# Patient Record
Sex: Male | Born: 1955 | Race: Black or African American | Hispanic: No | Marital: Single | Smoking: Never smoker
Health system: Southern US, Community
[De-identification: ages and names within clinical notes are randomized; demographics above are authoritative.]

## PROBLEM LIST (undated history)

## (undated) DIAGNOSIS — M1990 Unspecified osteoarthritis, unspecified site: Secondary | ICD-10-CM

## (undated) DIAGNOSIS — B9681 Helicobacter pylori [H. pylori] as the cause of diseases classified elsewhere: Secondary | ICD-10-CM

## (undated) DIAGNOSIS — IMO0001 Reserved for inherently not codable concepts without codable children: Secondary | ICD-10-CM

## (undated) DIAGNOSIS — D126 Benign neoplasm of colon, unspecified: Secondary | ICD-10-CM

## (undated) DIAGNOSIS — K2970 Gastritis, unspecified, without bleeding: Principal | ICD-10-CM

## (undated) DIAGNOSIS — I82629 Acute embolism and thrombosis of deep veins of unspecified upper extremity: Secondary | ICD-10-CM

## (undated) DIAGNOSIS — Z86718 Personal history of other venous thrombosis and embolism: Secondary | ICD-10-CM

## (undated) DIAGNOSIS — D3A00 Benign carcinoid tumor of unspecified site: Secondary | ICD-10-CM

## (undated) DIAGNOSIS — I4891 Unspecified atrial fibrillation: Secondary | ICD-10-CM

## (undated) DIAGNOSIS — I10 Essential (primary) hypertension: Secondary | ICD-10-CM

## (undated) HISTORY — DX: Acute embolism and thrombosis of deep veins of unspecified upper extremity: I82.629

## (undated) HISTORY — DX: Benign neoplasm of colon, unspecified: D12.6

## (undated) HISTORY — PX: OTHER SURGICAL HISTORY: SHX169

## (undated) HISTORY — DX: Helicobacter pylori (H. pylori) as the cause of diseases classified elsewhere: B96.81

## (undated) HISTORY — DX: Benign carcinoid tumor of unspecified site: D3A.00

## (undated) HISTORY — DX: Gastritis, unspecified, without bleeding: K29.70

---

## 1998-12-09 ENCOUNTER — Emergency Department (HOSPITAL_COMMUNITY): Admission: EM | Admit: 1998-12-09 | Discharge: 1998-12-09 | Payer: Self-pay | Admitting: Emergency Medicine

## 2002-03-26 ENCOUNTER — Ambulatory Visit (HOSPITAL_COMMUNITY): Admission: RE | Admit: 2002-03-26 | Discharge: 2002-03-26 | Payer: Self-pay | Admitting: Internal Medicine

## 2002-03-26 ENCOUNTER — Encounter: Payer: Self-pay | Admitting: Internal Medicine

## 2002-05-30 ENCOUNTER — Emergency Department (HOSPITAL_COMMUNITY): Admission: EM | Admit: 2002-05-30 | Discharge: 2002-05-30 | Payer: Self-pay

## 2002-09-08 ENCOUNTER — Ambulatory Visit (HOSPITAL_COMMUNITY): Admission: RE | Admit: 2002-09-08 | Discharge: 2002-09-08 | Payer: Self-pay | Admitting: Internal Medicine

## 2002-09-08 ENCOUNTER — Encounter: Payer: Self-pay | Admitting: Internal Medicine

## 2002-09-08 ENCOUNTER — Encounter (INDEPENDENT_AMBULATORY_CARE_PROVIDER_SITE_OTHER): Payer: Self-pay | Admitting: Specialist

## 2003-02-14 ENCOUNTER — Emergency Department (HOSPITAL_COMMUNITY): Admission: EM | Admit: 2003-02-14 | Discharge: 2003-02-14 | Payer: Self-pay | Admitting: Emergency Medicine

## 2003-02-14 ENCOUNTER — Encounter: Payer: Self-pay | Admitting: Emergency Medicine

## 2003-02-23 ENCOUNTER — Encounter: Payer: Self-pay | Admitting: Emergency Medicine

## 2003-02-23 ENCOUNTER — Emergency Department (HOSPITAL_COMMUNITY): Admission: EM | Admit: 2003-02-23 | Discharge: 2003-02-23 | Payer: Self-pay | Admitting: Emergency Medicine

## 2003-05-30 ENCOUNTER — Emergency Department (HOSPITAL_COMMUNITY): Admission: EM | Admit: 2003-05-30 | Discharge: 2003-05-30 | Payer: Self-pay | Admitting: Emergency Medicine

## 2003-06-01 ENCOUNTER — Emergency Department (HOSPITAL_COMMUNITY): Admission: EM | Admit: 2003-06-01 | Discharge: 2003-06-01 | Payer: Self-pay | Admitting: Emergency Medicine

## 2003-06-27 ENCOUNTER — Emergency Department (HOSPITAL_COMMUNITY): Admission: EM | Admit: 2003-06-27 | Discharge: 2003-06-27 | Payer: Self-pay | Admitting: Emergency Medicine

## 2003-07-01 ENCOUNTER — Emergency Department (HOSPITAL_COMMUNITY): Admission: EM | Admit: 2003-07-01 | Discharge: 2003-07-01 | Payer: Self-pay | Admitting: Emergency Medicine

## 2004-03-12 ENCOUNTER — Emergency Department (HOSPITAL_COMMUNITY): Admission: EM | Admit: 2004-03-12 | Discharge: 2004-03-13 | Payer: Self-pay | Admitting: Emergency Medicine

## 2004-04-10 ENCOUNTER — Emergency Department (HOSPITAL_COMMUNITY): Admission: EM | Admit: 2004-04-10 | Discharge: 2004-04-10 | Payer: Self-pay | Admitting: Emergency Medicine

## 2004-04-11 ENCOUNTER — Emergency Department (HOSPITAL_COMMUNITY): Admission: EM | Admit: 2004-04-11 | Discharge: 2004-04-11 | Payer: Self-pay | Admitting: Emergency Medicine

## 2004-05-09 ENCOUNTER — Inpatient Hospital Stay (HOSPITAL_COMMUNITY): Admission: EM | Admit: 2004-05-09 | Discharge: 2004-05-13 | Payer: Self-pay | Admitting: Psychiatry

## 2004-05-09 ENCOUNTER — Emergency Department (HOSPITAL_COMMUNITY): Admission: EM | Admit: 2004-05-09 | Discharge: 2004-05-09 | Payer: Self-pay | Admitting: Emergency Medicine

## 2009-10-30 HISTORY — PX: OTHER SURGICAL HISTORY: SHX169

## 2010-10-28 ENCOUNTER — Encounter
Admission: RE | Admit: 2010-10-28 | Discharge: 2010-10-28 | Payer: Self-pay | Source: Home / Self Care | Attending: Orthopedic Surgery | Admitting: Orthopedic Surgery

## 2011-01-07 ENCOUNTER — Inpatient Hospital Stay (HOSPITAL_COMMUNITY)
Admission: EM | Admit: 2011-01-07 | Discharge: 2011-01-10 | DRG: 301 | Disposition: A | Discharge status: Home / Self Care | Payer: Medicaid Other | Attending: Internal Medicine | Admitting: Internal Medicine

## 2011-01-07 ENCOUNTER — Emergency Department (HOSPITAL_COMMUNITY): Payer: Medicaid Other

## 2011-01-07 DIAGNOSIS — I82629 Acute embolism and thrombosis of deep veins of unspecified upper extremity: Principal | ICD-10-CM | POA: Diagnosis present

## 2011-01-07 DIAGNOSIS — M79609 Pain in unspecified limb: Secondary | ICD-10-CM

## 2011-01-07 DIAGNOSIS — M1990 Unspecified osteoarthritis, unspecified site: Secondary | ICD-10-CM | POA: Diagnosis present

## 2011-01-07 LAB — RAPID URINE DRUG SCREEN, HOSP PERFORMED
Benzodiazepines: NOT DETECTED
Cocaine: NOT DETECTED
Tetrahydrocannabinol: NOT DETECTED

## 2011-01-07 LAB — BASIC METABOLIC PANEL
CO2: 23 mEq/L (ref 19–32)
Chloride: 103 mEq/L (ref 96–112)
GFR calc Af Amer: >60 mL/min (ref >60)
GFR calc non Af Amer: >60 mL/min (ref >60)
Glucose, Bld: 156 mg/dL — ABNORMAL HIGH (ref 70–99)
Sodium: 136 mEq/L (ref 135–145)

## 2011-01-07 LAB — PROTIME-INR
INR: 0.95 (ref 0.00–1.49)
Prothrombin Time: 12.9 seconds (ref 11.6–15.2)

## 2011-01-07 LAB — CBC
MCH: 30.5 pg (ref 26.0–34.0)
Platelets: 239 10*3/uL (ref 150–400)
RBC: 4.72 MIL/uL (ref 4.22–5.81)
WBC: 5.3 10*3/uL (ref 4.0–10.5)

## 2011-01-07 LAB — DIFFERENTIAL
Basophils Absolute: 0 10*3/uL (ref 0.0–0.1)
Basophils Relative: 0 % (ref 0–1)
Lymphs Abs: 2.2 10*3/uL (ref 0.7–4.0)
Monocytes Absolute: 0.4 10*3/uL (ref 0.1–1.0)
Neutrophils Relative %: 49 % (ref 43–77)

## 2011-01-07 LAB — URINALYSIS, ROUTINE W REFLEX MICROSCOPIC
Glucose, UA: NEGATIVE mg/dL
Hgb urine dipstick: NEGATIVE
Nitrite: NEGATIVE
Protein, ur: NEGATIVE mg/dL
Urobilinogen, UA: 1 mg/dL (ref 0.0–1.0)

## 2011-01-07 LAB — APTT: aPTT: 27 seconds (ref 24–37)

## 2011-01-08 LAB — BASIC METABOLIC PANEL
CO2: 24 mEq/L (ref 19–32)
Calcium: 9 mg/dL (ref 8.4–10.5)
Chloride: 104 mEq/L (ref 96–112)
Creatinine, Ser: 0.8 mg/dL (ref 0.4–1.5)
GFR calc Af Amer: >60 mL/min (ref >60)

## 2011-01-08 LAB — CBC
MCV: 86.8 fL (ref 78.0–100.0)
Platelets: 230 10*3/uL (ref 150–400)
RBC: 4.54 MIL/uL (ref 4.22–5.81)
WBC: 4.2 10*3/uL (ref 4.0–10.5)

## 2011-01-08 LAB — PROTIME-INR
INR: 1.08 (ref 0.00–1.49)
Prothrombin Time: 14.2 seconds (ref 11.6–15.2)

## 2011-01-08 LAB — HOMOCYSTEINE: Homocysteine: 13.4 umol/L (ref 4.0–15.4)

## 2011-01-09 LAB — PROTEIN S ACTIVITY: Protein S Activity: 59 % — ABNORMAL LOW (ref 69–129)

## 2011-01-09 LAB — LUPUS ANTICOAGULANT PANEL
DRVVT: 36.2 secs (ref 36.2–44.3)
Lupus Anticoagulant: NOT DETECTED

## 2011-01-09 LAB — CARDIOLIPIN ANTIBODIES, IGG, IGM, IGA
Anticardiolipin IgA: 4 APL U/mL — ABNORMAL LOW (ref <22)
Anticardiolipin IgG: 4 GPL U/mL — ABNORMAL LOW (ref <23)
Anticardiolipin IgM: 2 MPL U/mL — ABNORMAL LOW (ref <11)

## 2011-01-09 LAB — BETA-2-GLYCOPROTEIN I ABS, IGG/M/A: Beta-2-Glycoprotein I IgM: 1 M Units (ref <20)

## 2011-01-09 LAB — ANTITHROMBIN III: AntiThromb III Func: 108 % (ref 76–126)

## 2011-01-09 LAB — PROTIME-INR
INR: 1.02 (ref 0.00–1.49)
Prothrombin Time: 13.6 seconds (ref 11.6–15.2)

## 2011-01-10 LAB — COMPREHENSIVE METABOLIC PANEL
AST: 47 U/L — ABNORMAL HIGH (ref 0–37)
BUN: 11 mg/dL (ref 6–23)
CO2: 26 mEq/L (ref 19–32)
Chloride: 103 mEq/L (ref 96–112)
Creatinine, Ser: 0.86 mg/dL (ref 0.4–1.5)
Glucose, Bld: 93 mg/dL (ref 70–99)
Potassium: 4.1 mEq/L (ref 3.5–5.1)
Total Bilirubin: 0.7 mg/dL (ref 0.3–1.2)

## 2011-01-10 LAB — CBC
Hemoglobin: 13.8 g/dL (ref 13.0–17.0)
MCH: 29.7 pg (ref 26.0–34.0)
RBC: 4.65 MIL/uL (ref 4.22–5.81)
WBC: 3.4 10*3/uL — ABNORMAL LOW (ref 4.0–10.5)

## 2011-01-10 LAB — DIFFERENTIAL
Basophils Relative: 0 % (ref 0–1)
Eosinophils Absolute: 0.1 10*3/uL (ref 0.0–0.7)
Neutro Abs: 1.1 10*3/uL — ABNORMAL LOW (ref 1.7–7.7)
Neutrophils Relative %: 32 % — ABNORMAL LOW (ref 43–77)

## 2011-01-10 LAB — PROTIME-INR: INR: 1.24 (ref 0.00–1.49)

## 2011-01-12 LAB — PROTEIN S, TOTAL: Protein S Ag, Total: 89 % (ref 70–140)

## 2011-01-17 NOTE — Discharge Summary (Signed)
  Dustin [name], Dustin [name]               ACCOUNT NO.:  [id number]  MEDICAL RECORD NO.:  [id number]           PATIENT TYPE:  I  LOCATION:  5504                         FACILITY:  [facility]  PHYSICIAN:  Pincus Large, MD     DATE OF BIRTH:  1956-08-25  DATE OF ADMISSION:  01/07/2011 DATE OF DISCHARGE:  01/10/2011                              DISCHARGE SUMMARY   PRIMARY CARE PHYSICIAN:  Unassigned.  REASON FOR ADMISSION:  Left arm swelling.  DISCHARGE DIAGNOSES: 1. Left upper extremity deep venous thrombosis. 2. History of osteoarthritis.  MEDICATIONS AT DISCHARGE: 1. Coumadin 2 mg daily. 2. Lovenox 95 mg subcu b.i.d. x7 days. 3. __________ daily. 4. Percocet 10/325 mg q.6 h. p.r.n.  IMAGES DONE:  Chest x-ray on January 07, 2011, shows no acute finding. Left upper extremity Doppler completed and there is an acute nonocclusive DVT noted on the left upper brachial vein.  BRIEF HOSPITAL COURSE:  Dustin [name] is a pleasant 55 year old gentleman with no significant past medical history other than just history of alcohol abuse presented to the hospital on the day of admission with left arm swelling stated he has noticed about several days ago what as an insect bite with focal pain.  Because of this, he came to the emergency room.  Denies any pain, shortness of breath, no headache. Venous Doppler shows left upper extremity DVT.  The patient does have a history of recent surgery to his left hand that was done as an outpatient.  The patient was started on Lovenox/Coumadin.  His INR is 1.24.  He is getting Lovenox 95 b.i.d.  We will send the patient on Lovenox/Coumadin to check his PT on January 13, 2011, with outside medical to decide if the patient needs more Lovenox or not.  Right now, his INR is 1.2.  PHYSICAL EXAMINATION:  VITAL SIGNS:  Temperature is 97.8, pulse of 75, respirations 20, blood pressure is 112/71, and 100% on room air. GENERAL:  He is alert, awake, and oriented x3, lying in bed,  in no acute distress. HEENT:  His head is atraumatic and normocephalic.  Eyes:  Pupils equal, round and reactive to light. HEART:  S1 and S2 normal.  No murmurs, gallops, or rubs. CHEST:  Bilateral air entry.  No wheezes or crackles. ABDOMEN:  Soft.  Bowel sounds present.  Guaiac were negative.  There is slight tenderness and swelling on the left upper extremity.  DISPOSITION:  Home.  RECOMMENDATIONS: 1. Continue with Lovenox/Coumadin. 2. Check PT/INR on January 13, 2011.  Follow up with his primary Ortho     as an outpatient basis.          ______________________________ Pincus Large, MD     SA/MEDQ  D:  01/10/2011  T:  01/11/2011  Job:  [id number]  Electronically Signed by Pincus Large MD on 01/17/2011 03:52:18 PM

## 2011-01-26 NOTE — H&P (Signed)
  Dustin [name], Dustin [name]               ACCOUNT NO.:  [id number]  MEDICAL RECORD NO.:  [id number]           PATIENT TYPE:  E  LOCATION:  [facility]                         FACILITY:  [facility]  PHYSICIAN:  Peggye Pitt, M.D. DATE OF BIRTH:  [birth year]  DATE OF ADMISSION:  01/07/2011 DATE OF DISCHARGE:                             HISTORY & PHYSICAL   PRIMARY CARE PHYSICIAN:  Unassigned.  CHIEF COMPLAINT:  Right arm swelling.  HISTORY OF PRESENT ILLNESS:  Dustin [name] is a pleasant 55 year old gentleman with no significant medical history other than prior history of cocaine and alcohol abuse, who presents to the hospital today with complaints of left arm swelling.  He states that he notices about several days ago and thought it was an insect bite.  It is quite painful and because of this, he came to the emergency department today.  He denies any chest pain, any shortness of breath, any headaches, any blurred or double vision, any nausea or vomiting, or any other symptoms.  ALLERGIES:  He has no known drug allergies.  PAST MEDICAL HISTORY:  Significant for prior history of cocaine and tobacco abuse, says he quit around 2006.  HOME MEDICATIONS:  None.  SOCIAL HISTORY:  He denies any recent tobacco, alcohol, or illicit drug use.  FAMILY HISTORY:  Not significant for coronary artery disease, cancer, strokes, heart attacks, or blood clots.  REVIEW OF SYSTEMS:  Negative except as mentioned in history of present illness.  PHYSICAL EXAMINATION:  VITAL SIGNS:  On admission, blood pressure 157/86, heart rate 86, respirations 20, sats 98% on room air, and temperature 98.3. GENERAL:  He is alert, awake, and oriented x3, in no distress. HEENT:  Normocephalic and atraumatic.  His pupils are equally round and reactive to light.  He has intact extraocular movements.  He does have some red maculas in his oropharynx. NECK:  Supple.  No JVD.  No lymphadenopathy.  No bruits.  No goiters. HEART:   Regular rate and rhythm with no murmurs, rubs, or gallops. LUNGS:  Clear to auscultation bilaterally. ABDOMEN:  Soft, nontender, and nondistended with positive bowel sounds. EXTREMITIES:  He has no edema with positive pedal pulses.  Over his right upper arm on the lateral aspect, he does have an induration that is about 5 x 8 cm. NEUROLOGIC:  Grossly intact and nonfocal.  LABORATORY DATA:  All labs and x-rays are pending at this time.  ASSESSMENT AND PLAN:  Left upper extremity deep vein thrombosis.  We will admit him to the hospital for Lovenox and Coumadin.  He will need at least 3 months of Coumadin.  I will order hypercoagulable panel, as he has no obvious risk factors for venous thromboembolism.  He has not been any recent long plane or car trips.  He will need a PCP for INR followups and we will ask case management for further assistance in this manner.     Peggye Pitt, M.D.     EH/MEDQ  D:  01/07/2011  T:  01/07/2011  Job:  [id number]  Electronically Signed by Peggye Pitt M.D. on 01/26/2011 07:47:55 AM

## 2011-03-17 NOTE — Discharge Summary (Signed)
NAME:  Dustin [name], Dustin [name]                         ACCOUNT NO.:  [id number]   MEDICAL RECORD NO.:  [id number]                   PATIENT TYPE:  IPS   LOCATION:  0303                                 FACILITY:  [facility]   PHYSICIAN:  Jeanice Lim, M.D.              DATE OF BIRTH:  02-14-1956   DATE OF ADMISSION:  05/09/2004  DATE OF DISCHARGE:  05/13/2004                                 DISCHARGE SUMMARY   IDENTIFYING DATA:  This is a 55 year old African-American male, single,  voluntarily admitted, who presented to the emergency room requesting to get  off cocaine and alcohol.  Was tremulous with auditory and visual  hallucinations and full-blown withdrawal.  Had been on a three-day binge,  using alcohol and a large amount of cocaine, and had been on a binge  pattern, often daily drinking since his 30s and recent escalation of use.  Was admitted to [facility] for two weeks in June for both  suicidal ideation and polysubstance abuse.  Had been binging on a roll for  multiple days, drinking 10-12 40-ounce beers in addition to cocaine, last  using the day prior to admission.  The longest period of sobriety was 30  days while in jail.  This is the first admission to [facility].  Again, had been in  [place] for two weeks and the second total psychiatric admission.  No  primary care physician.   MEDICATIONS:  None.   No known drug allergies.   Physical exam and neurologic exam within normal limits.   Routine admission lab studies within normal limits.   MENTAL STATUS EXAM:  Fully alert, pleasant, cooperative, with good eye  contact and no psychomotor abnormality.  Speech soft-spoken.  Mood euthymic.  Affect full without __________, goal-directed.  Cognition was intact.  No  psychotic symptoms or dangerous ideation.  Cognitively, again, intact, and  appeared to be candid about substance use and motivation to get treatment.   ADMISSION DIAGNOSES:   AXIS I:  1. Alcohol dependence  and cocaine dependence in binge pattern.  2. Partial withdrawal syndrome.   AXIS II:  Deferred.   AXIS III:  None.   AXIS IV:  Moderate stressors to severe, recent homelessness or unclear  living situation, other psychosocial issues.   AXIS V:  29/55-60.   The patient was admitted and ordered routine care and medications and  withdrawal monitoring.  He was encouraged to participate in individual,  group, and milieu therapy.  He was placed on a Librium detox protocol,  Symmetrel for cocaine cravings.  Thyroid was monitored and elevation of  liver enzymes likely from alcoholic hepatitis were followed, as well as the  patient was encouraged to participate in substance abuse therapy and develop  a relapse prevention plan in addition to other aftercare planning.  Once he  was safely detoxed after close monitoring, the patient's __________ was  monitored and he required p.r.n.  medications.  He admitted to being out of  control and felt like killing himself prior to admission.  He did complain  of cravings as well as withdrawal symptoms, and gradually suicidal ideation  as well as withdrawal symptoms and cravings improved.  The patient was  discharged in improved condition with mood euthymic, no acute withdrawal  symptoms, increased insight and judgment, motivated to remain abstinent,  having developed an adequate relapse prevention plan, participating in the  aftercare planning.  He was given medication education, again discharged  with no dangerous ideation or psychotic symptoms and no acute withdrawal  symptoms or side effects from the medications.  After medication education  reviewed, he was discharged on trazodone 100 mg one q.h.s., and amantadine  100 mg one b.i.d.  These medications were to be re-evaluated by follow-up  physician after a further period of sobriety.  He was to follow up with the  [facility] and seek all substance abuse treatment resources  available in  this area.   DISCHARGE DIAGNOSES:   AXIS I:  1. Alcohol dependence and cocaine dependence in binge pattern.  2. Partial withdrawal syndrome.   AXIS II:  Deferred.   AXIS III:  None.   AXIS IV:  Moderate stressors to severe, recent homelessness or unclear  living situation, other psychosocial issues.   AXIS V:  GAF on discharge:  50-55.                                               Jeanice Lim, M.D.    JEM/MEDQ  D:  06/15/2004  T:  06/17/2004  Job:  [id number]

## 2011-09-06 ENCOUNTER — Emergency Department (HOSPITAL_COMMUNITY)
Admission: EM | Admit: 2011-09-06 | Discharge: 2011-09-06 | Disposition: A | Discharge status: Home / Self Care | Payer: Medicare Other | Attending: Emergency Medicine | Admitting: Emergency Medicine

## 2011-09-06 DIAGNOSIS — M7989 Other specified soft tissue disorders: Secondary | ICD-10-CM | POA: Insufficient documentation

## 2011-09-06 DIAGNOSIS — L0291 Cutaneous abscess, unspecified: Secondary | ICD-10-CM

## 2011-09-06 DIAGNOSIS — IMO0002 Reserved for concepts with insufficient information to code with codable children: Secondary | ICD-10-CM | POA: Insufficient documentation

## 2011-09-06 DIAGNOSIS — M25429 Effusion, unspecified elbow: Secondary | ICD-10-CM | POA: Insufficient documentation

## 2011-09-06 DIAGNOSIS — M79609 Pain in unspecified limb: Secondary | ICD-10-CM | POA: Insufficient documentation

## 2011-09-06 DIAGNOSIS — M129 Arthropathy, unspecified: Secondary | ICD-10-CM | POA: Insufficient documentation

## 2011-09-06 HISTORY — DX: Unspecified osteoarthritis, unspecified site: M19.90

## 2011-09-06 HISTORY — DX: Personal history of other venous thrombosis and embolism: Z86.718

## 2011-09-06 MED ORDER — OXYCODONE-ACETAMINOPHEN 5-325 MG PO TABS
1.0000 | ORAL_TABLET | Freq: Once | ORAL | Status: AC
  Administered 2011-09-06: 1 via ORAL
  Filled 2011-09-06: qty 1

## 2011-09-06 MED ORDER — SULFAMETHOXAZOLE-TRIMETHOPRIM 800-160 MG PO TABS: 1.0000 | ORAL_TABLET | Freq: Two times a day (BID) | ORAL | Status: AC

## 2011-09-06 MED ORDER — OXYCODONE-ACETAMINOPHEN 5-325 MG PO TABS: 1.0000 | ORAL_TABLET | ORAL | Status: AC | PRN

## 2011-09-06 NOTE — ED Notes (Signed)
Pa  At bedside to I&D abcess

## 2011-09-06 NOTE — ED Provider Notes (Signed)
Medical screening examination/treatment/procedure(s) were performed by non-physician practitioner and as supervising physician I was immediately available for consultation/collaboration.  Doug Sou, MD 09/06/11 2019

## 2011-09-06 NOTE — ED Provider Notes (Signed)
History     CSN: [id number] Arrival date & time: 09/06/2011 12:49 PM   First MD Initiated Contact with Patient 09/06/11 1418      Chief Complaint  Patient presents with  . Abscess    abcess of the rt. elbow    (Consider location/radiation/quality/duration/timing/severity/associated sxs/prior treatment) Patient is a 55 y.o. male presenting with abscess. The history is provided by the patient.  Abscess  This is a new problem. The current episode started yesterday. The onset was gradual. The abscess is present on the left arm. The problem is moderate. The abscess is characterized by swelling. Pertinent negatives include no fever.    Past Medical History  Diagnosis Date  . History of blood clots   . Arthritis     History reviewed. No pertinent past surgical history.  History reviewed. No pertinent family history.  History  Substance Use Topics  . Smoking status: Former Games developer  . Smokeless tobacco: Not on file  . Alcohol Use: No      Review of Systems  Constitutional: Negative for fever.  Skin:       See HPI.    Allergies  Review of patient's allergies indicates no known allergies.  Home Medications  No current outpatient prescriptions on file.  BP 137/86  Pulse 103  Temp 98.3 F (36.8 C)  Resp 16  SpO2 97%  Physical Exam  Constitutional: He appears well-developed and well-nourished.  Musculoskeletal:       Left proximal forearm swollen posteriorly, red, tender. No ulceration or llesion.     ED Course  INCISION AND DRAINAGE Date/Time: 09/06/2011 3:46 PM Performed by: Langley Adie A Authorized by: Langley Adie A Consent: Verbal consent obtained. Consent given by: patient Patient understanding: patient states understanding of the procedure being performed Site marked: the operative site was marked Imaging studies: imaging studies not available Patient identity confirmed: verbally with patient Time out: Immediately prior to procedure a "time out"  was called to verify the correct patient, procedure, equipment, support staff and site/side marked as required. Type: abscess Body area: upper extremity Location details: left arm Anesthesia: local infiltration Local anesthetic: lidocaine 1% without epinephrine Scalpel size: 11 Incision type: single straight Complexity: simple Drainage: bloody Drainage amount: scant Wound treatment: wound left open Packing material: 1/4 in iodoform gauze Patient tolerance: Patient tolerated the procedure well with no immediate complications.   (including critical care time)  Labs Reviewed - No data to display No results found.   No diagnosis found.    MDM          Rodena Medin, PA 09/06/11 [phone]

## 2011-09-06 NOTE — ED Notes (Signed)
Pt states noticed 2 days ago had pain, redness and swelling to left posterior proximal forearm....area is approx 10 X 10 cm..   No visible drainage....  Appears to have a "bite/tiny puncture" wound in middle of area...   Area marked and dated      Area is hot and  indurated

## 2011-09-08 ENCOUNTER — Emergency Department (INDEPENDENT_AMBULATORY_CARE_PROVIDER_SITE_OTHER)
Admission: EM | Admit: 2011-09-08 | Discharge: 2011-09-08 | Disposition: A | Discharge status: Home / Self Care | Payer: Medicare Other | Source: Home / Self Care

## 2011-09-08 ENCOUNTER — Encounter (HOSPITAL_COMMUNITY): Payer: Self-pay | Admitting: Cardiology

## 2011-09-08 DIAGNOSIS — L0291 Cutaneous abscess, unspecified: Secondary | ICD-10-CM

## 2011-09-08 DIAGNOSIS — L0390 Cellulitis, unspecified: Secondary | ICD-10-CM

## 2011-09-08 MED ORDER — MUPIROCIN CALCIUM 2 % EX CREA: TOPICAL_CREAM | Freq: Three times a day (TID) | CUTANEOUS | Status: AC

## 2011-09-08 MED ORDER — LIDOCAINE HCL (PF) 1 % IJ SOLN
INTRAMUSCULAR | Status: AC
  Administered 2011-09-08: 12:00:00 via INTRAMUSCULAR
  Filled 2011-09-08: qty 5

## 2011-09-08 MED ORDER — CEFTRIAXONE SODIUM 1 G IJ SOLR
INTRAMUSCULAR | Status: AC
  Filled 2011-09-08: qty 10

## 2011-09-08 MED ORDER — CEFTRIAXONE SODIUM 1 G IJ SOLR
1.0000 g | Freq: Once | INTRAMUSCULAR | Status: AC
  Administered 2011-09-08: 1 g via INTRAMUSCULAR

## 2011-09-08 NOTE — ED Provider Notes (Signed)
History     CSN: [id number] Arrival date & time: 09/08/2011  9:57 AM   First MD Initiated Contact with Patient 09/08/11 1110      Chief Complaint  Patient presents with  . Wound Check    packing removal    (Consider location/radiation/quality/duration/timing/severity/associated sxs/prior treatment) Patient is a 55 y.o. male presenting with abscess. The history is provided by the patient.  Abscess  This is a new problem. Episode onset: Patient had I&D at [facility] ED on 11/07 hast taken 3 pills of septra,  no cultures pending, reports pain and  swelling better but redness worse and outside dermographic markings, no fever or chills. Progression since onset: minimally improved. The abscess is present on the left arm. The problem is moderate. Pertinent negatives include no fever.    Past Medical History  Diagnosis Date  . History of blood clots     to left arm  . Arthritis     History reviewed. No pertinent past surgical history.  Family History  Problem Relation Age of Onset  . Diabetes Other   . Hypertension Other     History  Substance Use Topics  . Smoking status: Former Games developer  . Smokeless tobacco: Not on file  . Alcohol Use: No      Review of Systems  Constitutional: Negative for fever and chills.  Skin: Positive for wound.       Erythema, tenderness and purulent discharge.    Allergies  Review of patient's allergies indicates no known allergies.  Home Medications   Current Outpatient Rx  Name Route Sig Dispense Refill  . OXYCODONE-ACETAMINOPHEN 5-325 MG PO TABS Oral Take 1 tablet by mouth every 4 (four) hours as needed for pain. 15 tablet 0  . SULFAMETHOXAZOLE-TRIMETHOPRIM 800-160 MG PO TABS Oral Take 1 tablet by mouth every 12 (twelve) hours. 20 tablet 0  . MUPIROCIN CALCIUM 2 % EX CREA Topical Apply topically 3 (three) times daily. 15 g 0    BP 144/78  Pulse 80  Temp(Src) 99 F (37.2 C) (Oral)  Resp 16  SpO2 99%  Physical Exam  Nursing note and  vitals reviewed. Constitutional: He is oriented to person, place, and time. He appears well-developed and well-nourished. No distress.  Cardiovascular: Normal rate and regular rhythm.   Neurological: He is alert and oriented to person, place, and time.  Skin:       There is an abscess with surrounding cellulitis s/p I&D in left lateral forearm proximal to the elbow joint.The I&D incision is very small <16mm and appears insufficient there is still purulent drainage and tenderness to palpation, erythema around is flat blanchable with no induration of the skin but  has surpassed the dermographic pen markings done in prior visit; although appears fading and does no have tracking.  Adjacent elbow joint has FROM with no reported tenderness and there is no obvious joint effusion or swelling.      ED Course  INCISION AND DRAINAGE Date/Time: 09/08/2011 12:23 PM Performed by: Sharin Grave Authorized by: Sharin Grave Consent: Verbal consent obtained. Risks and benefits: risks, benefits and alternatives were discussed Consent given by: patient Patient understanding: patient states understanding of the procedure being performed Patient consent: the patient's understanding of the procedure matches consent given Patient identity confirmed: verbally with patient Type: abscess Body area: upper extremity Location details: left arm Anesthesia: local infiltration Local anesthetic: lidocaine 1% without epinephrine Anesthetic total: 2 ml Patient sedated: no Scalpel size: 11 Incision type: extension of prior incition to  1.5cm and eliptic removal of borders to avoid fast closure. Complexity: simple Drainage: purulent Drainage amount: moderate Wound treatment: wound left open Packing material: sterile dressing with neomycin ointment applyed. Patient tolerance: Patient tolerated the procedure well with no immediate complications.   (including critical care time)   Labs Reviewed  WOUND  CULTURE   No results found.   1. Abscess and cellulitis       MDM  Patient had a second I&D with extension of prior incision, pus drainage, culture although patient on second day of septra. Rocephin 1g IM  Was administered asked to continue Septra and return in 24-48 hours the sooner if increased redness, swelling drainage or pain, or elbow joint pain or swelling.        Abeni Finchum Moreno-Coll 09/09/11 4098

## 2011-09-08 NOTE — ED Notes (Signed)
Pt returns for wound check. He did change his dressing yesterday wit packing removal. Pt reports wound to be painful and redness has gone past the mark placed on the arm on this past Tuesday. Wound noted to have yellow drainage also.

## 2011-09-10 ENCOUNTER — Emergency Department (HOSPITAL_COMMUNITY)
Admission: EM | Admit: 2011-09-10 | Discharge: 2011-09-10 | Disposition: A | Discharge status: Other Acute Inpatient Hospital | Payer: Medicare Other | Source: Home / Self Care

## 2011-09-10 NOTE — ED Notes (Signed)
Patient came in for abscess recheck.  Patient was advised of the wait time by registration staff and became very agitated.  Patient advised we would be more than happy to see him but we couldn't make him stay.  Patient yelled he did not have to be here repeatedly and left the building

## 2011-09-11 LAB — WOUND CULTURE

## 2011-09-12 ENCOUNTER — Telehealth (HOSPITAL_COMMUNITY): Payer: Self-pay | Admitting: *Deleted

## 2011-09-12 NOTE — ED Notes (Signed)
Chart and lab reviewed. Pt. pos. for MRSA and was adequately treated with Sulfa. Vassie Moselle 09/12/2011

## 2011-10-06 ENCOUNTER — Encounter (HOSPITAL_COMMUNITY): Payer: Self-pay | Admitting: Pharmacy Technician

## 2011-10-11 ENCOUNTER — Other Ambulatory Visit: Payer: Self-pay

## 2011-10-11 ENCOUNTER — Encounter (HOSPITAL_COMMUNITY)
Admission: RE | Admit: 2011-10-11 | Discharge: 2011-10-11 | Disposition: A | Discharge status: Home / Self Care | Payer: Medicare Other | Source: Ambulatory Visit | Attending: Orthopedic Surgery | Admitting: Orthopedic Surgery

## 2011-10-11 ENCOUNTER — Encounter (HOSPITAL_COMMUNITY): Payer: Self-pay

## 2011-10-11 LAB — URINALYSIS, ROUTINE W REFLEX MICROSCOPIC
Leukocytes, UA: NEGATIVE
Nitrite: NEGATIVE
Specific Gravity, Urine: 1.031 — ABNORMAL HIGH (ref 1.005–1.030)
Urobilinogen, UA: 1 mg/dL (ref 0.0–1.0)
pH: 5.5 (ref 5.0–8.0)

## 2011-10-11 LAB — COMPREHENSIVE METABOLIC PANEL
ALT: 44 U/L (ref 0–53)
Albumin: 4.3 g/dL (ref 3.5–5.2)
Alkaline Phosphatase: 68 U/L (ref 39–117)
BUN: 26 mg/dL — ABNORMAL HIGH (ref 6–23)
Chloride: 103 mEq/L (ref 96–112)
Potassium: 4.2 mEq/L (ref 3.5–5.1)
Sodium: 139 mEq/L (ref 135–145)
Total Bilirubin: 1.1 mg/dL (ref 0.3–1.2)
Total Protein: 8.3 g/dL (ref 6.0–8.3)

## 2011-10-11 LAB — CBC
HCT: 44 % (ref 39.0–52.0)
Hemoglobin: 15.1 g/dL (ref 13.0–17.0)
MCHC: 34.3 g/dL (ref 30.0–36.0)
RDW: 13.4 % (ref 11.5–15.5)
WBC: 4.5 10*3/uL (ref 4.0–10.5)

## 2011-10-11 LAB — PROTIME-INR: Prothrombin Time: 13.7 seconds (ref 11.6–15.2)

## 2011-10-11 LAB — TYPE AND SCREEN: Antibody Screen: NEGATIVE

## 2011-10-11 LAB — SURGICAL PCR SCREEN
MRSA, PCR: POSITIVE — AB
Staphylococcus aureus: POSITIVE — AB

## 2011-10-11 LAB — ABO/RH: ABO/RH(D): A POS

## 2011-10-11 LAB — APTT: aPTT: 28 seconds (ref 24–37)

## 2011-10-11 NOTE — Pre-Procedure Instructions (Signed)
20 TREYSEAN PETRUZZI  10/11/2011   Your procedure is scheduled on: Thurs,Dec 20 @ 0730  Report to [facility] at 0530 AM.  Call this number if you have problems the morning of surgery: [phone]   Remember:   Do not eat food:After Midnight.  May have clear liquids: up to 4 Hours before arrival.(until 1:30am)  Clear liquids include soda, tea, black coffee, apple or grape juice, broth.  Take these medicines the morning of surgery with A SIP OF WATER: Pain Pill(if needed)   Do not wear jewelry, make-up or nail polish.  Do not wear lotions, powders, or perfumes. You may wear deodorant.  Do not shave 48 hours prior to surgery.  Do not bring valuables to the hospital.  Contacts, dentures or bridgework may not be worn into surgery.  Leave suitcase in the car. After surgery it may be brought to your room.  For patients admitted to the hospital, checkout time is 11:00 AM the day of discharge.   Patients discharged the day of surgery will not be allowed to drive home.  Name and phone number of your driver:   Special Instructions: CHG Shower Use Special Wash: 1/2 bottle night before surgery and 1/2 bottle morning of surgery.   Please read over the following fact sheets that you were given: Pain Booklet, Coughing and Deep Breathing, Blood Transfusion Information, Total Joint Packet, MRSA Information and Surgical Site Infection Prevention

## 2011-10-13 ENCOUNTER — Encounter (HOSPITAL_COMMUNITY): Payer: Self-pay | Admitting: Vascular Surgery

## 2011-10-13 NOTE — Consult Note (Signed)
Anesthesia:  Patient is a 55 year old male scheduled for a left TKA on 10/19/11.  His history includes a non-occlusive left brachial vein DVT in March of this year.  This was following an out-patient left hand surgery.  He is also a former smoker and has a history of cocaine and ETOH abuse, but quit in 2005.  His labs are acceptable for the OR from an Anesthesia standpoint.  His CXR showed no acute process.  I was asked to review his preoperative EKG showing NSR with possible prior septal infarct and ST abnormality in the inferior leads.  Probably non-specific ST changes in lateral leads V4-6.  These changes are somewhat more apparent than on his EKG from 01/07/11, but still seem overall stable.   I called Dustin [name].  He was a somewhat poor historian.  He sees a PCP at [facility] in [place].   He reports he has already completed anticoagulation therapy for his LUE DVT.  His prior hand surgery to remove a mass earlier this year was done at a clinic on [address]. He denies CP, SOB, palpitations, and LE edema other than around his knees.  He has no known hx of CAD/MI or DM.  He denies history of HTN although his BP was 149/87 at PAT.  He is not particularly active secondary to knee pain.  He has had no known prior cardiac studies.  I reviewed above, including his last two EKGs, with Dr. Jean Rosenthal.  Plan to proceed if remains asymptomatic.

## 2011-10-18 MED ORDER — CEFAZOLIN SODIUM-DEXTROSE 2-3 GM-% IV SOLR
2.0000 g | INTRAVENOUS | Status: AC
  Administered 2011-10-19: 2 g via INTRAVENOUS
  Filled 2011-10-18: qty 50

## 2011-10-19 ENCOUNTER — Encounter (HOSPITAL_COMMUNITY): Payer: Self-pay | Admitting: Vascular Surgery

## 2011-10-19 ENCOUNTER — Encounter (HOSPITAL_COMMUNITY): Payer: Self-pay | Admitting: *Deleted

## 2011-10-19 ENCOUNTER — Encounter (HOSPITAL_COMMUNITY)
Admission: RE | Disposition: A | Discharge status: Home Health | Payer: Self-pay | Source: Ambulatory Visit | Attending: Orthopedic Surgery

## 2011-10-19 ENCOUNTER — Inpatient Hospital Stay (HOSPITAL_COMMUNITY)
Admission: RE | Admit: 2011-10-19 | Discharge: 2011-10-23 | DRG: 470 | Disposition: A | Discharge status: Home Health | Payer: Medicare Other | Source: Ambulatory Visit | Attending: Orthopedic Surgery | Admitting: Orthopedic Surgery

## 2011-10-19 ENCOUNTER — Encounter (HOSPITAL_COMMUNITY): Payer: Self-pay

## 2011-10-19 ENCOUNTER — Inpatient Hospital Stay (HOSPITAL_COMMUNITY): Payer: Medicare Other | Admitting: Vascular Surgery

## 2011-10-19 DIAGNOSIS — M1710 Unilateral primary osteoarthritis, unspecified knee: Principal | ICD-10-CM | POA: Diagnosis present

## 2011-10-19 DIAGNOSIS — Z01812 Encounter for preprocedural laboratory examination: Secondary | ICD-10-CM

## 2011-10-19 DIAGNOSIS — Z96659 Presence of unspecified artificial knee joint: Secondary | ICD-10-CM

## 2011-10-19 HISTORY — PX: TOTAL KNEE ARTHROPLASTY: SHX125

## 2011-10-19 SURGERY — ARTHROPLASTY, KNEE, TOTAL
Anesthesia: General | Site: Knee | Laterality: Left | Wound class: Clean

## 2011-10-19 MED ORDER — PROPOFOL 10 MG/ML IV EMUL
INTRAVENOUS | Status: DC | PRN
  Administered 2011-10-19: 130 mg via INTRAVENOUS

## 2011-10-19 MED ORDER — HYDROMORPHONE 0.3 MG/ML IV SOLN
INTRAVENOUS | Status: AC
  Administered 2011-10-19: 0.799 mg via INTRAVENOUS
  Administered 2011-10-19: 0.6 mg via INTRAVENOUS
  Administered 2011-10-19: 0.3 mg via INTRAVENOUS
  Administered 2011-10-20: 0.799 mg via INTRAVENOUS
  Administered 2011-10-20: 0.599 mg via INTRAVENOUS
  Administered 2011-10-20: 18:00:00 via INTRAVENOUS
  Administered 2011-10-20: 0.599 mg via INTRAVENOUS
  Administered 2011-10-20: 1.19 mg via INTRAVENOUS
  Administered 2011-10-20: 1.79 mg via INTRAVENOUS
  Administered 2011-10-20: 0.799 mg via INTRAVENOUS
  Administered 2011-10-20: 1.39 mg via INTRAVENOUS
  Administered 2011-10-21: 1.19 mg via INTRAVENOUS
  Administered 2011-10-21: 2.19 mg via INTRAVENOUS
  Filled 2011-10-19 (×2): qty 25

## 2011-10-19 MED ORDER — SODIUM CHLORIDE 0.9 % IJ SOLN: 9.0000 mL | INTRAMUSCULAR | Status: DC | PRN

## 2011-10-19 MED ORDER — METOCLOPRAMIDE HCL 5 MG PO TABS
5.0000 mg | ORAL_TABLET | Freq: Three times a day (TID) | ORAL | Status: DC | PRN
  Filled 2011-10-19: qty 2

## 2011-10-19 MED ORDER — WARFARIN VIDEO
Freq: Once | Status: AC
  Administered 2011-10-19: 15:00:00

## 2011-10-19 MED ORDER — PROMETHAZINE HCL 25 MG/ML IJ SOLN: 6.2500 mg | INTRAMUSCULAR | Status: DC | PRN

## 2011-10-19 MED ORDER — ACETAMINOPHEN 650 MG RE SUPP: 650.0000 mg | Freq: Four times a day (QID) | RECTAL | Status: DC | PRN

## 2011-10-19 MED ORDER — BUPIVACAINE-EPINEPHRINE PF 0.5-1:200000 % IJ SOLN
INTRAMUSCULAR | Status: DC | PRN
  Administered 2011-10-19: 30 mL

## 2011-10-19 MED ORDER — METOCLOPRAMIDE HCL 5 MG/ML IJ SOLN
5.0000 mg | Freq: Three times a day (TID) | INTRAMUSCULAR | Status: DC | PRN
  Filled 2011-10-19: qty 2

## 2011-10-19 MED ORDER — LACTATED RINGERS IV SOLN
INTRAVENOUS | Status: DC | PRN
  Administered 2011-10-19 (×2): via INTRAVENOUS

## 2011-10-19 MED ORDER — SUFENTANIL CITRATE 50 MCG/ML IV SOLN
INTRAVENOUS | Status: DC | PRN
  Administered 2011-10-19 (×4): 10 ug via INTRAVENOUS
  Administered 2011-10-19: 30 ug via INTRAVENOUS

## 2011-10-19 MED ORDER — ONDANSETRON HCL 4 MG/2ML IJ SOLN: 4.0000 mg | Freq: Four times a day (QID) | INTRAMUSCULAR | Status: DC | PRN

## 2011-10-19 MED ORDER — NALOXONE HCL 0.4 MG/ML IJ SOLN: 0.4000 mg | INTRAMUSCULAR | Status: DC | PRN

## 2011-10-19 MED ORDER — ACETAMINOPHEN 325 MG PO TABS
650.0000 mg | ORAL_TABLET | Freq: Four times a day (QID) | ORAL | Status: DC | PRN
  Administered 2011-10-20: 650 mg via ORAL
  Filled 2011-10-19: qty 2

## 2011-10-19 MED ORDER — DROPERIDOL 2.5 MG/ML IJ SOLN
INTRAMUSCULAR | Status: DC | PRN
  Administered 2011-10-19: 0.625 mg via INTRAVENOUS

## 2011-10-19 MED ORDER — MEPERIDINE HCL 25 MG/ML IJ SOLN: 6.2500 mg | INTRAMUSCULAR | Status: DC | PRN

## 2011-10-19 MED ORDER — DIPHENHYDRAMINE HCL 50 MG/ML IJ SOLN: 12.5000 mg | Freq: Four times a day (QID) | INTRAMUSCULAR | Status: DC | PRN

## 2011-10-19 MED ORDER — ONDANSETRON HCL 4 MG PO TABS: 4.0000 mg | ORAL_TABLET | Freq: Four times a day (QID) | ORAL | Status: DC | PRN

## 2011-10-19 MED ORDER — ONDANSETRON HCL 4 MG/2ML IJ SOLN
INTRAMUSCULAR | Status: DC | PRN
  Administered 2011-10-19: 4 mg via INTRAVENOUS

## 2011-10-19 MED ORDER — MIDAZOLAM HCL 5 MG/5ML IJ SOLN
INTRAMUSCULAR | Status: DC | PRN
  Administered 2011-10-19: 2 mg via INTRAVENOUS

## 2011-10-19 MED ORDER — SODIUM CHLORIDE 0.9 % IR SOLN
Status: DC | PRN
  Administered 2011-10-19: 3000 mL

## 2011-10-19 MED ORDER — MENTHOL 3 MG MT LOZG: 1.0000 | LOZENGE | OROMUCOSAL | Status: DC | PRN

## 2011-10-19 MED ORDER — PATIENT'S GUIDE TO USING COUMADIN BOOK
Freq: Once | Status: AC
  Administered 2011-10-19: 15:00:00
  Filled 2011-10-19: qty 1

## 2011-10-19 MED ORDER — DEXTROSE-NACL 5-0.45 % IV SOLN
INTRAVENOUS | Status: AC
  Administered 2011-10-19 – 2011-10-20 (×2): via INTRAVENOUS

## 2011-10-19 MED ORDER — PHENOL 1.4 % MT LIQD
1.0000 | OROMUCOSAL | Status: DC | PRN
  Filled 2011-10-19: qty 177

## 2011-10-19 MED ORDER — CEFAZOLIN SODIUM 1-5 GM-% IV SOLN
1.0000 g | Freq: Four times a day (QID) | INTRAVENOUS | Status: AC
  Administered 2011-10-19 – 2011-10-20 (×3): 1 g via INTRAVENOUS
  Filled 2011-10-19 (×3): qty 50

## 2011-10-19 MED ORDER — DIPHENHYDRAMINE HCL 12.5 MG/5ML PO ELIX
12.5000 mg | ORAL_SOLUTION | Freq: Four times a day (QID) | ORAL | Status: DC | PRN
  Filled 2011-10-19: qty 5

## 2011-10-19 MED ORDER — WARFARIN SODIUM 10 MG PO TABS
10.0000 mg | ORAL_TABLET | Freq: Once | ORAL | Status: AC
  Administered 2011-10-19: 10 mg via ORAL
  Filled 2011-10-19: qty 1

## 2011-10-19 MED ORDER — ROCURONIUM BROMIDE 100 MG/10ML IV SOLN
INTRAVENOUS | Status: DC | PRN
  Administered 2011-10-19: 50 mg via INTRAVENOUS

## 2011-10-19 MED ORDER — ACETAMINOPHEN 10 MG/ML IV SOLN
INTRAVENOUS | Status: AC
  Filled 2011-10-19: qty 100

## 2011-10-19 MED ORDER — ACETAMINOPHEN 10 MG/ML IV SOLN
INTRAVENOUS | Status: DC | PRN
  Administered 2011-10-19: 1000 mg via INTRAVENOUS

## 2011-10-19 MED ORDER — HYDROMORPHONE HCL PF 1 MG/ML IJ SOLN
INTRAMUSCULAR | Status: AC
  Filled 2011-10-19: qty 1

## 2011-10-19 MED ORDER — DEXAMETHASONE SODIUM PHOSPHATE 4 MG/ML IJ SOLN
INTRAMUSCULAR | Status: DC | PRN
  Administered 2011-10-19: 4 mg via INTRAVENOUS

## 2011-10-19 MED ORDER — HYDROMORPHONE HCL PF 1 MG/ML IJ SOLN
0.2500 mg | INTRAMUSCULAR | Status: DC | PRN
  Administered 2011-10-19 (×6): 0.5 mg via INTRAVENOUS

## 2011-10-19 SURGICAL SUPPLY — 49 items
BANDAGE ELASTIC 4 VELCRO ST LF (GAUZE/BANDAGES/DRESSINGS) ×2 IMPLANT
BANDAGE ESMARK 6X9 LF (GAUZE/BANDAGES/DRESSINGS) ×1 IMPLANT
BANDAGE GAUZE ELAST BULKY 4 IN (GAUZE/BANDAGES/DRESSINGS) ×2 IMPLANT
BLADE SAGITTAL 25.0X1.27X90 (BLADE) ×2 IMPLANT
BNDG CMPR 9X6 STRL LF SNTH (GAUZE/BANDAGES/DRESSINGS) ×1
BNDG CMPR MED 10X6 ELC LF (GAUZE/BANDAGES/DRESSINGS) ×1
BNDG ELASTIC 6X10 VLCR STRL LF (GAUZE/BANDAGES/DRESSINGS) ×2 IMPLANT
BNDG ESMARK 6X9 LF (GAUZE/BANDAGES/DRESSINGS) ×2
BONE CEMENT PALACOSE (Orthopedic Implant) ×4 IMPLANT
BOWL SMART MIX CTS (DISPOSABLE) ×2 IMPLANT
CEMENT BONE PALACOSE (Orthopedic Implant) ×2 IMPLANT
CLOTH BEACON ORANGE TIMEOUT ST (SAFETY) ×2 IMPLANT
COVER SURGICAL LIGHT HANDLE (MISCELLANEOUS) ×3 IMPLANT
CUFF TOURNIQUET SINGLE 34IN LL (TOURNIQUET CUFF) ×1 IMPLANT
CUFF TOURNIQUET SINGLE 44IN (TOURNIQUET CUFF) IMPLANT
DRAPE EXTREMITY T 121X128X90 (DRAPE) ×2 IMPLANT
DRAPE U-SHAPE 47X51 STRL (DRAPES) ×2 IMPLANT
DRSG ADAPTIC 3X8 NADH LF (GAUZE/BANDAGES/DRESSINGS) ×2 IMPLANT
DRSG PAD ABDOMINAL 8X10 ST (GAUZE/BANDAGES/DRESSINGS) ×2 IMPLANT
DURAPREP 26ML APPLICATOR (WOUND CARE) ×2 IMPLANT
ELECT REM PT RETURN 9FT ADLT (ELECTROSURGICAL) ×2
ELECTRODE REM PT RTRN 9FT ADLT (ELECTROSURGICAL) ×1 IMPLANT
EVACUATOR 3/16  PVC DRAIN (DRAIN)
EVACUATOR 3/16 PVC DRAIN (DRAIN) IMPLANT
FACESHIELD LNG OPTICON STERILE (SAFETY) ×2 IMPLANT
GLOVE SS PI 9.0 STRL (GLOVE) ×4 IMPLANT
GOWN PREVENTION PLUS XLARGE (GOWN DISPOSABLE) ×3 IMPLANT
GOWN STRL NON-REIN LRG LVL3 (GOWN DISPOSABLE) ×5 IMPLANT
HANDPIECE INTERPULSE COAX TIP (DISPOSABLE) ×2
IMMOBILIZER KNEE 20 (SOFTGOODS)
IMMOBILIZER KNEE 20 THIGH 36 (SOFTGOODS) IMPLANT
IMMOBILIZER KNEE 22 UNIV (SOFTGOODS) ×2 IMPLANT
IMMOBILIZER KNEE 24 THIGH 36 (MISCELLANEOUS) IMPLANT
IMMOBILIZER KNEE 24 UNIV (MISCELLANEOUS)
KIT BASIN OR (CUSTOM PROCEDURE TRAY) ×2 IMPLANT
KIT ROOM TURNOVER OR (KITS) ×2 IMPLANT
MANIFOLD NEPTUNE II (INSTRUMENTS) ×2 IMPLANT
NS IRRIG 1000ML POUR BTL (IV SOLUTION) ×2 IMPLANT
PACK TOTAL JOINT (CUSTOM PROCEDURE TRAY) ×2 IMPLANT
PAD ARMBOARD 7.5X6 YLW CONV (MISCELLANEOUS) ×4 IMPLANT
SET HNDPC FAN SPRY TIP SCT (DISPOSABLE) ×1 IMPLANT
SPONGE GAUZE 4X4 12PLY (GAUZE/BANDAGES/DRESSINGS) ×2 IMPLANT
STAPLER VISISTAT 35W (STAPLE) ×2 IMPLANT
SUT VIC AB 0 CTB1 27 (SUTURE) ×6 IMPLANT
SUT VIC AB 2-0 CTB1 (SUTURE) ×6 IMPLANT
TOWEL OR 17X24 6PK STRL BLUE (TOWEL DISPOSABLE) ×2 IMPLANT
TOWEL OR 17X26 10 PK STRL BLUE (TOWEL DISPOSABLE) ×2 IMPLANT
TRAY FOLEY CATH 14FR (SET/KITS/TRAYS/PACK) ×2 IMPLANT
WATER STERILE IRR 1000ML POUR (IV SOLUTION) ×4 IMPLANT

## 2011-10-19 NOTE — Anesthesia Preprocedure Evaluation (Signed)
Anesthesia Evaluation  Patient identified by MRN, date of birth, ID band Patient awake    Reviewed: Allergy & Precautions, H&P , NPO status , Patient's Chart, lab work & pertinent test results  Airway       Dental  (+) Partial Upper and Teeth Intact   Pulmonary neg pulmonary ROS,          Cardiovascular neg cardio ROS     Neuro/Psych Negative Neurological ROS  Negative Psych ROS   GI/Hepatic negative GI ROS, Neg liver ROS,   Endo/Other  Negative Endocrine ROS  Renal/GU negative Renal ROS  Genitourinary negative   Musculoskeletal   Abdominal   Peds  Hematology negative hematology ROS (+)   Anesthesia Other Findings   Reproductive/Obstetrics                           Anesthesia Physical Anesthesia Plan  ASA: I  Anesthesia Plan: General ETT   Post-op Pain Management: MAC Combined w/ Regional for Post-op pain   Induction:   Airway Management Planned:   Additional Equipment:   Intra-op Plan:   Post-operative Plan:   Informed Consent: I have reviewed the patients History and Physical, chart, labs and discussed the procedure including the risks, benefits and alternatives for the proposed anesthesia with the patient or authorized representative who has indicated his/her understanding and acceptance.   Dental Advisory Given  Plan Discussed with: CRNA and Surgeon  Anesthesia Plan Comments:         Anesthesia Quick Evaluation

## 2011-10-19 NOTE — H&P (Signed)
H&p note written and dictated report [id number].

## 2011-10-19 NOTE — Plan of Care (Signed)
Problem: Consults Goal: Diagnosis- Total Joint Replacement Outcome: Progressing Primary Total Knee Left

## 2011-10-19 NOTE — Brief Op Note (Signed)
10/19/2011  10:11 AM  PATIENT:  Dustin [name]  55 y.o. male  PRE-OPERATIVE DIAGNOSIS:  Degenerative joint disease, left knee  POST-OPERATIVE DIAGNOSIS:  Degenerative joint disease, left knee  PROCEDURE:  Procedure(s): TOTAL KNEE ARTHROPLASTY  SURGEON:  Surgeon(s): Kennieth Rad  PHYSICIAN ASSISTANT:   ASSISTANTS: none   ANESTHESIA:   general  EBL:  Total I/O In: 1000 [I.V.:1000] Out: 200 [Urine:200]  BLOOD ADMINISTERED:none  DRAINS: none   LOCAL MEDICATIONS USED:  NONE  SPECIMEN:  No Specimen  DISPOSITION OF SPECIMEN:  N/A  COUNTS:  YES  TOURNIQUET:   Total Tourniquet Time Documented: Thigh (Left) - 102 minutes  DICTATION: .Other Dictation: Dictation Number [id number] REPORT # [id number]  PLAN OF CARE: Admit to inpatient ADMIT TO 5000  PATIENT DISPOSITION:  PACU - hemodynamically stable. PACU  Delay start of Pharmacological VTE agent (>24hrs) due to surgical blood loss or risk of bleeding:  {YES/NO/NOT APPLICABLE:20182NO

## 2011-10-19 NOTE — Op Note (Signed)
Pre-op  Diagnosis: DJD LEFT KNEE POST-OP DIAGNOSIS: SAME. ANESTHESIA: gen. Procedure: LEFT TOTAL KNEE ARTHROPLASTY ) BIOMET)  BLOOD LOSS:150CC. WENT TO RECOVERY ROOM IN STABLE CONDITION.

## 2011-10-19 NOTE — Anesthesia Procedure Notes (Signed)
Anesthesia Regional Block:  Femoral nerve block  Pre-Anesthetic Checklist: ,, timeout performed, Correct Patient, Correct Site, Correct Laterality, Correct Procedure, Correct Position, site marked, Risks and benefits discussed, pre-op evaluation,  At surgeon's request and post-op pain management  Laterality: Left  Prep: Maximum Sterile Barrier Precautions used and Betadine       Needles:  Injection technique: Single-shot  Needle Type: Other   (Arrow 50mm)    Needle Gauge: 22 and 22 G    Additional Needles:  Procedures: nerve stimulator Femoral nerve block  Nerve Stimulator or Paresthesia:  Response: Patellar respose, 0.4 mA,   Additional Responses:   Narrative:  Start time: 10/19/2011 7:05 AM End time: 10/19/2011 7:15 AM Injection made incrementally with aspirations every 5 mL. Anesthesiologist: Mang Hazelrigg,MD  Additional Notes: 2% Lidocaine skin wheel.   Femoral nerve block

## 2011-10-19 NOTE — Anesthesia Postprocedure Evaluation (Signed)
  Anesthesia Post-op Note  Patient: Dustin [name]  Procedure(s) Performed:  TOTAL KNEE ARTHROPLASTY  Patient Location: PACU  Anesthesia Type: GA combined with regional for post-op pain  Level of Consciousness: awake  Airway and Oxygen Therapy: Patient Spontanous Breathing and Patient connected to nasal cannula oxygen  Post-op Pain: moderate  Post-op Assessment: Post-op Vital signs reviewed, Patient's Cardiovascular Status Stable, Respiratory Function Stable and Patent Airway  Post-op Vital Signs: Reviewed and stable  Complications: No apparent anesthesia complications

## 2011-10-19 NOTE — Consult Note (Signed)
ANTICOAGULATION CONSULT NOTE - Initial Consult  Pharmacy Consult for: Coumadin Indication: VTE px s/p L TKA  No Known Allergies  Vital Signs: Temp: 96.9 F (36.1 C) (12/20 1012) Temp src: Oral (12/20 0616) BP: 143/90 mmHg (12/20 1242) Pulse Rate: 74  (12/20 1300)  Labs: No results found for this basename: HGB:2,HCT:3,PLT:3,APTT:3,LABPROT:3,INR:3,HEPARINUNFRC:3,CREATININE:3,CKTOTAL:3,CKMB:3,TROPONINI:3 in the last 72 hours CrCl is unknown because there is no height on file for the current visit.  Medical History: Past Medical History  Diagnosis Date  . History of blood clots     to left arm  . Arthritis     bil knees, left hand  . DVT of upper extremity (deep vein thrombosis)     left brachial vein, 12/2010    Medications:  Prescriptions prior to admission  Medication Sig Dispense Refill  . etodolac (LODINE) 400 MG tablet Take 400 mg by mouth 2 (two) times daily.        Marland Kitchen oxyCODONE-acetaminophen (PERCOCET) 5-325 MG per tablet Take 1 tablet by mouth every 4 (four) hours as needed. For pain        Assessment: 55yom to begin coumadin for VTE px s/p L TKA. Of note, patient had a LUE DVT in March of this year but is not on anticoagulation PTA. Baseline INR 1.03. Coumadin score=9.  Goal of Therapy:  INR 2-3   Plan:  1) Coumadin 10mg  x 1 2) Daily INR 3) Coumadin education-book/video  Fredrik Rigger 10/19/2011,2:17 PM

## 2011-10-19 NOTE — Transfer of Care (Signed)
Immediate Anesthesia Transfer of Care Note  Patient: Dustin [name]  Procedure(s) Performed:  TOTAL KNEE ARTHROPLASTY  Patient Location: PACU  Anesthesia Type: General and GA combined with regional for post-op pain  Level of Consciousness: awake, oriented, sedated, patient cooperative and responds to stimulation  Airway & Oxygen Therapy: Patient Spontanous Breathing  Post-op Assessment: Report given to PACU RN, Post -op Vital signs reviewed and stable and Patient moving all extremities  Post vital signs: Reviewed and stable  Complications: No apparent anesthesia complications

## 2011-10-19 NOTE — Progress Notes (Signed)
C.C.-PAINFUL LEFT KNEE HPI- THIS IS A55 Y/0 MALE TREATED FOR DJD OF THE LEFT KNEE. PATIENT HAS BEEN TREATED WITH BRACING, ANTI-INFLAMMATORY MED. AND PAIN MEDS WITHOUT RELIEF. PMH- BLOOD CLOT IN UPPER EXTREMETY, DJD, MEDICATIONS- LODINE, PERCOCET ALLERGIES-NONE-KNOWN.FAM.HISTORY- NON-CONTRIBUTORY. P.EXAM-BP-142/82,P-82 R.R.-18,TEMP.97.6, HEAD- NORMOCEPHALIC ,NECK-SUPPLE, CHEST-LUNGS CLEAR  ABD. -SOFT. EXTREMETIES- LEFT KNEE WITH GENU--VARUM, LIMITED ROM, TENDER MED AND LAT COMPARTMENTS, CREPITUS OVER MED AND LAT COMPARTMENTS. X-RAY,S REVEAL LOSS OF JOINT SPACE ,SCLEROSIS , AND SPURS OVER BOTH COMPARTMENTS. IMPRESSION-SEVERE DJD LEFT KNEE  PLAN- LEFT TOTAL KNEE ARTHROPLASTY.

## 2011-10-20 ENCOUNTER — Encounter (HOSPITAL_COMMUNITY): Payer: Self-pay | Admitting: Orthopedic Surgery

## 2011-10-20 LAB — CBC
MCH: 28.6 pg (ref 26.0–34.0)
MCHC: 32.2 g/dL (ref 30.0–36.0)
MCV: 89 fL (ref 78.0–100.0)
Platelets: 231 10*3/uL (ref 150–400)

## 2011-10-20 LAB — PROTIME-INR: Prothrombin Time: 15.2 seconds (ref 11.6–15.2)

## 2011-10-20 MED ORDER — OXYCODONE-ACETAMINOPHEN 5-325 MG PO TABS
1.0000 | ORAL_TABLET | ORAL | Status: DC | PRN
  Administered 2011-10-21 – 2011-10-23 (×13): 2 via ORAL
  Filled 2011-10-20 (×13): qty 2

## 2011-10-20 MED ORDER — SENNOSIDES-DOCUSATE SODIUM 8.6-50 MG PO TABS: 1.0000 | ORAL_TABLET | Freq: Every evening | ORAL | Status: DC | PRN

## 2011-10-20 MED ORDER — SODIUM CHLORIDE 0.9 % IJ SOLN
3.0000 mL | Freq: Two times a day (BID) | INTRAMUSCULAR | Status: DC
  Administered 2011-10-21 – 2011-10-23 (×4): 3 mL via INTRAVENOUS

## 2011-10-20 MED ORDER — METHOCARBAMOL 500 MG PO TABS
500.0000 mg | ORAL_TABLET | Freq: Four times a day (QID) | ORAL | Status: DC | PRN
  Administered 2011-10-21 – 2011-10-23 (×7): 500 mg via ORAL
  Filled 2011-10-20 (×7): qty 1

## 2011-10-20 MED ORDER — ZOLPIDEM TARTRATE 5 MG PO TABS: 5.0000 mg | ORAL_TABLET | Freq: Every evening | ORAL | Status: DC | PRN

## 2011-10-20 MED ORDER — WARFARIN SODIUM 10 MG PO TABS
10.0000 mg | ORAL_TABLET | Freq: Once | ORAL | Status: AC
  Administered 2011-10-20: 10 mg via ORAL
  Filled 2011-10-20: qty 1

## 2011-10-20 MED ORDER — SODIUM CHLORIDE 0.9 % IJ SOLN: 3.0000 mL | Freq: Two times a day (BID) | INTRAMUSCULAR | Status: DC

## 2011-10-20 MED ORDER — DOCUSATE SODIUM 100 MG PO CAPS
100.0000 mg | ORAL_CAPSULE | Freq: Two times a day (BID) | ORAL | Status: DC
  Administered 2011-10-21 – 2011-10-23 (×6): 100 mg via ORAL
  Filled 2011-10-20 (×7): qty 1

## 2011-10-20 MED ORDER — BISACODYL 10 MG RE SUPP
10.0000 mg | Freq: Every day | RECTAL | Status: DC | PRN
Start: 2011-10-20 — End: 2011-10-23

## 2011-10-20 NOTE — Plan of Care (Signed)
Problem: Consults Goal: Diagnosis- Total Joint Replacement Primary Total Knee     

## 2011-10-20 NOTE — Progress Notes (Signed)
Physical Therapy Evaluation Patient Details Name: Dustin [name] MRN: [id number] DOB: 01-Nov-1955 Today's Date: 10/20/2011  Problem List: There is no problem list on file for this patient.   Past Medical History:  Past Medical History  Diagnosis Date  . History of blood clots     to left arm  . Arthritis     bil knees, left hand  . DVT of upper extremity (deep vein thrombosis)     left brachial vein, 12/2010   Past Surgical History:  Past Surgical History  Procedure Date  . Left  hand  2011    cyst removed.  . Total knee arthroplasty 10/19/2011    Procedure: TOTAL KNEE ARTHROPLASTY;  Surgeon: Kennieth Rad;  Location: [facility] OR;  Service: Orthopedics;  Laterality: Left;    PT Assessment/Plan/Recommendation PT Assessment Clinical Impression Statement: Pt presents with a medical diagnosis of Left TKA along with the following impairments/deficits and therapy diagnosis listed below. Pt will benefit from skilled PT in the acute care setting in order to maximize functional mobility for a safe d/c home. PT Recommendation/Assessment: Patient will need skilled PT in the acute care venue PT Problem List: Decreased strength;Decreased range of motion;Decreased activity tolerance;Decreased mobility;Decreased knowledge of use of DME;Decreased knowledge of precautions;Pain PT Therapy Diagnosis : Difficulty walking;Acute pain PT Plan PT Frequency: 7X/week PT Treatment/Interventions: DME instruction;Gait training;Stair training;Functional mobility training;Therapeutic activities;Therapeutic exercise;Patient/family education PT Recommendation Follow Up Recommendations: Home health PT;24 hour supervision/assistance Equipment Recommended: Rolling walker with 5" wheels;3 in 1 bedside comode;Tub/shower bench PT Goals  Acute Rehab PT Goals PT Goal Formulation: With patient Time For Goal Achievement: 7 days Pt will go Supine/Side to Sit: with modified independence PT Goal: Supine/Side to Sit -  Progress: Progressing toward goal Pt will go Sit to Supine/Side: with modified independence PT Goal: Sit to Supine/Side - Progress: Progressing toward goal Pt will go Sit to Stand: with modified independence PT Goal: Sit to Stand - Progress: Progressing toward goal Pt will go Stand to Sit: with modified independence PT Goal: Stand to Sit - Progress: Progressing toward goal Pt will Transfer Bed to Chair/Chair to Bed: with modified independence PT Transfer Goal: Bed to Chair/Chair to Bed - Progress: Progressing toward goal Pt will Ambulate: >150 feet;with supervision;with rolling walker PT Goal: Ambulate - Progress: Progressing toward goal Pt will Perform Home Exercise Program: Independently PT Goal: Perform Home Exercise Program - Progress: Progressing toward goal  PT Evaluation Precautions/Restrictions  Precautions Precautions: Knee Required Braces or Orthoses: Yes Restrictions Weight Bearing Restrictions: Yes LLE Weight Bearing: Weight bearing as tolerated Prior Functioning  Home Living Lives With: Alone Receives Help From: Family Type of Home: Apartment Home Layout: One level Home Access: Level entry;Elevator (elevator to 5th floor) Bathroom Shower/Tub: Tub/shower unit;Curtain Bathroom Toilet: Handicapped height Bathroom Accessibility: Yes How Accessible: Accessible via walker Home Adaptive Equipment: None Prior Function Level of Independence: Independent with basic ADLs;Independent with homemaking with ambulation;Independent with gait;Independent with transfers Able to Take Stairs?: Yes Driving: No Vocation: On disability Cognition Cognition Arousal/Alertness: Awake/alert Overall Cognitive Status: Appears within functional limits for tasks assessed Orientation Level: Oriented X4 Sensation/Coordination Sensation Light Touch: Appears Intact Stereognosis: Appears Intact Hot/Cold: Appears Intact Proprioception: Appears Intact Coordination Gross Motor Movements are  Fluid and Coordinated: Yes Fine Motor Movements are Fluid and Coordinated: Yes Extremity Assessment RLE Assessment RLE Assessment: Within Functional Limits LLE Assessment LLE Assessment: Exceptions to WFL LLE AROM (degrees) Overall AROM Left Lower Extremity: Deficits;Due to pain (Hip and Ankle WFL; Knee 0-70 degrees) LLE  Strength LLE Overall Strength: Deficits;Due to pain (Hip and Ankle WFL; Pt able to complete SLR with min assist) Mobility (including Balance) Bed Mobility Bed Mobility: Yes Supine to Sit: 4: Min assist;With rails;HOB elevated (Comment degrees) (30) Supine to Sit Details (indicate cue type and reason): VC for sequencing and hand placement Sitting - Scoot to Edge of Bed: 5: Supervision;With rail Sitting - Scoot to Edge of Bed Details (indicate cue type and reason): VC for hand placement Transfers Transfers: Yes Sit to Stand: 4: Min assist;With upper extremity assist;From bed Sit to Stand Details (indicate cue type and reason): VC for hand placement and safety to RW. Min assist for stability into standing Stand to Sit: 4: Min assist;To chair/3-in-1;With upper extremity assist Stand to Sit Details: VC for hand placement. Pt able to control descent Ambulation/Gait Ambulation/Gait: Yes Ambulation/Gait Assistance: 4: Min assist (Minguard assist) Ambulation/Gait Assistance Details (indicate cue type and reason): VC for proper sequencing and safety to RW  Ambulation Distance (Feet): 75 Feet Assistive device: Rolling walker Gait Pattern: Step-to pattern;Decreased step length - right;Decreased stance time - left;Decreased hip/knee flexion - left;Trunk flexed Gait velocity: Decreased gait speed Stairs: No Wheelchair Mobility Wheelchair Mobility: No    Exercise  Total Joint Exercises Ankle Circles/Pumps: AROM;Strengthening;Both;10 reps;Supine Quad Sets: AROM;Left;Strengthening;10 reps;Supine Heel Slides: AAROM;Strengthening;Left;10 reps;Seated Straight Leg Raises:  AAROM;Strengthening;Left;10 reps;Supine Long Arc Quad: AAROM;Strengthening;Left;10 reps;Seated End of Session PT - End of Session Equipment Utilized During Treatment: Gait belt;Left knee immobilizer Activity Tolerance: Patient tolerated treatment well Patient left: in chair;with call bell in reach Nurse Communication: Mobility status for transfers;Mobility status for ambulation General Behavior During Session: [facility] for tasks performed Cognition: [facility] for tasks performed  Milana Kidney 10/20/2011, 1:35 PM  10/20/2011 Milana Kidney DPT PAGER: [phone] OFFICE: [phone]

## 2011-10-20 NOTE — Progress Notes (Signed)
ANTICOAGULATION CONSULT NOTE - Follow Up Consult  Pharmacy Consult for: Coumadin Indication: VTE px s/p L TKA  No Known Allergies  Vital Signs: Temp: 98.8 F (37.1 C) (12/21 0603) Temp src: Oral (12/21 0603) BP: 149/82 mmHg (12/21 0603) Pulse Rate: 92  (12/21 0603)  Labs:  Basename 10/20/11 0500  HGB 12.0*  HCT 37.3*  PLT 231  APTT --  LABPROT 15.2  INR 1.18  HEPARINUNFRC --  CREATININE --  CKTOTAL --  CKMB --  TROPONINI --   Assessment: 55yom on coumadin for VTE px s/p L TKA. INR of 1.18 below goal after first dose of coumadin as expected.   Goal of Therapy:  INR 2-3   Plan:  1) Repeat coumadin 10mg  x 1 2) Follow up INR in AM  Dustin [name] 10/20/2011,9:06 AM

## 2011-10-20 NOTE — Progress Notes (Signed)
Temp 100.5 dressing is dry ,less pain plan d/c pca in am. Tolerated PT well .ENCOURAGE USE OF SPIROMETRY, AND INCREASE FLUID INTAKE.
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
## 2011-10-20 NOTE — Progress Notes (Signed)
Occupational Therapy Evaluation Patient Details Name: Dustin [name] MRN: [id number] DOB: 1956-05-03 Today's Date: 10/20/2011 Time:1040 - 1130 50 min Pain - 5/10. Pain pump  Problem List: There is no problem list on file for this patient.   Past Medical History:  Past Medical History  Diagnosis Date  . History of blood clots     to left arm  . Arthritis     bil knees, left hand  . DVT of upper extremity (deep vein thrombosis)     left brachial vein, 12/2010   Past Surgical History:  Past Surgical History  Procedure Date  . Left  hand  2011    cyst removed.    OT Assessment/Plan/Recommendation OT Assessment -  PAINFUL LEFT KNEE HPI- THIS IS A55 Y/0 MALE TREATED FOR DJD OF THE LEFT KNEE. PATIENT HAS BEEN TREATED WITH BRACING, ANTI-INFLAMMATORY MED. AND PAIN MEDS WITHOUT RELIEF IMPRESSION-SEVERE DJD LEFT KNEE  PLAN- LEFT TOTAL KNEE ARTHROPLASTY.      OT Recommendation/Assessment: Patient will need skilled OT in the acute care venue OT Problem List: Decreased range of motion;Decreased knowledge of use of DME or AE Barriers to Discharge: None OT Therapy Diagnosis : Acute pain OT Plan OT Frequency: Min 2X/week OT Treatment/Interventions: Self-care/ADL training;DME and/or AE instruction;Therapeutic activities;Patient/family education OT Recommendation Follow Up Recommendations: Home health OT Equipment Recommended: 3 in 1 bedside comode;Tub/shower bench;Rolling walker with 5" wheels Individuals Consulted Consulted and Agree with Results and Recommendations: Patient OT Goals  See navigator. Mod I with LB B/D, toilet and tub bench transfer with Dustin [name] DME and AE.  OT Evaluation Precautions/Restrictions  Precautions Precautions: Knee Required Braces or Orthoses: Yes Restrictions Weight Bearing Restrictions: Yes LLE Weight Bearing: Weight bearing as tolerated Prior Functioning Home Living Lives With: Alone Receives Help From: Family;Friend(s) Type of Home:  Apartment Home Layout: One level Home Access: Level entry;Elevator (elevator to 5th floor) Bathroom Shower/Tub: Tub/shower unit;Curtain Bathroom Toilet: Handicapped height Bathroom Accessibility: Yes How Accessible: Accessible via walker Home Adaptive Equipment: None Prior Function Level of Independence: Independent with basic ADLs;Independent with homemaking with ambulation Able to Take Stairs?: Yes Driving: No Vocation: On disability ADL see navigator  Vision/Perception  Vision - History Baseline Vision: No visual deficits Cognition Cognition Arousal/Alertness: Awake/alert Overall Cognitive Status: Appears within functional limits for tasks assessed Orientation Level: Oriented X4 Sensation/Coordination Sensation Light Touch: Appears Intact Stereognosis: Appears Intact Hot/Cold: Appears Intact Proprioception: Appears Intact Coordination Gross Motor Movements are Fluid and Coordinated: Yes Fine Motor Movements are Fluid and Coordinated: Yes Mobility  - steady A   End of Session OT - End of Session Equipment Utilized During Treatment: Gait belt;Left knee immobilizer Activity Tolerance: Patient tolerated treatment well Patient left: in chair;with call bell in reach General Behavior During Session: Dustin [name] for tasks performed Cognition: [name] for tasks performed  Skilled Intervention: focus on ADL retrainnig with Dustin [name] AE. Pt will need additional training and will benefit from HHOT.   Dustin [name],Dustin [name] 10/20/2011, 11:57 AM

## 2011-10-20 NOTE — Op Note (Signed)
NAMEREVANTH, NEIDIG               ACCOUNT NO.:  [id number]  MEDICAL RECORD NO.:  [id number]  LOCATION:  5011                         FACILITY:  [facility]  PHYSICIAN:  Myrtie Neither, MD      DATE OF BIRTH:  [birth year]  DATE OF PROCEDURE: DATE OF DISCHARGE:                              OPERATIVE REPORT   PREOPERATIVE DIAGNOSIS:  Degenerative joint disease, left knee.  Varus deformity.  POSTOPERATIVE DIAGNOSIS:  Degenerative joint disease, left knee.  Varus deformity.  ANESTHESIA:  General.  PROCEDURE:  Left total knee arthroplasty, Biomet implant.  The patient was taken to the operating room.  After given adequate preop medications, given general anesthesia and intubated.  Left knee was prepped with DuraPrep and draped in sterile manner.  Tourniquet and Bovie used for hemostasis.  Anterior midline incision made over the left knee going through the tibial tuberosity to the quadriceps.  Sharp and blunt dissection made medially.  A medial paramedian incision made into the capsule extending from the tibial tuberosity to the quadriceps. Patella was reflected laterally.  Osteophytes about the patella, femur, and tibia were resected.  Soft tissue resection was done.  Medial release was also done due to varus deformity.  This allowed balancing of the knee with bringing the knee into neutral to slight valgus position. Then, with the knee in the flexed position, tibial cutting jig was then put in place.  A 10-mm tibial surface was resected.  Further soft tissue and osteophytes posteriorly were resected.  Next, reaming down the femoral canal was then done followed by placement of distal femoral cutting jig.  After the distal femoral cut was made, sizing of the femur was found to be 72.5.  Appropriate cutting jig 72.5 for the femur was done was put in place.  Anterior and posterior cuts as well as chamfering cuts were made.  Trial femur was put in place and found to be a very good snug fit.   Attention was then turned back to the tibia. Osteophytes were resected.  Sizing of the proximal tibia was that of 83 mm.  Trial femur as well as tibial trial were put in place with a 10-mm poly.  This allowed full extension, full flexion, and good medial and lateral stability.  The knee was then taken back into flexed position. Trial implants were removed.  Appropriate proximal tibial cutting jig was put in place and appropriate cuts mad.  Trials were put back.  10 mm was found to have full extension, full flexion, and good medial and lateral stability.  Sizing of the patella was found to be 37 mm. Appropriate cutting jig was put in place, and appropriate cut was made for the patella for single pay.  With all 3 components put in place, had full extension, full flexion, good medial and lateral stability, good tracking of the patella.  Trial implants were removed.  Copious irrigation was done of the femur, tibia, and patella. Methylmethacrylate was then mixed.  The patella and the tibial components were cemented.  The femoral component was press fitted. After setting of the cement, trial of 10 mm put in again; full extension, full flexion, and good medial and lateral stability  with good tracking of the patella.  A 12 mm was tried and was found to be too tight.  Final components in place was a large patella, 37 mm; 72.5 trial femoral press-fit component; 83-mm tibial surface with a 10 mm x 83 mm bearing surface.  The final bearing surface was locked in place. Copious irrigation and hemostasis obtained.  Wound closure was then done with 0 Vicryl for the fascia, 2-0 for the subcutaneous, and skin staples for the skin. Compressive dressing was applied, and knee immobilizer applied.  The patient tolerated the procedure quite well, went to recovery room in stable and satisfactory condition.     Myrtie Neither, MD     AC/MEDQ  D:  10/19/2011  T:  10/20/2011  Job:  [id number]

## 2011-10-20 NOTE — Progress Notes (Signed)
Social Work order in place; met with patient regarding discharge disposition. He lives alone in a senior apartment called [address]. He states that he plans to return home- he is ADAMANT that he will not go into a SNF for rehab and that his family will help him at home. He has an 55 year old daughter and supportive family. Wants home health for physical therapy. Discussed with patient- he chooses [facility] for [facility] and DME.  Notified RNCM Vance Peper.  No Social Work needs identified. Social worker signing off.  Darylene Price, BSW, 10/20/2011 9:09 AM

## 2011-10-21 LAB — CBC
MCH: 28.9 pg (ref 26.0–34.0)
MCHC: 32.6 g/dL (ref 30.0–36.0)
MCV: 88.5 fL (ref 78.0–100.0)
Platelets: 212 10*3/uL (ref 150–400)

## 2011-10-21 LAB — PROTIME-INR: Prothrombin Time: 17.7 seconds — ABNORMAL HIGH (ref 11.6–15.2)

## 2011-10-21 MED ORDER — WARFARIN SODIUM 10 MG PO TABS
10.0000 mg | ORAL_TABLET | Freq: Once | ORAL | Status: AC
  Administered 2011-10-21: 10 mg via ORAL
  Filled 2011-10-21 (×2): qty 1

## 2011-10-21 NOTE — Progress Notes (Signed)
AFEBRILE, PAIN UNDER CONTROL, TOLERATED PT WELL, NO BM YET, H&H IS STABLE. INR-1.73 DRESSING IS DRY. WILL NEED HH AND PT ARRANGED FOR DISCHARGE ON Monday.

## 2011-10-21 NOTE — Progress Notes (Signed)
ANTICOAGULATION CONSULT NOTE - Follow Up Consult  Pharmacy Consult for: Coumadin Indication: VTE px s/p L TKA  No Known Allergies  Vital Signs: Temp: 98.5 F (36.9 C) (12/22 0603) BP: 143/67 mmHg (12/22 0603) Pulse Rate: 90  (12/22 0603) Wt 98.1 kg on 10/11/2011 (prior to this adm)  Labs:  Basename 10/21/11 0600 10/20/11 0500  HGB 10.3* 12.0*  HCT 31.6* 37.3*  PLT 212 231  APTT -- --  LABPROT 17.7* 15.2  INR 1.43 1.18  HEPARINUNFRC -- --  CREATININE -- --  CKTOTAL -- --  CKMB -- --  TROPONINI -- --   Assessment: 55 yo M on coumadin for VTE px s/p L TKA. INR of 1.43 below goal but increasing towards goal as expected after two doses of coumadin. Hgb trending toward, plt stable, no bleeding noted.   Goal of Therapy:  INR 2-3   Plan:  1) Repeat coumadin 10 mg PO x 1 2) Follow up INR in AM  Maudry Mayhew N 10/21/2011,7:46 AM

## 2011-10-21 NOTE — Progress Notes (Signed)
Occupational Therapy Treatment Patient Details Name: TRIP CAVANAGH MRN: [id number] DOB: 06-Mar-1956 Today's Date: 10/21/2011  OT Assessment/Plan OT Assessment/Plan Comments on Treatment Session: Pt. moving well and requests HH therapies at D/C home to continue to advance function with ADLs. OT Plan: Discharge plan remains appropriate OT Frequency: Min 2X/week Follow Up Recommendations: Home health OT Equipment Recommended: Rolling walker with 5" wheels;3 in 1 bedside comode;Tub/shower bench OT Goals Acute Rehab OT Goals OT Goal Formulation: With patient Time For Goal Achievement: 2 weeks ADL Goals Pt Will Perform Lower Body Bathing: with modified independence;Sit to stand from chair ADL Goal: Lower Body Bathing - Progress: Not addressed Pt Will Perform Lower Body Dressing: with modified independence;Sit to stand from chair ADL Goal: Lower Body Dressing - Progress: Not addressed Pt Will Transfer to Toilet: with modified independence;with DME;3-in-1 ADL Goal: Toilet Transfer - Progress: Not addressed Pt Will Perform Tub/Shower Transfer: Tub transfer;Transfer tub bench;Grab bars;with DME ADL Goal: Tub/Shower Transfer - Progress: Progressing toward goals  OT Treatment Precautions/Restrictions  Precautions Precautions: Knee Required Braces or Orthoses: Yes Knee Immobilizer: On except when in CPM Restrictions Weight Bearing Restrictions: Yes LLE Weight Bearing: Partial weight bearing LLE Partial Weight Bearing Percentage or Pounds: 50   ADL ADL Tub/Shower Transfer: Simulated;Moderate assistance Tub/Shower Transfer Details (indicate cue type and reason): Pt. provided with mod verbal cues for hand placement and technique with hand placement on RW and back of tub bench. Pt. provided with assist to elevate left LE over wall of tub. Tub/Shower Transfer Method: Science writer: Counsellor Used: Other (comment) (tub transfer bench) ADL  Comments: Pt. ambulated ~10' with RW to tub transfer bench to simulate transfer. Ptated on use of AE for LB dressing and bathing to increase functional independence with ADLs. Mobility  Bed Mobility Bed Mobility: No Transfers Transfers: Yes Sit to Stand: 5: Supervision;With armrests;From chair/3-in-1 Sit to Stand Details (indicate cue type and reason): Min verbal cues for hand placement on RW and armrests to increase safety. Exercises    End of Session OT - End of Session Equipment Utilized During Treatment: Gait belt;Left knee immobilizer Activity Tolerance: Patient tolerated treatment well Patient left: in chair;with call bell in reach General Behavior During Session: [facility] for tasks performed Cognition: [facility] for tasks performed  Cassandria Anger, OTR/L Pager [phone]  10/21/2011, 8:47 AM

## 2011-10-21 NOTE — Progress Notes (Signed)
Physical Therapy Treatment Patient Details Name: Dustin [name] MRN: [id number] DOB: [birth year] Today's Date: 10/21/2011  PT Assessment/Plan  PT - Assessment/Plan Comments on Treatment Session: Pt progressing well since eval yesterday; he was able to ambulate at a greater distance as well as perform all mobility with less assistance.  (Pt safe to go home) PT Plan: Discharge plan remains appropriate PT Frequency: 7X/week Follow Up Recommendations: Home health PT;24 hour supervision/assistance Equipment Recommended: Rolling walker with 5" wheels;3 in 1 bedside comode;Tub/shower bench PT Goals  Acute Rehab PT Goals PT Goal Formulation: With patient PT Goal: Supine/Side to Sit - Progress: Progressing toward goal PT Goal: Sit to Supine/Side - Progress: Progressing toward goal PT Goal: Sit to Stand - Progress: Progressing toward goal PT Goal: Stand to Sit - Progress: Progressing toward goal PT Transfer Goal: Bed to Chair/Chair to Bed - Progress: Progressing toward goal PT Goal: Ambulate - Progress: Met PT Goal: Perform Home Exercise Program - Progress: Progressing toward goal  PT Treatment Precautions/Restrictions  Precautions Precautions: Knee Required Braces or Orthoses: Yes Knee Immobilizer: On except when in CPM Restrictions Weight Bearing Restrictions: Yes LLE Weight Bearing: Partial weight bearing LLE Partial Weight Bearing Percentage or Pounds: 50 Mobility (including Balance) Bed Mobility Bed Mobility: No Transfers Transfers: Yes Sit to Stand: 5: Supervision;With armrests;From chair/3-in-1 Sit to Stand Details (indicate cue type and reason): VC for hand placement for safety to RW Stand to Sit: 5: Supervision;With upper extremity assist;To chair/3-in-1 Stand to Sit Details: VC for hand placement Ambulation/Gait Ambulation/Gait: Yes Ambulation/Gait Assistance: 5: Supervision Ambulation/Gait Assistance Details (indicate cue type and reason): VC for increased heel strike as  well as postural cues throughout. Ambulation Distance (Feet): 200 Feet Assistive device: Rolling walker Gait Pattern: Step-to pattern;Decreased step length - right;Decreased stance time - left;Decreased hip/knee flexion - left;Trunk flexed Gait velocity: Decreased gait speed Stairs: No    Exercise  Total Joint Exercises Heel Slides: AROM;Strengthening;Left;10 reps;Seated Hip ABduction/ADduction: AROM;Strengthening;Left;10 reps;Supine Straight Leg Raises: AROM;Strengthening;Left;10 reps;Supine (with KI) Long Arc Quad: AROM;Strengthening;Left;10 reps;Seated End of Session PT - End of Session Equipment Utilized During Treatment: Gait belt;Left knee immobilizer Activity Tolerance: Patient tolerated treatment well Patient left: in chair;with call bell in reach Nurse Communication: Mobility status for transfers;Mobility status for ambulation General Behavior During Session: [facility] for tasks performed Cognition: [facility] for tasks performed  Milana Kidney 10/21/2011, 12:48 PM  10/21/2011 Milana Kidney DPT PAGER: [phone] OFFICE: [phone]

## 2011-10-22 LAB — GLUCOSE, CAPILLARY: Glucose-Capillary: 104 mg/dL — ABNORMAL HIGH (ref 70–99)

## 2011-10-22 LAB — CBC
Hemoglobin: 11 g/dL — ABNORMAL LOW (ref 13.0–17.0)
MCH: 29.6 pg (ref 26.0–34.0)
MCHC: 34.1 g/dL (ref 30.0–36.0)
MCV: 87.1 fL (ref 78.0–100.0)
Platelets: 202 10*3/uL (ref 150–400)

## 2011-10-22 LAB — PROTIME-INR: Prothrombin Time: 28.1 seconds — ABNORMAL HIGH (ref 11.6–15.2)

## 2011-10-22 MED ORDER — WARFARIN SODIUM 5 MG PO TABS
5.0000 mg | ORAL_TABLET | Freq: Once | ORAL | Status: AC
  Administered 2011-10-22: 5 mg via ORAL
  Filled 2011-10-22: qty 1

## 2011-10-22 NOTE — Progress Notes (Signed)
Physical Therapy Treatment Patient Details Name: MARQUS MACPHEE MRN: [id number] DOB: Jun 16, 1956 Today's Date: 10/22/2011  PT Assessment/Plan  PT - Assessment/Plan Comments on Treatment Session: Pt able to ambulate a greater distance with normal gait speed and step lengths. Pt at a modified independence- supervision level for all mobility. pt is safe to d/c home PT Plan: Discharge plan remains appropriate PT Frequency: 7X/week Follow Up Recommendations: Home health PT;24 hour supervision/assistance Equipment Recommended: Rolling walker with 5" wheels;3 in 1 bedside comode;Tub/shower bench PT Goals  Acute Rehab PT Goals PT Goal Formulation: With patient PT Goal: Supine/Side to Sit - Progress: Met PT Goal: Sit to Supine/Side - Progress: Met PT Goal: Sit to Stand - Progress: Progressing toward goal PT Goal: Stand to Sit - Progress: Progressing toward goal PT Transfer Goal: Bed to Chair/Chair to Bed - Progress: Progressing toward goal PT Goal: Ambulate - Progress: Met PT Goal: Perform Home Exercise Program - Progress: Progressing toward goal  PT Treatment Precautions/Restrictions  Precautions Precautions: Knee Required Braces or Orthoses: Yes Knee Immobilizer: On except when in CPM Restrictions Weight Bearing Restrictions: Yes LLE Weight Bearing: Partial weight bearing LLE Partial Weight Bearing Percentage or Pounds: 50 Mobility (including Balance) Bed Mobility Bed Mobility: Yes Supine to Sit: 6: Modified independent (Device/Increase time);With rails;HOB elevated (Comment degrees) (20) Sitting - Scoot to Edge of Bed: 6: Modified independent (Device/Increase time) Sit to Supine - Right: 6: Modified independent (Device/Increase time) Transfers Transfers: Yes Sit to Stand: 5: Supervision;From bed;With upper extremity assist Sit to Stand Details (indicate cue type and reason): VC for hand placement Stand to Sit: 5: Supervision;With upper extremity assist;To chair/3-in-1 Stand to  Sit Details: VC for hand placement  Ambulation/Gait Ambulation/Gait: Yes Ambulation/Gait Assistance: 5: Supervision Ambulation/Gait Assistance Details (indicate cue type and reason): VC for increased heel strike and knee extension throughout stance time.  Ambulation Distance (Feet): 200 Feet Assistive device: Rolling walker Gait Pattern: Step-to pattern;Decreased step length - right;Decreased stance time - left;Decreased hip/knee flexion - left;Trunk flexed Gait velocity: Normal Stairs: No    Exercise  Total Joint Exercises Heel Slides: AROM;Strengthening;Left;10 reps;Seated Straight Leg Raises: AROM;Strengthening;Left;10 reps;Supine Long Arc Quad: AROM;Strengthening;Left;10 reps;Seated End of Session PT - End of Session Equipment Utilized During Treatment: Gait belt;Left knee immobilizer Activity Tolerance: Patient tolerated treatment well Patient left: with call bell in reach;in bed Nurse Communication: Mobility status for transfers;Mobility status for ambulation General Behavior During Session: [facility] for tasks performed Cognition: [facility] for tasks performed  Milana Kidney 10/22/2011, 10:10 AM  10/22/2011 Milana Kidney DPT PAGER: [phone] OFFICE: [phone]

## 2011-10-22 NOTE — Progress Notes (Signed)
AFEBRILE,H&H STABLE, PAIN UNDER CONTROL WITH PERCOCET 2Q4HR PRN. WOUND HEALING WELL,  PLAN DISCHARGE IN AM.

## 2011-10-22 NOTE — Progress Notes (Signed)
ANTICOAGULATION CONSULT NOTE - Follow Up Consult  Pharmacy Consult for: Coumadin Indication: VTE px s/p L TKA  No Known Allergies  Vital Signs: Temp: 98.4 F (36.9 C) (12/23 0517) Temp src: Oral (12/23 0517) BP: 114/70 mmHg (12/23 0517) Pulse Rate: 77  (12/23 0517) Wt 98.1 kg on 10/11/2011 (prior to this adm)  Labs:  [facility] 10/22/11 0635 10/21/11 0600 10/20/11 0500  HGB 11.0* 10.3* --  HCT 32.3* 31.6* 37.3*  PLT 202 212 231  APTT -- -- --  LABPROT 28.1* 17.7* 15.2  INR 2.58* 1.43 1.18  HEPARINUNFRC -- -- --  CREATININE -- -- --  CKTOTAL -- -- --  CKMB -- -- --  TROPONINI -- -- --   Assessment: 55 yo M on coumadin for VTE px s/p L TKA. Therapeutic INR of 2.58 with large increase in INR. CBC stable, no bleeding noted.  Goal of Therapy:  INR 2-3   Plan:  1) Coumadin 5 mg PO x 1 2) Follow up INR in AM  Maudry Mayhew N 10/22/2011,8:10 AM

## 2011-10-23 LAB — PROTIME-INR
INR: 2.88 — ABNORMAL HIGH (ref 0.00–1.49)
Prothrombin Time: 30.6 seconds — ABNORMAL HIGH (ref 11.6–15.2)

## 2011-10-23 MED ORDER — WARFARIN SODIUM 5 MG PO TABS: 5.0000 mg | ORAL_TABLET | Freq: Once | ORAL | Status: DC

## 2011-10-23 MED ORDER — WARFARIN SODIUM 5 MG PO TABS
5.0000 mg | ORAL_TABLET | Freq: Once | ORAL | Status: DC
  Filled 2011-10-23: qty 1

## 2011-10-23 MED ORDER — OXYCODONE-ACETAMINOPHEN 5-325 MG PO TABS: 1.0000 | ORAL_TABLET | ORAL | Status: AC | PRN

## 2011-10-23 NOTE — Progress Notes (Signed)
Physical Therapy Treatment Patient Details Name: Dustin [name] MRN: [id number] DOB: 1955/12/11 Today's Date: 10/23/2011  PT Assessment/Plan  PT - Assessment/Plan Comments on Treatment Session: Pt has good knee strength in both quads and hamstring and is able to control LEs throughout ambulation without the knee immobilizer. Pt is at a modified independence-supervision level for all mobility and is safe to d/c home. PT Plan: Discharge plan remains appropriate PT Frequency: 7X/week Follow Up Recommendations: Home health PT;24 hour supervision/assistance Equipment Recommended: Rolling walker with 5" wheels;3 in 1 bedside comode;Tub/shower bench PT Goals  Acute Rehab PT Goals PT Goal Formulation: With patient PT Goal: Supine/Side to Sit - Progress: Met PT Goal: Sit to Supine/Side - Progress: Met PT Goal: Sit to Stand - Progress: Met PT Goal: Stand to Sit - Progress: Met PT Transfer Goal: Bed to Chair/Chair to Bed - Progress: Met PT Goal: Ambulate - Progress: Met PT Goal: Perform Home Exercise Program - Progress: Progressing toward goal  PT Treatment Precautions/Restrictions  Precautions Precautions: Knee Required Braces or Orthoses: Yes Knee Immobilizer: On except when in CPM Restrictions Weight Bearing Restrictions: Yes LLE Weight Bearing: Partial weight bearing LLE Partial Weight Bearing Percentage or Pounds: 50 Mobility (including Balance) Bed Mobility Bed Mobility: Yes Supine to Sit: 6: Modified independent (Device/Increase time);With rails;HOB elevated (Comment degrees) Sitting - Scoot to Edge of Bed: 6: Modified independent (Device/Increase time) Sit to Supine - Right: 6: Modified independent (Device/Increase time) Transfers Transfers: Yes Sit to Stand: 6: Modified independent (Device/Increase time);From bed;With upper extremity assist Stand to Sit: 6: Modified independent (Device/Increase time);With upper extremity assist;To bed Ambulation/Gait Ambulation/Gait:  Yes Ambulation/Gait Assistance: 5: Supervision Ambulation/Gait Assistance Details (indicate cue type and reason): VC for upright posture throughout as well as increased knee extension during stance phase Ambulation Distance (Feet): 200 Feet Assistive device: Rolling walker Gait Pattern: Step-through pattern;Trunk flexed Gait velocity: Normal Stairs: No    Exercise  Total Joint Exercises Heel Slides: AROM;Strengthening;Left;10 reps;Seated Straight Leg Raises: AROM;Strengthening;Left;10 reps;Supine Long Arc Quad: AROM;Strengthening;Left;10 reps;Seated End of Session PT - End of Session Equipment Utilized During Treatment: Gait belt Activity Tolerance: Patient tolerated treatment well Patient left: with call bell in reach;in bed Nurse Communication: Mobility status for transfers;Mobility status for ambulation General Behavior During Session: [facility] for tasks performed Cognition: [facility] for tasks performed  Milana Kidney 10/23/2011, 12:50 PM  10/23/2011 Milana Kidney DPT PAGER: [phone] OFFICE: [phone]

## 2011-10-23 NOTE — Progress Notes (Signed)
PATIENT IS AFEBRILE, H&H STABLE,INR 2.8 DOING WELL, TO HAVE HH AND PT ARRANGED. RETURN TO THE OFFICE IN 10DAYS.

## 2011-10-23 NOTE — Progress Notes (Signed)
Patient has been setup with Home Health thru [facility].Choice was offered.  CPM, rolling walker and three in one have been ordered the [facility].

## 2011-10-23 NOTE — Progress Notes (Signed)
ANTICOAGULATION CONSULT NOTE - Follow Up Consult  Pharmacy Consult for: Coumadin Indication: VTE px s/p L TKA  No Known Allergies  Vital Signs: Temp: 98.2 F (36.8 C) (12/24 0514) Temp src: Oral (12/24 0514) BP: 125/77 mmHg (12/24 0514) Pulse Rate: 71  (12/24 0514) Wt 98.1 kg on 10/11/2011 (prior to this adm)  Labs:  [facility] 10/23/11 0715 10/22/11 0635 10/21/11 0600  HGB -- 11.0* 10.3*  HCT -- 32.3* 31.6*  PLT -- 202 212  APTT -- -- --  LABPROT 30.6* 28.1* 17.7*  INR 2.88* 2.58* 1.43  HEPARINUNFRC -- -- --  CREATININE -- -- --  CKTOTAL -- -- --  CKMB -- -- --  TROPONINI -- -- --   Assessment: 55 yo M on coumadin for VTE px s/p L TKA. Therapeutic INR of 2.88. CBC stable, no bleeding noted.  Had Coumadin 10 mg x 3 days and 5 mg x 1 day.  Goal of Therapy:  INR 2-3   Plan:  1) Coumadin 5 mg PO x 1 today. 2) Patient likely needs Coumadin 5 mg today and then Coumadin 7.5 mg daily until next INR check.  Would check in 1-2 days after d/c home.  Bjorn Hallas C 10/23/2011,8:59 AM

## 2011-10-23 NOTE — Progress Notes (Signed)
Pt is S/P L TKR. Knee precautions. +cms. LLE slight edema. KI, CPM and RW per order. Pt is 50% PWB LLE with one assist. Pt has dry mepilex's to L knee that are dry and intact. Pt can turn self and floats heels. Pt voids without difficulty. Pt repts LBM 12/19. Abdomen soft flat nontender and nondistended. BS +x4 but hypoactive.  Pt refuses laxative at this time. Pt repts that he is passing gas and tolerates diet fair to good. Pt denies nausea or vomiting. Pt performs IS per order. No s/sx cardiac or resp distress. Lungs CTA. Pt denies cough.

## 2011-10-24 NOTE — Discharge Summary (Signed)
Dustin [name], Dustin [name]               ACCOUNT NO.:  [id number]  MEDICAL RECORD NO.:  [id number]  LOCATION:  5011                         FACILITY:  [facility]  PHYSICIAN:  Myrtie Neither, MD      DATE OF BIRTH:  04-Nov-1955  DATE OF ADMISSION:  10/19/2011 DATE OF DISCHARGE:  10/23/2011                              DISCHARGE SUMMARY   ADMITTING DIAGNOSIS:  Degenerative joint disease, left knee.  DISCHARGE DIAGNOSIS:  Degenerative joint disease, left knee.  COMPLICATIONS:  None.  INFECTIONS:  None.  OPERATION:  Left total knee arthroplasty done on October 19, 2011.  PERTINENT HISTORY:  This is a 55 year old black male followed in the office for degenerative joint disease involving the left knee with severe varus deformity.  The patient had been treated with anti- inflammatories, bracing, with progressive worsening over the last few months.  The patient had been treated with anti-arthritis medicine as well as pain medication.  Pertinent physical exam of the left knee, marked varus deformity, crepitus in medial and lateral compartments and in patellofemoral joint. Range of motion is good, but limited.  Neurovascular status is intact. Negative Homan's test.  X-rays revealed loss of joint space, spurring, sclerosis and varus deformity with effusion.  HOSPITAL COURSE:  The patient underwent preop laboratory, CBC, EKG, chest x-ray, CMET, PT, PTT, platelet count, UA.  The patient's labs were stable enough to undergo surgery.  The patient underwent left total knee arthroplasty, tolerated procedure quite well with pre and postop IV antibiotics, started on prophylactic Coumadin therapy, and underwent physical therapy and occupational therapy as well.  Patient's pain was brought under control with use of Percocet 1-2 q.4 p.r.n. for pain. Partial weightbearing 50% on the left side with use of walker.  The patient did spike a temp of 102, with use of spirometry and deep breathing and coughing.   The patient is afebrile, temperature 97.  H and H has been stable at 11 and 32, presently INR of 2.8 and a PT of 30.6.  The patient presently on Coumadin 5 mg daily.  The patient will be discharged home with physical therapy and home health was arranged. Continue his Coumadin therapy, Percocet 1-2 q.4 p.r.n. for pain, Coumadin 5 mg, weekly INRs, dry dressing changes to the left knee, physical therapy with partial weightbearing 50% on the left leg.  The patient will be seen in the office in 10 days.  The patient is being discharged in stable and satisfactory condition.     Myrtie Neither, MD     AC/MEDQ  D:  10/23/2011  T:  10/24/2011  Job:  [id number]

## 2011-10-25 DIAGNOSIS — Z5181 Encounter for therapeutic drug level monitoring: Secondary | ICD-10-CM | POA: Diagnosis not present

## 2011-10-25 DIAGNOSIS — Z471 Aftercare following joint replacement surgery: Secondary | ICD-10-CM | POA: Diagnosis not present

## 2011-10-25 DIAGNOSIS — Z96659 Presence of unspecified artificial knee joint: Secondary | ICD-10-CM | POA: Diagnosis not present

## 2011-10-25 DIAGNOSIS — M1710 Unilateral primary osteoarthritis, unspecified knee: Secondary | ICD-10-CM | POA: Diagnosis not present

## 2011-10-25 DIAGNOSIS — Z7901 Long term (current) use of anticoagulants: Secondary | ICD-10-CM | POA: Diagnosis not present

## 2011-10-26 DIAGNOSIS — M1710 Unilateral primary osteoarthritis, unspecified knee: Secondary | ICD-10-CM | POA: Diagnosis not present

## 2011-10-26 DIAGNOSIS — Z7901 Long term (current) use of anticoagulants: Secondary | ICD-10-CM | POA: Diagnosis not present

## 2011-10-26 DIAGNOSIS — Z96659 Presence of unspecified artificial knee joint: Secondary | ICD-10-CM | POA: Diagnosis not present

## 2011-10-26 DIAGNOSIS — Z471 Aftercare following joint replacement surgery: Secondary | ICD-10-CM | POA: Diagnosis not present

## 2011-10-26 DIAGNOSIS — Z5181 Encounter for therapeutic drug level monitoring: Secondary | ICD-10-CM | POA: Diagnosis not present

## 2011-10-27 DIAGNOSIS — Z5181 Encounter for therapeutic drug level monitoring: Secondary | ICD-10-CM | POA: Diagnosis not present

## 2011-10-27 DIAGNOSIS — Z96659 Presence of unspecified artificial knee joint: Secondary | ICD-10-CM | POA: Diagnosis not present

## 2011-10-27 DIAGNOSIS — M1710 Unilateral primary osteoarthritis, unspecified knee: Secondary | ICD-10-CM | POA: Diagnosis not present

## 2011-10-27 DIAGNOSIS — Z471 Aftercare following joint replacement surgery: Secondary | ICD-10-CM | POA: Diagnosis not present

## 2011-10-27 DIAGNOSIS — Z7901 Long term (current) use of anticoagulants: Secondary | ICD-10-CM | POA: Diagnosis not present

## 2011-10-30 DIAGNOSIS — Z7901 Long term (current) use of anticoagulants: Secondary | ICD-10-CM | POA: Diagnosis not present

## 2011-10-30 DIAGNOSIS — Z471 Aftercare following joint replacement surgery: Secondary | ICD-10-CM | POA: Diagnosis not present

## 2011-10-30 DIAGNOSIS — Z96659 Presence of unspecified artificial knee joint: Secondary | ICD-10-CM | POA: Diagnosis not present

## 2011-10-30 DIAGNOSIS — M1710 Unilateral primary osteoarthritis, unspecified knee: Secondary | ICD-10-CM | POA: Diagnosis not present

## 2011-10-30 DIAGNOSIS — Z5181 Encounter for therapeutic drug level monitoring: Secondary | ICD-10-CM | POA: Diagnosis not present

## 2011-10-31 DIAGNOSIS — Z5181 Encounter for therapeutic drug level monitoring: Secondary | ICD-10-CM | POA: Diagnosis not present

## 2011-10-31 DIAGNOSIS — M1710 Unilateral primary osteoarthritis, unspecified knee: Secondary | ICD-10-CM | POA: Diagnosis not present

## 2011-10-31 DIAGNOSIS — Z471 Aftercare following joint replacement surgery: Secondary | ICD-10-CM | POA: Diagnosis not present

## 2011-10-31 DIAGNOSIS — Z96659 Presence of unspecified artificial knee joint: Secondary | ICD-10-CM | POA: Diagnosis not present

## 2011-10-31 DIAGNOSIS — Z7901 Long term (current) use of anticoagulants: Secondary | ICD-10-CM | POA: Diagnosis not present

## 2011-11-01 DIAGNOSIS — Z5181 Encounter for therapeutic drug level monitoring: Secondary | ICD-10-CM | POA: Diagnosis not present

## 2011-11-01 DIAGNOSIS — Z96659 Presence of unspecified artificial knee joint: Secondary | ICD-10-CM | POA: Diagnosis not present

## 2011-11-01 DIAGNOSIS — Z7901 Long term (current) use of anticoagulants: Secondary | ICD-10-CM | POA: Diagnosis not present

## 2011-11-01 DIAGNOSIS — M1710 Unilateral primary osteoarthritis, unspecified knee: Secondary | ICD-10-CM | POA: Diagnosis not present

## 2011-11-01 DIAGNOSIS — Z471 Aftercare following joint replacement surgery: Secondary | ICD-10-CM | POA: Diagnosis not present

## 2011-11-02 DIAGNOSIS — Z471 Aftercare following joint replacement surgery: Secondary | ICD-10-CM | POA: Diagnosis not present

## 2011-11-02 DIAGNOSIS — Z5181 Encounter for therapeutic drug level monitoring: Secondary | ICD-10-CM | POA: Diagnosis not present

## 2011-11-02 DIAGNOSIS — Z7901 Long term (current) use of anticoagulants: Secondary | ICD-10-CM | POA: Diagnosis not present

## 2011-11-02 DIAGNOSIS — Z96659 Presence of unspecified artificial knee joint: Secondary | ICD-10-CM | POA: Diagnosis not present

## 2011-11-02 DIAGNOSIS — M1710 Unilateral primary osteoarthritis, unspecified knee: Secondary | ICD-10-CM | POA: Diagnosis not present

## 2011-11-03 DIAGNOSIS — Z7901 Long term (current) use of anticoagulants: Secondary | ICD-10-CM | POA: Diagnosis not present

## 2011-11-03 DIAGNOSIS — Z5181 Encounter for therapeutic drug level monitoring: Secondary | ICD-10-CM | POA: Diagnosis not present

## 2011-11-03 DIAGNOSIS — M1710 Unilateral primary osteoarthritis, unspecified knee: Secondary | ICD-10-CM | POA: Diagnosis not present

## 2011-11-03 DIAGNOSIS — Z96659 Presence of unspecified artificial knee joint: Secondary | ICD-10-CM | POA: Diagnosis not present

## 2011-11-03 DIAGNOSIS — Z471 Aftercare following joint replacement surgery: Secondary | ICD-10-CM | POA: Diagnosis not present

## 2011-11-06 DIAGNOSIS — Z471 Aftercare following joint replacement surgery: Secondary | ICD-10-CM | POA: Diagnosis not present

## 2011-11-06 DIAGNOSIS — Z5181 Encounter for therapeutic drug level monitoring: Secondary | ICD-10-CM | POA: Diagnosis not present

## 2011-11-06 DIAGNOSIS — Z96659 Presence of unspecified artificial knee joint: Secondary | ICD-10-CM | POA: Diagnosis not present

## 2011-11-06 DIAGNOSIS — M1710 Unilateral primary osteoarthritis, unspecified knee: Secondary | ICD-10-CM | POA: Diagnosis not present

## 2011-11-06 DIAGNOSIS — Z7901 Long term (current) use of anticoagulants: Secondary | ICD-10-CM | POA: Diagnosis not present

## 2011-11-07 DIAGNOSIS — Z5181 Encounter for therapeutic drug level monitoring: Secondary | ICD-10-CM | POA: Diagnosis not present

## 2011-11-07 DIAGNOSIS — M1710 Unilateral primary osteoarthritis, unspecified knee: Secondary | ICD-10-CM | POA: Diagnosis not present

## 2011-11-07 DIAGNOSIS — Z7901 Long term (current) use of anticoagulants: Secondary | ICD-10-CM | POA: Diagnosis not present

## 2011-11-07 DIAGNOSIS — Z471 Aftercare following joint replacement surgery: Secondary | ICD-10-CM | POA: Diagnosis not present

## 2011-11-07 DIAGNOSIS — Z96659 Presence of unspecified artificial knee joint: Secondary | ICD-10-CM | POA: Diagnosis not present

## 2011-11-08 DIAGNOSIS — M1710 Unilateral primary osteoarthritis, unspecified knee: Secondary | ICD-10-CM | POA: Diagnosis not present

## 2011-11-08 DIAGNOSIS — Z96659 Presence of unspecified artificial knee joint: Secondary | ICD-10-CM | POA: Diagnosis not present

## 2011-11-08 DIAGNOSIS — Z471 Aftercare following joint replacement surgery: Secondary | ICD-10-CM | POA: Diagnosis not present

## 2011-11-08 DIAGNOSIS — Z5181 Encounter for therapeutic drug level monitoring: Secondary | ICD-10-CM | POA: Diagnosis not present

## 2011-11-08 DIAGNOSIS — Z7901 Long term (current) use of anticoagulants: Secondary | ICD-10-CM | POA: Diagnosis not present

## 2011-11-09 DIAGNOSIS — M1710 Unilateral primary osteoarthritis, unspecified knee: Secondary | ICD-10-CM | POA: Diagnosis not present

## 2011-11-09 DIAGNOSIS — Z471 Aftercare following joint replacement surgery: Secondary | ICD-10-CM | POA: Diagnosis not present

## 2011-11-09 DIAGNOSIS — Z7901 Long term (current) use of anticoagulants: Secondary | ICD-10-CM | POA: Diagnosis not present

## 2011-11-09 DIAGNOSIS — Z5181 Encounter for therapeutic drug level monitoring: Secondary | ICD-10-CM | POA: Diagnosis not present

## 2011-11-09 DIAGNOSIS — Z96659 Presence of unspecified artificial knee joint: Secondary | ICD-10-CM | POA: Diagnosis not present

## 2011-11-10 DIAGNOSIS — Z471 Aftercare following joint replacement surgery: Secondary | ICD-10-CM | POA: Diagnosis not present

## 2011-11-10 DIAGNOSIS — M1710 Unilateral primary osteoarthritis, unspecified knee: Secondary | ICD-10-CM | POA: Diagnosis not present

## 2011-11-10 DIAGNOSIS — Z5181 Encounter for therapeutic drug level monitoring: Secondary | ICD-10-CM | POA: Diagnosis not present

## 2011-11-10 DIAGNOSIS — Z7901 Long term (current) use of anticoagulants: Secondary | ICD-10-CM | POA: Diagnosis not present

## 2011-11-10 DIAGNOSIS — Z96659 Presence of unspecified artificial knee joint: Secondary | ICD-10-CM | POA: Diagnosis not present

## 2011-11-13 DIAGNOSIS — M1710 Unilateral primary osteoarthritis, unspecified knee: Secondary | ICD-10-CM | POA: Diagnosis not present

## 2011-11-13 DIAGNOSIS — Z96659 Presence of unspecified artificial knee joint: Secondary | ICD-10-CM | POA: Diagnosis not present

## 2011-11-13 DIAGNOSIS — Z7901 Long term (current) use of anticoagulants: Secondary | ICD-10-CM | POA: Diagnosis not present

## 2011-11-13 DIAGNOSIS — Z471 Aftercare following joint replacement surgery: Secondary | ICD-10-CM | POA: Diagnosis not present

## 2011-11-13 DIAGNOSIS — Z5181 Encounter for therapeutic drug level monitoring: Secondary | ICD-10-CM | POA: Diagnosis not present

## 2011-11-14 DIAGNOSIS — I10 Essential (primary) hypertension: Secondary | ICD-10-CM | POA: Diagnosis not present

## 2011-11-14 DIAGNOSIS — G894 Chronic pain syndrome: Secondary | ICD-10-CM | POA: Diagnosis not present

## 2011-11-17 DIAGNOSIS — Z5181 Encounter for therapeutic drug level monitoring: Secondary | ICD-10-CM | POA: Diagnosis not present

## 2011-11-17 DIAGNOSIS — M1710 Unilateral primary osteoarthritis, unspecified knee: Secondary | ICD-10-CM | POA: Diagnosis not present

## 2011-11-17 DIAGNOSIS — Z7901 Long term (current) use of anticoagulants: Secondary | ICD-10-CM | POA: Diagnosis not present

## 2011-11-17 DIAGNOSIS — Z96659 Presence of unspecified artificial knee joint: Secondary | ICD-10-CM | POA: Diagnosis not present

## 2011-11-17 DIAGNOSIS — Z471 Aftercare following joint replacement surgery: Secondary | ICD-10-CM | POA: Diagnosis not present

## 2011-11-29 DIAGNOSIS — M1380 Other specified arthritis, unspecified site: Secondary | ICD-10-CM | POA: Diagnosis not present

## 2011-11-29 DIAGNOSIS — I10 Essential (primary) hypertension: Secondary | ICD-10-CM | POA: Diagnosis not present

## 2011-12-22 DIAGNOSIS — M1710 Unilateral primary osteoarthritis, unspecified knee: Secondary | ICD-10-CM | POA: Diagnosis not present

## 2011-12-28 DIAGNOSIS — G894 Chronic pain syndrome: Secondary | ICD-10-CM | POA: Diagnosis not present

## 2011-12-28 DIAGNOSIS — I10 Essential (primary) hypertension: Secondary | ICD-10-CM | POA: Diagnosis not present

## 2011-12-28 DIAGNOSIS — Z1211 Encounter for screening for malignant neoplasm of colon: Secondary | ICD-10-CM | POA: Diagnosis not present

## 2012-01-19 DIAGNOSIS — M1710 Unilateral primary osteoarthritis, unspecified knee: Secondary | ICD-10-CM | POA: Diagnosis not present

## 2012-02-09 DIAGNOSIS — M1710 Unilateral primary osteoarthritis, unspecified knee: Secondary | ICD-10-CM | POA: Diagnosis not present

## 2012-03-22 DIAGNOSIS — M1710 Unilateral primary osteoarthritis, unspecified knee: Secondary | ICD-10-CM | POA: Diagnosis not present

## 2012-04-30 DIAGNOSIS — Z131 Encounter for screening for diabetes mellitus: Secondary | ICD-10-CM | POA: Diagnosis not present

## 2012-04-30 DIAGNOSIS — I10 Essential (primary) hypertension: Secondary | ICD-10-CM | POA: Diagnosis not present

## 2012-04-30 DIAGNOSIS — N529 Male erectile dysfunction, unspecified: Secondary | ICD-10-CM | POA: Diagnosis not present

## 2012-04-30 DIAGNOSIS — Z13220 Encounter for screening for lipoid disorders: Secondary | ICD-10-CM | POA: Diagnosis not present

## 2012-04-30 DIAGNOSIS — Z125 Encounter for screening for malignant neoplasm of prostate: Secondary | ICD-10-CM | POA: Diagnosis not present

## 2012-05-31 DIAGNOSIS — I10 Essential (primary) hypertension: Secondary | ICD-10-CM | POA: Diagnosis not present

## 2012-05-31 DIAGNOSIS — R9431 Abnormal electrocardiogram [ECG] [EKG]: Secondary | ICD-10-CM | POA: Diagnosis not present

## 2012-05-31 DIAGNOSIS — R011 Cardiac murmur, unspecified: Secondary | ICD-10-CM | POA: Diagnosis not present

## 2012-06-08 ENCOUNTER — Encounter (HOSPITAL_COMMUNITY): Payer: Self-pay | Admitting: Emergency Medicine

## 2012-06-08 ENCOUNTER — Emergency Department (HOSPITAL_COMMUNITY): Payer: Medicare Other

## 2012-06-08 ENCOUNTER — Emergency Department (HOSPITAL_COMMUNITY)
Admission: EM | Admit: 2012-06-08 | Discharge: 2012-06-08 | Disposition: A | Discharge status: Home / Self Care | Payer: Medicare Other | Attending: Emergency Medicine | Admitting: Emergency Medicine

## 2012-06-08 DIAGNOSIS — R51 Headache: Secondary | ICD-10-CM | POA: Insufficient documentation

## 2012-06-08 DIAGNOSIS — S060XAA Concussion with loss of consciousness status unknown, initial encounter: Secondary | ICD-10-CM | POA: Insufficient documentation

## 2012-06-08 DIAGNOSIS — S060X9A Concussion with loss of consciousness of unspecified duration, initial encounter: Secondary | ICD-10-CM

## 2012-06-08 DIAGNOSIS — Z86718 Personal history of other venous thrombosis and embolism: Secondary | ICD-10-CM | POA: Insufficient documentation

## 2012-06-08 DIAGNOSIS — I6529 Occlusion and stenosis of unspecified carotid artery: Secondary | ICD-10-CM | POA: Diagnosis not present

## 2012-06-08 DIAGNOSIS — W19XXXA Unspecified fall, initial encounter: Secondary | ICD-10-CM | POA: Insufficient documentation

## 2012-06-08 LAB — PROTIME-INR: Prothrombin Time: 13.5 seconds (ref 11.6–15.2)

## 2012-06-08 MED ORDER — IBUPROFEN 600 MG PO TABS: 600.0000 mg | ORAL_TABLET | Freq: Four times a day (QID) | ORAL | Status: AC | PRN

## 2012-06-08 MED ORDER — KETOROLAC TROMETHAMINE 60 MG/2ML IM SOLN
60.0000 mg | Freq: Once | INTRAMUSCULAR | Status: AC
  Administered 2012-06-08: 60 mg via INTRAMUSCULAR
  Filled 2012-06-08: qty 2

## 2012-06-08 MED ORDER — METOCLOPRAMIDE HCL 5 MG/ML IJ SOLN
10.0000 mg | Freq: Once | INTRAMUSCULAR | Status: AC
  Administered 2012-06-08: 10 mg via INTRAMUSCULAR
  Filled 2012-06-08: qty 2

## 2012-06-08 MED ORDER — HYDROCODONE-ACETAMINOPHEN 5-325 MG PO TABS: 2.0000 | ORAL_TABLET | Freq: Four times a day (QID) | ORAL | Status: AC | PRN

## 2012-06-08 NOTE — ED Notes (Signed)
Returned from CT.

## 2012-06-08 NOTE — ED Provider Notes (Addendum)
History     CSN: [id number]  Arrival date & time 06/08/12  4098   First MD Initiated Contact with Patient 06/08/12 [phone]      Chief Complaint  Patient presents with  . Headache    (Consider location/radiation/quality/duration/timing/severity/associated sxs/prior treatment) HPI Comments: Pt with hx of diabetes and HTN comes in with cc of headaches. Pt states that 2 days ago, he had a fall in dark where he hit his head extremely hard onto a hard surface. Pt had a large hematoma, which is slowly improving. Patient however has been having intermittent severe headaches, in the frontal region and in the occipital region. The headaches are throbbing, and have no specific precipitating or relieving factors. There is some phonophobia. Pt denies ant LOc, AMS, n/v/seizures/fevers, neck stiffness, visual changes. Pt is not actively on any anticoagulants or anti platelet agents.  Patient is a 56 y.o. male presenting with headaches.  Headache  Pertinent negatives include no shortness of breath.    Past Medical History  Diagnosis Date  . History of blood clots     to left arm  . Arthritis     bil knees, left hand  . 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 Left  hand  2011    cyst removed.  . Total knee arthroplasty 10/19/2011    Procedure: TOTAL KNEE ARTHROPLASTY;  Surgeon: Kennieth Rad;  Location: [facility] OR;  Service: Orthopedics;  Laterality: Left;    Family History  Problem Relation Age of Onset  . Diabetes Other   . Hypertension Other   . Anesthesia problems Neg Hx   . Hypotension Neg Hx   . Malignant hyperthermia Neg Hx   . Pseudochol deficiency Neg Hx     History  Substance Use Topics  . Smoking status: Former Games developer  . Smokeless tobacco: Not on file  . Alcohol Use: No      Review of Systems  Constitutional: Negative for activity change and appetite change.  Respiratory: Negative for  cough and shortness of breath.   Cardiovascular: Negative for chest pain.  Gastrointestinal: Negative for abdominal pain.  Genitourinary: Negative for dysuria.  Neurological: Positive for headaches. Negative for seizures, light-headedness and numbness.    Allergies  Review of patient's allergies indicates no known allergies.  Home Medications   Current Outpatient Rx  Name Route Sig Dispense Refill  . HYDROCODONE-ACETAMINOPHEN 10-325 MG PO TABS Oral Take 1 tablet by mouth 2 (two) times daily.    Marland Kitchen VALSARTAN-HYDROCHLOROTHIAZIDE 80-12.5 MG PO TABS Oral Take 1 tablet by mouth daily.      BP 146/88  Pulse 80  Temp 97.4 F (36.3 C) (Oral)  Resp 18  SpO2 95%  Physical Exam  Constitutional: He is oriented to person, place, and time. He appears well-developed.  HENT:  Head: Normocephalic and atraumatic.  Eyes: Conjunctivae and EOM are normal. Pupils are equal, round, and reactive to light.  Neck: Normal range of motion. Neck supple.  Cardiovascular: Normal rate and regular rhythm.   Pulmonary/Chest: Effort normal and breath sounds normal.  Abdominal: Soft. Bowel sounds are normal. He exhibits no distension. There is no tenderness. There is no rebound and no guarding.  Neurological: He is alert and oriented to person, place, and time. No cranial nerve deficit. Coordination normal.  Skin: Skin is warm.    ED Course  Procedures (including critical care time)   Labs  Reviewed  PROTIME-INR   Ct Head Wo Contrast  06/08/2012  *RADIOLOGY REPORT*  Clinical Data: Struck in the frontal region yesterday, now with frontal and occipital headaches.  CT HEAD WITHOUT CONTRAST  Technique:  Contiguous axial images were obtained from the base of the skull through the vertex without contrast.  Comparison: None.  Findings: Asymmetric lateral ventricles, right larger than left, felt to be developmental.  No hydrocephalus.  No mass lesion.  No midline shift.  No acute hemorrhage or hematoma.  No  extra-axial fluid collections.  No evidence of acute infarction.  No skull fracture or other focal osseous abnormality involving the skull.  Visualized paranasal sinuses, bilateral mastoid air cells, and bilateral middle ear cavities well-aerated.  Minimal right carotid siphon atherosclerosis.  IMPRESSION:  1.  No acute intracranial abnormality. 2.  Minimal right carotid siphon atherosclerosis.  Original Report Authenticated By: Arnell Sieving, M.D.     No diagnosis found.    MDM  DDX includes: Primary headaches - including migrainous headaches, cluster headaches, tension headaches. Concussion contusion ICH Carotid dissection Cavernous sinus thrombosis Meningitis Encephalitis Sinusitis Tumor Vascular headaches AV malformation Brain aneurysm Muscular headaches  A/P: Pt comes in with cc of headaches. Pt has no concerning red flags per hx or exam - this is likely a concussion type headaches from coup/counter coup injury. No concerns for life threatening secondary headaches at this time based on hx or exam. We will get a CT head due to severity of sx and a visible hematoma to r/o bleed.        Derwood Kaplan, MD 06/08/12 1107  11:57 AM Headaches improves s/p toradol and reglan IM. Will d/c.  Derwood Kaplan, MD 06/08/12 1200

## 2012-06-08 NOTE — ED Notes (Signed)
Pt c/o pain in back of head after getting upset last night; pt sts hit front of head 2 days ago but thought was okay; pt denies vision change or nausea but sts some noise sensitivity

## 2012-06-08 NOTE — ED Notes (Signed)
Patient transported to CT 

## 2012-06-17 DIAGNOSIS — R011 Cardiac murmur, unspecified: Secondary | ICD-10-CM | POA: Diagnosis not present

## 2012-06-17 DIAGNOSIS — I10 Essential (primary) hypertension: Secondary | ICD-10-CM | POA: Diagnosis not present

## 2012-06-17 DIAGNOSIS — R9431 Abnormal electrocardiogram [ECG] [EKG]: Secondary | ICD-10-CM | POA: Diagnosis not present

## 2012-06-28 DIAGNOSIS — I119 Hypertensive heart disease without heart failure: Secondary | ICD-10-CM | POA: Diagnosis not present

## 2012-06-28 DIAGNOSIS — R9431 Abnormal electrocardiogram [ECG] [EKG]: Secondary | ICD-10-CM | POA: Diagnosis not present

## 2012-06-28 DIAGNOSIS — I10 Essential (primary) hypertension: Secondary | ICD-10-CM | POA: Diagnosis not present

## 2012-06-28 DIAGNOSIS — R011 Cardiac murmur, unspecified: Secondary | ICD-10-CM | POA: Diagnosis not present

## 2012-07-05 DIAGNOSIS — M1710 Unilateral primary osteoarthritis, unspecified knee: Secondary | ICD-10-CM | POA: Diagnosis not present

## 2012-07-26 DIAGNOSIS — I10 Essential (primary) hypertension: Secondary | ICD-10-CM | POA: Diagnosis not present

## 2012-07-26 DIAGNOSIS — R011 Cardiac murmur, unspecified: Secondary | ICD-10-CM | POA: Diagnosis not present

## 2012-07-26 DIAGNOSIS — R9431 Abnormal electrocardiogram [ECG] [EKG]: Secondary | ICD-10-CM | POA: Diagnosis not present

## 2012-07-26 DIAGNOSIS — I119 Hypertensive heart disease without heart failure: Secondary | ICD-10-CM | POA: Diagnosis not present

## 2012-09-06 DIAGNOSIS — M1710 Unilateral primary osteoarthritis, unspecified knee: Secondary | ICD-10-CM | POA: Diagnosis not present

## 2012-10-11 DIAGNOSIS — R972 Elevated prostate specific antigen [PSA]: Secondary | ICD-10-CM | POA: Diagnosis not present

## 2012-10-11 DIAGNOSIS — N401 Enlarged prostate with lower urinary tract symptoms: Secondary | ICD-10-CM | POA: Diagnosis not present

## 2012-12-27 DIAGNOSIS — M1710 Unilateral primary osteoarthritis, unspecified knee: Secondary | ICD-10-CM | POA: Diagnosis not present

## 2013-02-10 DIAGNOSIS — M1710 Unilateral primary osteoarthritis, unspecified knee: Secondary | ICD-10-CM | POA: Diagnosis not present

## 2013-07-04 DIAGNOSIS — M1710 Unilateral primary osteoarthritis, unspecified knee: Secondary | ICD-10-CM | POA: Diagnosis not present

## 2013-09-05 DIAGNOSIS — M1710 Unilateral primary osteoarthritis, unspecified knee: Secondary | ICD-10-CM | POA: Diagnosis not present

## 2013-10-17 DIAGNOSIS — M1710 Unilateral primary osteoarthritis, unspecified knee: Secondary | ICD-10-CM | POA: Diagnosis not present

## 2013-12-19 DIAGNOSIS — IMO0002 Reserved for concepts with insufficient information to code with codable children: Secondary | ICD-10-CM | POA: Diagnosis not present

## 2013-12-19 DIAGNOSIS — M1710 Unilateral primary osteoarthritis, unspecified knee: Secondary | ICD-10-CM | POA: Diagnosis not present

## 2014-02-13 DIAGNOSIS — M1710 Unilateral primary osteoarthritis, unspecified knee: Secondary | ICD-10-CM | POA: Diagnosis not present

## 2014-02-13 DIAGNOSIS — IMO0002 Reserved for concepts with insufficient information to code with codable children: Secondary | ICD-10-CM | POA: Diagnosis not present

## 2014-04-01 ENCOUNTER — Encounter: Payer: Self-pay | Admitting: Internal Medicine

## 2014-04-01 ENCOUNTER — Ambulatory Visit: Payer: Medicare HMO | Attending: Internal Medicine | Admitting: Internal Medicine

## 2014-04-01 VITALS — BP 150/90 | HR 66 | Temp 98.8°F | Resp 17 | Wt 221.6 lb

## 2014-04-01 DIAGNOSIS — Z86718 Personal history of other venous thrombosis and embolism: Secondary | ICD-10-CM | POA: Diagnosis not present

## 2014-04-01 DIAGNOSIS — M1990 Unspecified osteoarthritis, unspecified site: Secondary | ICD-10-CM | POA: Insufficient documentation

## 2014-04-01 DIAGNOSIS — M25569 Pain in unspecified knee: Secondary | ICD-10-CM

## 2014-04-01 DIAGNOSIS — Z139 Encounter for screening, unspecified: Secondary | ICD-10-CM

## 2014-04-01 DIAGNOSIS — I10 Essential (primary) hypertension: Secondary | ICD-10-CM | POA: Diagnosis not present

## 2014-04-01 DIAGNOSIS — M19049 Primary osteoarthritis, unspecified hand: Secondary | ICD-10-CM | POA: Insufficient documentation

## 2014-04-01 DIAGNOSIS — M25561 Pain in right knee: Secondary | ICD-10-CM

## 2014-04-01 DIAGNOSIS — M1710 Unilateral primary osteoarthritis, unspecified knee: Secondary | ICD-10-CM | POA: Insufficient documentation

## 2014-04-01 DIAGNOSIS — Z87891 Personal history of nicotine dependence: Secondary | ICD-10-CM | POA: Insufficient documentation

## 2014-04-01 DIAGNOSIS — IMO0002 Reserved for concepts with insufficient information to code with codable children: Secondary | ICD-10-CM

## 2014-04-01 MED ORDER — AMLODIPINE BESYLATE 5 MG PO TABS: 5.0000 mg | ORAL_TABLET | Freq: Every day | ORAL | Status: DC

## 2014-04-01 NOTE — Progress Notes (Signed)
Patient Demographics  Dustin [name], is a 58 y.o. male  EML:[id number]  FHQ:[id number]  DOB - 17-Jul-1956  CC:  Chief Complaint  Patient presents with  . Establish Care       HPI: Dustin [name] is a 58 y.o. male here today to establish medical care. Patient has history of hypertension as per patient he is not taking any medication for that, in the system noted that patient used to be on Diovan HCT, as per patient he does not want to take any water pill her, today's blood pressure is elevated denies any headache dizziness chest and shortness of breath, he also has history of osteoarthritis and is following up with orthopedics had left knee surgery done in the past currently he has more pain in the right knee and is taking Norco and etodolac prescribed by his orthopedic surgeon. Patient has No headache, No chest pain, No abdominal pain - No Nausea, No new weakness tingling or numbness, No Cough - SOB.  No Known Allergies Past Medical History  Diagnosis Date  . History of blood clots     to left arm  . Arthritis     bil knees, left hand  . DVT of upper extremity (deep vein thrombosis)     left brachial vein, 12/2010   Current Outpatient Prescriptions on File Prior to Visit  Medication Sig Dispense Refill  . HYDROcodone-acetaminophen (NORCO) 10-325 MG per tablet Take 1 tablet by mouth 2 (two) times daily.      . valsartan-hydrochlorothiazide (DIOVAN-HCT) 80-12.5 MG per tablet Take 1 tablet by mouth daily.       No current facility-administered medications on file prior to visit.   Family History  Problem Relation Age of Onset  . Diabetes Other   . Hypertension Other   . Anesthesia problems Neg Hx   . Hypotension Neg Hx   . Malignant hyperthermia Neg Hx   . Pseudochol deficiency Neg Hx   . Cancer Mother   . Cancer Father     prostate cancer  . Diabetes Maternal Uncle    History   Social History  . Marital Status: Single    Spouse Name: N/A    Number of Children:  N/A  . Years of Education: N/A   Occupational History  . Not on file.   Social History Main Topics  . Smoking status: Former Research scientist (life sciences)  . Smokeless tobacco: Not on file  . Alcohol Use: No  . Drug Use: No  . Sexual Activity: Yes   Other Topics Concern  . Not on file   Social History Narrative  . No narrative on file    Review of Systems: Constitutional: Negative for fever, chills, diaphoresis, activity change, appetite change and fatigue. HENT: Negative for ear pain, nosebleeds, congestion, facial swelling, rhinorrhea, neck pain, neck stiffness and ear discharge.  Eyes: Negative for pain, discharge, redness, itching and visual disturbance. Respiratory: Negative for cough, choking, chest tightness, shortness of breath, wheezing and stridor.  Cardiovascular: Negative for chest pain, palpitations and leg swelling. Gastrointestinal: Negative for abdominal distention. Genitourinary: Negative for dysuria, urgency, frequency, hematuria, flank pain, decreased urine volume, difficulty urinating and dyspareunia.  Musculoskeletal: Negative for back pain, joint swelling, arthralgia and gait problem. Neurological: Negative for dizziness, tremors, seizures, syncope, facial asymmetry, speech difficulty, weakness, light-headedness, numbness and headaches.  Hematological: Negative for adenopathy. Does not bruise/bleed easily. Psychiatric/Behavioral: Negative for hallucinations, behavioral problems, confusion, dysphoric mood, decreased concentration and agitation.    Objective:   Filed Vitals:  04/01/14 1159  BP: 150/90  Pulse: 66  Temp: 98.8 F (37.1 C)  Resp: 17    Physical Exam: Constitutional: Patient appears well-developed and well-nourished. No distress. HENT: Normocephalic, atraumatic, External right and left ear normal. Oropharynx is clear and moist.  Eyes: Conjunctivae and EOM are normal. PERRLA, no scleral icterus. Neck: Normal ROM. Neck supple. No JVD. No tracheal deviation. No  thyromegaly. CVS: RRR, S1/S2 +, no murmurs, no gallops, no carotid bruit.  Pulmonary: Effort and breath sounds normal, no stridor, rhonchi, wheezes, rales.  Abdominal: Soft. BS +, no distension, tenderness, rebound or guarding.  Musculoskeletal: Normal range of motion, +tenderness and crepitation right knee Neuro: Alert. Normal reflexes, muscle tone coordination. No cranial nerve deficit. Skin: Skin is warm and dry. No rash noted. Not diaphoretic. No erythema. No pallor. Psychiatric: Normal mood and affect. Behavior, judgment, thought content normal.  Lab Results  Component Value Date   WBC 6.6 10/22/2011   HGB 11.0* 10/22/2011   HCT 32.3* 10/22/2011   MCV 87.1 10/22/2011   PLT 202 10/22/2011   Lab Results  Component Value Date   CREATININE 0.94 10/11/2011   BUN 26* 10/11/2011   NA 139 10/11/2011   K 4.2 10/11/2011   CL 103 10/11/2011   CO2 25 10/11/2011    No results found for this basename: HGBA1C   Lipid Panel  No results found for this basename: chol, trig, hdl, cholhdl, vldl, ldlcalc       Assessment and plan:   1. Essential hypertension, benign started patient on Norvasc 5 mg and also advise for DASH diet.  - amLODipine (NORVASC) 5 MG tablet; Take 1 tablet (5 mg total) by mouth daily.  Dispense: 90 tablet; Refill: 3  2. Osteoarthritis/3. Knee pain, right On pain medication following up with orthopedics.  4. Screening Fasting blood work.  - CBC with Differential - COMPLETE METABOLIC PANEL WITH GFR - TSH - Lipid panel      Health Maintenance -Colonoscopy: as per patient he had colonoscopy done 1 to 2 years ago   Return in about 3 months (around 07/02/2014) for hypertension, osteoarthritis.    Lorayne Marek, MD

## 2014-04-01 NOTE — Patient Instructions (Signed)
DASH Diet  The DASH diet stands for "Dietary Approaches to Stop Hypertension." It is a healthy eating plan that has been shown to reduce high blood pressure (hypertension) in as little as 14 days, while also possibly providing other significant health benefits. These other health benefits include reducing the risk of breast cancer after menopause and reducing the risk of type 2 diabetes, heart disease, colon cancer, and stroke. Health benefits also include weight loss and slowing kidney failure in patients with chronic kidney disease.   DIET GUIDELINES  · Limit salt (sodium). Your diet should contain less than 1500 mg of sodium daily.  · Limit refined or processed carbohydrates. Your diet should include mostly whole grains. Desserts and added sugars should be used sparingly.  · Include small amounts of heart-healthy fats. These types of fats include nuts, oils, and tub margarine. Limit saturated and trans fats. These fats have been shown to be harmful in the body.  CHOOSING FOODS   The following food groups are based on a 2000 calorie diet. See your Registered Dietitian for individual calorie needs.  Grains and Grain Products (6 to 8 servings daily)  · Eat More Often: Whole-wheat bread, brown rice, whole-grain or wheat pasta, quinoa, popcorn without added fat or salt (air popped).  · Eat Less Often: White bread, white pasta, white rice, cornbread.  Vegetables (4 to 5 servings daily)  · Eat More Often: Fresh, frozen, and canned vegetables. Vegetables may be raw, steamed, roasted, or grilled with a minimal amount of fat.  · Eat Less Often/Avoid: Creamed or fried vegetables. Vegetables in a cheese sauce.  Fruit (4 to 5 servings daily)  · Eat More Often: All fresh, canned (in natural juice), or frozen fruits. Dried fruits without added sugar. One hundred percent fruit juice (½ cup [237 mL] daily).  · Eat Less Often: Dried fruits with added sugar. Canned fruit in light or heavy syrup.  Lean Meats, Fish, and Poultry (2  servings or less daily. One serving is 3 to 4 oz [85-114 g]).  · Eat More Often: Ninety percent or leaner ground beef, tenderloin, sirloin. Round cuts of beef, chicken breast, turkey breast. All fish. Grill, bake, or broil your meat. Nothing should be fried.  · Eat Less Often/Avoid: Fatty cuts of meat, turkey, or chicken leg, thigh, or wing. Fried cuts of meat or fish.  Dairy (2 to 3 servings)  · Eat More Often: Low-fat or fat-free milk, low-fat plain or light yogurt, reduced-fat or part-skim cheese.  · Eat Less Often/Avoid: Milk (whole, 2%). Whole milk yogurt. Full-fat cheeses.  Nuts, Seeds, and Legumes (4 to 5 servings per week)  · Eat More Often: All without added salt.  · Eat Less Often/Avoid: Salted nuts and seeds, canned beans with added salt.  Fats and Sweets (limited)  · Eat More Often: Vegetable oils, tub margarines without trans fats, sugar-free gelatin. Mayonnaise and salad dressings.  · Eat Less Often/Avoid: Coconut oils, palm oils, butter, stick margarine, cream, half and half, cookies, candy, pie.  FOR MORE INFORMATION  The Dash Diet Eating Plan: www.dashdiet.org  Document Released: 10/05/2011 Document Revised: 01/08/2012 Document Reviewed: 10/05/2011  ExitCare® Patient Information ©2014 [employer].

## 2014-04-01 NOTE — Progress Notes (Signed)
Patient here to establish care And for a physical Has history of knee pain  To both knees

## 2014-04-02 LAB — CBC WITH DIFFERENTIAL/PLATELET
BASOS ABS: 0 10*3/uL (ref 0.0–0.1)
Basophils Relative: 0 % (ref 0–1)
Eosinophils Absolute: 0.2 10*3/uL (ref 0.0–0.7)
Eosinophils Relative: 5 % (ref 0–5)
HEMATOCRIT: 38.9 % — AB (ref 39.0–52.0)
HEMOGLOBIN: 13.5 g/dL (ref 13.0–17.0)
LYMPHS PCT: 33 % (ref 12–46)
Lymphs Abs: 1 10*3/uL (ref 0.7–4.0)
MCH: 29.8 pg (ref 26.0–34.0)
MCHC: 34.7 g/dL (ref 30.0–36.0)
MCV: 85.9 fL (ref 78.0–100.0)
MONO ABS: 0.4 10*3/uL (ref 0.1–1.0)
MONOS PCT: 14 % — AB (ref 3–12)
NEUTROS ABS: 1.5 10*3/uL — AB (ref 1.7–7.7)
NEUTROS PCT: 48 % (ref 43–77)
Platelets: 228 10*3/uL (ref 150–400)
RBC: 4.53 MIL/uL (ref 4.22–5.81)
RDW: 14.2 % (ref 11.5–15.5)
WBC: 3.1 10*3/uL — AB (ref 4.0–10.5)

## 2014-04-02 LAB — COMPLETE METABOLIC PANEL WITH GFR
ALBUMIN: 4.4 g/dL (ref 3.5–5.2)
ALT: 43 U/L (ref 0–53)
AST: 43 U/L — AB (ref 0–37)
Alkaline Phosphatase: 45 U/L (ref 39–117)
BUN: 11 mg/dL (ref 6–23)
CALCIUM: 9.3 mg/dL (ref 8.4–10.5)
CHLORIDE: 102 meq/L (ref 96–112)
CO2: 26 mEq/L (ref 19–32)
Creat: 1.01 mg/dL (ref 0.50–1.35)
GFR, Est African American: >89 mL/min
GFR, Est Non African American: 82 mL/min
Glucose, Bld: 80 mg/dL (ref 70–99)
POTASSIUM: 3.6 meq/L (ref 3.5–5.3)
SODIUM: 137 meq/L (ref 135–145)
TOTAL PROTEIN: 7.2 g/dL (ref 6.0–8.3)
Total Bilirubin: 1.8 mg/dL — ABNORMAL HIGH (ref 0.2–1.2)

## 2014-04-02 LAB — LIPID PANEL
CHOL/HDL RATIO: 3.1 ratio
Cholesterol: 183 mg/dL (ref 0–200)
HDL: 60 mg/dL (ref >39)
LDL CALC: 114 mg/dL — AB (ref 0–99)
Triglycerides: 43 mg/dL (ref <150)
VLDL: 9 mg/dL (ref 0–40)

## 2014-04-02 LAB — TSH: TSH: 0.695 u[IU]/mL (ref 0.350–4.500)

## 2014-06-30 ENCOUNTER — Encounter: Payer: Self-pay | Admitting: Internal Medicine

## 2014-06-30 ENCOUNTER — Ambulatory Visit: Payer: Medicare HMO | Attending: Internal Medicine | Admitting: Internal Medicine

## 2014-06-30 VITALS — BP 149/84 | HR 66 | Temp 98.4°F | Resp 15 | Wt 217.6 lb

## 2014-06-30 DIAGNOSIS — M1990 Unspecified osteoarthritis, unspecified site: Secondary | ICD-10-CM | POA: Diagnosis not present

## 2014-06-30 DIAGNOSIS — I10 Essential (primary) hypertension: Secondary | ICD-10-CM

## 2014-06-30 DIAGNOSIS — Z87891 Personal history of nicotine dependence: Secondary | ICD-10-CM | POA: Diagnosis not present

## 2014-06-30 MED ORDER — AMLODIPINE BESYLATE 5 MG PO TABS: 5.0000 mg | ORAL_TABLET | Freq: Every day | ORAL | Status: DC

## 2014-06-30 NOTE — Progress Notes (Signed)
Patient here for follow up on his HTN 

## 2014-06-30 NOTE — Patient Instructions (Signed)
DASH Eating Plan °DASH stands for "Dietary Approaches to Stop Hypertension." The DASH eating plan is a healthy eating plan that has been shown to reduce high blood pressure (hypertension). Additional health benefits may include reducing the risk of type 2 diabetes mellitus, heart disease, and stroke. The DASH eating plan may also help with weight loss. °WHAT DO I NEED TO KNOW ABOUT THE DASH EATING PLAN? °For the DASH eating plan, you will follow these general guidelines: °· Choose foods with a percent daily value for sodium of less than 5% (as listed on the food label). °· Use salt-free seasonings or herbs instead of table salt or sea salt. °· Check with your health care provider or pharmacist before using salt substitutes. °· Eat lower-sodium products, often labeled as "lower sodium" or "no salt added." °· Eat fresh foods. °· Eat more vegetables, fruits, and low-fat dairy products. °· Choose whole grains. Look for the word "whole" as the first word in the ingredient list. °· Choose fish and skinless chicken or turkey more often than red meat. Limit fish, poultry, and meat to 6 oz (170 g) each day. °· Limit sweets, desserts, sugars, and sugary drinks. °· Choose heart-healthy fats. °· Limit cheese to 1 oz (28 g) per day. °· Eat more home-cooked food and less restaurant, buffet, and fast food. °· Limit fried foods. °· Cook foods using methods other than frying. °· Limit canned vegetables. If you do use them, rinse them well to decrease the sodium. °· When eating at a restaurant, ask that your food be prepared with less salt, or no salt if possible. °WHAT FOODS CAN I EAT? °Seek help from a dietitian for individual calorie needs. °Grains °Whole grain or whole wheat bread. Brown rice. Whole grain or whole wheat pasta. Quinoa, bulgur, and whole grain cereals. Low-sodium cereals. Corn or whole wheat flour tortillas. Whole grain cornbread. Whole grain crackers. Low-sodium crackers. °Vegetables °Fresh or frozen vegetables  (raw, steamed, roasted, or grilled). Low-sodium or reduced-sodium tomato and vegetable juices. Low-sodium or reduced-sodium tomato sauce and paste. Low-sodium or reduced-sodium canned vegetables.  °Fruits °All fresh, canned (in natural juice), or frozen fruits. °Meat and Other Protein Products °Ground beef (85% or leaner), grass-fed beef, or beef trimmed of fat. Skinless chicken or turkey. Ground chicken or turkey. Pork trimmed of fat. All fish and seafood. Eggs. Dried beans, peas, or lentils. Unsalted nuts and seeds. Unsalted canned beans. °Dairy °Low-fat dairy products, such as skim or 1% milk, 2% or reduced-fat cheeses, low-fat ricotta or cottage cheese, or plain low-fat yogurt. Low-sodium or reduced-sodium cheeses. °Fats and Oils °Tub margarines without trans fats. Light or reduced-fat mayonnaise and salad dressings (reduced sodium). Avocado. Safflower, olive, or canola oils. Natural peanut or almond butter. °Other °Unsalted popcorn and pretzels. °The items listed above may not be a complete list of recommended foods or beverages. Contact your dietitian for more options. °WHAT FOODS ARE NOT RECOMMENDED? °Grains °White bread. White pasta. White rice. Refined cornbread. Bagels and croissants. Crackers that contain trans fat. °Vegetables °Creamed or fried vegetables. Vegetables in a cheese sauce. Regular canned vegetables. Regular canned tomato sauce and paste. Regular tomato and vegetable juices. °Fruits °Dried fruits. Canned fruit in light or heavy syrup. Fruit juice. °Meat and Other Protein Products °Fatty cuts of meat. Ribs, chicken wings, bacon, sausage, bologna, salami, chitterlings, fatback, hot dogs, bratwurst, and packaged luncheon meats. Salted nuts and seeds. Canned beans with salt. °Dairy °Whole or 2% milk, cream, half-and-half, and cream cheese. Whole-fat or sweetened yogurt. Full-fat   cheeses or blue cheese. Nondairy creamers and whipped toppings. Processed cheese, cheese spreads, or cheese  curds. °Condiments °Onion and garlic salt, seasoned salt, table salt, and sea salt. Canned and packaged gravies. Worcestershire sauce. Tartar sauce. Barbecue sauce. Teriyaki sauce. Soy sauce, including reduced sodium. Steak sauce. Fish sauce. Oyster sauce. Cocktail sauce. Horseradish. Ketchup and mustard. Meat flavorings and tenderizers. Bouillon cubes. Hot sauce. Tabasco sauce. Marinades. Taco seasonings. Relishes. °Fats and Oils °Butter, stick margarine, lard, shortening, ghee, and bacon fat. Coconut, palm kernel, or palm oils. Regular salad dressings. °Other °Pickles and olives. Salted popcorn and pretzels. °The items listed above may not be a complete list of foods and beverages to avoid. Contact your dietitian for more information. °WHERE CAN I FIND MORE INFORMATION? °National Heart, [employer], and [employer] Institute: www.nhlbi.nih.gov/health/health-topics/topics/dash/ °Document Released: 10/05/2011 Document Revised: 03/02/2014 Document Reviewed: 08/20/2013 °ExitCare® Patient Information ©2015 [employer]. This information is not intended to replace advice given to you by your health care provider. Make sure you discuss any questions you have with your health care provider. ° °

## 2014-06-30 NOTE — Progress Notes (Signed)
MRN: [id number] Name: Dustin [name]  Sex: male Age: 58 y.o. DOB: April 26, 1956  Allergies: Review of patient's allergies indicates no known allergies.  Chief Complaint  Patient presents with  . Follow-up    HPI: Patient is 58 y.o. male who has history of hypertension osteoarthritis comes today for followup, as per patient he then out of his medications today his blood pressure is elevated, denies any headache dizziness chest and shortness of breath. Patient had a blood work done which was reviewed with him.  Past Medical History  Diagnosis Date  . History of blood clots     to left arm  . Arthritis     bil knees, left hand  . DVT of upper extremity (deep vein thrombosis)     left brachial vein, 12/2010    Past Surgical History  Procedure Laterality Date  . Left  hand   2011    cyst removed.  . Total knee arthroplasty  10/19/2011    Procedure: TOTAL KNEE ARTHROPLASTY;  Surgeon: Sharmon Revere;  Location: [place];  Service: Orthopedics;  Laterality: Left;  . Colonscopy       1 year ago       Medication List       This list is accurate as of: 06/30/14  5:12 PM.  Always use your most recent med list.               amLODipine 5 MG tablet  Commonly known as:  NORVASC  Take 1 tablet (5 mg total) by mouth daily.     etodolac 400 MG tablet  Commonly known as:  LODINE     HYDROcodone-acetaminophen 10-325 MG per tablet  Commonly known as:  NORCO  Take 1 tablet by mouth 2 (two) times daily.        Meds ordered this encounter  Medications  . amLODipine (NORVASC) 5 MG tablet    Sig: Take 1 tablet (5 mg total) by mouth daily.    Dispense:  90 tablet    Refill:  1     There is no immunization history on file for this patient.  Family History  Problem Relation Age of Onset  . Diabetes Other   . Hypertension Other   . Anesthesia problems Neg Hx   . Hypotension Neg Hx   . Malignant hyperthermia Neg Hx   . Pseudochol deficiency Neg Hx   . Cancer Mother   .  Cancer Father     prostate cancer  . Diabetes Maternal Uncle     History  Substance Use Topics  . Smoking status: Former Research scientist (life sciences)  . Smokeless tobacco: Not on file  . Alcohol Use: No    Review of Systems   As noted in HPI  Filed Vitals:   06/30/14 1628  BP: 149/84  Pulse: 66  Temp: 98.4 F (36.9 C)  Resp: 15    Physical Exam  Physical Exam  Constitutional: No distress.  Eyes: EOM are normal. Pupils are equal, round, and reactive to light.  Cardiovascular: Normal rate and regular rhythm.   Pulmonary/Chest: Breath sounds normal. No respiratory distress. He has no wheezes. He has no rales.  Musculoskeletal: He exhibits no edema.    CBC    Component Value Date/Time   WBC 3.1* 04/01/2014 1226   RBC 4.53 04/01/2014 1226   HGB 13.5 04/01/2014 1226   HCT 38.9* 04/01/2014 1226   PLT 228 04/01/2014 1226   MCV 85.9 04/01/2014 1226   LYMPHSABS 1.0  04/01/2014 1226   MONOABS 0.4 04/01/2014 1226   EOSABS 0.2 04/01/2014 1226   BASOSABS 0.0 04/01/2014 1226    CMP     Component Value Date/Time   NA 137 04/01/2014 1226   K 3.6 04/01/2014 1226   CL 102 04/01/2014 1226   CO2 26 04/01/2014 1226   GLUCOSE 80 04/01/2014 1226   BUN 11 04/01/2014 1226   CREATININE 1.01 04/01/2014 1226   CREATININE 0.94 10/11/2011 1116   CALCIUM 9.3 04/01/2014 1226   PROT 7.2 04/01/2014 1226   ALBUMIN 4.4 04/01/2014 1226   AST 43* 04/01/2014 1226   ALT 43 04/01/2014 1226   ALKPHOS 45 04/01/2014 1226   BILITOT 1.8* 04/01/2014 1226   GFRNONAA 82 04/01/2014 1226   GFRNONAA >90 10/11/2011 1116   GFRAA >89 04/01/2014 1226   GFRAA >90 10/11/2011 1116    Lab Results  Component Value Date/Time   CHOL 183 04/01/2014 12:26 PM    No components found with this basename: hga1c    Lab Results  Component Value Date/Time   AST 43* 04/01/2014 12:26 PM    Assessment and Plan  Essential hypertension, benign - Plan: Advised patient for DASH diet, resume back on amLODipine (NORVASC) 5 MG tablet  Patient is following up with orthopedics for  arthritis.  Return in about 4 months (around 10/30/2014) for hypertension.  Lorayne Marek, MD

## 2014-10-03 ENCOUNTER — Encounter (HOSPITAL_COMMUNITY): Payer: Self-pay | Admitting: Nurse Practitioner

## 2014-10-03 ENCOUNTER — Emergency Department (HOSPITAL_COMMUNITY)
Admission: EM | Admit: 2014-10-03 | Discharge: 2014-10-03 | Disposition: A | Discharge status: Home / Self Care | Payer: Medicare HMO | Attending: Emergency Medicine | Admitting: Emergency Medicine

## 2014-10-03 ENCOUNTER — Emergency Department (HOSPITAL_COMMUNITY): Payer: Medicare HMO

## 2014-10-03 DIAGNOSIS — Z87891 Personal history of nicotine dependence: Secondary | ICD-10-CM | POA: Diagnosis not present

## 2014-10-03 DIAGNOSIS — M179 Osteoarthritis of knee, unspecified: Secondary | ICD-10-CM | POA: Diagnosis not present

## 2014-10-03 DIAGNOSIS — R05 Cough: Secondary | ICD-10-CM

## 2014-10-03 DIAGNOSIS — B349 Viral infection, unspecified: Secondary | ICD-10-CM

## 2014-10-03 DIAGNOSIS — Z79899 Other long term (current) drug therapy: Secondary | ICD-10-CM | POA: Diagnosis not present

## 2014-10-03 DIAGNOSIS — R509 Fever, unspecified: Secondary | ICD-10-CM

## 2014-10-03 DIAGNOSIS — M19042 Primary osteoarthritis, left hand: Secondary | ICD-10-CM | POA: Insufficient documentation

## 2014-10-03 DIAGNOSIS — Z86718 Personal history of other venous thrombosis and embolism: Secondary | ICD-10-CM | POA: Insufficient documentation

## 2014-10-03 DIAGNOSIS — R059 Cough, unspecified: Secondary | ICD-10-CM

## 2014-10-03 DIAGNOSIS — R52 Pain, unspecified: Secondary | ICD-10-CM | POA: Diagnosis present

## 2014-10-03 LAB — CBC WITH DIFFERENTIAL/PLATELET
BASOS ABS: 0 10*3/uL (ref 0.0–0.1)
Basophils Relative: 0 % (ref 0–1)
Eosinophils Absolute: 0 10*3/uL (ref 0.0–0.7)
Eosinophils Relative: 0 % (ref 0–5)
HCT: 40.4 % (ref 39.0–52.0)
Hemoglobin: 13.7 g/dL (ref 13.0–17.0)
LYMPHS ABS: 1.2 10*3/uL (ref 0.7–4.0)
LYMPHS PCT: 21 % (ref 12–46)
MCH: 29.9 pg (ref 26.0–34.0)
MCHC: 33.9 g/dL (ref 30.0–36.0)
MCV: 88.2 fL (ref 78.0–100.0)
Monocytes Absolute: 0.6 10*3/uL (ref 0.1–1.0)
Monocytes Relative: 10 % (ref 3–12)
NEUTROS ABS: 4 10*3/uL (ref 1.7–7.7)
NEUTROS PCT: 69 % (ref 43–77)
PLATELETS: 203 10*3/uL (ref 150–400)
RBC: 4.58 MIL/uL (ref 4.22–5.81)
RDW: 13.1 % (ref 11.5–15.5)
WBC: 5.7 10*3/uL (ref 4.0–10.5)

## 2014-10-03 LAB — BASIC METABOLIC PANEL
ANION GAP: 15 (ref 5–15)
BUN: 15 mg/dL (ref 6–23)
CALCIUM: 9.3 mg/dL (ref 8.4–10.5)
CO2: 23 meq/L (ref 19–32)
Chloride: 96 mEq/L (ref 96–112)
Creatinine, Ser: 1.02 mg/dL (ref 0.50–1.35)
GFR calc Af Amer: >90 mL/min (ref >90)
GFR calc non Af Amer: 79 mL/min — ABNORMAL LOW (ref >90)
GLUCOSE: 114 mg/dL — AB (ref 70–99)
POTASSIUM: 4.3 meq/L (ref 3.7–5.3)
SODIUM: 134 meq/L — AB (ref 137–147)

## 2014-10-03 LAB — URINALYSIS, ROUTINE W REFLEX MICROSCOPIC
GLUCOSE, UA: NEGATIVE mg/dL
Hgb urine dipstick: NEGATIVE
KETONES UR: 15 mg/dL — AB
NITRITE: NEGATIVE
PH: 7.5 (ref 5.0–8.0)
PROTEIN: 30 mg/dL — AB
Specific Gravity, Urine: 1.024 (ref 1.005–1.030)
Urobilinogen, UA: 1 mg/dL (ref 0.0–1.0)

## 2014-10-03 LAB — URINE MICROSCOPIC-ADD ON

## 2014-10-03 MED ORDER — ACETAMINOPHEN 500 MG PO TABS
1000.0000 mg | ORAL_TABLET | Freq: Once | ORAL | Status: AC
  Administered 2014-10-03: 1000 mg via ORAL

## 2014-10-03 MED ORDER — KETOROLAC TROMETHAMINE 30 MG/ML IJ SOLN
30.0000 mg | Freq: Once | INTRAMUSCULAR | Status: AC
  Administered 2014-10-03: 30 mg via INTRAVENOUS

## 2014-10-03 MED ORDER — ACETAMINOPHEN 325 MG PO TABS: 650.0000 mg | ORAL_TABLET | Freq: Four times a day (QID) | ORAL | Status: DC | PRN

## 2014-10-03 MED ORDER — SODIUM CHLORIDE 0.9 % IV BOLUS (SEPSIS)
1000.0000 mL | Freq: Once | INTRAVENOUS | Status: AC
  Administered 2014-10-03: 1000 mL via INTRAVENOUS

## 2014-10-03 NOTE — Discharge Instructions (Signed)
You may alternate Tylenol 1000 g every 6 hours and ibuprofen 800 mg every 8 hours as needed for fever and pain. Please continue to drink plenty of fluids. Your symptoms may last 7-10 days. Please return to the emergency department if you develop chest pain, shortness of breath, vomiting and cannot stop, severe headache, neck pain or neck stiffness but does not improve with Tylenol and ibuprofen.   Fever, Adult A fever is a higher than normal body temperature. In an adult, an oral temperature around 98.6 F (37 C) is considered normal. A temperature of 100.4 F (38 C) or higher is generally considered a fever. Mild or moderate fevers generally have no long-term effects and often do not require treatment. Extreme fever (greater than or equal to 106 F or 41.1 C) can cause seizures. The sweating that may occur with repeated or prolonged fever may cause dehydration. Elderly people can develop confusion during a fever. A measured temperature can vary with:  Age.  Time of day.  Method of measurement (mouth, underarm, rectal, or ear). The fever is confirmed by taking a temperature with a thermometer. Temperatures can be taken different ways. Some methods are accurate and some are not.  An oral temperature is used most commonly. Electronic thermometers are fast and accurate.  An ear temperature will only be accurate if the thermometer is positioned as recommended by the manufacturer.  A rectal temperature is accurate and done for those adults who have a condition where an oral temperature cannot be taken.  An underarm (axillary) temperature is not accurate and not recommended. Fever is a symptom, not a disease.  CAUSES   Infections commonly cause fever.  Some noninfectious causes for fever include:  Some arthritis conditions.  Some thyroid or adrenal gland conditions.  Some immune system conditions.  Some types of cancer.  A medicine reaction.  High doses of certain street drugs such  as methamphetamine.  Dehydration.  Exposure to high outside or room temperatures.  Occasionally, the source of a fever cannot be determined. This is sometimes called a "fever of unknown origin" (FUO).  Some situations may lead to a temporary rise in body temperature that may go away on its own. Examples are:  Childbirth.  Surgery.  Intense exercise. HOME CARE INSTRUCTIONS   Take appropriate medicines for fever. Follow dosing instructions carefully. If you use acetaminophen to reduce the fever, be careful to avoid taking other medicines that also contain acetaminophen. Do not take aspirin for a fever if you are younger than age 87. There is an association with Reye's syndrome. Reye's syndrome is a rare but potentially deadly disease.  If an infection is present and antibiotics have been prescribed, take them as directed. Finish them even if you start to feel better.  Rest as needed.  Maintain an adequate fluid intake. To prevent dehydration during an illness with prolonged or recurrent fever, you may need to drink extra fluid.Drink enough fluids to keep your urine clear or pale yellow.  Sponging or bathing with room temperature water may help reduce body temperature. Do not use ice water or alcohol sponge baths.  Dress comfortably, but do not over-bundle. SEEK MEDICAL CARE IF:   You are unable to keep fluids down.  You develop vomiting or diarrhea.  You are not feeling at least partly better after 3 days.  You develop new symptoms or problems. SEEK IMMEDIATE MEDICAL CARE IF:   You have shortness of breath or trouble breathing.  You develop excessive weakness.  You are dizzy or you faint.  You are extremely thirsty or you are making little or no urine.  You develop new pain that was not there before (such as in the head, neck, chest, back, or abdomen).  You have persistent vomiting and diarrhea for more than 1 to 2 days.  You develop a stiff neck or your eyes become  sensitive to light.  You develop a skin rash.  You have a fever or persistent symptoms for more than 2 to 3 days.  You have a fever and your symptoms suddenly get worse. MAKE SURE YOU:   Understand these instructions.  Will watch your condition.  Will get help right away if you are not doing well or get worse. Document Released: 04/11/2001 Document Revised: 03/02/2014 Document Reviewed: 08/17/2011 [facility] Patient Information 2015 [place], [place]. This information is not intended to replace advice given to you by your health care provider. Make sure you discuss any questions you have with your health care provider.   Possible Influenza Influenza ("the flu") is a viral infection of the respiratory tract. It occurs more often in winter months because people spend more time in close contact with one another. Influenza can make you feel very sick. Influenza easily spreads from person to person (contagious). CAUSES  Influenza is caused by a virus that infects the respiratory tract. You can catch the virus by breathing in droplets from an infected person's cough or sneeze. You can also catch the virus by touching something that was recently contaminated with the virus and then touching your mouth, nose, or eyes. RISKS AND COMPLICATIONS You may be at risk for a more severe case of influenza if you smoke cigarettes, have diabetes, have chronic heart disease (such as heart failure) or lung disease (such as asthma), or if you have a weakened immune system. Elderly people and pregnant women are also at risk for more serious infections. The most common problem of influenza is a lung infection (pneumonia). Sometimes, this problem can require emergency medical care and may be life threatening. SIGNS AND SYMPTOMS  Symptoms typically last 4 to 10 days and may include:  Fever.  Chills.  Headache, body aches, and muscle aches.  Sore throat.  Chest discomfort and cough.  Poor appetite.  Weakness  or feeling tired.  Dizziness.  Nausea or vomiting. DIAGNOSIS  Diagnosis of influenza is often made based on your history and a physical exam. A nose or throat swab test can be done to confirm the diagnosis. TREATMENT  In mild cases, influenza goes away on its own. Treatment is directed at relieving symptoms. For more severe cases, your health care provider may prescribe antiviral medicines to shorten the sickness. Antibiotic medicines are not effective because the infection is caused by a virus, not by bacteria. HOME CARE INSTRUCTIONS  Take medicines only as directed by your health care provider.  Use a cool mist humidifier to make breathing easier.  Get plenty of rest until your temperature returns to normal. This usually takes 3 to 4 days.  Drink enough fluid to keep your urine clear or pale yellow.  Cover yourmouth and nosewhen coughing or sneezing,and wash your handswellto prevent thevirusfrom spreading.  Stay homefromwork orschool untilthe fever is gonefor at least 57full day. PREVENTION  An annual influenza vaccination (flu shot) is the best way to avoid getting influenza. An annual flu shot is now routinely recommended for all adults in the [place] IF:  You experiencechest pain, yourcough worsens,or you producemore  mucus.  Youhave nausea,vomiting, ordiarrhea.  Your fever returns or gets worse. SEEK IMMEDIATE MEDICAL CARE IF:  You havetrouble breathing, you become short of breath,or your skin ornails becomebluish.  You have severe painor stiffnessin the neck.  You develop a sudden headache, or pain in the face or ear.  You have nausea or vomiting that you cannot control. MAKE SURE YOU:   Understand these instructions.  Will watch your condition.  Will get help right away if you are not doing well or get worse. Document Released: 10/13/2000 Document Revised: 03/02/2014 Document Reviewed: 01/15/2012 [facility] Patient  Information 2015 [place], [place]. This information is not intended to replace advice given to you by your health care provider. Make sure you discuss any questions you have with your health care provider.

## 2014-10-03 NOTE — ED Notes (Signed)
He c/o cough with clear sputum, chills and body aches since last night. He is concerned for the flu

## 2014-10-03 NOTE — ED Provider Notes (Signed)
TIME SEEN: 11:23 AM  CHIEF COMPLAINT: Fevers, chills, cough   HPI: Pt is a 58 y.o. M with history of osteoarthritis, prior history of left upper extremity DVT no longer on anticoagulation, prior tobacco use who presents to the emergency department with fevers, chills that started last night, cough with yellow sputum production, body aches. No headache, neck pain or neck stiffness. No vomiting or diarrhea. No rash. No sick contacts or recent travel. No tick bite. He did not have an influenza vaccination this year. No chest pain or shortness of breath.  ROS: See HPI Constitutional:  fever  Eyes: no drainage  ENT: no runny nose   Cardiovascular:  no chest pain  Resp: no SOB  GI: no vomiting GU: no dysuria Integumentary: no rash  Allergy: no hives  Musculoskeletal: no leg swelling  Neurological: no slurred speech ROS otherwise negative  PAST MEDICAL HISTORY/PAST SURGICAL HISTORY:  Past Medical History  Diagnosis Date  . History of blood clots     to left arm  . Arthritis     bil knees, left hand  . DVT of upper extremity (deep vein thrombosis)     left brachial vein, 12/2010    MEDICATIONS:  Prior to Admission medications   Medication Sig Start Date End Date Taking? Authorizing Provider  amLODipine (NORVASC) 5 MG tablet Take 1 tablet (5 mg total) by mouth daily. 06/30/14   Lorayne Marek, MD  etodolac (LODINE) 400 MG tablet  03/02/14   Historical Provider, MD  HYDROcodone-acetaminophen (NORCO) 10-325 MG per tablet Take 1 tablet by mouth 2 (two) times daily.    Historical Provider, MD    ALLERGIES:  No Known Allergies  SOCIAL HISTORY:  History  Substance Use Topics  . Smoking status: Former Research scientist (life sciences)  . Smokeless tobacco: Not on file  . Alcohol Use: No    FAMILY HISTORY: Family History  Problem Relation Age of Onset  . Diabetes Other   . Hypertension Other   . Anesthesia problems Neg Hx   . Hypotension Neg Hx   . Malignant hyperthermia Neg Hx   . Pseudochol deficiency Neg  Hx   . Cancer Mother   . Cancer Father     prostate cancer  . Diabetes Maternal Uncle     EXAM: BP 142/74 mmHg  Pulse 97  Temp(Src) 103.2 F (39.6 C) (Oral)  Resp 18  SpO2 96% CONSTITUTIONAL: Alert and oriented and responds appropriately to questions. Appears uncomfortable but is nontoxic HEAD: Normocephalic EYES: Conjunctivae clear, PERRL ENT: normal nose; no rhinorrhea; moist mucous membranes; pharynx without lesions noted NECK: Supple, no meningismus, no LAD  CARD: Regular and tachycardic; S1 and S2 appreciated; no murmurs, no clicks, no rubs, no gallops RESP: Normal chest excursion without splinting or tachypnea; breath sounds clear and equal bilaterally; no wheezes, no rhonchi, no rales, no hypoxia or respiratory distress ABD/GI: Normal bowel sounds; non-distended; soft, non-tender, no rebound, no guarding BACK:  The back appears normal and is non-tender to palpation, there is no CVA tenderness EXT: Normal ROM in all joints; non-tender to palpation; no edema; normal capillary refill; no cyanosis    SKIN: Normal color for age and race; warm, no rash NEURO: Moves all extremities equally PSYCH: The patient's mood and manner are appropriate. Grooming and personal hygiene are appropriate.  MEDICAL DECISION MAKING: Patient here with possible viral syndrome, possible influenza. Does have a cough with yellow sputum production and is febrile. Will obtain chest x-ray and urine to evaluate for possible bacterial causes for  his fever. He is nontoxic appearing. We'll give Tylenol, Toradol and IV fluids for symptom relief. Basic labs pending. No signs or symptoms to suggest meningitis.  ED PROGRESS: 1:06 PM  Pt's labs are unremarkable. Vital signs are improving. He reports he is feeling much better after Tylenol, Toradol IV fluids. Chest x-ray shows viral infection but no infiltrate.  Urine shows no sign of infection other than trace leukocytes. Suspect viral syndrome, possible influenza. Have  offered Tamiflu the patient declines. Have discussed with him supportive care instructions including alternating Tylenol and Motrin, drinking plain fluids. Discussed with patient that symptoms may last 7-10 days. Have discussed return precautions. He verbalizes understanding and is comfortable with this plan.     [place], DO 10/03/14 1313

## 2014-11-17 ENCOUNTER — Ambulatory Visit: Payer: Commercial Managed Care - HMO | Attending: Internal Medicine | Admitting: Internal Medicine

## 2014-11-17 ENCOUNTER — Encounter: Payer: Self-pay | Admitting: Internal Medicine

## 2014-11-17 VITALS — BP 140/80 | HR 89 | Temp 98.0°F | Resp 16 | Wt 218.2 lb

## 2014-11-17 DIAGNOSIS — M179 Osteoarthritis of knee, unspecified: Secondary | ICD-10-CM | POA: Insufficient documentation

## 2014-11-17 DIAGNOSIS — I10 Essential (primary) hypertension: Secondary | ICD-10-CM | POA: Insufficient documentation

## 2014-11-17 DIAGNOSIS — M25561 Pain in right knee: Secondary | ICD-10-CM

## 2014-11-17 DIAGNOSIS — Z23 Encounter for immunization: Secondary | ICD-10-CM | POA: Diagnosis not present

## 2014-11-17 LAB — COMPLETE METABOLIC PANEL WITH GFR
ALBUMIN: 4.4 g/dL (ref 3.5–5.2)
ALK PHOS: 55 U/L (ref 39–117)
ALT: 31 U/L (ref 0–53)
AST: 27 U/L (ref 0–37)
BUN: 12 mg/dL (ref 6–23)
CO2: 24 mEq/L (ref 19–32)
Calcium: 9.7 mg/dL (ref 8.4–10.5)
Chloride: 104 mEq/L (ref 96–112)
Creat: 0.96 mg/dL (ref 0.50–1.35)
GFR, EST NON AFRICAN AMERICAN: 87 mL/min
Glucose, Bld: 78 mg/dL (ref 70–99)
Potassium: 3.8 mEq/L (ref 3.5–5.3)
Sodium: 138 mEq/L (ref 135–145)
Total Bilirubin: 1.3 mg/dL — ABNORMAL HIGH (ref 0.2–1.2)
Total Protein: 7.8 g/dL (ref 6.0–8.3)

## 2014-11-17 MED ORDER — AMLODIPINE BESYLATE 5 MG PO TABS: 5.0000 mg | ORAL_TABLET | Freq: Every day | ORAL | Status: DC

## 2014-11-17 NOTE — Patient Instructions (Signed)
DASH Eating Plan °DASH stands for "Dietary Approaches to Stop Hypertension." The DASH eating plan is a healthy eating plan that has been shown to reduce high blood pressure (hypertension). Additional health benefits may include reducing the risk of type 2 diabetes mellitus, heart disease, and stroke. The DASH eating plan may also help with weight loss. °WHAT DO I NEED TO KNOW ABOUT THE DASH EATING PLAN? °For the DASH eating plan, you will follow these general guidelines: °· Choose foods with a percent daily value for sodium of less than 5% (as listed on the food label). °· Use salt-free seasonings or herbs instead of table salt or sea salt. °· Check with your health care provider or pharmacist before using salt substitutes. °· Eat lower-sodium products, often labeled as "lower sodium" or "no salt added." °· Eat fresh foods. °· Eat more vegetables, fruits, and low-fat dairy products. °· Choose whole grains. Look for the word "whole" as the first word in the ingredient list. °· Choose fish and skinless chicken or turkey more often than red meat. Limit fish, poultry, and meat to 6 oz (170 g) each day. °· Limit sweets, desserts, sugars, and sugary drinks. °· Choose heart-healthy fats. °· Limit cheese to 1 oz (28 g) per day. °· Eat more home-cooked food and less restaurant, buffet, and fast food. °· Limit fried foods. °· Cook foods using methods other than frying. °· Limit canned vegetables. If you do use them, rinse them well to decrease the sodium. °· When eating at a restaurant, ask that your food be prepared with less salt, or no salt if possible. °WHAT FOODS CAN I EAT? °Seek help from a dietitian for individual calorie needs. °Grains °Whole grain or whole wheat bread. Brown rice. Whole grain or whole wheat pasta. Quinoa, bulgur, and whole grain cereals. Low-sodium cereals. Corn or whole wheat flour tortillas. Whole grain cornbread. Whole grain crackers. Low-sodium crackers. °Vegetables °Fresh or frozen vegetables  (raw, steamed, roasted, or grilled). Low-sodium or reduced-sodium tomato and vegetable juices. Low-sodium or reduced-sodium tomato sauce and paste. Low-sodium or reduced-sodium canned vegetables.  °Fruits °All fresh, canned (in natural juice), or frozen fruits. °Meat and Other Protein Products °Ground beef (85% or leaner), grass-fed beef, or beef trimmed of fat. Skinless chicken or turkey. Ground chicken or turkey. Pork trimmed of fat. All fish and seafood. Eggs. Dried beans, peas, or lentils. Unsalted nuts and seeds. Unsalted canned beans. °Dairy °Low-fat dairy products, such as skim or 1% milk, 2% or reduced-fat cheeses, low-fat ricotta or cottage cheese, or plain low-fat yogurt. Low-sodium or reduced-sodium cheeses. °Fats and Oils °Tub margarines without trans fats. Light or reduced-fat mayonnaise and salad dressings (reduced sodium). Avocado. Safflower, olive, or canola oils. Natural peanut or almond butter. °Other °Unsalted popcorn and pretzels. °The items listed above may not be a complete list of recommended foods or beverages. Contact your dietitian for more options. °WHAT FOODS ARE NOT RECOMMENDED? °Grains °White bread. White pasta. White rice. Refined cornbread. Bagels and croissants. Crackers that contain trans fat. °Vegetables °Creamed or fried vegetables. Vegetables in a cheese sauce. Regular canned vegetables. Regular canned tomato sauce and paste. Regular tomato and vegetable juices. °Fruits °Dried fruits. Canned fruit in light or heavy syrup. Fruit juice. °Meat and Other Protein Products °Fatty cuts of meat. Ribs, chicken wings, bacon, sausage, bologna, salami, chitterlings, fatback, hot dogs, bratwurst, and packaged luncheon meats. Salted nuts and seeds. Canned beans with salt. °Dairy °Whole or 2% milk, cream, half-and-half, and cream cheese. Whole-fat or sweetened yogurt. Full-fat   cheeses or blue cheese. Nondairy creamers and whipped toppings. Processed cheese, cheese spreads, or cheese  curds. °Condiments °Onion and garlic salt, seasoned salt, table salt, and sea salt. Canned and packaged gravies. Worcestershire sauce. Tartar sauce. Barbecue sauce. Teriyaki sauce. Soy sauce, including reduced sodium. Steak sauce. Fish sauce. Oyster sauce. Cocktail sauce. Horseradish. Ketchup and mustard. Meat flavorings and tenderizers. Bouillon cubes. Hot sauce. Tabasco sauce. Marinades. Taco seasonings. Relishes. °Fats and Oils °Butter, stick margarine, lard, shortening, ghee, and bacon fat. Coconut, palm kernel, or palm oils. Regular salad dressings. °Other °Pickles and olives. Salted popcorn and pretzels. °The items listed above may not be a complete list of foods and beverages to avoid. Contact your dietitian for more information. °WHERE CAN I FIND MORE INFORMATION? °National Heart, [employer], and [employer] Institute: www.nhlbi.nih.gov/health/health-topics/topics/dash/ °Document Released: 10/05/2011 Document Revised: 03/02/2014 Document Reviewed: 08/20/2013 °ExitCare® Patient Information ©2015 [employer]. This information is not intended to replace advice given to you by your health care provider. Make sure you discuss any questions you have with your health care provider. ° °

## 2014-11-17 NOTE — Progress Notes (Signed)
Patient states he is here for his routine check up Just refilled his medication but will need a new script for future refills

## 2014-11-17 NOTE — Progress Notes (Signed)
MRN: [id number] Name: Dustin [name]  Sex: male Age: 59 y.o. DOB: [birth year]  Allergies: Review of patient's allergies indicates no known allergies.  Chief Complaint  Patient presents with  . Follow-up    HPI: Patient is 59 y.o. male who has history of hypertension, osteoarthritis of knees comes today for followup , as per patient he just took his blood pressure medication before he can his mental blood pressure is 140/80, denies any headache dizziness chest and shortness of breath, has been following up with orthopedics for the knee pain. Taking pain medications when necessary.   Past Medical History  Diagnosis Date  . History of blood clots     to left arm  . Arthritis     bil knees, left hand  . DVT of upper extremity (deep vein thrombosis)     left brachial vein, 12/2010    Past Surgical History  Procedure Laterality Date  . Left  hand   2011    cyst removed.  . 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 Colonscopy       1 year ago       Medication List       This list is accurate as of: 11/17/14 12:11 PM.  Always use your most recent med list.               amLODipine 5 MG tablet  Commonly known as:  NORVASC  Take 1 tablet (5 mg total) by mouth daily.     etodolac 400 MG tablet  Commonly known as:  LODINE  Take 400 mg by mouth 2 (two) times daily.     HYDROcodone-acetaminophen 10-325 MG per tablet  Commonly known as:  NORCO  Take 1 tablet by mouth 2 (two) times daily.        Meds ordered this encounter  Medications  . amLODipine (NORVASC) 5 MG tablet    Sig: Take 1 tablet (5 mg total) by mouth daily.    Dispense:  90 tablet    Refill:  1     There is no immunization history on file for this patient.  Family History  Problem Relation Age of Onset  . Diabetes Other   . Hypertension Other   . Anesthesia problems Neg  Hx   . Hypotension Neg Hx   . Malignant hyperthermia Neg Hx   . Pseudochol deficiency Neg Hx   . Cancer Mother   . Cancer Father     prostate cancer  . Diabetes Maternal Uncle     History  Substance Use Topics  . Smoking status: Former Research scientist (life sciences)  . Smokeless tobacco: Not on file  . Alcohol Use: No    Review of Systems   As noted in HPI  Filed Vitals:   11/17/14 1205  BP: 140/80  Pulse:   Temp:   Resp:     Physical Exam  Physical Exam  Constitutional: No distress.  Eyes: EOM are normal. Pupils are equal, round, and reactive to light.  Cardiovascular: Normal rate and regular rhythm.   Pulmonary/Chest: Breath sounds normal. No respiratory distress. He has no wheezes. He has no rales.  Musculoskeletal: He exhibits no edema.    CBC    Component Value Date/Time   WBC 5.7 10/03/2014 1126   RBC 4.58 10/03/2014 1126   HGB 13.7 10/03/2014 1126   HCT 40.4  10/03/2014 1126   PLT 203 10/03/2014 1126   MCV 88.2 10/03/2014 1126   LYMPHSABS 1.2 10/03/2014 1126   MONOABS 0.6 10/03/2014 1126   EOSABS 0.0 10/03/2014 1126   BASOSABS 0.0 10/03/2014 1126    CMP     Component Value Date/Time   NA 134* 10/03/2014 1126   K 4.3 10/03/2014 1126   CL 96 10/03/2014 1126   CO2 23 10/03/2014 1126   GLUCOSE 114* 10/03/2014 1126   BUN 15 10/03/2014 1126   CREATININE 1.02 10/03/2014 1126   CREATININE 1.01 04/01/2014 1226   CALCIUM 9.3 10/03/2014 1126   PROT 7.2 04/01/2014 1226   ALBUMIN 4.4 04/01/2014 1226   AST 43* 04/01/2014 1226   ALT 43 04/01/2014 1226   ALKPHOS 45 04/01/2014 1226   BILITOT 1.8* 04/01/2014 1226   GFRNONAA 79* 10/03/2014 1126   GFRNONAA 82 04/01/2014 1226   GFRAA >90 10/03/2014 1126   GFRAA >89 04/01/2014 1226    Lab Results  Component Value Date/Time   CHOL 183 04/01/2014 12:26 PM    No components found for: HGA1C  Lab Results  Component Value Date/Time   AST 43* 04/01/2014 12:26 PM    Assessment and Plan  Essential hypertension, benign -  Plan: again counseled patient for DASH diet, continue with amLODipine (NORVASC) 5 MG tablet, will repeat blood chemistry.  Knee pain, right/Osteoarthritis Currently on pain medications following up with orthopedics.  Needs flu shot Flu shot given today  Health Maintenance -Colonoscopy: as per patient had colonoscopy done obtain the records   -Vaccinations:   Flu shot today   Return in about 3 months (around 02/16/2015) for hypertension.  Lorayne Marek, MD

## 2014-12-18 ENCOUNTER — Ambulatory Visit: Payer: Commercial Managed Care - HMO | Attending: Internal Medicine | Admitting: Internal Medicine

## 2014-12-18 ENCOUNTER — Encounter: Payer: Self-pay | Admitting: Internal Medicine

## 2014-12-18 VITALS — BP 158/84 | HR 81 | Temp 98.0°F | Resp 16 | Wt 216.2 lb

## 2014-12-18 DIAGNOSIS — R05 Cough: Secondary | ICD-10-CM | POA: Diagnosis not present

## 2014-12-18 DIAGNOSIS — M25561 Pain in right knee: Secondary | ICD-10-CM | POA: Insufficient documentation

## 2014-12-18 DIAGNOSIS — J069 Acute upper respiratory infection, unspecified: Secondary | ICD-10-CM | POA: Insufficient documentation

## 2014-12-18 DIAGNOSIS — I10 Essential (primary) hypertension: Secondary | ICD-10-CM | POA: Insufficient documentation

## 2014-12-18 DIAGNOSIS — R059 Cough, unspecified: Secondary | ICD-10-CM

## 2014-12-18 MED ORDER — DOXYCYCLINE HYCLATE 100 MG PO TABS: 100.0000 mg | ORAL_TABLET | Freq: Two times a day (BID) | ORAL | Status: DC

## 2014-12-18 NOTE — Progress Notes (Signed)
MRN: [id number] Name: Dustin [name]  Sex: male Age: 59 y.o. DOB: [birth year]  Allergies: Review of patient's allergies indicates no known allergies.  Chief Complaint  Patient presents with  . Follow-up    HPI: Patient is 59 y.o. male who has history of hypertension, osteoarthritis, as per patient he had a left knee surgery done in the past and was following up with orthopedics, currently has been having pain and is requesting another referral to see orthopedic Dr., denies any recent fall or trauma, today's blood pressure is elevated denies any headache dizziness chest and shortness of breath. He does complain of cold symptoms for the last one week.  Past Medical History  Diagnosis Date  . History of blood clots     to left arm  . Arthritis     bil knees, left hand  . DVT of upper extremity (deep vein thrombosis)     left brachial vein, 12/2010    Past Surgical History  Procedure Laterality Date  . Left  hand   2011    cyst removed.  . 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 Colonscopy       1 year ago       Medication List       This list is accurate as of: 12/18/14 10:24 AM.  Always use your most recent med list.               amLODipine 5 MG tablet  Commonly known as:  NORVASC  Take 1 tablet (5 mg total) by mouth daily.     doxycycline 100 MG tablet  Commonly known as:  VIBRA-TABS  Take 1 tablet (100 mg total) by mouth 2 (two) times daily.     etodolac 400 MG tablet  Commonly known as:  LODINE  Take 400 mg by mouth 2 (two) times daily.     HYDROcodone-acetaminophen 10-325 MG per tablet  Commonly known as:  NORCO  Take 1 tablet by mouth 2 (two) times daily.        Meds ordered this encounter  Medications  . doxycycline (VIBRA-TABS) 100 MG tablet    Sig: Take 1 tablet (100 mg total) by mouth 2 (two) times daily.   Dispense:  20 tablet    Refill:  0    Immunization History  Administered Date(s) Administered  . Influenza,inj,Quad PF,36+ Mos 11/17/2014    Family History  Problem Relation Age of Onset  . Diabetes Other   . Hypertension Other   . Anesthesia problems Neg Hx   . Hypotension Neg Hx   . Malignant hyperthermia Neg Hx   . Pseudochol deficiency Neg Hx   . Cancer Mother   . Cancer Father     prostate cancer  . Diabetes Maternal Uncle     History  Substance Use Topics  . Smoking status: Former Research scientist (life sciences)  . Smokeless tobacco: Not on file  . Alcohol Use: No    Review of Systems   As noted in HPI  Filed Vitals:   12/18/14 0938  BP: 158/84  Pulse: 81  Temp: 98 F (36.7 C)  Resp: 16    Physical Exam  Physical Exam  Constitutional: No distress.  Eyes: EOM are normal. Pupils are equal, round, and reactive to light.  Cardiovascular: Normal rate and regular rhythm.   Pulmonary/Chest: Breath sounds normal. No respiratory  distress. He has no wheezes. He has no rales.    CBC    Component Value Date/Time   WBC 5.7 10/03/2014 1126   RBC 4.58 10/03/2014 1126   HGB 13.7 10/03/2014 1126   HCT 40.4 10/03/2014 1126   PLT 203 10/03/2014 1126   MCV 88.2 10/03/2014 1126   LYMPHSABS 1.2 10/03/2014 1126   MONOABS 0.6 10/03/2014 1126   EOSABS 0.0 10/03/2014 1126   BASOSABS 0.0 10/03/2014 1126    CMP     Component Value Date/Time   NA 138 11/17/2014 1218   K 3.8 11/17/2014 1218   CL 104 11/17/2014 1218   CO2 24 11/17/2014 1218   GLUCOSE 78 11/17/2014 1218   BUN 12 11/17/2014 1218   CREATININE 0.96 11/17/2014 1218   CREATININE 1.02 10/03/2014 1126   CALCIUM 9.7 11/17/2014 1218   PROT 7.8 11/17/2014 1218   ALBUMIN 4.4 11/17/2014 1218   AST 27 11/17/2014 1218   ALT 31 11/17/2014 1218   ALKPHOS 55 11/17/2014 1218   BILITOT 1.3* 11/17/2014 1218   GFRNONAA 87 11/17/2014 1218   GFRNONAA 79* 10/03/2014 1126   GFRAA >89 11/17/2014 1218   GFRAA >90 10/03/2014 1126    Lab  Results  Component Value Date/Time   CHOL 183 04/01/2014 12:26 PM    No components found for: HGA1C  Lab Results  Component Value Date/Time   AST 27 11/17/2014 12:18 PM    Assessment and Plan  URI (upper respiratory infection) - Plan: doxycycline (VIBRA-TABS) 100 MG tablet  Knee pain, right - Plan: Ambulatory referral to Orthopedic Surgery, continue the pain medication.  Essential hypertension, benign Blood pressure is borderline elevated, I have advised patient for DASH diet continue with amlodipine, reevaluate on the next visit.  Cough Patient will take OTC Robitussin    Return in about 3 months (around 03/18/2015) for hypertension.   This note has been created with Surveyor, quantity. Any transcriptional errors are unintentional.    Lorayne Marek, MD

## 2014-12-18 NOTE — Progress Notes (Signed)
Patient here for follow up on his arthritis to his knees Requesting referral to orthopedics-(Dr Eulas Post)

## 2014-12-21 ENCOUNTER — Telehealth: Payer: Self-pay | Admitting: Internal Medicine

## 2014-12-21 NOTE — Telephone Encounter (Signed)
Patient has come in today to inquire about the location of his referral; Patient states that he does not want to got to the [facility]; Patient wants to see a Dr. Marily Memos @ [facility], who can be contacted at  [phone]

## 2014-12-30 ENCOUNTER — Ambulatory Visit
Admission: RE | Admit: 2014-12-30 | Discharge: 2014-12-30 | Disposition: A | Discharge status: Home / Self Care | Payer: Commercial Managed Care - HMO | Source: Ambulatory Visit | Attending: Orthopedic Surgery | Admitting: Orthopedic Surgery

## 2014-12-30 ENCOUNTER — Other Ambulatory Visit: Payer: Self-pay | Admitting: Orthopedic Surgery

## 2014-12-30 DIAGNOSIS — M1711 Unilateral primary osteoarthritis, right knee: Secondary | ICD-10-CM

## 2015-02-10 ENCOUNTER — Encounter: Payer: Self-pay | Admitting: Internal Medicine

## 2015-02-10 ENCOUNTER — Ambulatory Visit: Payer: Commercial Managed Care - HMO | Attending: Internal Medicine | Admitting: Internal Medicine

## 2015-02-10 VITALS — BP 135/86 | HR 91 | Temp 98.0°F | Resp 16 | Wt 213.4 lb

## 2015-02-10 DIAGNOSIS — Z86718 Personal history of other venous thrombosis and embolism: Secondary | ICD-10-CM | POA: Diagnosis not present

## 2015-02-10 DIAGNOSIS — M179 Osteoarthritis of knee, unspecified: Secondary | ICD-10-CM | POA: Diagnosis present

## 2015-02-10 DIAGNOSIS — Z79899 Other long term (current) drug therapy: Secondary | ICD-10-CM | POA: Insufficient documentation

## 2015-02-10 DIAGNOSIS — M1711 Unilateral primary osteoarthritis, right knee: Secondary | ICD-10-CM

## 2015-02-10 DIAGNOSIS — I10 Essential (primary) hypertension: Secondary | ICD-10-CM | POA: Insufficient documentation

## 2015-02-10 DIAGNOSIS — Z87891 Personal history of nicotine dependence: Secondary | ICD-10-CM | POA: Insufficient documentation

## 2015-02-10 NOTE — Progress Notes (Signed)
Patient states he is having his right knee replaced and  Requesting a referral to a orthopedic surgeon Has an appointment scheduled for May 8

## 2015-02-10 NOTE — Progress Notes (Signed)
MRN: [id number] Name: Dustin [name]  Sex: male Age: 59 y.o. DOB: Feb 28, 1956  Allergies: Review of patient's allergies indicates no known allergies.  Chief Complaint  Patient presents with  . Referral    HPI: Patient is 59 y.o. male who history of to hypertension, osteoarthritis of right knee as per patient he was seen by one of the orthopedic doctors and has been referred to to [place] today he is requesting a formal referral, he already has scheduled appointment next month,last month he had x-ray of right knee done which reported Severe degenerative changes right knee with complete loss of jointspace along the medial compartment.he is compliant in taking his blood pressure medication, previous blood work reviewed with the patient. No other complaints today.  Past Medical History  Diagnosis Date  . History of blood clots     to left arm  . Arthritis     bil knees, left hand  . DVT of upper extremity (deep vein thrombosis)     left brachial vein, 12/2010    Past Surgical History  Procedure Laterality Date  . Left  hand   2011    cyst removed.  . Total knee arthroplasty  10/19/2011    Procedure: TOTAL KNEE ARTHROPLASTY;  Surgeon: Sharmon Revere;  Location: [place];  Service: Orthopedics;  Laterality: Left;  . Colonscopy       1 year ago       Medication List       This list is accurate as of: 02/10/15 12:30 PM.  Always use your most recent med list.               amLODipine 5 MG tablet  Commonly known as:  NORVASC  Take 1 tablet (5 mg total) by mouth daily.     doxycycline 100 MG tablet  Commonly known as:  VIBRA-TABS  Take 1 tablet (100 mg total) by mouth 2 (two) times daily.     etodolac 400 MG tablet  Commonly known as:  LODINE  Take 400 mg by mouth 2 (two) times daily.     HYDROcodone-acetaminophen 10-325 MG per tablet  Commonly known as:  NORCO  Take 1 tablet by mouth 2 (two) times daily.        No orders of the defined types  were placed in this encounter.    Immunization History  Administered Date(s) Administered  . Influenza,inj,Quad PF,36+ Mos 11/17/2014    Family History  Problem Relation Age of Onset  . Diabetes Other   . Hypertension Other   . Anesthesia problems Neg Hx   . Hypotension Neg Hx   . Malignant hyperthermia Neg Hx   . Pseudochol deficiency Neg Hx   . Cancer Mother   . Cancer Father     prostate cancer  . Diabetes Maternal Uncle     History  Substance Use Topics  . Smoking status: Former Research scientist (life sciences)  . Smokeless tobacco: Not on file  . Alcohol Use: No    Review of Systems   As noted in HPI  Filed Vitals:   02/10/15 1155  BP: 135/86  Pulse: 91  Temp: 98 F (36.7 C)  Resp: 16    Physical Exam  Physical Exam  Constitutional: No distress.  Eyes: EOM are normal. Pupils are equal, round, and reactive to light.  Cardiovascular: Normal rate and regular rhythm.   Pulmonary/Chest: Breath sounds normal. No respiratory distress. He has no wheezes. He has no rales.  Musculoskeletal:  Right knee crepitation    CBC    Component Value Date/Time   WBC 5.7 10/03/2014 1126   RBC 4.58 10/03/2014 1126   HGB 13.7 10/03/2014 1126   HCT 40.4 10/03/2014 1126   PLT 203 10/03/2014 1126   MCV 88.2 10/03/2014 1126   LYMPHSABS 1.2 10/03/2014 1126   MONOABS 0.6 10/03/2014 1126   EOSABS 0.0 10/03/2014 1126   BASOSABS 0.0 10/03/2014 1126    CMP     Component Value Date/Time   NA 138 11/17/2014 1218   K 3.8 11/17/2014 1218   CL 104 11/17/2014 1218   CO2 24 11/17/2014 1218   GLUCOSE 78 11/17/2014 1218   BUN 12 11/17/2014 1218   CREATININE 0.96 11/17/2014 1218   CREATININE 1.02 10/03/2014 1126   CALCIUM 9.7 11/17/2014 1218   PROT 7.8 11/17/2014 1218   ALBUMIN 4.4 11/17/2014 1218   AST 27 11/17/2014 1218   ALT 31 11/17/2014 1218   ALKPHOS 55 11/17/2014 1218   BILITOT 1.3* 11/17/2014 1218   GFRNONAA 87 11/17/2014 1218   GFRNONAA 79* 10/03/2014 1126   GFRAA >89 11/17/2014 1218    GFRAA >90 10/03/2014 1126    Lab Results  Component Value Date/Time   CHOL 183 04/01/2014 12:26 PM    No results found for: HGBA1C  Lab Results  Component Value Date/Time   AST 27 11/17/2014 12:18 PM    Assessment and Plan  Osteoarthritis of right knee, unspecified osteoarthritis type - Plan:patient takes pain medication when necessary, Ambulatory referral to Orthopedic Surgery  Essential hypertension, benign Blood pressure is controlled, continue with DASH diet currently on amlodipine 5 mg daily.   Return in about 3 months (around 05/12/2015) for hypertension.   This note has been created with Surveyor, quantity. Any transcriptional errors are unintentional.    Lorayne Marek, MD

## 2015-02-26 ENCOUNTER — Telehealth: Payer: Self-pay | Admitting: General Practice

## 2015-02-26 NOTE — Telephone Encounter (Signed)
Kieth Brightly calling from [facility] office in regards to mutual patient. Kieth Brightly states that there was a referral placed by PCP for patient to be seen. Kieth Brightly states patient has [employer], and Mcarthur Rossetti has denied the referral. Kieth Brightly states Mcarthur Rossetti is now requiring an electronic referral to be placed. Informed Kieth Brightly that I would share this information with our referral specialist and she would follow up accordingly. Kieth Brightly wanted to share with you the patients diagnosis code: M17.9.. Kieth Brightly can be contacted at [phone]. Please assist

## 2015-02-26 NOTE — Telephone Encounter (Signed)
Pt saw Dr Annitta Needs and he did a referral requesting [facility]: Marybelle Killings [place] approve his visit and now he wants to see Dr Eulas Post come on . I called patient and not answer .

## 2015-03-09 DIAGNOSIS — M1712 Unilateral primary osteoarthritis, left knee: Secondary | ICD-10-CM | POA: Diagnosis not present

## 2015-03-09 DIAGNOSIS — M1711 Unilateral primary osteoarthritis, right knee: Secondary | ICD-10-CM | POA: Diagnosis not present

## 2015-03-09 DIAGNOSIS — M25561 Pain in right knee: Secondary | ICD-10-CM | POA: Diagnosis not present

## 2015-03-12 ENCOUNTER — Other Ambulatory Visit (HOSPITAL_COMMUNITY): Payer: Self-pay | Admitting: Orthopaedic Surgery

## 2015-03-18 ENCOUNTER — Encounter (HOSPITAL_COMMUNITY)
Admission: RE | Admit: 2015-03-18 | Discharge: 2015-03-18 | Disposition: A | Discharge status: Home / Self Care | Payer: Medicare Other | Source: Ambulatory Visit | Attending: Orthopaedic Surgery | Admitting: Orthopaedic Surgery

## 2015-03-18 ENCOUNTER — Encounter (HOSPITAL_COMMUNITY): Payer: Self-pay | Admitting: Emergency Medicine

## 2015-03-18 ENCOUNTER — Encounter (HOSPITAL_COMMUNITY): Payer: Self-pay

## 2015-03-18 DIAGNOSIS — Z01818 Encounter for other preprocedural examination: Secondary | ICD-10-CM | POA: Insufficient documentation

## 2015-03-18 DIAGNOSIS — Z86718 Personal history of other venous thrombosis and embolism: Secondary | ICD-10-CM | POA: Insufficient documentation

## 2015-03-18 DIAGNOSIS — R9431 Abnormal electrocardiogram [ECG] [EKG]: Secondary | ICD-10-CM | POA: Diagnosis not present

## 2015-03-18 DIAGNOSIS — Z01812 Encounter for preprocedural laboratory examination: Secondary | ICD-10-CM | POA: Insufficient documentation

## 2015-03-18 DIAGNOSIS — I10 Essential (primary) hypertension: Secondary | ICD-10-CM | POA: Diagnosis not present

## 2015-03-18 DIAGNOSIS — Z87891 Personal history of nicotine dependence: Secondary | ICD-10-CM | POA: Diagnosis not present

## 2015-03-18 HISTORY — DX: Essential (primary) hypertension: I10

## 2015-03-18 LAB — SURGICAL PCR SCREEN
MRSA, PCR: NEGATIVE
STAPHYLOCOCCUS AUREUS: NEGATIVE

## 2015-03-18 LAB — COMPREHENSIVE METABOLIC PANEL
ALK PHOS: 55 U/L (ref 38–126)
ALT: 32 U/L (ref 17–63)
ANION GAP: 7 (ref 5–15)
AST: 31 U/L (ref 15–41)
Albumin: 4.2 g/dL (ref 3.5–5.0)
BUN: 16 mg/dL (ref 6–20)
CO2: 25 mmol/L (ref 22–32)
Calcium: 9.8 mg/dL (ref 8.9–10.3)
Chloride: 105 mmol/L (ref 101–111)
Creatinine, Ser: 1.02 mg/dL (ref 0.61–1.24)
GFR calc Af Amer: >60 mL/min (ref >60)
GFR calc non Af Amer: >60 mL/min (ref >60)
Glucose, Bld: 104 mg/dL — ABNORMAL HIGH (ref 65–99)
Potassium: 3.9 mmol/L (ref 3.5–5.1)
Sodium: 137 mmol/L (ref 135–145)
Total Bilirubin: 1.2 mg/dL (ref 0.3–1.2)
Total Protein: 8.1 g/dL (ref 6.5–8.1)

## 2015-03-18 LAB — URINALYSIS, ROUTINE W REFLEX MICROSCOPIC
Glucose, UA: NEGATIVE mg/dL
Hgb urine dipstick: NEGATIVE
Ketones, ur: NEGATIVE mg/dL
Leukocytes, UA: NEGATIVE
Nitrite: NEGATIVE
Protein, ur: NEGATIVE mg/dL
Specific Gravity, Urine: 1.02 (ref 1.005–1.030)
Urobilinogen, UA: 0.2 mg/dL (ref 0.0–1.0)
pH: 5 (ref 5.0–8.0)

## 2015-03-18 LAB — CBC
HCT: 41.4 % (ref 39.0–52.0)
Hemoglobin: 13.8 g/dL (ref 13.0–17.0)
MCH: 29 pg (ref 26.0–34.0)
MCHC: 33.3 g/dL (ref 30.0–36.0)
MCV: 87 fL (ref 78.0–100.0)
Platelets: 221 K/uL (ref 150–400)
RBC: 4.76 MIL/uL (ref 4.22–5.81)
RDW: 12.9 % (ref 11.5–15.5)
WBC: 3.9 K/uL — ABNORMAL LOW (ref 4.0–10.5)

## 2015-03-18 LAB — PROTIME-INR
INR: 1.09 (ref 0.00–1.49)
Prothrombin Time: 14.3 seconds (ref 11.6–15.2)

## 2015-03-18 NOTE — Progress Notes (Signed)
PCP is Deepak Advani. Patient denied having any acute cardiac or pulmonary issues. Patients fiance Lydia at chair side during PAT visit.

## 2015-03-18 NOTE — Pre-Procedure Instructions (Signed)
Dustin [name]  03/18/2015   Your procedure is scheduled on:  Wednesday Mar 24, 2015 at 12:30 PM.  Report to [facility] Admitting at 10:30 AM.  Call this number if you have problems the morning of surgery: [phone]   Remember:   Do not eat food or drink liquids after midnight.   Take these medicines the morning of surgery with A SIP OF WATER: Amlodipine (Norvasc) and Hydrocodone if needed   Please stop taking any vitamins, herbal medications, Etodolac/Lodine, Ibuprofen, Motrin, Aleve, etc   Do not wear jewelry.  Do not wear lotions, powders, or cologne.   Men may shave face and neck.  Do not bring valuables to the hospital.  [facility] is not responsible for any belongings or valuables.               Contacts, dentures or bridgework may not be worn into surgery.  Leave suitcase in the car. After surgery it may be brought to your room.  For patients admitted to the hospital, discharge time is determined by your treatment team.               Patients discharged the day of surgery will not be allowed to drive home.  Name and phone number of your driver:   Special Instructions: Shower using CHG soap the night before and the morning of your surgery   Please read over the following fact sheets that you were given: Pain Booklet, Coughing and Deep Breathing, Total Joint Packet, MRSA Information and Surgical Site Infection Prevention

## 2015-03-18 NOTE — Progress Notes (Addendum)
Anesthesia Chart Review:  Pt is 59 year old male scheduled for computer assisted, cemented R total knee arthroplasty on 03/24/2015 with Dr. Lorin Mercy.   Please see Myra Gianotti, PA's Epic note dated 10/13/2011.   PMH includes: HTN, DVT (L brachial vein 12/2010). Former smoker. BMI 29. S/p L TKA 10/19/11.  Preoperative labs reviewed.    Chest x-ray 10/03/2014 reviewed. Prominent interstitial markings which are nonspecific but may be seen with viral/atypical URI.  EKG 03/18/2015: NSR. Left ventricular hypertrophy with repolarization abnormality. Cannot rule out Septal infarct, age undetermined.   [name], FNP-BC [facility]/Anesthesiology Phone: [phone] 03/18/2015 3:32 PM  Addendum: 03/18/15 EKG showed that ST depression in inferolateral leads is more pronounced when compared to previous tracings.  [name], FNP-BC discussed with anesthesiologist Dr. Oletta Lamas on 03/19/15 with recommendation to get additional input for his PCP at [facility]. This morning I received a phone call from nurse [facility] with Dr. Annitta Needs.  He reviewed pre-operative EKG and recommended pre-operative cardiology evaluation due to EKG changes. I have left a voice message with Malachy Mood at Dr. Lorin Mercy' office.  Langley Gauss said that cardiologist Dr. Verl Blalock comes to their clinic on Wednesdays, so Dr. Lorin Mercy' office could either set up a cardiology appointment themselves or contact their office at [phone] to schedule a visit there.   [name] [facility] Short Stay Center/Anesthesiology Phone [phone] 03/22/2015 11:56 AM

## 2015-03-19 ENCOUNTER — Telehealth: Payer: Self-pay | Admitting: Internal Medicine

## 2015-03-19 NOTE — Telephone Encounter (Signed)
EKG has changes compared to previous one, I would recommend cardiology evaluation prior to surgery.

## 2015-03-19 NOTE — Progress Notes (Signed)
EKG shows changes compared to previous one, I would recommend cardiology evaluation.

## 2015-03-19 NOTE — Telephone Encounter (Signed)
NP from [facility] called to request medical clearance for the patient, she stated that his EKG was abnormal and needs to know if it is okay for him to have knee surgery. If PCP feels that the  patient is cleared she would like a verbal order, Ph: [place] Please f/u

## 2015-03-22 ENCOUNTER — Telehealth: Payer: Self-pay

## 2015-03-22 NOTE — Telephone Encounter (Signed)
Returned call to [facility] and spoke with [facility] Patient will need to be evaluated by cardiologist prior to having His surgery They will let relay this message to the surgeon dr Lorin Mercy

## 2015-03-24 ENCOUNTER — Inpatient Hospital Stay (HOSPITAL_COMMUNITY): Admission: RE | Admit: 2015-03-24 | Payer: Medicare Other | Source: Ambulatory Visit | Admitting: Orthopaedic Surgery

## 2015-03-24 ENCOUNTER — Encounter (HOSPITAL_COMMUNITY): Admission: RE | Payer: Self-pay | Source: Ambulatory Visit

## 2015-03-24 ENCOUNTER — Telehealth: Payer: Self-pay | Admitting: Internal Medicine

## 2015-03-24 SURGERY — ARTHROPLASTY, KNEE, TOTAL, USING IMAGELESS COMPUTER-ASSISTED NAVIGATION
Anesthesia: Spinal | Laterality: Right

## 2015-03-24 NOTE — Telephone Encounter (Signed)
Patient came into office requesting medication refill on amLODipine (NORVASC) 5 MG tablet. Please f/u with patient

## 2015-03-25 ENCOUNTER — Ambulatory Visit: Payer: Medicare Other | Attending: Internal Medicine | Admitting: Internal Medicine

## 2015-03-25 ENCOUNTER — Encounter: Payer: Self-pay | Admitting: *Deleted

## 2015-03-25 ENCOUNTER — Encounter: Payer: Self-pay | Admitting: Internal Medicine

## 2015-03-25 ENCOUNTER — Other Ambulatory Visit: Payer: Self-pay

## 2015-03-25 VITALS — BP 130/70 | HR 78 | Temp 98.0°F | Resp 16 | Wt 214.0 lb

## 2015-03-25 DIAGNOSIS — Z87891 Personal history of nicotine dependence: Secondary | ICD-10-CM | POA: Insufficient documentation

## 2015-03-25 DIAGNOSIS — Z86718 Personal history of other venous thrombosis and embolism: Secondary | ICD-10-CM | POA: Diagnosis not present

## 2015-03-25 DIAGNOSIS — R9431 Abnormal electrocardiogram [ECG] [EKG]: Secondary | ICD-10-CM | POA: Diagnosis not present

## 2015-03-25 DIAGNOSIS — M1711 Unilateral primary osteoarthritis, right knee: Secondary | ICD-10-CM

## 2015-03-25 DIAGNOSIS — Z96652 Presence of left artificial knee joint: Secondary | ICD-10-CM | POA: Diagnosis not present

## 2015-03-25 DIAGNOSIS — Z792 Long term (current) use of antibiotics: Secondary | ICD-10-CM | POA: Diagnosis not present

## 2015-03-25 DIAGNOSIS — I10 Essential (primary) hypertension: Secondary | ICD-10-CM | POA: Diagnosis not present

## 2015-03-25 DIAGNOSIS — M179 Osteoarthritis of knee, unspecified: Secondary | ICD-10-CM

## 2015-03-25 DIAGNOSIS — Z7982 Long term (current) use of aspirin: Secondary | ICD-10-CM | POA: Insufficient documentation

## 2015-03-25 DIAGNOSIS — Z791 Long term (current) use of non-steroidal anti-inflammatories (NSAID): Secondary | ICD-10-CM | POA: Diagnosis not present

## 2015-03-25 MED ORDER — IBUPROFEN 600 MG PO TABS: 600.0000 mg | ORAL_TABLET | Freq: Four times a day (QID) | ORAL | Status: DC | PRN

## 2015-03-25 MED ORDER — AMLODIPINE BESYLATE 5 MG PO TABS: 5.0000 mg | ORAL_TABLET | Freq: Every day | ORAL | Status: DC

## 2015-03-25 NOTE — Patient Instructions (Signed)
DASH Eating Plan °DASH stands for "Dietary Approaches to Stop Hypertension." The DASH eating plan is a healthy eating plan that has been shown to reduce high blood pressure (hypertension). Additional health benefits may include reducing the risk of type 2 diabetes mellitus, heart disease, and stroke. The DASH eating plan may also help with weight loss. °WHAT DO I NEED TO KNOW ABOUT THE DASH EATING PLAN? °For the DASH eating plan, you will follow these general guidelines: °· Choose foods with a percent daily value for sodium of less than 5% (as listed on the food label). °· Use salt-free seasonings or herbs instead of table salt or sea salt. °· Check with your health care provider or pharmacist before using salt substitutes. °· Eat lower-sodium products, often labeled as "lower sodium" or "no salt added." °· Eat fresh foods. °· Eat more vegetables, fruits, and low-fat dairy products. °· Choose whole grains. Look for the word "whole" as the first word in the ingredient list. °· Choose fish and skinless chicken or turkey more often than red meat. Limit fish, poultry, and meat to 6 oz (170 g) each day. °· Limit sweets, desserts, sugars, and sugary drinks. °· Choose heart-healthy fats. °· Limit cheese to 1 oz (28 g) per day. °· Eat more home-cooked food and less restaurant, buffet, and fast food. °· Limit fried foods. °· Cook foods using methods other than frying. °· Limit canned vegetables. If you do use them, rinse them well to decrease the sodium. °· When eating at a restaurant, ask that your food be prepared with less salt, or no salt if possible. °WHAT FOODS CAN I EAT? °Seek help from a dietitian for individual calorie needs. °Grains °Whole grain or whole wheat bread. Brown rice. Whole grain or whole wheat pasta. Quinoa, bulgur, and whole grain cereals. Low-sodium cereals. Corn or whole wheat flour tortillas. Whole grain cornbread. Whole grain crackers. Low-sodium crackers. °Vegetables °Fresh or frozen vegetables  (raw, steamed, roasted, or grilled). Low-sodium or reduced-sodium tomato and vegetable juices. Low-sodium or reduced-sodium tomato sauce and paste. Low-sodium or reduced-sodium canned vegetables.  °Fruits °All fresh, canned (in natural juice), or frozen fruits. °Meat and Other Protein Products °Ground beef (85% or leaner), grass-fed beef, or beef trimmed of fat. Skinless chicken or turkey. Ground chicken or turkey. Pork trimmed of fat. All fish and seafood. Eggs. Dried beans, peas, or lentils. Unsalted nuts and seeds. Unsalted canned beans. °Dairy °Low-fat dairy products, such as skim or 1% milk, 2% or reduced-fat cheeses, low-fat ricotta or cottage cheese, or plain low-fat yogurt. Low-sodium or reduced-sodium cheeses. °Fats and Oils °Tub margarines without trans fats. Light or reduced-fat mayonnaise and salad dressings (reduced sodium). Avocado. Safflower, olive, or canola oils. Natural peanut or almond butter. °Other °Unsalted popcorn and pretzels. °The items listed above may not be a complete list of recommended foods or beverages. Contact your dietitian for more options. °WHAT FOODS ARE NOT RECOMMENDED? °Grains °White bread. White pasta. White rice. Refined cornbread. Bagels and croissants. Crackers that contain trans fat. °Vegetables °Creamed or fried vegetables. Vegetables in a cheese sauce. Regular canned vegetables. Regular canned tomato sauce and paste. Regular tomato and vegetable juices. °Fruits °Dried fruits. Canned fruit in light or heavy syrup. Fruit juice. °Meat and Other Protein Products °Fatty cuts of meat. Ribs, chicken wings, bacon, sausage, bologna, salami, chitterlings, fatback, hot dogs, bratwurst, and packaged luncheon meats. Salted nuts and seeds. Canned beans with salt. °Dairy °Whole or 2% milk, cream, half-and-half, and cream cheese. Whole-fat or sweetened yogurt. Full-fat   cheeses or blue cheese. Nondairy creamers and whipped toppings. Processed cheese, cheese spreads, or cheese  curds. °Condiments °Onion and garlic salt, seasoned salt, table salt, and sea salt. Canned and packaged gravies. Worcestershire sauce. Tartar sauce. Barbecue sauce. Teriyaki sauce. Soy sauce, including reduced sodium. Steak sauce. Fish sauce. Oyster sauce. Cocktail sauce. Horseradish. Ketchup and mustard. Meat flavorings and tenderizers. Bouillon cubes. Hot sauce. Tabasco sauce. Marinades. Taco seasonings. Relishes. °Fats and Oils °Butter, stick margarine, lard, shortening, ghee, and bacon fat. Coconut, palm kernel, or palm oils. Regular salad dressings. °Other °Pickles and olives. Salted popcorn and pretzels. °The items listed above may not be a complete list of foods and beverages to avoid. Contact your dietitian for more information. °WHERE CAN I FIND MORE INFORMATION? °National Heart, [employer], and [employer] Institute: www.nhlbi.nih.gov/health/health-topics/topics/dash/ °Document Released: 10/05/2011 Document Revised: 03/02/2014 Document Reviewed: 08/20/2013 °ExitCare® Patient Information ©2015 [employer]. This information is not intended to replace advice given to you by your health care provider. Make sure you discuss any questions you have with your health care provider. ° °

## 2015-03-25 NOTE — Progress Notes (Signed)
Patient ID: Dustin [name], male   DOB: 1956-06-25, 59 y.o.   MRN: [id number]   Rex Kras from [facility] precertification center called to get prior authorization for patient's scheduled echocardiogram 03/26/15.  According to Epic, patient has [employer] Medicare that expired 02/27/15.  Left message for Dustin [name].

## 2015-03-25 NOTE — Progress Notes (Signed)
Patient states he is supposed to have his knee replaced When he saw his orthopedic he was told he has an irregular heart beat  Their office did an ekg and patient was told it was abnormal And to follow up with his primary doctor i explained to patient he will have to make an appointment with the cardiologist here To obtain his clearance

## 2015-03-25 NOTE — Progress Notes (Signed)
MRN: [id number] Name: Dustin [name]  Sex: male Age: 59 y.o. DOB: [birth year]  Allergies: Review of patient's allergies indicates no known allergies.  Chief Complaint  Patient presents with  . Follow-up    HPI: Patient is 59 y.o. male who has to of hypertension, arthritis of right knee, as per patient is following up with orthopedics and was going to have surgery done had a baseline blood work as well as EKG which was reported to be abnormal, patient does not have any other risk factors except for hypertension, he denies any previous history of MI currently denies any chest pain or shortness of breath at rest, patient does not walk that much secondary to the knee pain ,  today his blood pressure initially was elevated but repeat manual blood pressure is 130/70, he denies any family history of heart disease.patient is on aspirin, he does not smoke cigarettes.Patient denies any orthopnea or PND.  Past Medical History  Diagnosis Date  . History of blood clots     to left arm  . Arthritis     bil knees, left hand  . DVT of upper extremity (deep vein thrombosis)     left brachial vein, 12/2010  . Hypertension     Past Surgical History  Procedure Laterality Date  . Left  hand   2011    cyst removed.  . Total knee arthroplasty  10/19/2011    Procedure: TOTAL KNEE ARTHROPLASTY;  Surgeon: Sharmon Revere;  Location: [place];  Service: Orthopedics;  Laterality: Left;  . Colonscopy       1 year ago       Medication List       This list is accurate as of: 03/25/15  3:16 PM.  Always use your most recent med list.               amLODipine 5 MG tablet  Commonly known as:  NORVASC  Take 1 tablet (5 mg total) by mouth daily.     aspirin 81 MG tablet  Take 81 mg by mouth daily.     doxycycline 100 MG tablet  Commonly known as:  VIBRA-TABS  Take 1 tablet (100 mg total) by mouth 2 (two) times daily.     etodolac 400 MG tablet  Commonly known as:  LODINE  Take 400 mg by mouth 2  (two) times daily.     HYDROcodone-acetaminophen 10-325 MG per tablet  Commonly known as:  NORCO  Take 1 tablet by mouth every 6 (six) hours as needed for moderate pain.        Meds ordered this encounter  Medications  . amLODipine (NORVASC) 5 MG tablet    Sig: Take 1 tablet (5 mg total) by mouth daily.    Dispense:  90 tablet    Refill:  1    Immunization History  Administered Date(s) Administered  . Influenza,inj,Quad PF,36+ Mos 11/17/2014    Family History  Problem Relation Age of Onset  . Diabetes Other   . Hypertension Other   . Anesthesia problems Neg Hx   . Hypotension Neg Hx   . Malignant hyperthermia Neg Hx   . Pseudochol deficiency Neg Hx   . Cancer Mother   . Cancer Father     prostate cancer  . Diabetes Maternal Uncle     History  Substance Use Topics  . Smoking status: Former Research scientist (life sciences)  . Smokeless tobacco: Not on file  . Alcohol Use: No    Review  of Systems   As noted in HPI  Filed Vitals:   03/25/15 1432  BP: 130/70  Pulse:   Temp:   Resp:     Physical Exam  Physical Exam  Constitutional: No distress.  Eyes: EOM are normal. Pupils are equal, round, and reactive to light.  Cardiovascular: Normal rate and regular rhythm.   Pulmonary/Chest: Breath sounds normal. No respiratory distress. He has no wheezes. He has no rales.  Musculoskeletal: He exhibits no edema.    CBC    Component Value Date/Time   WBC 3.9* 03/18/2015 1033   RBC 4.76 03/18/2015 1033   HGB 13.8 03/18/2015 1033   HCT 41.4 03/18/2015 1033   PLT 221 03/18/2015 1033   MCV 87.0 03/18/2015 1033   LYMPHSABS 1.2 10/03/2014 1126   MONOABS 0.6 10/03/2014 1126   EOSABS 0.0 10/03/2014 1126   BASOSABS 0.0 10/03/2014 1126    CMP     Component Value Date/Time   NA 137 03/18/2015 1033   K 3.9 03/18/2015 1033   CL 105 03/18/2015 1033   CO2 25 03/18/2015 1033   GLUCOSE 104* 03/18/2015 1033   BUN 16 03/18/2015 1033   CREATININE 1.02 03/18/2015 1033   CREATININE 0.96  11/17/2014 1218   CALCIUM 9.8 03/18/2015 1033   PROT 8.1 03/18/2015 1033   ALBUMIN 4.2 03/18/2015 1033   AST 31 03/18/2015 1033   ALT 32 03/18/2015 1033   ALKPHOS 55 03/18/2015 1033   BILITOT 1.2 03/18/2015 1033   GFRNONAA >60 03/18/2015 1033   GFRNONAA 87 11/17/2014 1218   GFRAA >60 03/18/2015 1033   GFRAA >89 11/17/2014 1218    Lab Results  Component Value Date/Time   CHOL 183 04/01/2014 12:26 PM    No results found for: HGBA1C  Lab Results  Component Value Date/Time   AST 31 03/18/2015 10:33 AM    Assessment and Plan  Essential hypertension, benign - Plan: Advised patient for DASH diet and continue with amLODipine (NORVASC) 5 MG tablet  Osteoarthritis of right knee, unspecified osteoarthritis type Following up with orthopedics  Nonspecific abnormal electrocardiogram (ECG) (EKG) - Plan:his EKG shows LVH with ST depression in inferior and lateral leads which are worse compared to previous EKG, will obtain baseline Echocardiogram, patient will schedule appointment with cardiology for evaluation and clearance.   Return in about 3 months (around 06/25/2015), or if symptoms worsen or fail to improve.   This note has been created with Surveyor, quantity. Any transcriptional errors are unintentional.    Lorayne Marek, MD

## 2015-03-26 ENCOUNTER — Ambulatory Visit (HOSPITAL_COMMUNITY)
Admission: RE | Admit: 2015-03-26 | Discharge: 2015-03-26 | Disposition: A | Discharge status: Home / Self Care | Payer: Medicare Other | Source: Ambulatory Visit | Attending: Internal Medicine | Admitting: Internal Medicine

## 2015-03-26 DIAGNOSIS — I517 Cardiomegaly: Secondary | ICD-10-CM | POA: Diagnosis not present

## 2015-03-26 DIAGNOSIS — R011 Cardiac murmur, unspecified: Secondary | ICD-10-CM | POA: Diagnosis not present

## 2015-03-26 DIAGNOSIS — R9431 Abnormal electrocardiogram [ECG] [EKG]: Secondary | ICD-10-CM | POA: Diagnosis not present

## 2015-03-26 NOTE — Progress Notes (Signed)
Echocardiogram 2D Echocardiogram has been performed.  Tresa Res 03/26/2015, 2:56 PM

## 2015-04-07 ENCOUNTER — Encounter: Payer: Self-pay | Admitting: Cardiology

## 2015-04-07 ENCOUNTER — Ambulatory Visit: Payer: Medicare Other | Attending: Cardiology | Admitting: Cardiology

## 2015-04-07 VITALS — BP 148/88 | HR 71 | Temp 97.7°F | Resp 18 | Ht 72.0 in | Wt 218.8 lb

## 2015-04-07 DIAGNOSIS — I517 Cardiomegaly: Secondary | ICD-10-CM | POA: Diagnosis not present

## 2015-04-07 NOTE — Patient Instructions (Signed)
You are cleared for your knee surgery today and please return to see me if you have any additional heart concerns.

## 2015-04-07 NOTE — Progress Notes (Signed)
Patient here for cardiac clearance for surgery.  Constant pain in right knee as 8/10 described as discomfort.  Patient has no complains of chest pain/tightness/discomfort at this time.  Patient states that overall he feels "okay."  Patient states he took his medications today.  Current blood pressure 148/88, heart rate 71.

## 2015-04-07 NOTE — Progress Notes (Signed)
HPI Dustin [name] is a pleasant 59 year old African-American male who comes today for cardiac clearance for right knee replacement. It will be performed by Dr. Lorin Mercy.   He has a long history of hypertension that has been poorly controlled. Some of this is been noncompliance.  His EKG shows normal sinus rhythm with LVH with strain. Echocardiogram confirms severe concentric left ventricular hypertrophy with ejection fraction of 70%. He has mild mitral regurgitation but no valvular abnormality.  He denies any chest pain or angina. He denies orthopnea, PND, palpitations, presyncope or syncope, or peripheral edema.  Past Medical History  Diagnosis Date  . History of blood clots     to left arm  . Arthritis     bil knees, left hand  . DVT of upper extremity (deep vein thrombosis)     left brachial vein, 12/2010  . Hypertension     Current Outpatient Prescriptions  Medication Sig Dispense Refill  . amLODipine (NORVASC) 5 MG tablet Take 1 tablet (5 mg total) by mouth daily. 90 tablet 1  . aspirin 81 MG tablet Take 81 mg by mouth daily.    Marland Kitchen etodolac (LODINE) 400 MG tablet Take 400 mg by mouth 2 (two) times daily.     Marland Kitchen HYDROcodone-acetaminophen (NORCO) 10-325 MG per tablet Take 1 tablet by mouth every 6 (six) hours as needed for moderate pain.     Marland Kitchen doxycycline (VIBRA-TABS) 100 MG tablet Take 1 tablet (100 mg total) by mouth 2 (two) times daily. (Patient not taking: Reported on 03/16/2015) 20 tablet 0  . ibuprofen (ADVIL,MOTRIN) 600 MG tablet Take 1 tablet (600 mg total) by mouth every 6 (six) hours as needed. (Patient not taking: Reported on 04/07/2015) 30 tablet 0   No current facility-administered medications for this visit.    No Known Allergies  Family History  Problem Relation Age of Onset  . Diabetes Other   . Hypertension Other   . Anesthesia problems Neg Hx   . Hypotension Neg Hx   . Malignant hyperthermia Neg Hx   . Pseudochol deficiency Neg Hx   . Cancer Mother   . Cancer Father      prostate cancer  . Diabetes Maternal Uncle     History   Social History  . Marital Status: Single    Spouse Name: N/A  . Number of Children: N/A  . Years of Education: N/A   Occupational History  . Not on file.   Social History Main Topics  . Smoking status: Never Smoker   . Smokeless tobacco: Not on file  . Alcohol Use: No  . Drug Use: No  . Sexual Activity: Yes   Other Topics Concern  . Not on file   Social History Narrative    ROS ALL NEGATIVE EXCEPT THOSE NOTED IN HPI  PE  General Appearance: well developed, well nourished in no acute distress, obese HEENT: symmetrical face, PERRLA, good dentition  Neck: no JVD, thyromegaly, or adenopathy, trachea midline Chest: symmetric without deformity Cardiac: PMI non-displaced, RRR, normal S1, S2, no gallop or murmur Lung: clear to ausculation and percussion Vascular: all pulses full without bruits  Extremities: no cyanosis, clubbing or edema, no sign of DVT, no varicosities  Skin: normal color, no rashes Neuro: alert and oriented x 3, non-focal Pysch: normal affect  EKG  BMET    Component Value Date/Time   NA 137 03/18/2015 1033   K 3.9 03/18/2015 1033   CL 105 03/18/2015 1033   CO2 25 03/18/2015 1033  GLUCOSE 104* 03/18/2015 1033   BUN 16 03/18/2015 1033   CREATININE 1.02 03/18/2015 1033   CREATININE 0.96 11/17/2014 1218   CALCIUM 9.8 03/18/2015 1033   GFRNONAA >60 03/18/2015 1033   GFRNONAA 87 11/17/2014 1218   GFRAA >60 03/18/2015 1033   GFRAA >89 11/17/2014 1218    Lipid Panel     Component Value Date/Time   CHOL 183 04/01/2014 1226   TRIG 43 04/01/2014 1226   HDL 60 04/01/2014 1226   CHOLHDL 3.1 04/01/2014 1226   VLDL 9 04/01/2014 1226   LDLCALC 114* 04/01/2014 1226    CBC    Component Value Date/Time   WBC 3.9* 03/18/2015 1033   RBC 4.76 03/18/2015 1033   HGB 13.8 03/18/2015 1033   HCT 41.4 03/18/2015 1033   PLT 221 03/18/2015 1033   MCV 87.0 03/18/2015 1033   MCH 29.0 03/18/2015  1033   MCHC 33.3 03/18/2015 1033   RDW 12.9 03/18/2015 1033   LYMPHSABS 1.2 10/03/2014 1126   MONOABS 0.6 10/03/2014 1126   EOSABS 0.0 10/03/2014 1126   BASOSABS 0.0 10/03/2014 1126

## 2015-04-07 NOTE — Assessment & Plan Note (Signed)
His left ventricular hypertrophy with strain. He has no cardiac symptoms. Echocardiograms confirms severe left ventricular hypertrophy with normal systolic function.  He's advised to take his medications for his blood pressure every day. He is cleared for surgery at low cardiovascular risk. No cardiology follow-up needed.

## 2015-04-22 ENCOUNTER — Other Ambulatory Visit (HOSPITAL_COMMUNITY): Payer: Self-pay | Admitting: Orthopaedic Surgery

## 2015-04-23 ENCOUNTER — Encounter (HOSPITAL_COMMUNITY): Payer: Self-pay

## 2015-04-23 ENCOUNTER — Encounter (HOSPITAL_COMMUNITY)
Admission: RE | Admit: 2015-04-23 | Discharge: 2015-04-23 | Disposition: A | Discharge status: Home / Self Care | Payer: Medicare Other | Source: Ambulatory Visit | Attending: Orthopaedic Surgery | Admitting: Orthopaedic Surgery

## 2015-04-23 ENCOUNTER — Other Ambulatory Visit: Payer: Self-pay | Admitting: Internal Medicine

## 2015-04-23 DIAGNOSIS — Z01812 Encounter for preprocedural laboratory examination: Secondary | ICD-10-CM | POA: Insufficient documentation

## 2015-04-23 DIAGNOSIS — M179 Osteoarthritis of knee, unspecified: Secondary | ICD-10-CM | POA: Diagnosis not present

## 2015-04-23 HISTORY — DX: Reserved for inherently not codable concepts without codable children: IMO0001

## 2015-04-23 LAB — URINALYSIS, ROUTINE W REFLEX MICROSCOPIC
Bilirubin Urine: NEGATIVE
GLUCOSE, UA: NEGATIVE mg/dL
HGB URINE DIPSTICK: NEGATIVE
KETONES UR: NEGATIVE mg/dL
Leukocytes, UA: NEGATIVE
Nitrite: NEGATIVE
PROTEIN: NEGATIVE mg/dL
Specific Gravity, Urine: 1.02 (ref 1.005–1.030)
Urobilinogen, UA: 0.2 mg/dL (ref 0.0–1.0)
pH: 6 (ref 5.0–8.0)

## 2015-04-23 LAB — BASIC METABOLIC PANEL
Anion gap: 9 (ref 5–15)
BUN: 12 mg/dL (ref 6–20)
CHLORIDE: 105 mmol/L (ref 101–111)
CO2: 25 mmol/L (ref 22–32)
CREATININE: 1.01 mg/dL (ref 0.61–1.24)
Calcium: 9.9 mg/dL (ref 8.9–10.3)
GFR calc non Af Amer: >60 mL/min (ref >60)
Glucose, Bld: 83 mg/dL (ref 65–99)
Potassium: 3.8 mmol/L (ref 3.5–5.1)
Sodium: 139 mmol/L (ref 135–145)

## 2015-04-23 LAB — PROTIME-INR
INR: 1.12 (ref 0.00–1.49)
Prothrombin Time: 14.6 seconds (ref 11.6–15.2)

## 2015-04-23 LAB — CBC
HEMATOCRIT: 41.1 % (ref 39.0–52.0)
Hemoglobin: 14.2 g/dL (ref 13.0–17.0)
MCH: 29.7 pg (ref 26.0–34.0)
MCHC: 34.5 g/dL (ref 30.0–36.0)
MCV: 86 fL (ref 78.0–100.0)
Platelets: 208 10*3/uL (ref 150–400)
RBC: 4.78 MIL/uL (ref 4.22–5.81)
RDW: 13 % (ref 11.5–15.5)
WBC: 4.2 10*3/uL (ref 4.0–10.5)

## 2015-04-23 LAB — SURGICAL PCR SCREEN
MRSA, PCR: NEGATIVE
Staphylococcus aureus: NEGATIVE

## 2015-04-23 NOTE — Pre-Procedure Instructions (Signed)
    Dustin [name]  04/23/2015      [facility] - [place], [place] - [address] [name] [address] Phone: [phone] Fax: [place], [place] [address] Phone: [phone] Fax: [phone]    Your procedure is scheduled on 04-30-2015   Report to [facility] Admitting at 5:30 A.M.   Call this number if you have problems the morning of surgery:  [phone]   Remember:   Do not eat food or drink liquids after midnight.   Take these medicines the morning of surgery with A SIP OF WATER Amlodipine(Norvasc),pain medication if needed     Do not wear jewelry  Do not wear lotions, powders, or perfumes.    Do not shave 48 hours prior to surgery.  Men may shave face and neck  Do not bring valuables to the hospital.  [facility] is not responsible for any belongings or valuables.  Contacts, dentures or bridgework may not be worn into surgery.  Leave your suitcase in the car.  After surgery it may be brought to your room.  For patients admitted to the hospital, discharge time will be determined by your treatment team.     Special instructions:  See attached sheet for instructions on CHG shower/bath  Please read over the following fact sheets that you were given. Pain Booklet, Coughing and Deep Breathing and Surgical Site Infection Prevention

## 2015-04-29 MED ORDER — CEFAZOLIN SODIUM-DEXTROSE 2-3 GM-% IV SOLR
2.0000 g | INTRAVENOUS | Status: AC
  Administered 2015-04-30: 2 g via INTRAVENOUS
  Filled 2015-04-29: qty 50

## 2015-04-29 NOTE — H&P (Signed)
TOTAL KNEE ADMISSION H&P  Patient is being admitted for right total knee arthroplasty.  Subjective:  Chief Complaint:right knee pain.  HPI: Dustin [name], 59 y.o. male, has a history of pain and functional disability in the right knee due to arthritis and has failed non-surgical conservative treatments for greater than 12 weeks to includeNSAID's and/or analgesics, use of assistive devices, weight reduction as appropriate and activity modification.  Onset of symptoms was gradual, starting 5 years ago with gradually worsening course since that time.   Patient currently rates pain in the right knee(s) at 10 out of 10 with activity. Patient has night pain, worsening of pain with activity and weight bearing, pain with passive range of motion and joint swelling.  Patient has evidence of subchondral cysts, subchondral sclerosis, periarticular osteophytes and joint space narrowing by imaging studies. . There is no active infection.  Patient Active Problem List   Diagnosis Date Noted  . Left ventricular hypertrophy by electrocardiogram 04/07/2015  . Essential hypertension, benign 04/01/2014  . Osteoarthritis 04/01/2014  . Knee pain, right 04/01/2014   Past Medical History  Diagnosis Date  . History of blood clots     to left arm  . Arthritis     bil knees, left hand  . DVT of upper extremity (deep vein thrombosis)     left brachial vein, 12/2010  . Hypertension   . Shortness of breath dyspnea     with exertion    Past Surgical History  Procedure Laterality Date  . Left  hand   2011    cyst removed.  . Total knee arthroplasty  10/19/2011    Procedure: TOTAL KNEE ARTHROPLASTY;  Surgeon: Sharmon Revere;  Location: [place];  Service: Orthopedics;  Laterality: Left;  . Colonscopy       1 year ago     No prescriptions prior to admission   No Known Allergies  History  Substance Use Topics  . Smoking status: Never Smoker   . Smokeless tobacco: Not on file  . Alcohol Use: No    Family  History  Problem Relation Age of Onset  . Diabetes Other   . Hypertension Other   . Anesthesia problems Neg Hx   . Hypotension Neg Hx   . Malignant hyperthermia Neg Hx   . Pseudochol deficiency Neg Hx   . Cancer Mother   . Cancer Father     prostate cancer  . Diabetes Maternal Uncle      Review of Systems  Constitutional: Negative.   HENT: Negative.   Eyes: Negative.   Respiratory: Negative.   Cardiovascular: Negative.   Gastrointestinal: Negative.   Musculoskeletal: Positive for joint pain.  Neurological: Negative.   Psychiatric/Behavioral: Negative.     Objective:  Physical Exam  Constitutional: He is oriented to person, place, and time. He appears well-developed.  HENT:  Head: Normocephalic.  Eyes: EOM are normal. Pupils are equal, round, and reactive to light.  Neck: Normal range of motion.  Respiratory: Effort normal.  GI: He exhibits no distension.  Musculoskeletal: He exhibits tenderness.  Neurological: He is alert and oriented to person, place, and time.  Skin: Skin is warm and dry.  Psychiatric: He has a normal mood and affect.    Vital signs in last 24 hours:    Labs:   Estimated body mass index is 29.67 kg/(m^2) as calculated from the following:   Height as of 04/07/15: 6' (1.829 m).   Weight as of 04/07/15: 99.247 kg (218 lb 12.8  oz).   Imaging Review Plain radiographs demonstrate moderate degenerative joint disease of the right knee(s). The overall alignment ismild varus. The bone quality appears to be good for age and reported activity level.  Assessment/Plan:  End stage arthritis, right knee   The patient history, physical examination, clinical judgment of the provider and imaging studies are consistent with end stage degenerative joint disease of the right knee(s) and total knee arthroplasty is deemed medically necessary. The treatment options including medical management, injection therapy arthroscopy and arthroplasty were discussed at length.  The risks and benefits of total knee arthroplasty were presented and reviewed. The risks due to aseptic loosening, infection, stiffness, patella tracking problems, thromboembolic complications and other imponderables were discussed. The patient acknowledged the explanation, agreed to proceed with the plan and consent was signed. Patient is being admitted for inpatient treatment for surgery, pain control, PT, OT, prophylactic antibiotics, VTE prophylaxis, progressive ambulation and ADL's and discharge planning. The patient is planning to be discharged home with home health services

## 2015-04-30 ENCOUNTER — Encounter (HOSPITAL_COMMUNITY): Payer: Self-pay | Admitting: *Deleted

## 2015-04-30 ENCOUNTER — Inpatient Hospital Stay (HOSPITAL_COMMUNITY): Payer: Medicare Other | Admitting: Anesthesiology

## 2015-04-30 ENCOUNTER — Inpatient Hospital Stay (HOSPITAL_COMMUNITY): Payer: Medicare Other

## 2015-04-30 ENCOUNTER — Inpatient Hospital Stay (HOSPITAL_COMMUNITY)
Admission: RE | Admit: 2015-04-30 | Discharge: 2015-05-04 | DRG: 470 | Disposition: A | Discharge status: Skilled Nursing Facility | Payer: Medicare Other | Source: Ambulatory Visit | Attending: Orthopaedic Surgery | Admitting: Orthopaedic Surgery

## 2015-04-30 ENCOUNTER — Encounter (HOSPITAL_COMMUNITY)
Admission: RE | Disposition: A | Discharge status: Skilled Nursing Facility | Payer: Self-pay | Source: Ambulatory Visit | Attending: Orthopaedic Surgery

## 2015-04-30 DIAGNOSIS — Z96652 Presence of left artificial knee joint: Secondary | ICD-10-CM | POA: Diagnosis present

## 2015-04-30 DIAGNOSIS — M1712 Unilateral primary osteoarthritis, left knee: Secondary | ICD-10-CM | POA: Diagnosis not present

## 2015-04-30 DIAGNOSIS — M25561 Pain in right knee: Secondary | ICD-10-CM | POA: Diagnosis present

## 2015-04-30 DIAGNOSIS — G8918 Other acute postprocedural pain: Secondary | ICD-10-CM | POA: Diagnosis not present

## 2015-04-30 DIAGNOSIS — I10 Essential (primary) hypertension: Secondary | ICD-10-CM | POA: Diagnosis present

## 2015-04-30 DIAGNOSIS — I517 Cardiomegaly: Secondary | ICD-10-CM | POA: Diagnosis not present

## 2015-04-30 DIAGNOSIS — Z96651 Presence of right artificial knee joint: Secondary | ICD-10-CM | POA: Diagnosis not present

## 2015-04-30 DIAGNOSIS — M19042 Primary osteoarthritis, left hand: Secondary | ICD-10-CM | POA: Diagnosis not present

## 2015-04-30 DIAGNOSIS — M1711 Unilateral primary osteoarthritis, right knee: Secondary | ICD-10-CM | POA: Diagnosis present

## 2015-04-30 DIAGNOSIS — M179 Osteoarthritis of knee, unspecified: Secondary | ICD-10-CM | POA: Diagnosis not present

## 2015-04-30 DIAGNOSIS — R2681 Unsteadiness on feet: Secondary | ICD-10-CM | POA: Diagnosis not present

## 2015-04-30 DIAGNOSIS — Z7982 Long term (current) use of aspirin: Secondary | ICD-10-CM | POA: Diagnosis not present

## 2015-04-30 DIAGNOSIS — M6281 Muscle weakness (generalized): Secondary | ICD-10-CM | POA: Diagnosis not present

## 2015-04-30 DIAGNOSIS — D62 Acute posthemorrhagic anemia: Secondary | ICD-10-CM | POA: Diagnosis not present

## 2015-04-30 DIAGNOSIS — Z86718 Personal history of other venous thrombosis and embolism: Secondary | ICD-10-CM

## 2015-04-30 DIAGNOSIS — Z471 Aftercare following joint replacement surgery: Secondary | ICD-10-CM | POA: Diagnosis not present

## 2015-04-30 DIAGNOSIS — Z09 Encounter for follow-up examination after completed treatment for conditions other than malignant neoplasm: Secondary | ICD-10-CM

## 2015-04-30 HISTORY — PX: KNEE ARTHROPLASTY: SHX992

## 2015-04-30 LAB — CBC
HEMATOCRIT: 39.4 % (ref 39.0–52.0)
HEMOGLOBIN: 13.5 g/dL (ref 13.0–17.0)
MCH: 29.5 pg (ref 26.0–34.0)
MCHC: 34.3 g/dL (ref 30.0–36.0)
MCV: 86 fL (ref 78.0–100.0)
Platelets: 235 10*3/uL (ref 150–400)
RBC: 4.58 MIL/uL (ref 4.22–5.81)
RDW: 13 % (ref 11.5–15.5)
WBC: 10.1 10*3/uL (ref 4.0–10.5)

## 2015-04-30 LAB — CREATININE, SERUM
CREATININE: 1.02 mg/dL (ref 0.61–1.24)
GFR calc Af Amer: >60 mL/min (ref >60)
GFR calc non Af Amer: >60 mL/min (ref >60)

## 2015-04-30 SURGERY — ARTHROPLASTY, KNEE, TOTAL, USING IMAGELESS COMPUTER-ASSISTED NAVIGATION
Anesthesia: Spinal | Site: Knee | Laterality: Right

## 2015-04-30 MED ORDER — METHOCARBAMOL 500 MG PO TABS: 500.0000 mg | ORAL_TABLET | Freq: Four times a day (QID) | ORAL | Status: DC

## 2015-04-30 MED ORDER — ACETAMINOPHEN 325 MG PO TABS
650.0000 mg | ORAL_TABLET | Freq: Four times a day (QID) | ORAL | Status: DC | PRN
  Administered 2015-05-02 – 2015-05-04 (×3): 650 mg via ORAL
  Filled 2015-04-30 (×3): qty 2

## 2015-04-30 MED ORDER — AMLODIPINE BESYLATE 5 MG PO TABS
5.0000 mg | ORAL_TABLET | Freq: Every day | ORAL | Status: DC
Start: 2015-05-01 — End: 2015-05-04
  Administered 2015-05-01 – 2015-05-04 (×4): 5 mg via ORAL
  Filled 2015-04-30 (×4): qty 1

## 2015-04-30 MED ORDER — DEXTROSE 5 % IV SOLN: 500.0000 mg | Freq: Four times a day (QID) | INTRAVENOUS | Status: DC | PRN

## 2015-04-30 MED ORDER — POTASSIUM CHLORIDE IN NACL 20-0.45 MEQ/L-% IV SOLN
INTRAVENOUS | Status: DC
  Administered 2015-04-30 – 2015-05-02 (×2): via INTRAVENOUS
  Administered 2015-05-03: 90 mL/h via INTRAVENOUS
  Filled 2015-04-30 (×11): qty 1000

## 2015-04-30 MED ORDER — OXYCODONE HCL 5 MG/5ML PO SOLN: 5.0000 mg | Freq: Once | ORAL | Status: DC | PRN

## 2015-04-30 MED ORDER — PROPOFOL 10 MG/ML IV BOLUS
INTRAVENOUS | Status: AC
  Filled 2015-04-30: qty 20

## 2015-04-30 MED ORDER — MIDAZOLAM HCL 2 MG/2ML IJ SOLN
INTRAMUSCULAR | Status: AC
  Filled 2015-04-30: qty 2

## 2015-04-30 MED ORDER — ONDANSETRON HCL 4 MG PO TABS
4.0000 mg | ORAL_TABLET | Freq: Four times a day (QID) | ORAL | Status: DC | PRN
  Administered 2015-05-02: 4 mg via ORAL
  Filled 2015-04-30: qty 1

## 2015-04-30 MED ORDER — MENTHOL 3 MG MT LOZG: 1.0000 | LOZENGE | OROMUCOSAL | Status: DC | PRN

## 2015-04-30 MED ORDER — FENTANYL CITRATE (PF) 100 MCG/2ML IJ SOLN
INTRAMUSCULAR | Status: DC | PRN
  Administered 2015-04-30 (×2): 50 ug via INTRAVENOUS
  Administered 2015-04-30: 25 ug via INTRAVENOUS
  Administered 2015-04-30: 50 ug via INTRAVENOUS

## 2015-04-30 MED ORDER — FENTANYL CITRATE (PF) 100 MCG/2ML IJ SOLN: 25.0000 ug | INTRAMUSCULAR | Status: DC | PRN

## 2015-04-30 MED ORDER — OXYCODONE-ACETAMINOPHEN 7.5-325 MG PO TABS: 1.0000 | ORAL_TABLET | ORAL | Status: DC | PRN

## 2015-04-30 MED ORDER — ONDANSETRON HCL 4 MG/2ML IJ SOLN: 4.0000 mg | Freq: Four times a day (QID) | INTRAMUSCULAR | Status: DC | PRN

## 2015-04-30 MED ORDER — METOCLOPRAMIDE HCL 5 MG/ML IJ SOLN: 5.0000 mg | Freq: Three times a day (TID) | INTRAMUSCULAR | Status: DC | PRN

## 2015-04-30 MED ORDER — METHOCARBAMOL 500 MG PO TABS
500.0000 mg | ORAL_TABLET | Freq: Four times a day (QID) | ORAL | Status: DC | PRN
  Administered 2015-04-30 – 2015-05-04 (×13): 500 mg via ORAL
  Filled 2015-04-30 (×13): qty 1

## 2015-04-30 MED ORDER — FLEET ENEMA 7-19 GM/118ML RE ENEM: 1.0000 | ENEMA | Freq: Once | RECTAL | Status: AC | PRN

## 2015-04-30 MED ORDER — DOCUSATE SODIUM 100 MG PO CAPS
100.0000 mg | ORAL_CAPSULE | Freq: Two times a day (BID) | ORAL | Status: DC
  Administered 2015-04-30 – 2015-05-04 (×9): 100 mg via ORAL
  Filled 2015-04-30 (×9): qty 1

## 2015-04-30 MED ORDER — HYDROMORPHONE HCL 1 MG/ML IJ SOLN
1.0000 mg | INTRAMUSCULAR | Status: DC | PRN
  Administered 2015-04-30 – 2015-05-03 (×8): 1 mg via INTRAVENOUS
  Filled 2015-04-30 (×8): qty 1

## 2015-04-30 MED ORDER — MIDAZOLAM HCL 5 MG/5ML IJ SOLN
INTRAMUSCULAR | Status: DC | PRN
  Administered 2015-04-30 (×4): 1 mg via INTRAVENOUS

## 2015-04-30 MED ORDER — PROPOFOL INFUSION 10 MG/ML OPTIME
INTRAVENOUS | Status: DC | PRN
  Administered 2015-04-30: 50 ug/kg/min via INTRAVENOUS

## 2015-04-30 MED ORDER — POLYETHYLENE GLYCOL 3350 17 G PO PACK
17.0000 g | PACK | Freq: Every day | ORAL | Status: DC | PRN
  Administered 2015-05-04: 17 g via ORAL
  Filled 2015-04-30: qty 1

## 2015-04-30 MED ORDER — PHENOL 1.4 % MT LIQD: 1.0000 | OROMUCOSAL | Status: DC | PRN

## 2015-04-30 MED ORDER — ASPIRIN EC 325 MG PO TBEC: 325.0000 mg | DELAYED_RELEASE_TABLET | Freq: Every day | ORAL | Status: DC

## 2015-04-30 MED ORDER — METOCLOPRAMIDE HCL 5 MG PO TABS: 5.0000 mg | ORAL_TABLET | Freq: Three times a day (TID) | ORAL | Status: DC | PRN

## 2015-04-30 MED ORDER — ONDANSETRON HCL 4 MG/2ML IJ SOLN: 4.0000 mg | Freq: Once | INTRAMUSCULAR | Status: DC | PRN

## 2015-04-30 MED ORDER — LACTATED RINGERS IV SOLN
INTRAVENOUS | Status: DC | PRN
  Administered 2015-04-30 (×2): via INTRAVENOUS

## 2015-04-30 MED ORDER — SODIUM CHLORIDE 0.9 % IR SOLN
Status: DC | PRN
  Administered 2015-04-30: 3000 mL

## 2015-04-30 MED ORDER — FENTANYL CITRATE (PF) 250 MCG/5ML IJ SOLN
INTRAMUSCULAR | Status: AC
  Filled 2015-04-30: qty 5

## 2015-04-30 MED ORDER — CHLORHEXIDINE GLUCONATE 4 % EX LIQD: 60.0000 mL | Freq: Once | CUTANEOUS | Status: DC

## 2015-04-30 MED ORDER — OXYCODONE HCL 5 MG PO TABS: 5.0000 mg | ORAL_TABLET | Freq: Once | ORAL | Status: DC | PRN

## 2015-04-30 MED ORDER — ENOXAPARIN SODIUM 30 MG/0.3ML ~~LOC~~ SOLN
30.0000 mg | Freq: Two times a day (BID) | SUBCUTANEOUS | Status: DC
  Administered 2015-05-01 – 2015-05-04 (×7): 30 mg via SUBCUTANEOUS
  Filled 2015-04-30 (×6): qty 0.3

## 2015-04-30 MED ORDER — OXYCODONE HCL 5 MG PO TABS
5.0000 mg | ORAL_TABLET | ORAL | Status: DC | PRN
  Administered 2015-04-30 – 2015-05-04 (×21): 10 mg via ORAL
  Administered 2015-05-04: 5 mg via ORAL
  Administered 2015-05-04 (×2): 10 mg via ORAL
  Filled 2015-04-30 (×24): qty 2

## 2015-04-30 MED ORDER — ONDANSETRON HCL 4 MG/2ML IJ SOLN
INTRAMUSCULAR | Status: DC | PRN
  Administered 2015-04-30: 4 mg via INTRAVENOUS

## 2015-04-30 MED ORDER — ACETAMINOPHEN 650 MG RE SUPP: 650.0000 mg | Freq: Four times a day (QID) | RECTAL | Status: DC | PRN

## 2015-04-30 SURGICAL SUPPLY — 68 items
APL SKNCLS STERI-STRIP NONHPOA (GAUZE/BANDAGES/DRESSINGS) ×1
BANDAGE ELASTIC 4 VELCRO ST LF (GAUZE/BANDAGES/DRESSINGS) ×3 IMPLANT
BANDAGE ELASTIC 6 VELCRO ST LF (GAUZE/BANDAGES/DRESSINGS) ×2 IMPLANT
BANDAGE ESMARK 6X9 LF (GAUZE/BANDAGES/DRESSINGS) ×1 IMPLANT
BENZOIN TINCTURE PRP APPL 2/3 (GAUZE/BANDAGES/DRESSINGS) ×3 IMPLANT
BLADE SAGITTAL 25.0X1.19X90 (BLADE) ×2 IMPLANT
BLADE SAGITTAL 25.0X1.19X90MM (BLADE) ×1
BLADE SAW SGTL 13X75X1.27 (BLADE) ×3 IMPLANT
BNDG CMPR 9X6 STRL LF SNTH (GAUZE/BANDAGES/DRESSINGS) ×1
BNDG CMPR MED 10X6 ELC LF (GAUZE/BANDAGES/DRESSINGS) ×1
BNDG ELASTIC 6X10 VLCR STRL LF (GAUZE/BANDAGES/DRESSINGS) ×3 IMPLANT
BNDG ESMARK 6X9 LF (GAUZE/BANDAGES/DRESSINGS) ×3
BOWL SMART MIX CTS (DISPOSABLE) ×3 IMPLANT
CAP KNEE TOTAL 3 SIGMA ×2 IMPLANT
CEMENT HV SMART SET (Cement) ×6 IMPLANT
CLOSURE STERI-STRIP 1/2X4 (GAUZE/BANDAGES/DRESSINGS) ×1
CLSR STERI-STRIP ANTIMIC 1/2X4 (GAUZE/BANDAGES/DRESSINGS) ×3 IMPLANT
COVER SURGICAL LIGHT HANDLE (MISCELLANEOUS) ×3 IMPLANT
CUFF TOURNIQUET SINGLE 34IN LL (TOURNIQUET CUFF) ×3 IMPLANT
CUFF TOURNIQUET SINGLE 44IN (TOURNIQUET CUFF) IMPLANT
DRAPE ORTHO SPLIT 77X108 STRL (DRAPES) ×6
DRAPE SURG ORHT 6 SPLT 77X108 (DRAPES) ×2 IMPLANT
DRAPE U-SHAPE 47X51 STRL (DRAPES) ×3 IMPLANT
DRSG PAD ABDOMINAL 8X10 ST (GAUZE/BANDAGES/DRESSINGS) ×4 IMPLANT
DURAPREP 26ML APPLICATOR (WOUND CARE) ×3 IMPLANT
ELECT REM PT RETURN 9FT ADLT (ELECTROSURGICAL) ×3
ELECTRODE REM PT RTRN 9FT ADLT (ELECTROSURGICAL) ×1 IMPLANT
FACESHIELD WRAPAROUND (MASK) ×6 IMPLANT
FACESHIELD WRAPAROUND OR TEAM (MASK) ×2 IMPLANT
GAUZE SPONGE 4X4 12PLY STRL (GAUZE/BANDAGES/DRESSINGS) ×6 IMPLANT
GAUZE XEROFORM 5X9 LF (GAUZE/BANDAGES/DRESSINGS) ×3 IMPLANT
GLOVE BIOGEL PI IND STRL 8 (GLOVE) ×2 IMPLANT
GLOVE BIOGEL PI INDICATOR 8 (GLOVE) ×4
GLOVE ORTHO TXT STRL SZ7.5 (GLOVE) ×6 IMPLANT
GOWN STRL REUS W/ TWL LRG LVL3 (GOWN DISPOSABLE) ×1 IMPLANT
GOWN STRL REUS W/ TWL XL LVL3 (GOWN DISPOSABLE) ×1 IMPLANT
GOWN STRL REUS W/TWL 2XL LVL3 (GOWN DISPOSABLE) ×3 IMPLANT
GOWN STRL REUS W/TWL LRG LVL3 (GOWN DISPOSABLE) ×3
GOWN STRL REUS W/TWL XL LVL3 (GOWN DISPOSABLE) ×3
HANDPIECE INTERPULSE COAX TIP (DISPOSABLE) ×3
IMMOBILIZER KNEE 22 UNIV (SOFTGOODS) ×3 IMPLANT
KIT BASIN OR (CUSTOM PROCEDURE TRAY) ×3 IMPLANT
KIT ROOM TURNOVER OR (KITS) ×3 IMPLANT
MANIFOLD NEPTUNE II (INSTRUMENTS) ×3 IMPLANT
MARKER SPHERE PSV REFLC THRD 5 (MARKER) ×9 IMPLANT
NDL HYPO 25GX1X1/2 BEV (NEEDLE) ×1 IMPLANT
NEEDLE HYPO 25GX1X1/2 BEV (NEEDLE) ×3 IMPLANT
NS IRRIG 1000ML POUR BTL (IV SOLUTION) ×3 IMPLANT
PACK TOTAL JOINT (CUSTOM PROCEDURE TRAY) ×3 IMPLANT
PACK UNIVERSAL I (CUSTOM PROCEDURE TRAY) ×3 IMPLANT
PAD ARMBOARD 7.5X6 YLW CONV (MISCELLANEOUS) ×6 IMPLANT
PAD CAST 4YDX4 CTTN HI CHSV (CAST SUPPLIES) ×1 IMPLANT
PADDING CAST COTTON 4X4 STRL (CAST SUPPLIES) ×3
PADDING CAST COTTON 6X4 STRL (CAST SUPPLIES) ×4 IMPLANT
PIN SCHANZ 4MM 130MM (PIN) ×12 IMPLANT
SET HNDPC FAN SPRY TIP SCT (DISPOSABLE) ×1 IMPLANT
SPONGE GAUZE 4X4 12PLY STER LF (GAUZE/BANDAGES/DRESSINGS) ×2 IMPLANT
STAPLER VISISTAT 35W (STAPLE) IMPLANT
SUCTION FRAZIER TIP 10 FR DISP (SUCTIONS) ×3 IMPLANT
SUT VIC AB 1 CTX 36 (SUTURE) ×6
SUT VIC AB 1 CTX36XBRD ANBCTR (SUTURE) ×2 IMPLANT
SUT VIC AB 2-0 CT1 27 (SUTURE) ×6
SUT VIC AB 2-0 CT1 TAPERPNT 27 (SUTURE) ×2 IMPLANT
SUT VIC AB 3-0 X1 27 (SUTURE) ×6 IMPLANT
SYR CONTROL 10ML LL (SYRINGE) ×3 IMPLANT
TOWEL OR 17X24 6PK STRL BLUE (TOWEL DISPOSABLE) ×3 IMPLANT
TOWEL OR 17X26 10 PK STRL BLUE (TOWEL DISPOSABLE) ×3 IMPLANT
WATER STERILE IRR 1000ML POUR (IV SOLUTION) ×3 IMPLANT

## 2015-04-30 NOTE — Brief Op Note (Signed)
04/30/2015  10:24 AM  PATIENT:  Doristine Mango  59 y.o. male  PRE-OPERATIVE DIAGNOSIS:  Osteoarthritis Right Knee  POST-OPERATIVE DIAGNOSIS:  Osteoarthritis Right Knee  PROCEDURE:  Procedure(s): COMPUTER ASSISTED TOTAL KNEE ARTHROPLASTY (Right)  SURGEON:  Surgeon(s) and Role:    Marybelle Killings, MD - Primary  PHYSICIAN ASSISTANT: Ploeger m. Jeorge Reister pa-c    ANESTHESIA:   general  EBL:  Total I/O In: 1200 [I.V.:1200] Out: 500 [Urine:350; Blood:150]  BLOOD ADMINISTERED:none  DRAINS: none   LOCAL MEDICATIONS USED:  NONE  SPECIMEN:  No Specimen  DISPOSITION OF SPECIMEN:  N/A  COUNTS:  YES  TOURNIQUET:   Total Tourniquet Time Documented: Thigh (Right) - 111 minutes Total: Thigh (Right) - 111 minutes    PATIENT DISPOSITION:  PACU - hemodynamically stable.

## 2015-04-30 NOTE — Discharge Instructions (Signed)

## 2015-04-30 NOTE — Transfer of Care (Signed)
Immediate Anesthesia Transfer of Care Note  Patient: Dustin [name]  Procedure(s) Performed: Procedure(s): COMPUTER ASSISTED TOTAL KNEE ARTHROPLASTY (Right)  Patient Location: PACU  Anesthesia Type:Spinal  Level of Consciousness: awake and alert   Airway & Oxygen Therapy: Patient Spontanous Breathing  Post-op Assessment: Report given to RN and Post -op Vital signs reviewed and stable  Post vital signs: Reviewed and stable  Last Vitals:  Filed Vitals:   04/30/15 0552  BP: 139/82  Pulse: 88  Temp: 36.7 C  Resp: 18    Complications: No apparent anesthesia complications

## 2015-04-30 NOTE — Anesthesia Procedure Notes (Addendum)
Spinal  Start time: 04/30/2015 7:36 AM End time: 04/30/2015 7:40 AM Staffing Performed by: anesthesiologist  Preanesthetic Checklist Completed: patient identified, site marked, surgical consent, pre-op evaluation, timeout performed, IV checked, risks and benefits discussed and monitors and equipment checked Spinal Block Patient position: right lateral decubitus Prep: ChloraPrep Patient monitoring: heart rate, cardiac monitor, continuous pulse ox and blood pressure Approach: right paramedian Location: L3-4 Injection technique: single-shot Needle Needle type: Tuohy  Needle gauge: 22 G Needle length: 9 cm Assessment Sensory level: T10 Additional Notes 10 mg 0.75% bupivacaine with 1:200 EPI INJECTED EASILY  Procedure Name: MAC Date/Time: 04/30/2015 7:37 AM Performed by: Suzy Bouchard Pre-anesthesia Checklist: Patient identified, Emergency Drugs available, Suction available, Patient being monitored and Timeout performed Patient Re-evaluated:Patient Re-evaluated prior to inductionOxygen Delivery Method: Simple face mask    Anesthesia Regional Block:  Adductor canal block  Pre-Anesthetic Checklist: ,, timeout performed, Correct Patient, Correct Site, Correct Laterality, Correct Procedure, Correct Position, site marked, Risks and benefits discussed,  Surgical consent,  Pre-op evaluation,  At surgeon's request and post-op pain management  Laterality: Right  Prep: chloraprep       Needles:  Injection technique: Single-shot  Needle Type: Echogenic Stimulator Needle     Needle Length: 9cm 9 cm Needle Gauge: 22 and 22 G    Additional Needles:  Procedures: ultrasound guided (picture in chart) Adductor canal block Narrative:  Start time: 04/30/2015 7:15 AM End time: 04/30/2015 7:20 AM Injection made incrementally with aspirations every 5 mL.  Performed by: Personally   Additional Notes:  Cc 0.5% bupivacainewith 1:200 epi injected easily

## 2015-04-30 NOTE — Evaluation (Signed)
Physical Therapy Evaluation Patient Details Name: Dustin [name] MRN: [id number] DOB: 10/24/1956 Today's Date: 04/30/2015   History of Present Illness  R TKA   Clinical Impression  Pt is s/p TKA resulting in the deficits listed below (see PT Problem List). Pt will benefit from skilled PT to increase their independence and safety with mobility. Patient reports that he lives alone and doesn't have anyone else to help when he leaves the hospital. He reported that he is hoping to be able to get some additional rehabilitation before going hom.e Patient states that he believes that he has a standard walker at home but that was quite awhile ago. Able to transfer from bed to chair this afternoon with +1 assist.       Follow Up Recommendations Other (comment) (Undetermined-no support at home. )    Equipment Recommendations  None recommended by PT (To be determined)    Recommendations for Other Services       Precautions / Restrictions Precautions Precautions: Knee Required Braces or Orthoses: Knee Immobilizer - Right Knee Immobilizer - Right: On at all times;On except when in CPM Restrictions Weight Bearing Restrictions: Yes RLE Weight Bearing: Weight bearing as tolerated      Mobility  Bed Mobility Overal bed mobility: Needs Assistance Bed Mobility: Supine to Sit     Supine to sit: Mod assist     General bed mobility comments: mod assist with Rt leg  Transfers Overall transfer level: Needs assistance Equipment used: Rolling walker (2 wheeled) Transfers: Sit to/from Stand Sit to Stand: Min assist            Ambulation/Gait Ambulation/Gait assistance: Min assist Ambulation Distance (Feet): 2 Feet Assistive device: Rolling walker (2 wheeled) Gait Pattern/deviations: Step-to pattern;Decreased weight shift to right        Stairs            Wheelchair Mobility    Modified Rankin (Stroke Patients Only)       Balance Overall balance assessment: Needs  assistance Sitting-balance support: Bilateral upper extremity supported Sitting balance-Leahy Scale: Fair     Standing balance support: Bilateral upper extremity supported Standing balance-Leahy Scale: Poor                               Pertinent Vitals/Pain Pain Assessment: 0-10 Pain Score: 7  Pain Descriptors / Indicators: Aching;Stabbing Pain Intervention(s): Monitored during session;Limited activity within patient's tolerance    Home Living Family/patient expects to be discharged to:: Skilled nursing facility Living Arrangements: Alone Available Help at Discharge:  (none) Type of Home: Apartment Home Access: Level entry     Home Layout: One level Home Equipment: Walker - standard Additional Comments: Reports that he lives alone and will probably need more rehab before going home.     Prior Function Level of Independence: Independent               Hand Dominance        Extremity/Trunk Assessment               Lower Extremity Assessment: RLE deficits/detail RLE Deficits / Details: poor quad activation, no active staight leg raise       Communication   Communication: No difficulties  Cognition Arousal/Alertness: Awake/alert Behavior During Therapy: WFL for tasks assessed/performed Overall Cognitive Status: Within Functional Limits for tasks assessed  General Comments      Exercises        Assessment/Plan    PT Assessment Patient needs continued PT services  PT Diagnosis Difficulty walking;Generalized weakness;Acute pain   PT Problem List Decreased strength;Decreased range of motion;Decreased activity tolerance;Decreased mobility  PT Treatment Interventions Gait training;Functional mobility training;Therapeutic activities;Therapeutic exercise;Patient/family education   PT Goals (Current goals can be found in the Care Plan section) Acute Rehab PT Goals Patient Stated Goal: Get going again PT Goal  Formulation: With patient Time For Goal Achievement: 05/14/15 Potential to Achieve Goals: Good    Frequency 7X/week   Barriers to discharge   Patient lives alone, no available support    Co-evaluation               End of Session Equipment Utilized During Treatment: Gait belt;Right knee immobilizer Activity Tolerance: Patient limited by pain;Patient limited by fatigue Patient left: in chair;with call bell/phone within reach           Time: 1440-1507 PT Time Calculation (min) (ACUTE ONLY): 27 min   Charges:   PT Evaluation $Initial PT Evaluation Tier I: 1 Procedure PT Treatments $Therapeutic Activity: 8-22 mins   PT G Codes:        Cassell Clement, PT, CSCS Pager [phone] Office [phone]  04/30/2015, 3:24 PM

## 2015-04-30 NOTE — Interval H&P Note (Signed)
History and Physical Interval Note:  04/30/2015 7:26 AM  Dustin [name]  has presented today for surgery, with the diagnosis of Osteoarthritis Right Knee  The various methods of treatment have been discussed with the patient and family. After consideration of risks, benefits and other options for treatment, the patient has consented to  Procedure(s): COMPUTER ASSISTED TOTAL KNEE ARTHROPLASTY (Right) as a surgical intervention .  The patient's history has been reviewed, patient examined, no change in status, stable for surgery.  I have reviewed the patient's chart and labs.  Questions were answered to the patient's satisfaction.  Knee arthritis, Primary OA with erosive changes and 15 degrees varus deformity progressive. Previous left TKA done elsewhere functioning well.    [name]

## 2015-04-30 NOTE — Progress Notes (Signed)
Orthopedic Tech Progress Note Patient Details:  Dustin [name] [birth year] [id number] On cpm at 7:10 pm Patient ID: Dustin [name], male   DOB: 1955/11/09, 59 y.o.   MRN: [id number]   Braulio Bosch 04/30/2015, 7:10 PM

## 2015-04-30 NOTE — Anesthesia Postprocedure Evaluation (Signed)
  Anesthesia Post-op Note  Patient: Dustin [name]  Procedure(s) Performed: Procedure(s): COMPUTER ASSISTED TOTAL KNEE ARTHROPLASTY (Right)  Patient Location: PACU  Anesthesia Type:Spinal  Level of Consciousness: awake, alert  and oriented  Airway and Oxygen Therapy: Patient Spontanous Breathing and Patient connected to nasal cannula oxygen  Post-op Pain: none  Post-op Assessment: Post-op Vital signs reviewed, Patient's Cardiovascular Status Stable, Respiratory Function Stable and Patent Airway LLE Motor Response: Purposeful movement   RLE Motor Response: Purposeful movement, Responds to commands   L Sensory Level:  (able to bend knee) R Sensory Level: S1-Sole of foot, small toes  Post-op Vital Signs: stable  Last Vitals:  Filed Vitals:   04/30/15 1205  BP: 132/83  Pulse: 65  Temp: 36.8 C  Resp: 16    Complications: No apparent anesthesia complications

## 2015-04-30 NOTE — Care Management (Signed)
Utilization review completed. Nancy Manuele, RN Case Manager [phone]. 

## 2015-04-30 NOTE — Progress Notes (Signed)
Orthopedic Tech Progress Note Patient Details:  Dustin [name] 07-Aug-1956 [id number]  CPM Right Knee CPM Right Knee: On Right Knee Flexion (Degrees): 60 Right Knee Extension (Degrees): 0 Additional Comments: trapeze bar patient helper Viewed order from doctor's order list  Hildred Priest 04/30/2015, 11:25 AM

## 2015-04-30 NOTE — Anesthesia Preprocedure Evaluation (Addendum)
Anesthesia Evaluation  Patient identified by MRN, date of birth, ID band Patient awake    Reviewed: Allergy & Precautions, NPO status , Patient's Chart, lab work & pertinent test results  Airway Mallampati: II  TM Distance: >3 FB Neck ROM: Full    Dental  (+) Partial Upper, Dental Advisory Given   Pulmonary  breath sounds clear to auscultation        Cardiovascular hypertension, Pt. on medications Rhythm:Regular Rate:Normal     Neuro/Psych    GI/Hepatic   Endo/Other    Renal/GU      Musculoskeletal  (+) Arthritis -,   Abdominal   Peds  Hematology   Anesthesia Other Findings   Reproductive/Obstetrics                            Anesthesia Physical Anesthesia Plan  ASA: III  Anesthesia Plan: MAC and Spinal   Post-op Pain Management:    Induction: Intravenous  Airway Management Planned: Simple Face Mask and Natural Airway  Additional Equipment:   Intra-op Plan:   Post-operative Plan:   Informed Consent: I have reviewed the patients History and Physical, chart, labs and discussed the procedure including the risks, benefits and alternatives for the proposed anesthesia with the patient or authorized representative who has indicated his/her understanding and acceptance.   Dental advisory given  Plan Discussed with: CRNA and Anesthesiologist  Anesthesia Plan Comments:         Anesthesia Quick Evaluation

## 2015-05-01 LAB — CBC
HCT: 35.6 % — ABNORMAL LOW (ref 39.0–52.0)
HEMOGLOBIN: 12.1 g/dL — AB (ref 13.0–17.0)
MCH: 29 pg (ref 26.0–34.0)
MCHC: 34 g/dL (ref 30.0–36.0)
MCV: 85.4 fL (ref 78.0–100.0)
PLATELETS: 254 10*3/uL (ref 150–400)
RBC: 4.17 MIL/uL — ABNORMAL LOW (ref 4.22–5.81)
RDW: 13 % (ref 11.5–15.5)
WBC: 6.1 10*3/uL (ref 4.0–10.5)

## 2015-05-01 LAB — BASIC METABOLIC PANEL
ANION GAP: 7 (ref 5–15)
BUN: 14 mg/dL (ref 6–20)
CHLORIDE: 98 mmol/L — AB (ref 101–111)
CO2: 27 mmol/L (ref 22–32)
CREATININE: 1.05 mg/dL (ref 0.61–1.24)
Calcium: 9.2 mg/dL (ref 8.9–10.3)
GFR calc Af Amer: >60 mL/min (ref >60)
GLUCOSE: 138 mg/dL — AB (ref 65–99)
POTASSIUM: 4 mmol/L (ref 3.5–5.1)
Sodium: 132 mmol/L — ABNORMAL LOW (ref 135–145)

## 2015-05-01 NOTE — Progress Notes (Signed)
OT Cancellation Note  Patient Details Name: Dustin [name] MRN: [id number] DOB: [birth year]   Cancelled Treatment:    Reason Eval/Treat Not Completed: Other (comment) (OT screened) . Pt is Medicare and current D/C plan is SNF. No apparent immediate acute care OT needs, therefore will defer OT to SNF. If OT eval is needed please call Acute Rehab Dept. at [phone] or text page OT at [phone].    Benito Mccreedy OTR/L [phone] 05/01/2015, 10:59 AM

## 2015-05-01 NOTE — Progress Notes (Signed)
Physical Therapy Treatment Patient Details Name: Dustin [name] MRN: [id number] DOB: June 28, 1956 Today's Date: 05/01/2015    History of Present Illness R TKA     PT Comments    Patient progressing slowly towards PT goals. Continues to have increased pain limiting mobility. Improved ambulation distance today however became dizzy requiring need to sit and return to room. Instructed pt in exercises. Pt not safe to return home as he does not have any support. Recommend ST SNF to maximize independence and mobility prior to return home.   Follow Up Recommendations  SNF;Supervision/Assistance - 24 hour     Equipment Recommendations  Other (comment) (defer to next venue)    Recommendations for Other Services       Precautions / Restrictions Precautions Precautions: Knee Precaution Booklet Issued: No Precaution Comments: Reviewed no pillow under knee. Required Braces or Orthoses: Knee Immobilizer - Right Knee Immobilizer - Right: On at all times;On except when in CPM Restrictions Weight Bearing Restrictions: Yes RLE Weight Bearing: Weight bearing as tolerated    Mobility  Bed Mobility Overal bed mobility: Needs Assistance Bed Mobility: Supine to Sit     Supine to sit: Min assist;HOB elevated     General bed mobility comments: Min A to bring RLE to EOB. Use of rails. increased time due to pain.   Transfers Overall transfer level: Needs assistance Equipment used: Rolling walker (2 wheeled) Transfers: Sit to/from Stand Sit to Stand: Min assist         General transfer comment: Min A to boost up from EOB with cues for hand placment. Stood from [employer], from chair x1.   Ambulation/Gait Ambulation/Gait assistance: Min assist Ambulation Distance (Feet): 50 Feet Assistive device: Rolling walker (2 wheeled) Gait Pattern/deviations: Step-to pattern;Step-through pattern;Decreased stance time - right;Decreased step length - left;Trunk flexed;Antalgic;Decreased weight shift to  right   Gait velocity interpretation: Below normal speed for age/gender General Gait Details: Pt with slow, unsteady gait with MIn A for balance. + dizziness requiring need to sit and be rolled back to room. Cues to place weight through RLE.   Stairs            Wheelchair Mobility    Modified Rankin (Stroke Patients Only)       Balance Overall balance assessment: Needs assistance Sitting-balance support: Feet supported;No upper extremity supported Sitting balance-Leahy Scale: Fair Sitting balance - Comments: pt with left lateral trunk lean due to not wanting to flex right knee.   Standing balance support: During functional activity Standing balance-Leahy Scale: Poor Standing balance comment: Relient on RW for support.                     Cognition Arousal/Alertness: Awake/alert Behavior During Therapy: WFL for tasks assessed/performed Overall Cognitive Status: Within Functional Limits for tasks assessed                      Exercises Total Joint Exercises Ankle Circles/Pumps: Both;15 reps;Supine Quad Sets: Right;10 reps;Supine Heel Slides: Right;5 reps;Seated Goniometric ROM: 7-70 degrees knee AROM.    General Comments        Pertinent Vitals/Pain Pain Assessment: 0-10 Pain Score: 6  Pain Location: right knee Pain Descriptors / Indicators: Sore;Aching Pain Intervention(s): Monitored during session;Premedicated before session;Repositioned    Home Living                      Prior Function  PT Goals (current goals can now be found in the care plan section) Progress towards PT goals: Progressing toward goals    Frequency  7X/week    PT Plan Current plan remains appropriate    Co-evaluation             End of Session Equipment Utilized During Treatment: Gait belt;Right knee immobilizer Activity Tolerance: Patient limited by pain;Patient tolerated treatment well Patient left: in chair;with call bell/phone  within reach     Time: 1004-1030 PT Time Calculation (min) (ACUTE ONLY): 26 min  Charges:  $Gait Training: 8-22 mins $Therapeutic Exercise: 8-22 mins                    G Codes:      Curtez Brallier A Lazaro Isenhower 05/01/2015, 10:58 AM Wray Kearns, [place], DPT [phone]

## 2015-05-01 NOTE — Progress Notes (Signed)
   Subjective:  Patient reports pain as moderate.  No events.  Objective:   VITALS:   Filed Vitals:   04/30/15 1205 04/30/15 1949 05/01/15 0226 05/01/15 0606  BP: 132/83 143/73 127/64 125/75  Pulse: 65 97 87 85  Temp: 98.2 F (36.8 C) 99.4 F (37.4 C) 98.8 F (37.1 C) 98.7 F (37.1 C)  TempSrc: Oral Oral Oral Oral  Resp: 16 16 16 16   Height:      Weight:      SpO2: 100% 99% 98% 100%    Neurologically intact Neurovascular intact Sensation intact distally Intact pulses distally Dorsiflexion/Plantar flexion intact Incision: dressing C/D/I and no drainage No cellulitis present Compartment soft   Lab Results  Component Value Date   WBC 6.1 05/01/2015   HGB 12.1* 05/01/2015   HCT 35.6* 05/01/2015   MCV 85.4 05/01/2015   PLT 254 05/01/2015     Assessment/Plan:  1 Day Post-Op   - Expected postop acute blood loss anemia - will monitor for symptoms - Up with PT/OT - recs pending - DVT ppx - SCDs, ambulation, lovenox - WBAT right lower extremity - Pain control - Discharge planning  Dustin [name], Dustin [name] 05/01/2015, 9:58 AM [phone]

## 2015-05-01 NOTE — Op Note (Signed)
Dustin [name], Dustin [name]               ACCOUNT NO.:  [id number]  MEDICAL RECORD NO.:  [id number]  LOCATION:  5N08C                        FACILITY:  [facility]  PHYSICIAN:  Tristram Milian C. Lorin Mercy, M.D.    DATE OF BIRTH:  12/26/1955  DATE OF PROCEDURE:  04/30/2015 DATE OF DISCHARGE:                              OPERATIVE REPORT   PREOPERATIVE DIAGNOSIS:  Right knee primary osteoarthritis with varus flexion deformity.  POSTOPERATIVE DIAGNOSIS:  Right knee primary osteoarthritis with varus flexion deformity.  PROCEDURE:  Right cemented total knee arthroplasty LCS, computer assist.  SURGEON:  Tamyka Bezio C. Lorin Mercy, M.D.  ANESTHESIA:  Spinal.  ASSISTANT:  Alyson Locket. Ricard Dillon, PA-C, medically necessary and present for the entire procedure.  TOURNIQUET TIME:  An hour and 17 minutes.  DRAINS:  None.  ESTIMATED BLOOD LOSS:  Minimal.  IMPLANTS:  #5 femur with lugs cemented, #5 tibia cemented 10 mm spacer, 41 mm 3-pegged patella.  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  The leg was prepped with DuraPrep, usual split sheets were applied.  He was prepped from the tourniquet to the tips of his toes.  Extremity sheets, drapes, impervious stockinette, Coban, Betadine, and Steri-Drapes were applied after sterile skin marker for an anterior midline incision.  Time-out procedure was completed.  Incision was made, superficial retinaculum was divided.  Medial parapatellar incision was made.  Quad tendon split between the medial one-third and lateral two-thirds.  Computer pins were placed in the femur bicortical as well as in the tibia.  Stab incision on the tibia and computer models were made both for the tibia and the femur.  This showed 20-degree flexion contracture and 16-degree varus deformity consistent with preoperative x-rays.  He had erosive  changes medially, large osteophytes, large posterior medial osteophytes. Resection on the femur was 11 mm.  11 was taken off the tibia.  Box cuts were made, Chamfer cuts.  There were 2 cm osteophytes off the posterior medial femur, which was removed.  Spur was removed on the tibia and the deep fibers of the MCL were released for ligamentous balance knowing the degree of deformity.  All the PCL was resected.  Trials were inserted, medial side was still tight.  All PCL had been resected.  Continued release of the deep fibers until the knee would come to 1 to 2 degrees of full extension with pressure, it would hyperextend 3 degrees and then letting go out pop back to 1 degree flexion contracture, but it did allow full extension.  The patient had tight hamstrings as expected from long-term flexion deformity of the knee.  All cuts were less than 0.5 degree off and there was no varus or valgus, it was exactly spot on. Rotation looked good 41 mm patella, which was along the medial aspect of the patella.  No thumbs technique with the trials as well was once the permanents were cemented in.  All excessive bone had been removed.  All spurs, pulsatile lavage, vacuum mixing.  Cementing of the tibia followed by the femur, insertion of the  10 mm permanent spacer and then 41 mm poly, DePuy.  Cement was hardened at 15 minutes, tourniquet deflated, hemostasis, then standard layered closure, #1 in the quad tendon medial retinaculum from the medial parapatellar incision.  Superficial retinaculum closed with 2-0 on the subcutaneous tissue, subcuticular skin closure, postop dressing, tincture of benzoin, Steri-Strips, postop dressing, knee immobilizer.  The patient tolerated the procedure well, was transferred to the recovery room in stable condition.     Agron Swiney C. Lorin Mercy, M.D.     MCY/MEDQ  D:  04/30/2015  T:  05/01/2015  Job:  [id number]

## 2015-05-02 LAB — CBC
HCT: 32.8 % — ABNORMAL LOW (ref 39.0–52.0)
Hemoglobin: 11.4 g/dL — ABNORMAL LOW (ref 13.0–17.0)
MCH: 29.8 pg (ref 26.0–34.0)
MCHC: 34.8 g/dL (ref 30.0–36.0)
MCV: 85.6 fL (ref 78.0–100.0)
PLATELETS: 203 10*3/uL (ref 150–400)
RBC: 3.83 MIL/uL — ABNORMAL LOW (ref 4.22–5.81)
RDW: 13.2 % (ref 11.5–15.5)
WBC: 10.6 10*3/uL — ABNORMAL HIGH (ref 4.0–10.5)

## 2015-05-02 NOTE — Progress Notes (Signed)
Physical Therapy Treatment Patient Details Name: Dustin [name] MRN: [id number] DOB: March 30, 1956 Today's Date: 05/02/2015    History of Present Illness R TKA     PT Comments    Pt just medicated on arrival, pain control not yet optimal but pt willing to participate.  Remains limited in functional mobility, slowly improving toward goals. SNF still recommended.    Follow Up Recommendations  SNF;Supervision/Assistance - 24 hour     Equipment Recommendations  Rolling walker with 5" wheels;3in1 (PT)    Recommendations for Other Services       Precautions / Restrictions Precautions Precautions: Knee Precaution Booklet Issued: No Precaution Comments: educated on CPM use/safety Required Braces or Orthoses: Knee Immobilizer - Right Knee Immobilizer - Right: On at all times;On except when in CPM Restrictions Weight Bearing Restrictions: Yes RLE Weight Bearing: Weight bearing as tolerated Other Position/Activity Restrictions: no pillow under operated knee    Mobility  Bed Mobility Overal bed mobility: Needs Assistance Bed Mobility: Supine to Sit;Sit to Supine     Supine to sit: Mod assist;HOB elevated Sit to supine: Mod assist   General bed mobility comments: physical assist to move to left EOB using bed pad, verbal cues, tactile cue to assist leg and guide technique/  pt preferentially sits to left hip  Transfers Overall transfer level: Needs assistance Equipment used: Rolling walker (2 wheeled) Transfers: Sit to/from Stand Sit to Stand: Min assist;From elevated surface         General transfer comment: minimal physical assist plus tactile and demonstration cues for hand placement and technique, pt able to control ascent and descent  Ambulation/Gait Ambulation/Gait assistance: Min guard;Supervision Ambulation Distance (Feet): 50 Feet Assistive device: Rolling walker (2 wheeled) Gait Pattern/deviations: Step-to pattern;Trunk flexed;Narrow base of support   Gait  velocity interpretation: Below normal speed for age/gender General Gait Details: denies dizziness until end when mild symptom reported; limited by pain in knee and difficulty with unloaded kneed flex/extend in standing, no heel strike, heavily dependent on RW for support, endurance impaired   Stairs            Wheelchair Mobility    Modified Rankin (Stroke Patients Only)       Balance Overall balance assessment:  (needs walker due to wb limitations, no balance issues noted)                                  Cognition Arousal/Alertness: Awake/alert Behavior During Therapy: WFL for tasks assessed/performed Overall Cognitive Status: Within Functional Limits for tasks assessed                      Exercises Total Joint Exercises Ankle Circles/Pumps: AROM;AAROM;Right;10 reps;Supine Quad Sets: AROM;Right;10 reps;Supine    General Comments General comments (skin integrity, edema, etc.): right (operated) knee swollen and painful, limited ROM and WB, applied ice and CPM 0-48 degrees to pt tolerance.      Pertinent Vitals/Pain Pain Assessment: Faces Faces Pain Scale: Hurts whole lot Pain Location: right knee Pain Intervention(s): Limited activity within patient's tolerance;Monitored during session;Premedicated before session;Repositioned;Ice applied    Home Living                      Prior Function            PT Goals (current goals can now be found in the care plan section) Acute Rehab PT Goals Patient Stated Goal: Get  going again Progress towards PT goals: Progressing toward goals    Frequency  7X/week    PT Plan Current plan remains appropriate    Co-evaluation             End of Session Equipment Utilized During Treatment: Gait belt;Right knee immobilizer Activity Tolerance: Patient limited by pain;Patient tolerated treatment well Patient left: in bed;in CPM;with call bell/phone within reach     Time: 1320-1352 PT  Time Calculation (min) (ACUTE ONLY): 32 min  Charges:  $Gait Training: 8-22 mins $Therapeutic Activity: 8-22 mins                    G Codes:      Herbie Drape 05/02/2015, 2:15 PM

## 2015-05-02 NOTE — Progress Notes (Signed)
Patient has refused to eat most food presented on his tray today - eating only 10-15%. Has denied nausea, states he has no appetite d/t pain. Explained importance nutrition/ eating - agreed to order mashed potatoes. This RN restarted continuous IV of 1/2 NS w/ 20 KCL @ 90 cc/ hr [vs KVO]. Was eating mashed potatoes with butter, stated, "tastes pretty good. Thank you." Currently pain now "4" following intervention.

## 2015-05-02 NOTE — Progress Notes (Signed)
   Subjective:  Patient reports pain as moderate.  No events.  Objective:   VITALS:   Filed Vitals:   05/01/15 0606 05/01/15 1518 05/01/15 1957 05/02/15 0458  BP: 125/75 159/80 133/77 126/77  Pulse: 85 102 110 114  Temp: 98.7 F (37.1 C) 99.9 F (37.7 C) 101.7 F (38.7 C) 98.1 F (36.7 C)  TempSrc: Oral Oral Oral Oral  Resp: 16 18 18 18   Height:      Weight:      SpO2: 100% 92% 97% 100%    Incision c/d/i   Lab Results  Component Value Date   WBC 10.6* 05/02/2015   HGB 11.4* 05/02/2015   HCT 32.8* 05/02/2015   MCV 85.6 05/02/2015   PLT 203 05/02/2015     Assessment/Plan:  2 Days Post-Op   - Expected postop acute blood loss anemia - will monitor for symptoms - Up with PT/OT - SNF - dressing changed - SNF Tuesday  Effie, Janoski 05/02/2015, 10:36 AM [phone]

## 2015-05-03 LAB — CBC
HEMATOCRIT: 28.9 % — AB (ref 39.0–52.0)
Hemoglobin: 9.8 g/dL — ABNORMAL LOW (ref 13.0–17.0)
MCH: 28.8 pg (ref 26.0–34.0)
MCHC: 33.9 g/dL (ref 30.0–36.0)
MCV: 85 fL (ref 78.0–100.0)
PLATELETS: 206 10*3/uL (ref 150–400)
RBC: 3.4 MIL/uL — ABNORMAL LOW (ref 4.22–5.81)
RDW: 13.1 % (ref 11.5–15.5)
WBC: 10.1 10*3/uL (ref 4.0–10.5)

## 2015-05-03 NOTE — Progress Notes (Signed)
Physical Therapy Treatment Patient Details Name: NIQUAN CHARNLEY MRN: [id number] DOB: 25-Dec-1955 Today's Date: 05/03/2015    History of Present Illness R TKA     PT Comments    Patient is making gradual progress with PT intervention. Heavy encouragement for mobility progression. From a mobility standpoint anticipate patient will be ready for DC to SNF .     Follow Up Recommendations  SNF     Equipment Recommendations  Rolling walker with 5" wheels;3in1 (PT)    Recommendations for Other Services       Precautions / Restrictions Restrictions Weight Bearing Restrictions: Yes RLE Weight Bearing: Weight bearing as tolerated    Mobility  Bed Mobility Overal bed mobility: Needs Assistance Bed Mobility: Supine to Sit     Supine to sit: Supervision;HOB elevated     General bed mobility comments: encouragement to perform mobility with less assistance.  Transfers Overall transfer level: Needs assistance Equipment used: Rolling walker (2 wheeled)   Sit to Stand: Min assist;From elevated surface            Ambulation/Gait Ambulation/Gait assistance: Min guard Ambulation Distance (Feet): 55 Feet Assistive device: Rolling walker (2 wheeled) Gait Pattern/deviations: Step-to pattern   Gait velocity interpretation: Below normal speed for age/gender     Stairs            Wheelchair Mobility    Modified Rankin (Stroke Patients Only)       Balance     Sitting balance-Leahy Scale: Fair       Standing balance-Leahy Scale: Poor                      Cognition Arousal/Alertness: Awake/alert Behavior During Therapy: WFL for tasks assessed/performed Overall Cognitive Status: Within Functional Limits for tasks assessed                      Exercises      General Comments        Pertinent Vitals/Pain Pain Assessment: 0-10 Pain Score: 7  Pain Location: Rt knee Pain Descriptors / Indicators: Sore Pain Intervention(s): Monitored  during session    Home Living                      Prior Function            PT Goals (current goals can now be found in the care plan section) Acute Rehab PT Goals PT Goal Formulation: With patient Time For Goal Achievement: 05/14/15 Potential to Achieve Goals: Good    Frequency  7X/week    PT Plan Current plan remains appropriate    Co-evaluation             End of Session Equipment Utilized During Treatment: Gait belt Activity Tolerance: Patient limited by fatigue;No increased pain Patient left: in bed;with call bell/phone within reach;with SCD's reapplied (declined reapplication of CPM, off 1400)     Time: 6144-3154 PT Time Calculation (min) (ACUTE ONLY): 15 min  Charges:  $Gait Training: 8-22 mins                    G Codes:      Cassell Clement, PT, CSCS Pager [phone] Office [phone]  05/03/2015, 4:18 PM

## 2015-05-03 NOTE — Progress Notes (Signed)
Physical Therapy Treatment Patient Details Name: Dustin [name] MRN: [id number] DOB: 07-05-1956 Today's Date: 05/03/2015    History of Present Illness R TKA     PT Comments    Pt is s/p TKA resulting in the deficits listed below (see PT Problem List). Pt will benefit from skilled PT to increase their independence and safety with mobility to allow discharge to SNF as patient lives alone without external support. Patient remains agreeable to this plan.     Follow Up Recommendations  SNF     Equipment Recommendations  Rolling walker with 5" wheels;3in1 (PT)    Recommendations for Other Services       Precautions / Restrictions Precautions Precautions: Knee Precaution Booklet Issued: No Precaution Comments: reviewed need to for knee extension stretch during the day.  Restrictions Weight Bearing Restrictions: Yes RLE Weight Bearing: Weight bearing as tolerated    Mobility  Bed Mobility Overal bed mobility: Needs Assistance Bed Mobility: Supine to Sit     Supine to sit: Supervision;HOB elevated     General bed mobility comments: encouragement to perform mobility with less assistance.  Transfers Overall transfer level: Needs assistance Equipment used: Rolling walker (2 wheeled) Transfers: Sit to/from Stand Sit to Stand: Min assist;From elevated surface            Ambulation/Gait Ambulation/Gait assistance: Min guard Ambulation Distance (Feet): 50 Feet Assistive device: Rolling walker (2 wheeled) Gait Pattern/deviations: Step-to pattern   Gait velocity interpretation: Below normal speed for age/gender     Stairs            Wheelchair Mobility    Modified Rankin (Stroke Patients Only)       Balance Overall balance assessment: Needs assistance Sitting-balance support: Feet supported;No upper extremity supported Sitting balance-Leahy Scale: Fair     Standing balance support: Bilateral upper extremity supported Standing balance-Leahy Scale:  Poor Standing balance comment: using rw for supports                    Cognition Arousal/Alertness: Awake/alert Behavior During Therapy: WFL for tasks assessed/performed Overall Cognitive Status: Within Functional Limits for tasks assessed                      Exercises Total Joint Exercises Ankle Circles/Pumps: AROM;AAROM;Right;10 reps;Supine Quad Sets: Strengthening;Right;10 reps;Seated (recliner) Heel Slides: AAROM;10 reps;Seated Goniometric ROM: 9-76    General Comments        Pertinent Vitals/Pain Pain Assessment: 0-10 Pain Score: 8  Pain Location: Rt knee Pain Intervention(s): Limited activity within patient's tolerance;Repositioned    Home Living                      Prior Function            PT Goals (current goals can now be found in the care plan section) Acute Rehab PT Goals PT Goal Formulation: With patient Time For Goal Achievement: 05/14/15 Potential to Achieve Goals: Good Progress towards PT goals: Progressing toward goals    Frequency  7X/week    PT Plan Current plan remains appropriate    Co-evaluation             End of Session Equipment Utilized During Treatment: Gait belt Activity Tolerance: Patient limited by fatigue;Patient limited by pain Patient left: in chair;with call bell/phone within reach     Time: 1029-1052 PT Time Calculation (min) (ACUTE ONLY): 23 min  Charges:  $Gait Training: 8-22 mins $Therapeutic Exercise: 8-22 mins  G Codes:      Cassell Clement, PT, CSCS Pager [phone] Office [phone]  05/03/2015, 12:13 PM

## 2015-05-03 NOTE — Clinical Social Work Note (Signed)
Clinical Social Work Assessment  Patient Details  Name: Dustin [name] MRN: [id number] Date of Birth: 1955/12/15  Date of referral:  05/03/15               Reason for consult:  Facility Placement                Permission sought to share information with:  Chartered certified accountant granted to share information::  Yes, Verbal Permission Granted  Name::        Agency::   [facility] SNFs)  Relationship::     Contact Information:     Housing/Transportation Living arrangements for the past 2 months:  [place] of Information:  Patient Patient Interpreter Needed:  None Criminal Activity/Legal Involvement Pertinent to Current Situation/Hospitalization:    Significant Relationships:    Lives with:  Significant Other Do you feel safe going back to the place where you live?  No (fall risk) Need for family participation in patient care:     Care giving concerns:  No caregiver present during assessment.   Social Worker assessment / plan:  CSW met with patient to review disposition.  Patient aware of PT recommendation and agreeable to SNF placement.  Patient is from home with fiance.  Patient is not familiar with SNF facilities.  CSW provided list of facilities and wil;l provide bed offers once available.  Patient is aware and agreeable.  Employment status:  Disabled (Comment on whether or not currently receiving Disability) Insurance information:  Medicare, Medicaid In [place] PT Recommendations:  [place] / Referral to community resources:  [place]  Patient/Family's Response to care:  Patient is agreeable to SNF placement as STR is recommended by PT  Patient/Family's Understanding of and Emotional Response to Diagnosis, Current Treatment, and Prognosis:  Patient is realistic regarding his need for STR/SNF at time of discharge.  Emotional Assessment Appearance:  Appears stated  age Attitude/Demeanor/Rapport:    Affect (typically observed):  Accepting, Adaptable Orientation:  Oriented to Self, Oriented to Place, Oriented to Situation, Oriented to  Time Alcohol / Substance use:  Not Applicable Psych involvement (Current and /or in the community):  No (Comment)  Discharge Needs  Concerns to be addressed:  No discharge needs identified Readmission within the last 30 days:    Current discharge risk:  None Barriers to Discharge:  No Barriers Identified   Dulcy Fanny, LCSW 05/03/2015, 11:37 AM

## 2015-05-03 NOTE — Clinical Social Work Placement (Signed)
   CLINICAL SOCIAL WORK PLACEMENT  NOTE  Date:  05/03/2015  Patient Details  Name: Dustin [name] MRN: [id number] Date of Birth: August 26, 1956  Clinical Social Work is seeking post-discharge placement for this patient at the [place] level of care (*CSW will initial, date and re-position this form in  chart as items are completed):  Yes   Patient/family provided with [place] Work Department's list of facilities offering this level of care within the geographic area requested by the patient (or if unable, by the patient's family).  Yes   Patient/family informed of their freedom to choose among providers that offer the needed level of care, that participate in Medicare, Medicaid or managed care program needed by the patient, have an available bed and are willing to accept the patient.  Yes   Patient/family informed of 's ownership interest in [facility] and [facility], as well as of the fact that they are under no obligation to receive care at these facilities.  PASRR submitted to EDS on 05/03/15     PASRR number received on 05/03/15     Existing PASRR number confirmed on       FL2 transmitted to all facilities in geographic area requested by pt/family on 05/03/15     FL2 transmitted to all facilities within larger geographic area on       Patient informed that his/her managed care company has contracts with or will negotiate with certain facilities, including the following:            Patient/family informed of bed offers received.  Patient chooses bed at       Physician recommends and patient chooses bed at      Patient to be transferred to   on  .  Patient to be transferred to facility by       Patient family notified on   of transfer.  Name of family member notified:        PHYSICIAN       Additional Comment:    _______________________________________________ Dulcy Fanny, LCSW 05/03/2015, 11:39 AM

## 2015-05-03 NOTE — Progress Notes (Signed)
Orthopedic Tech Progress Note Patient Details:  Dustin [name] [birth year] [id number] On cpm at 7:10 pm Patient ID: Dustin [name], male   DOB: 11-21-1955, 59 y.o.   MRN: [id number]   Braulio Bosch 05/03/2015, 7:13 PM

## 2015-05-03 NOTE — Progress Notes (Signed)
   Subjective:  Patient reports pain as moderate.  No events.  Objective:   VITALS:   Filed Vitals:   05/02/15 0458 05/02/15 1615 05/02/15 2001 05/03/15 0501  BP: 126/77 126/77 117/75 110/73  Pulse: 114 114 114 125  Temp: 98.1 F (36.7 C) 100.2 F (37.9 C) 98.8 F (37.1 C) 98.9 F (37.2 C)  TempSrc: Oral Oral Oral Oral  Resp: 18 18 18 18   Height:      Weight:      SpO2: 100% 98% 98% 100%    Dressing c/d/i   Lab Results  Component Value Date   WBC 10.1 05/03/2015   HGB 9.8* 05/03/2015   HCT 28.9* 05/03/2015   MCV 85.0 05/03/2015   PLT 206 05/03/2015     Assessment/Plan:  3 Days Post-Op   - Expected postop acute blood loss anemia - will monitor for symptoms - Up with PT/OT - SNF - SNF Tuesday  Nate, Common 05/03/2015, 6:22 AM [phone]

## 2015-05-03 NOTE — Care Management (Signed)
Important Message  Patient Details  Name: Dustin [name] MRN: [id number] Date of Birth: 07-25-1956   Medicare Important Message Given:  Yes-second notification given    Loann Quill 05/03/2015, 9:58 AM

## 2015-05-04 ENCOUNTER — Encounter (HOSPITAL_COMMUNITY): Payer: Self-pay | Admitting: Orthopaedic Surgery

## 2015-05-04 DIAGNOSIS — M19042 Primary osteoarthritis, left hand: Secondary | ICD-10-CM | POA: Diagnosis not present

## 2015-05-04 DIAGNOSIS — M7989 Other specified soft tissue disorders: Secondary | ICD-10-CM | POA: Diagnosis not present

## 2015-05-04 DIAGNOSIS — Z86718 Personal history of other venous thrombosis and embolism: Secondary | ICD-10-CM | POA: Diagnosis not present

## 2015-05-04 DIAGNOSIS — Z471 Aftercare following joint replacement surgery: Secondary | ICD-10-CM | POA: Diagnosis not present

## 2015-05-04 DIAGNOSIS — I10 Essential (primary) hypertension: Secondary | ICD-10-CM | POA: Diagnosis not present

## 2015-05-04 DIAGNOSIS — T8484XD Pain due to internal orthopedic prosthetic devices, implants and grafts, subsequent encounter: Secondary | ICD-10-CM | POA: Diagnosis not present

## 2015-05-04 DIAGNOSIS — I517 Cardiomegaly: Secondary | ICD-10-CM | POA: Diagnosis not present

## 2015-05-04 DIAGNOSIS — Z7982 Long term (current) use of aspirin: Secondary | ICD-10-CM | POA: Diagnosis not present

## 2015-05-04 DIAGNOSIS — Z96651 Presence of right artificial knee joint: Secondary | ICD-10-CM | POA: Diagnosis not present

## 2015-05-04 DIAGNOSIS — M25561 Pain in right knee: Secondary | ICD-10-CM | POA: Diagnosis not present

## 2015-05-04 DIAGNOSIS — R2681 Unsteadiness on feet: Secondary | ICD-10-CM | POA: Diagnosis not present

## 2015-05-04 DIAGNOSIS — D62 Acute posthemorrhagic anemia: Secondary | ICD-10-CM | POA: Diagnosis not present

## 2015-05-04 DIAGNOSIS — M1712 Unilateral primary osteoarthritis, left knee: Secondary | ICD-10-CM | POA: Diagnosis not present

## 2015-05-04 DIAGNOSIS — M6281 Muscle weakness (generalized): Secondary | ICD-10-CM | POA: Diagnosis not present

## 2015-05-04 NOTE — Discharge Planning (Addendum)
Patient will discharge today per MD order. Patient will discharge to [facility] (private room confirmed) RN to call report prior to transportation to: [phone] Transportation: [facility]- to be scheduled at 2:30pm   CSW sent discharge summary to SNF for review.  Packet is complete.  RN and patient aware of discharge plans.  Nonnie Done, [place] [phone]  Psychiatric & Orthopedics (5N 1-16) Clinical Social Worker

## 2015-05-04 NOTE — Care Management Note (Signed)
Case Management Note  Patient Details  Name: Dustin [name] MRN: [id number] Date of Birth: Nov 04, 1955  Subjective/Objective:    59 yr old male admitted with osteoarthritis of his right knee. Patient underwent a right total knee arthroplasty.                Action/Plan: Patient is for shortterm rehab at [facility].. Will go to [place].    Expected Discharge Date: 05/04/15                 Expected Discharge Plan: [place]     In-House Referral:     Discharge planning Services     Post Acute Care Choice:    Choice offered to:     DME Arranged: NA    DME Agency:     HH Arranged:   NA HH Agency:     Status of Service: Completed.    Medicare Important Message Given:  Yes-second notification given Date Medicare IM Given:    Medicare IM give by:    Date Additional Medicare IM Given:    Additional Medicare Important Message give by:     If discussed at [place] of Stay Meetings, dates discussed:    Additional Comments:  Ninfa Meeker, RN 05/04/2015, 11:17 AM

## 2015-05-04 NOTE — Discharge Summary (Signed)
Patient ID: Dustin [name] MRN: [id number] DOB/AGE: [birth year] 59 y.o.  Admit date: 04/30/2015 Discharge date: 05/04/2015  Admission Diagnoses:  Active Problems:   Status post total right knee replacement   Discharge Diagnoses:  Active Problems:   Status post total right knee replacement  status post Procedure(s): COMPUTER ASSISTED TOTAL KNEE ARTHROPLASTY  Past Medical History  Diagnosis Date  . History of blood clots     to left arm  . Arthritis     bil knees, left hand  . DVT of upper extremity (deep vein thrombosis)     left brachial vein, 12/2010  . Hypertension   . Shortness of breath dyspnea     with exertion    Surgeries: Procedure(s): COMPUTER ASSISTED TOTAL KNEE ARTHROPLASTY on 04/30/2015   Consultants:    Discharged Condition: Improved  Hospital Course: Dustin [name] is an 59 y.o. male who was admitted 04/30/2015 for operative treatment of end stage djd right knee. Patient failed conservative treatments (please see the history and physical for the specifics) and had severe unremitting pain that affects sleep, daily activities and work/hobbies. After pre-op clearance, the patient was taken to the operating room on 04/30/2015 and underwent  Procedure(s): COMPUTER ASSISTED TOTAL KNEE ARTHROPLASTY.    Patient was given perioperative antibiotics: Anti-infectives    Start     Dose/Rate Route Frequency Ordered Stop   04/30/15 0645  ceFAZolin (ANCEF) IVPB 2 g/50 mL premix     2 g 100 mL/hr over 30 Minutes Intravenous To ShortStay Surgical 04/29/15 1255 04/30/15 0753       Patient was given sequential compression devices and early ambulation to prevent DVT.   Patient benefited maximally from hospital stay and there were no complications. At the time of discharge, the patient was urinating/moving their bowels without difficulty, tolerating a regular diet, pain is controlled with oral pain medications and they have been cleared by PT/OT.   Recent vital signs: Patient  Vitals for the past 24 hrs:  BP Temp Pulse Resp SpO2  05/04/15 0547 128/73 mmHg 98.3 F (36.8 C) 97 16 100 %  05/03/15 2027 118/64 mmHg 98.8 F (37.1 C) 95 16 100 %  05/03/15 1300 120/65 mmHg 100.2 F (37.9 C) 98 18 98 %     Recent laboratory studies:  Recent Labs  05/02/15 0710 05/03/15 0405  WBC 10.6* 10.1  HGB 11.4* 9.8*  HCT 32.8* 28.9*  PLT 203 206     Discharge Medications:     Medication List    STOP taking these medications        doxycycline 100 MG tablet  Commonly known as:  VIBRA-TABS     etodolac 400 MG tablet  Commonly known as:  LODINE     HYDROcodone-acetaminophen 10-325 MG per tablet  Commonly known as:  NORCO     ibuprofen 600 MG tablet  Commonly known as:  ADVIL,MOTRIN      TAKE these medications        amLODipine 5 MG tablet  Commonly known as:  NORVASC  Take 1 tablet (5 mg total) by mouth daily.     aspirin EC 325 MG tablet  Take 1 tablet (325 mg total) by mouth daily.     methocarbamol 500 MG tablet  Commonly known as:  ROBAXIN  Take 1 tablet (500 mg total) by mouth 4 (four) times daily.     oxyCODONE-acetaminophen 7.5-325 MG per tablet  Commonly known as:  PERCOCET  Take 1 tablet by mouth every  4 (four) hours as needed for severe pain.        Diagnostic Studies: Dg Knee Right Port  04/30/2015   CLINICAL DATA:  Postop right knee replacement.  EXAM: PORTABLE RIGHT KNEE - 1-2 VIEW  COMPARISON:  December 30, 2014.  FINDINGS: The femoral and tibial components appear to be well situated. No fracture or dislocation is noted. Postoperative gas and fluid is noted.  IMPRESSION: Status post right total knee arthroplasty.   Electronically Signed   By: Marijo Conception, M.D.   On: 04/30/2015 10:53          Follow-up Information    Schedule an appointment as soon as possible for a visit with Marybelle Killings, MD.   Specialty:  Orthopedic Surgery   Why:  need return office visit 2 weeks postop   Contact information:   [place] [id number] [phone]       Discharge Plan:  discharge to snf      Signed: Lanae [name]  For Dustin [place] C. Lorin Mercy MD [facility] orthopedics [phone] 05/04/2015, 8:41 AM

## 2015-05-04 NOTE — Progress Notes (Signed)
Tried to call report to nurse at [facility], but nurse never answered.

## 2015-05-04 NOTE — Progress Notes (Signed)
Physical Therapy Treatment Patient Details Name: Dustin [name] MRN: [id number] DOB: 1955-12-18 Today's Date: 05/04/2015    History of Present Illness R TKA     PT Comments    Patient reporting that he is about to leave for the SNF and does not want to do any therapy right now.   Follow Up Recommendations  SNF     Equipment Recommendations  Rolling walker with 5" wheels;3in1 (PT)    Recommendations for Other Services       Precautions / Restrictions Precautions Precautions: Knee Precaution Booklet Issued: No Required Braces or Orthoses: Knee Immobilizer - Right Restrictions Weight Bearing Restrictions: Yes RLE Weight Bearing: Weight bearing as tolerated     PT Goals (current goals can now be found in the care plan section) Acute Rehab PT Goals Patient Stated Goal: Get going again PT Goal Formulation: With patient Time For Goal Achievement: 05/14/15 Potential to Achieve Goals: Good Progress towards PT goals: Progressing toward goals    Frequency  7X/week    PT Plan Current plan remains appropriate    Co-evaluation         Time: 1320-1325 PT Time Calculation (min) (ACUTE ONLY): 5 min  Charges:  $Gait Training: 8-22 mins $Therapeutic Exercise: 8-22 mins                    G Codes:      Cassell Clement, PT, CSCS Pager [phone] Office [phone]  05/04/2015, 1:27 PM

## 2015-05-04 NOTE — Clinical Social Work Placement (Signed)
   CLINICAL SOCIAL WORK PLACEMENT  NOTE  Date:  05/04/2015  Patient Details  Name: Dustin [name] MRN: [id number] Date of Birth: 04-19-1956  Clinical Social Work is seeking post-discharge placement for this patient at the [place] level of care (*CSW will initial, date and re-position this form in  chart as items are completed):  Yes   Patient/family provided with [facility] Work Department's list of facilities offering this level of care within the geographic area requested by the patient (or if unable, by the patient's family).  Yes   Patient/family informed of their freedom to choose among providers that offer the needed level of care, that participate in Medicare, Medicaid or managed care program needed by the patient, have an available bed and are willing to accept the patient.  Yes   Patient/family informed of [facility]'s ownership interest in [facility] and [facility], as well as of the fact that they are under no obligation to receive care at these facilities.  PASRR submitted to EDS on 05/03/15     PASRR number received on 05/03/15     Existing PASRR number confirmed on       FL2 transmitted to all facilities in geographic area requested by pt/family on 05/03/15     FL2 transmitted to all facilities within larger geographic area on       Patient informed that his/her managed care company has contracts with or will negotiate with certain facilities, including the following:        Yes   Patient/family informed of bed offers received.  Patient chooses bed at [facility] recommends and patient chooses bed at  (none)    Patient to be transferred to [facility] on 05/04/15.  Patient to be transferred to facility by [facility]     Patient family notified on 05/04/15 of transfer.  Name of family member notified:  patient is alert and oriented x4 no family at bedside at time of discharge  review     PHYSICIAN Please prepare priority discharge summary, including medications     Additional Comment:    _______________________________________________ Dulcy Fanny, LCSW 05/04/2015, 10:22 AM

## 2015-05-04 NOTE — Progress Notes (Signed)
Physical Therapy Treatment Patient Details Name: Dustin [name] MRN: [id number] DOB: 24-Nov-1955 Today's Date: 05/04/2015    History of Present Illness R TKA     PT Comments    Patient is making good progress with PT.  From a mobility standpoint anticipate patient will be ready for DC to SNF.     Follow Up Recommendations  SNF     Equipment Recommendations  Rolling walker with 5" wheels;3in1 (PT)    Recommendations for Other Services       Precautions / Restrictions Precautions Precautions: Knee Precaution Booklet Issued: No Required Braces or Orthoses: Knee Immobilizer - Right Restrictions Weight Bearing Restrictions: Yes RLE Weight Bearing: Weight bearing as tolerated    Mobility  Bed Mobility Overal bed mobility: Needs Assistance Bed Mobility: Supine to Sit     Supine to sit: Supervision;HOB elevated        Transfers Overall transfer level: Needs assistance Equipment used: Rolling walker (2 wheeled) Transfers: Sit to/from Stand Sit to Stand: Min assist            Ambulation/Gait Ambulation/Gait assistance: Min guard Ambulation Distance (Feet): 70 Feet Assistive device: Rolling walker (2 wheeled) Gait Pattern/deviations: Step-to pattern   Gait velocity interpretation: Below normal speed for age/gender     Stairs            Wheelchair Mobility    Modified Rankin (Stroke Patients Only)       Balance Overall balance assessment: Needs assistance Sitting-balance support: Feet supported Sitting balance-Leahy Scale: Fair       Standing balance-Leahy Scale: Poor                      Cognition Arousal/Alertness: Awake/alert Behavior During Therapy: WFL for tasks assessed/performed Overall Cognitive Status: Within Functional Limits for tasks assessed                      Exercises Total Joint Exercises Quad Sets: Strengthening;Right;10 reps;Seated (recliner) Heel Slides: AAROM;10 reps;Seated Goniometric ROM:  9-64    General Comments        Pertinent Vitals/Pain Pain Assessment: 0-10 Pain Score: 5  Pain Location: Rt knee Pain Descriptors / Indicators: Sore Pain Intervention(s): Monitored during session    Home Living                      Prior Function            PT Goals (current goals can now be found in the care plan section) Acute Rehab PT Goals Patient Stated Goal: Get going again PT Goal Formulation: With patient Time For Goal Achievement: 05/14/15 Potential to Achieve Goals: Good Progress towards PT goals: Progressing toward goals    Frequency  7X/week    PT Plan Current plan remains appropriate    Co-evaluation             End of Session Equipment Utilized During Treatment: Gait belt;Right knee immobilizer Activity Tolerance: Patient limited by fatigue;No increased pain Patient left: in chair;with call bell/phone within reach     Time: 0820-0846 PT Time Calculation (min) (ACUTE ONLY): 26 min  Charges:  $Gait Training: 8-22 mins $Therapeutic Exercise: 8-22 mins                    G Codes:      Cassell Clement, PT, CSCS Pager [phone] Office [phone]  05/04/2015, 12:31 PM

## 2015-05-04 NOTE — Progress Notes (Signed)
Subjective: Doing well.  Pain controlled.  Ready for transfer to SNF.    Objective: Vital signs in last 24 hours: Temp:  [98.3 F (36.8 C)-100.2 F (37.9 C)] 98.3 F (36.8 C) (07/05 0547) Pulse Rate:  [95-98] 97 (07/05 0547) Resp:  [16-18] 16 (07/05 0547) BP: (118-128)/(64-73) 128/73 mmHg (07/05 0547) SpO2:  [98 %-100 %] 100 % (07/05 0547)  Intake/Output from previous day: 07/04 0701 - 07/05 0700 In: 840 [P.O.:840] Out: 950 [Urine:950] Intake/Output this shift:     Recent Labs  05/02/15 0710 05/03/15 0405  HGB 11.4* 9.8*    Recent Labs  05/02/15 0710 05/03/15 0405  WBC 10.6* 10.1  RBC 3.83* 3.40*  HCT 32.8* 28.9*  PLT 203 206   No results for input(s): NA, K, CL, CO2, BUN, CREATININE, GLUCOSE, CALCIUM in the last 72 hours. No results for input(s): LABPT, INR in the last 72 hours. Exam:  Wound looks good.  steris intact.  No drainage or signs of infection.  Calf nontender, NVI.  Alert and oriented.     Assessment/Plan: Transfer to snf today.  F/u with Dr Lorin Mercy 2 weeks postop.    Dustin [name],Dustin [name] 05/04/2015, 11:53 AM

## 2015-05-06 ENCOUNTER — Non-Acute Institutional Stay (SKILLED_NURSING_FACILITY): Payer: Medicare Other | Admitting: Internal Medicine

## 2015-05-06 ENCOUNTER — Encounter: Payer: Self-pay | Admitting: Internal Medicine

## 2015-05-06 DIAGNOSIS — Z96651 Presence of right artificial knee joint: Secondary | ICD-10-CM | POA: Diagnosis not present

## 2015-05-06 DIAGNOSIS — I10 Essential (primary) hypertension: Secondary | ICD-10-CM | POA: Diagnosis not present

## 2015-05-06 DIAGNOSIS — Z86718 Personal history of other venous thrombosis and embolism: Secondary | ICD-10-CM | POA: Diagnosis not present

## 2015-05-06 DIAGNOSIS — D62 Acute posthemorrhagic anemia: Secondary | ICD-10-CM | POA: Diagnosis not present

## 2015-05-06 NOTE — Assessment & Plan Note (Addendum)
On amlodipine 5 mg;inc to 10 mg for better ; will actively titrate to desired.

## 2015-05-06 NOTE — Assessment & Plan Note (Addendum)
PHR -Hb 13.8 to 9.8; no mention of transfusion; I will order CBC to follow

## 2015-05-06 NOTE — Progress Notes (Signed)
MRN: [id number] Name: Dustin [name]  Sex: male Age: 59 y.o. DOB: 1956-08-02  [place] #: Helene Kelp Facility/Room:308 Level Of Care: SNF Provider: Inocencio Homes D Emergency Contacts: Extended Emergency Contact Information Primary Emergency Contact: Shoaff,Abby Address: [address]          [place], [place] [address] [place] Phone: [phone] Relation: Sister  Code Status: FULL  Allergies: Review of patient's allergies indicates no known allergies.  Chief Complaint  Patient presents with  . New Admit To SNF    HPI: Patient is 59 y.o. male who is admitted to SNF after R knee arthroplasy for OT/PT. While at SNF will follow pt's HTN, on norvasc 5 mg on arrival to SNF and increased after  admission Norvasc 10 mg for poor control, ABLA, which will require a follow up CBC and pain control for knee with norco and robaxin as well as monitoring for post op infection as pt's knee on exam was slightly warmer than would be expected.  Past Medical History  Diagnosis Date  . History of blood clots     to left arm  . Arthritis     bil knees, left hand  . DVT of upper extremity (deep vein thrombosis)     left brachial vein, 12/2010  . Hypertension   . Shortness of breath dyspnea     with exertion    Past Surgical History  Procedure Laterality Date  . Left  hand   2011    cyst removed.  . Total knee arthroplasty  10/19/2011    Procedure: TOTAL KNEE ARTHROPLASTY;  Surgeon: Sharmon Revere;  Location: [facility];  Service: Orthopedics;  Laterality: Left;  . Colonscopy       1 year ago   . Knee arthroplasty Right 04/30/2015    Procedure: COMPUTER ASSISTED TOTAL KNEE ARTHROPLASTY;  Surgeon: Marybelle Killings, MD;  Location: [facility];  Service: Orthopedics;  Laterality: Right;      Medication List       This list is accurate as of: 05/06/15 11:59 PM.  Always use your most recent med list.               amLODipine 5 MG tablet  Commonly known as:  NORVASC  Take 1  tablet (5 mg total) by mouth daily.     aspirin EC 325 MG tablet  Take 1 tablet (325 mg total) by mouth daily.     methocarbamol 500 MG tablet  Commonly known as:  ROBAXIN  Take 1 tablet (500 mg total) by mouth 4 (four) times daily.     oxyCODONE-acetaminophen 7.5-325 MG per tablet  Commonly known as:  PERCOCET  Take 1 tablet by mouth every 4 (four) hours as needed for severe pain.        No orders of the defined types were placed in this encounter.    Immunization History  Administered Date(s) Administered  . Influenza,inj,Quad PF,36+ Mos 11/17/2014    History  Substance Use Topics  . Smoking status: Never Smoker   . Smokeless tobacco: Not on file  . Alcohol Use: No    Family history is + for prostate CA.   Review of Systems  DATA OBTAINED: from patient, nurse GENERAL:  no fevers, fatigue, appetite changes SKIN: No itching, rash or wounds EYES: No eye pain, redness, discharge EARS: No earache, tinnitus, change in hearing NOSE: No congestion, drainage or bleeding  MOUTH/THROAT: No mouth or tooth pain, No sore throat RESPIRATORY:  No cough, wheezing, SOB CARDIAC: No chest pain, palpitations, lower extremity edema  GI: No abdominal pain, No N/V/D or constipation, No heartburn or reflux  GU: No dysuria, frequency or urgency, or incontinence  MUSCULOSKELETAL:Knee pain has improved some but not much NEUROLOGIC: No headache, dizziness or focal weakness PSYCHIATRIC: No overt anxiety or sadness, No behavior issue.   Filed Vitals:   05/06/15 1330  BP: 158/75  Pulse: 110  Temp: 97 F (36.1 C)  Resp: 20    Physical Exam  GENERAL APPEARANCE: Alert, conversant,  No acute distress.  SKIN: incision dressed, a little more heat to area than expected without redness HEAD: Normocephalic, atraumatic  EYES: Conjunctiva/lids clear. Pupils round, reactive. EOMs intact.  EARS: External exam WNL, canals clear. Hearing grossly normal.  NOSE: No deformity or discharge.   MOUTH/THROAT: Lips w/o lesions  RESPIRATORY: Breathing is even, unlabored. Lung sounds are clear   CARDIOVASCULAR: Heart RRR no murmurs, rubs or gallops.trace peripheral edema RLE  GASTROINTESTINAL: Abdomen is soft, non-tender, not distended w/ normal bowel sounds. GENITOURINARY: Bladder non tender, not distended  MUSCULOSKELETAL: No abnormal joints or musculature NEUROLOGIC:  Cranial nerves 2-12 grossly intact. Moves all extremities  PSYCHIATRIC: Mood and affect appropriate to situation, no behavioral issues  Patient Active Problem List   Diagnosis Date Noted  . History of DVT (deep vein thrombosis) 05/09/2015  . Acute blood loss anemia 05/06/2015  . 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 Left ventricular hypertrophy by electrocardiogram 04/07/2015  . Essential hypertension, benign 04/01/2014  . Osteoarthritis 04/01/2014  . Knee pain, right 04/01/2014      CBC    Component Value Date/Time   WBC 10.1 05/03/2015 0405   RBC 3.40* 05/03/2015 0405   HGB 9.8* 05/03/2015 0405   HCT 28.9* 05/03/2015 0405   PLT 206 05/03/2015 0405   MCV 85.0 05/03/2015 0405   LYMPHSABS 1.2 10/03/2014 1126   MONOABS 0.6 10/03/2014 1126   EOSABS 0.0 10/03/2014 1126   BASOSABS 0.0 10/03/2014 1126    CMP     Component Value Date/Time   NA 132* 05/01/2015 0428   K 4.0 05/01/2015 0428   CL 98* 05/01/2015 0428   CO2 27 05/01/2015 0428   GLUCOSE 138* 05/01/2015 0428   BUN 14 05/01/2015 0428   CREATININE 1.05 05/01/2015 0428   CREATININE 0.96 11/17/2014 1218   CALCIUM 9.2 05/01/2015 0428   PROT 8.1 03/18/2015 1033   ALBUMIN 4.2 03/18/2015 1033   AST 31 03/18/2015 1033   ALT 32 03/18/2015 1033   ALKPHOS 55 03/18/2015 1033   BILITOT 1.2 03/18/2015 1033   GFRNONAA >60 05/01/2015 0428   GFRNONAA 87 11/17/2014 1218   GFRAA >60 05/01/2015 0428   GFRAA >89 11/17/2014 1218    Assessment and Plan  Status post total right knee replacement COMPUTER ASSISTED TOTAL KNEE  ARTHROPLASTY. for endstage DJD; no difficulties, plan- Robaxin, oercocet and ASA 325 mg daily as prophylaxis  Essential hypertension, benign On amlodipine 5 mg;inc to 10 mg for better control  Acute blood loss anemia PHR -Hb 13.8 to 9.8; no mention of transfusion; I will order CBC to follow  History of DVT (deep vein thrombosis) Hx of upper extremity DVT;pt is s/p surgery and on ASA only ; special care in monitoring for post op DVT    Hennie Duos, MD

## 2015-05-06 NOTE — Assessment & Plan Note (Addendum)
COMPUTER ASSISTED TOTAL KNEE ARTHROPLASTY. for endstage DJD; no difficulties,SNF plan- Robaxin, percocet for pain and ASA 325 mg daily as prophylaxis

## 2015-05-07 NOTE — Addendum Note (Signed)
Addendum  created 05/07/15 0128 by Roberts Gaudy, MD   Modules edited: Anesthesia Events, Anesthesia Responsible Staff, Narrator, Notes Section   Narrator:  Narrator: Event Log Edited   Notes Section:  File: [id number]; Pend: [id number]

## 2015-05-07 NOTE — Anesthesia Postprocedure Evaluation (Signed)
  Anesthesia Post-op Note  Patient: Dustin [name]  Procedure(s) Performed: Procedure(s): COMPUTER ASSISTED TOTAL KNEE ARTHROPLASTY (Right)  Patient Location: PACU  Anesthesia Type:General  Level of Consciousness: awake, alert  and oriented  Airway and Oxygen Therapy: Patient Spontanous Breathing and Patient connected to nasal cannula oxygen  Post-op Pain: none  Post-op Assessment: Post-op Vital signs reviewed, Patient's Cardiovascular Status Stable, Respiratory Function Stable, Patent Airway and Pain level controlled LLE Motor Response: Purposeful movement   RLE Motor Response: Purposeful movement, Responds to commands   L Sensory Level:  (able to bend knee) R Sensory Level: S1-Sole of foot, small toes  Post-op Vital Signs: stable  Last Vitals:  Filed Vitals:   05/04/15 1300  BP: 129/70  Pulse: 88  Temp: 36.7 C  Resp: 18    Complications: No apparent anesthesia complications

## 2015-05-09 ENCOUNTER — Encounter: Payer: Self-pay | Admitting: Internal Medicine

## 2015-05-09 DIAGNOSIS — Z86718 Personal history of other venous thrombosis and embolism: Secondary | ICD-10-CM | POA: Insufficient documentation

## 2015-05-09 NOTE — Assessment & Plan Note (Signed)
Hx of upper extremity DVT;pt is s/p surgery and on ASA only ; special care in monitoring for post op DVT

## 2015-05-11 ENCOUNTER — Non-Acute Institutional Stay (SKILLED_NURSING_FACILITY): Payer: Medicare Other | Admitting: Internal Medicine

## 2015-05-11 DIAGNOSIS — M7989 Other specified soft tissue disorders: Secondary | ICD-10-CM | POA: Diagnosis not present

## 2015-05-11 DIAGNOSIS — T8484XD Pain due to internal orthopedic prosthetic devices, implants and grafts, subsequent encounter: Secondary | ICD-10-CM

## 2015-05-11 DIAGNOSIS — I10 Essential (primary) hypertension: Secondary | ICD-10-CM

## 2015-05-11 DIAGNOSIS — Z96651 Presence of right artificial knee joint: Secondary | ICD-10-CM

## 2015-05-11 NOTE — Progress Notes (Signed)
MRN: [id number] Name: Dustin [name]  Sex: male Age: 59 y.o. DOB: 1955/11/01  [facility] Facility/Room:[facility] Level Of Care: SNF Provider: Inocencio Homes D Emergency Contacts: Extended Emergency Contact Information Primary Emergency Contact: Frech,Abby Address: [address]          [place], [place] [address] [place] Phone: [phone] Relation: Sister  Code Status:   Allergies: Review of patient's allergies indicates no known allergies.  Chief Complaint  Patient presents with  . Acute Visit  . Medical Management of Chronic Issues    HPI: Patient is 59 y.o. male who is being seen for uncontrolled RLE pain and RLE swelling s/p R arthroplasty. BP will be followed up also.  Past Medical History  Diagnosis Date  . History of blood clots     to left arm  . Arthritis     bil knees, left hand  . DVT of upper extremity (deep vein thrombosis)     left brachial vein, 12/2010  . Hypertension   . Shortness of breath dyspnea     with exertion    Past Surgical History  Procedure Laterality Date  . Left  hand   2011    cyst removed.  . Total knee arthroplasty  10/19/2011    Procedure: TOTAL KNEE ARTHROPLASTY;  Surgeon: Sharmon Revere;  Location: [facility];  Service: Orthopedics;  Laterality: Left;  . Colonscopy       1 year ago   . Knee arthroplasty Right 04/30/2015    Procedure: COMPUTER ASSISTED TOTAL KNEE ARTHROPLASTY;  Surgeon: Marybelle Killings, MD;  Location: [facility];  Service: Orthopedics;  Laterality: Right;      Medication List       This list is accurate as of: 05/11/15 11:59 PM.  Always use your most recent med list.               amLODipine 5 MG tablet  Commonly known as:  NORVASC  Take 1 tablet (5 mg total) by mouth daily.     aspirin EC 325 MG tablet  Take 1 tablet (325 mg total) by mouth daily.     methocarbamol 500 MG tablet  Commonly known as:  ROBAXIN  Take 1 tablet (500 mg total) by mouth 4 (four) times daily.      oxyCODONE-acetaminophen 7.5-325 MG per tablet  Commonly known as:  PERCOCET  Take 1 tablet by mouth every 4 (four) hours as needed for severe pain.        No orders of the defined types were placed in this encounter.    Immunization History  Administered Date(s) Administered  . Influenza,inj,Quad PF,36+ Mos 11/17/2014    History  Substance Use Topics  . Smoking status: Never Smoker   . Smokeless tobacco: Not on file  . Alcohol Use: No    Review of Systems  DATA OBTAINED: from patient, nurse GENERAL:  no fevers, fatigue, appetite changes SKIN: No itching, rash HEENT: No complaint RESPIRATORY: No cough, wheezing, SOB CARDIAC: No chest pain, palpitations, lower extremity edema  GI: No abdominal pain, No N/V/D or constipation, No heartburn or reflux  GU: No dysuria, frequency or urgency, or incontinence  MUSCULOSKELETAL: + unrelieved bone/joint pain R knee NEUROLOGIC: No headache, dizziness  PSYCHIATRIC: No c/o anxiety or sadness  Filed Vitals:   05/11/15 2254  BP: 144/73  Pulse: 98  Temp: 98.9 F (37.2 C)  Resp: 18    Physical Exam  GENERAL APPEARANCE: Alert, conversant, No  acute distress  SKIN: no obvious cellulitis but R kneeis more blushy and warm that  i would expect this far out of surgery HEENT: Unremarkable RESPIRATORY: Breathing is even, unlabored. Lung sounds are clear   CARDIOVASCULAR: Heart RRR no murmurs, rubs or gallops. RLE edema, significant , up into thigh with some tensenes GASTROINTESTINAL: Abdomen is soft, non-tender, not distended w/ normal bowel sounds.  GENITOURINARY: Bladder non tender, not distended  MUSCULOSKELETAL: s/p r knee arthroplasy NEUROLOGIC: Cranial nerves 2-12 grossly intact. Moves all extremities PSYCHIATRIC: Mood and affect appropriate to situation, no behavioral issues  Patient Active Problem List   Diagnosis Date Noted  . Swelling of right lower extremity 05/15/2015  . Pain due to total right knee replacement 05/15/2015   . History of DVT (deep vein thrombosis) 05/09/2015  . Acute blood loss anemia 05/06/2015  . 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 Left ventricular hypertrophy by electrocardiogram 04/07/2015  . Essential hypertension, benign 04/01/2014  . Osteoarthritis 04/01/2014  . Knee pain, right 04/01/2014    CBC    Component Value Date/Time   WBC 10.1 05/03/2015 0405   RBC 3.40* 05/03/2015 0405   HGB 9.8* 05/03/2015 0405   HCT 28.9* 05/03/2015 0405   PLT 206 05/03/2015 0405   MCV 85.0 05/03/2015 0405   LYMPHSABS 1.2 10/03/2014 1126   MONOABS 0.6 10/03/2014 1126   EOSABS 0.0 10/03/2014 1126   BASOSABS 0.0 10/03/2014 1126    CMP     Component Value Date/Time   NA 132* 05/01/2015 0428   K 4.0 05/01/2015 0428   CL 98* 05/01/2015 0428   CO2 27 05/01/2015 0428   GLUCOSE 138* 05/01/2015 0428   BUN 14 05/01/2015 0428   CREATININE 1.05 05/01/2015 0428   CREATININE 0.96 11/17/2014 1218   CALCIUM 9.2 05/01/2015 0428   PROT 8.1 03/18/2015 1033   ALBUMIN 4.2 03/18/2015 1033   AST 31 03/18/2015 1033   ALT 32 03/18/2015 1033   ALKPHOS 55 03/18/2015 1033   BILITOT 1.2 03/18/2015 1033   GFRNONAA >60 05/01/2015 0428   GFRNONAA 87 11/17/2014 1218   GFRAA >60 05/01/2015 0428   GFRAA >89 11/17/2014 1218    Assessment and Plan  Swelling of right lower extremity Pt's swelling should be better, not worse. Pain in R leg should be better. With h/o RUE DVT have ordered RLE U/S to r/o DVT.  Pain due to total right knee replacement Pt's pain was discussed on admission but not getting adequate control. He thinks spasms may be involved.  Will put pt on on ER oxycocone for 10 days and in robaxin to 1000 mg q 6.    Hennie Duos, MD

## 2015-05-15 ENCOUNTER — Encounter: Payer: Self-pay | Admitting: Internal Medicine

## 2015-05-15 DIAGNOSIS — T8484XA Pain due to internal orthopedic prosthetic devices, implants and grafts, initial encounter: Secondary | ICD-10-CM | POA: Insufficient documentation

## 2015-05-15 DIAGNOSIS — M7989 Other specified soft tissue disorders: Secondary | ICD-10-CM | POA: Insufficient documentation

## 2015-05-15 DIAGNOSIS — Z96651 Presence of right artificial knee joint: Secondary | ICD-10-CM

## 2015-05-15 NOTE — Assessment & Plan Note (Signed)
Pt's pain was discussed on admission but not getting adequate control. He thinks spasms may be involved.  Will put pt on on ER oxycocone for 10 days and in robaxin to 1000 mg q 6.

## 2015-05-15 NOTE — Assessment & Plan Note (Signed)
Pt's swelling should be better, not worse. Pain in R leg should be better. With h/o RUE DVT have ordered RLE U/S to r/o DVT.

## 2015-05-15 NOTE — Assessment & Plan Note (Signed)
Improved;not at goal yet, will cont follow on same dose

## 2015-05-20 ENCOUNTER — Encounter: Payer: Self-pay | Admitting: Internal Medicine

## 2015-05-20 ENCOUNTER — Non-Acute Institutional Stay (SKILLED_NURSING_FACILITY): Payer: Medicare Other | Admitting: Internal Medicine

## 2015-05-20 DIAGNOSIS — I10 Essential (primary) hypertension: Secondary | ICD-10-CM

## 2015-05-20 DIAGNOSIS — Z96651 Presence of right artificial knee joint: Secondary | ICD-10-CM | POA: Diagnosis not present

## 2015-05-20 DIAGNOSIS — T8484XD Pain due to internal orthopedic prosthetic devices, implants and grafts, subsequent encounter: Secondary | ICD-10-CM

## 2015-05-20 NOTE — Progress Notes (Signed)
MRN: [id number] Name: Dustin [name]  Sex: male Age: 59 y.o. DOB: Oct 11, 1956  [facility] #: [facility] Facility/Room: 308 Level Of Care: SNF Provider: Inocencio Homes D Emergency Contacts: Extended Emergency Contact Information Primary Emergency Contact: Meiners,Abby Address: [address]          [place], [place] [address] [place] Phone: [phone] Relation: Sister  Code Status: FULL  Allergies: Review of patient's allergies indicates no known allergies.  Chief Complaint  Patient presents with  . Discharge Note    HPI: Patient is 59 y.o. male who was admitted to SNF for R knee arthroplasty for OT/PT who has reached maximum therapy and ready to be d/c to home.  Past Medical History  Diagnosis Date  . History of blood clots     to left arm  . Arthritis     bil knees, left hand  . DVT of upper extremity (deep vein thrombosis)     left brachial vein, 12/2010  . Hypertension   . Shortness of breath dyspnea     with exertion    Past Surgical History  Procedure Laterality Date  . Left  hand   2011    cyst removed.  . Total knee arthroplasty  10/19/2011    Procedure: TOTAL KNEE ARTHROPLASTY;  Surgeon: Sharmon Revere;  Location: [facility];  Service: Orthopedics;  Laterality: Left;  . Colonscopy       1 year ago   . Knee arthroplasty Right 04/30/2015    Procedure: COMPUTER ASSISTED TOTAL KNEE ARTHROPLASTY;  Surgeon: Marybelle Killings, MD;  Location: [facility];  Service: Orthopedics;  Laterality: Right;      Medication List       This list is accurate as of: 05/20/15  4:31 PM.  Always use your most recent med list.               amLODipine 5 MG tablet  Commonly known as:  NORVASC  Take 1 tablet (5 mg total) by mouth daily.     aspirin EC 325 MG tablet  Take 1 tablet (325 mg total) by mouth daily.     methocarbamol 500 MG tablet  Commonly known as:  ROBAXIN  Take 1 tablet (500 mg total) by mouth 4 (four) times daily.     oxyCODONE-acetaminophen  7.5-325 MG per tablet  Commonly known as:  PERCOCET  Take 1 tablet by mouth every 4 (four) hours as needed for severe pain.        No orders of the defined types were placed in this encounter.    Immunization History  Administered Date(s) Administered  . Influenza,inj,Quad PF,36+ Mos 11/17/2014    History  Substance Use Topics  . Smoking status: Never Smoker   . Smokeless tobacco: Not on file  . Alcohol Use: No    Filed Vitals:   05/20/15 1621  BP: 156/81  Pulse: 111  Temp: 98 F (36.7 C)  Resp: 20    Physical Exam  GENERAL APPEARANCE: Alert, conversant. No acute distress.  HEENT: Unremarkable. RESPIRATORY: Breathing is even, unlabored. Lung sounds are clear   CARDIOVASCULAR: Heart RRR no murmurs, rubs or gallops. No peripheral edema.  GASTROINTESTINAL: Abdomen is soft, non-tender, not distended w/ normal bowel sounds.  NEUROLOGIC: Cranial nerves 2-12 grossly intact  Patient Active Problem List   Diagnosis Date Noted  . Swelling of right lower extremity 05/15/2015  . Pain due to total right knee replacement 05/15/2015  . History of DVT (deep  vein thrombosis) 05/09/2015  . Acute blood loss anemia 05/06/2015  . 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 Left ventricular hypertrophy by electrocardiogram 04/07/2015  . Essential hypertension, benign 04/01/2014  . Osteoarthritis 04/01/2014  . Knee pain, right 04/01/2014    CBC    Component Value Date/Time   WBC 10.1 05/03/2015 0405   RBC 3.40* 05/03/2015 0405   HGB 9.8* 05/03/2015 0405   HCT 28.9* 05/03/2015 0405   PLT 206 05/03/2015 0405   MCV 85.0 05/03/2015 0405   LYMPHSABS 1.2 10/03/2014 1126   MONOABS 0.6 10/03/2014 1126   EOSABS 0.0 10/03/2014 1126   BASOSABS 0.0 10/03/2014 1126    CMP     Component Value Date/Time   NA 132* 05/01/2015 0428   K 4.0 05/01/2015 0428   CL 98* 05/01/2015 0428   CO2 27 05/01/2015 0428   GLUCOSE 138* 05/01/2015 0428   BUN 14 05/01/2015 0428    CREATININE 1.05 05/01/2015 0428   CREATININE 0.96 11/17/2014 1218   CALCIUM 9.2 05/01/2015 0428   PROT 8.1 03/18/2015 1033   ALBUMIN 4.2 03/18/2015 1033   AST 31 03/18/2015 1033   ALT 32 03/18/2015 1033   ALKPHOS 55 03/18/2015 1033   BILITOT 1.2 03/18/2015 1033   GFRNONAA >60 05/01/2015 0428   GFRNONAA 87 11/17/2014 1218   GFRAA >60 05/01/2015 0428   GFRAA >89 11/17/2014 1218    Assessment and Plan  Pt is stable to be d/c to home. During stay there was some problem with getting pain controlled , treated with a higher dose percocet and robaxin. I have written #20 percocet for discharge and told pt he will need follow with PCP or ortho for more. I had no f/u for how long pt is to be on ASA 325 mg. History of DVT, he will stay on it until orthopedic surgeon follows up next.  > 30 min spent with pt , record , consults and writing RX Hennie Duos, MD

## 2015-05-24 DIAGNOSIS — I10 Essential (primary) hypertension: Secondary | ICD-10-CM | POA: Diagnosis not present

## 2015-05-24 DIAGNOSIS — M8560 Other cyst of bone, unspecified site: Secondary | ICD-10-CM | POA: Diagnosis not present

## 2015-05-24 DIAGNOSIS — Z471 Aftercare following joint replacement surgery: Secondary | ICD-10-CM | POA: Diagnosis not present

## 2015-05-24 DIAGNOSIS — M19042 Primary osteoarthritis, left hand: Secondary | ICD-10-CM | POA: Diagnosis not present

## 2015-05-24 DIAGNOSIS — I517 Cardiomegaly: Secondary | ICD-10-CM | POA: Diagnosis not present

## 2015-05-24 DIAGNOSIS — M2570 Osteophyte, unspecified joint: Secondary | ICD-10-CM | POA: Diagnosis not present

## 2015-05-27 ENCOUNTER — Encounter: Payer: Self-pay | Admitting: Internal Medicine

## 2015-05-27 ENCOUNTER — Ambulatory Visit: Payer: Medicare Other | Attending: Internal Medicine | Admitting: Internal Medicine

## 2015-05-27 VITALS — BP 130/70 | HR 97 | Temp 98.4°F | Resp 16 | Wt 199.2 lb

## 2015-05-27 DIAGNOSIS — I10 Essential (primary) hypertension: Secondary | ICD-10-CM

## 2015-05-27 DIAGNOSIS — Z79899 Other long term (current) drug therapy: Secondary | ICD-10-CM | POA: Insufficient documentation

## 2015-05-27 DIAGNOSIS — M19042 Primary osteoarthritis, left hand: Secondary | ICD-10-CM | POA: Diagnosis not present

## 2015-05-27 DIAGNOSIS — M25561 Pain in right knee: Secondary | ICD-10-CM | POA: Insufficient documentation

## 2015-05-27 DIAGNOSIS — Z7982 Long term (current) use of aspirin: Secondary | ICD-10-CM | POA: Diagnosis not present

## 2015-05-27 DIAGNOSIS — M8560 Other cyst of bone, unspecified site: Secondary | ICD-10-CM | POA: Diagnosis not present

## 2015-05-27 DIAGNOSIS — I517 Cardiomegaly: Secondary | ICD-10-CM | POA: Diagnosis not present

## 2015-05-27 DIAGNOSIS — Z471 Aftercare following joint replacement surgery: Secondary | ICD-10-CM | POA: Diagnosis not present

## 2015-05-27 DIAGNOSIS — M2570 Osteophyte, unspecified joint: Secondary | ICD-10-CM | POA: Diagnosis not present

## 2015-05-27 MED ORDER — METHOCARBAMOL 500 MG PO TABS: 500.0000 mg | ORAL_TABLET | Freq: Four times a day (QID) | ORAL | Status: DC

## 2015-05-27 MED ORDER — TRAMADOL HCL 50 MG PO TABS: 50.0000 mg | ORAL_TABLET | Freq: Three times a day (TID) | ORAL | Status: DC | PRN

## 2015-05-27 MED ORDER — IBUPROFEN 600 MG PO TABS: 600.0000 mg | ORAL_TABLET | Freq: Three times a day (TID) | ORAL | Status: DC | PRN

## 2015-05-27 NOTE — Progress Notes (Signed)
MRN: [id number] Name: Dustin [name]  Sex: male Age: 59 y.o. DOB: 1955/11/19  Allergies: Review of patient's allergies indicates no known allergies.  Chief Complaint  Patient presents with  . Follow-up    knee replacement    HPI: Patient is 59 y.o. male who has to of hypertension, history of right knee osteoarthritis, patient is status post right knee surgery, subsequently patient went to the rehabilitation and was discharged on July 22, was recommended to continue with physical therapy at home, he brought the form to be filled out, patient is today requesting prescription for pain medication, he was getting Robaxin, Percocet to use when necessary for pain,patient also has a scheduled 100 with orthopedic surgeon next month, he denies any headache dizziness chest and shortness of breath denies any fever chills.  Past Medical History  Diagnosis Date  . History of blood clots     to left arm  . Arthritis     bil knees, left hand  . DVT of upper extremity (deep vein thrombosis)     left brachial vein, 12/2010  . Hypertension   . Shortness of breath dyspnea     with exertion    Past Surgical History  Procedure Laterality Date  . Left  hand   2011    cyst removed.  . Total knee arthroplasty  10/19/2011    Procedure: TOTAL KNEE ARTHROPLASTY;  Surgeon: Sharmon Revere;  Location: [facility];  Service: Orthopedics;  Laterality: Left;  . Colonscopy       1 year ago   . Knee arthroplasty Right 04/30/2015    Procedure: COMPUTER ASSISTED TOTAL KNEE ARTHROPLASTY;  Surgeon: Marybelle Killings, MD;  Location: [facility];  Service: Orthopedics;  Laterality: Right;      Medication List       This list is accurate as of: 05/27/15 10:07 AM.  Always use your most recent med list.               amLODipine 5 MG tablet  Commonly known as:  NORVASC  Take 1 tablet (5 mg total) by mouth daily.     aspirin EC 325 MG tablet  Take 1 tablet (325 mg total) by mouth daily.     ibuprofen 600 MG tablet    Commonly known as:  ADVIL,MOTRIN  Take 1 tablet (600 mg total) by mouth every 8 (eight) hours as needed.     methocarbamol 500 MG tablet  Commonly known as:  ROBAXIN  Take 1 tablet (500 mg total) by mouth 4 (four) times daily.     oxyCODONE-acetaminophen 7.5-325 MG per tablet  Commonly known as:  PERCOCET  Take 1 tablet by mouth every 4 (four) hours as needed for severe pain.     traMADol 50 MG tablet  Commonly known as:  ULTRAM  Take 1 tablet (50 mg total) by mouth every 8 (eight) hours as needed for moderate pain.        Meds ordered this encounter  Medications  . methocarbamol (ROBAXIN) 500 MG tablet    Sig: Take 1 tablet (500 mg total) by mouth 4 (four) times daily.    Dispense:  60 tablet    Refill:  0  . traMADol (ULTRAM) 50 MG tablet    Sig: Take 1 tablet (50 mg total) by mouth every 8 (eight) hours as needed for moderate pain.    Dispense:  30 tablet    Refill:  0  . ibuprofen (ADVIL,MOTRIN) 600 MG tablet  Sig: Take 1 tablet (600 mg total) by mouth every 8 (eight) hours as needed.    Dispense:  30 tablet    Refill:  1    Immunization History  Administered Date(s) Administered  . Influenza,inj,Quad PF,36+ Mos 11/17/2014    Family History  Problem Relation Age of Onset  . Diabetes Other   . Hypertension Other   . Anesthesia problems Neg Hx   . Hypotension Neg Hx   . Malignant hyperthermia Neg Hx   . Pseudochol deficiency Neg Hx   . Cancer Mother   . Cancer Father     prostate cancer  . Diabetes Maternal Uncle     History  Substance Use Topics  . Smoking status: Never Smoker   . Smokeless tobacco: Not on file  . Alcohol Use: No    Review of Systems   As noted in HPI  Filed Vitals:   05/27/15 1007  BP: 130/70  Pulse:   Temp:   Resp:     Physical Exam  Physical Exam  Constitutional: No distress.  Eyes: EOM are normal. Pupils are equal, round, and reactive to light.  Cardiovascular: Normal rate and regular rhythm.   Pulmonary/Chest:  Breath sounds normal. No respiratory distress. He has no wheezes. He has no rales.  Musculoskeletal:  Right knee anteriorly surgical scar healing well, no skin breakdown or discharge, no tenderness, right knee  some limitation with  full range of motion likely secondary to swelling.     Labs   Lab Results  Component Value Date   WBC 10.1 05/03/2015   HGB 9.8* 05/03/2015   HCT 28.9* 05/03/2015   PLT 206 05/03/2015   GLUCOSE 138* 05/01/2015   CHOL 183 04/01/2014   TRIG 43 04/01/2014   HDL 60 04/01/2014   LDLCALC 114* 04/01/2014   ALT 32 03/18/2015   AST 31 03/18/2015   NA 132* 05/01/2015   K 4.0 05/01/2015   CL 98* 05/01/2015   CREATININE 1.05 05/01/2015   BUN 14 05/01/2015   CO2 27 05/01/2015   TSH 0.695 04/01/2014   INR 1.12 04/23/2015    No results found for: HGBA1C   Assessment and Plan  Knee pain, right - Plan: methocarbamol (ROBAXIN) 500 MG tablet, traMADol (ULTRAM) 50 MG tablet, ibuprofen (ADVIL,MOTRIN) 600 MG tablet Patient follow with orthopedics. Continue with physical therapy at home  Essential hypertension, benign Continue with DASH diet, amlodipine.    Return in about 3 months (around 08/27/2015), or if symptoms worsen or fail to improve.   This note has been created with Surveyor, quantity. Any transcriptional errors are unintentional.    Lorayne Marek, MD

## 2015-05-27 NOTE — Progress Notes (Signed)
Patient here for follow up  Patient was recently discharged from [facility] rehab Patient had a total right knee replacement on 7/1 Patient states he is still having a lot of pain and would like A prescription for pain medication

## 2015-05-31 DIAGNOSIS — M19042 Primary osteoarthritis, left hand: Secondary | ICD-10-CM | POA: Diagnosis not present

## 2015-05-31 DIAGNOSIS — Z471 Aftercare following joint replacement surgery: Secondary | ICD-10-CM | POA: Diagnosis not present

## 2015-05-31 DIAGNOSIS — I10 Essential (primary) hypertension: Secondary | ICD-10-CM | POA: Diagnosis not present

## 2015-05-31 DIAGNOSIS — I517 Cardiomegaly: Secondary | ICD-10-CM | POA: Diagnosis not present

## 2015-05-31 DIAGNOSIS — M8560 Other cyst of bone, unspecified site: Secondary | ICD-10-CM | POA: Diagnosis not present

## 2015-05-31 DIAGNOSIS — M2570 Osteophyte, unspecified joint: Secondary | ICD-10-CM | POA: Diagnosis not present

## 2015-06-04 DIAGNOSIS — I10 Essential (primary) hypertension: Secondary | ICD-10-CM | POA: Diagnosis not present

## 2015-06-04 DIAGNOSIS — I517 Cardiomegaly: Secondary | ICD-10-CM | POA: Diagnosis not present

## 2015-06-04 DIAGNOSIS — M8560 Other cyst of bone, unspecified site: Secondary | ICD-10-CM | POA: Diagnosis not present

## 2015-06-04 DIAGNOSIS — Z471 Aftercare following joint replacement surgery: Secondary | ICD-10-CM | POA: Diagnosis not present

## 2015-06-04 DIAGNOSIS — M19042 Primary osteoarthritis, left hand: Secondary | ICD-10-CM | POA: Diagnosis not present

## 2015-06-04 DIAGNOSIS — M2570 Osteophyte, unspecified joint: Secondary | ICD-10-CM | POA: Diagnosis not present

## 2015-06-07 DIAGNOSIS — M2570 Osteophyte, unspecified joint: Secondary | ICD-10-CM | POA: Diagnosis not present

## 2015-06-07 DIAGNOSIS — I517 Cardiomegaly: Secondary | ICD-10-CM | POA: Diagnosis not present

## 2015-06-07 DIAGNOSIS — M8560 Other cyst of bone, unspecified site: Secondary | ICD-10-CM | POA: Diagnosis not present

## 2015-06-07 DIAGNOSIS — M19042 Primary osteoarthritis, left hand: Secondary | ICD-10-CM | POA: Diagnosis not present

## 2015-06-07 DIAGNOSIS — I10 Essential (primary) hypertension: Secondary | ICD-10-CM | POA: Diagnosis not present

## 2015-06-07 DIAGNOSIS — Z471 Aftercare following joint replacement surgery: Secondary | ICD-10-CM | POA: Diagnosis not present

## 2015-06-09 DIAGNOSIS — I10 Essential (primary) hypertension: Secondary | ICD-10-CM | POA: Diagnosis not present

## 2015-06-09 DIAGNOSIS — M8560 Other cyst of bone, unspecified site: Secondary | ICD-10-CM | POA: Diagnosis not present

## 2015-06-09 DIAGNOSIS — M19042 Primary osteoarthritis, left hand: Secondary | ICD-10-CM | POA: Diagnosis not present

## 2015-06-09 DIAGNOSIS — Z471 Aftercare following joint replacement surgery: Secondary | ICD-10-CM | POA: Diagnosis not present

## 2015-06-09 DIAGNOSIS — I517 Cardiomegaly: Secondary | ICD-10-CM | POA: Diagnosis not present

## 2015-06-09 DIAGNOSIS — M2570 Osteophyte, unspecified joint: Secondary | ICD-10-CM | POA: Diagnosis not present

## 2015-06-11 DIAGNOSIS — I517 Cardiomegaly: Secondary | ICD-10-CM | POA: Diagnosis not present

## 2015-06-11 DIAGNOSIS — I10 Essential (primary) hypertension: Secondary | ICD-10-CM | POA: Diagnosis not present

## 2015-06-11 DIAGNOSIS — Z471 Aftercare following joint replacement surgery: Secondary | ICD-10-CM | POA: Diagnosis not present

## 2015-06-11 DIAGNOSIS — M2570 Osteophyte, unspecified joint: Secondary | ICD-10-CM | POA: Diagnosis not present

## 2015-06-11 DIAGNOSIS — M8560 Other cyst of bone, unspecified site: Secondary | ICD-10-CM | POA: Diagnosis not present

## 2015-06-11 DIAGNOSIS — M19042 Primary osteoarthritis, left hand: Secondary | ICD-10-CM | POA: Diagnosis not present

## 2015-06-14 DIAGNOSIS — M19042 Primary osteoarthritis, left hand: Secondary | ICD-10-CM | POA: Diagnosis not present

## 2015-06-14 DIAGNOSIS — I10 Essential (primary) hypertension: Secondary | ICD-10-CM | POA: Diagnosis not present

## 2015-06-14 DIAGNOSIS — M8560 Other cyst of bone, unspecified site: Secondary | ICD-10-CM | POA: Diagnosis not present

## 2015-06-14 DIAGNOSIS — I517 Cardiomegaly: Secondary | ICD-10-CM | POA: Diagnosis not present

## 2015-06-14 DIAGNOSIS — M2570 Osteophyte, unspecified joint: Secondary | ICD-10-CM | POA: Diagnosis not present

## 2015-06-14 DIAGNOSIS — Z471 Aftercare following joint replacement surgery: Secondary | ICD-10-CM | POA: Diagnosis not present

## 2015-06-16 DIAGNOSIS — Z471 Aftercare following joint replacement surgery: Secondary | ICD-10-CM | POA: Diagnosis not present

## 2015-06-16 DIAGNOSIS — I517 Cardiomegaly: Secondary | ICD-10-CM | POA: Diagnosis not present

## 2015-06-16 DIAGNOSIS — M2570 Osteophyte, unspecified joint: Secondary | ICD-10-CM | POA: Diagnosis not present

## 2015-06-16 DIAGNOSIS — I10 Essential (primary) hypertension: Secondary | ICD-10-CM | POA: Diagnosis not present

## 2015-06-16 DIAGNOSIS — M8560 Other cyst of bone, unspecified site: Secondary | ICD-10-CM | POA: Diagnosis not present

## 2015-06-16 DIAGNOSIS — M19042 Primary osteoarthritis, left hand: Secondary | ICD-10-CM | POA: Diagnosis not present

## 2015-06-18 DIAGNOSIS — M8560 Other cyst of bone, unspecified site: Secondary | ICD-10-CM | POA: Diagnosis not present

## 2015-06-18 DIAGNOSIS — M2570 Osteophyte, unspecified joint: Secondary | ICD-10-CM | POA: Diagnosis not present

## 2015-06-18 DIAGNOSIS — I10 Essential (primary) hypertension: Secondary | ICD-10-CM | POA: Diagnosis not present

## 2015-06-18 DIAGNOSIS — Z471 Aftercare following joint replacement surgery: Secondary | ICD-10-CM | POA: Diagnosis not present

## 2015-06-18 DIAGNOSIS — I517 Cardiomegaly: Secondary | ICD-10-CM | POA: Diagnosis not present

## 2015-06-18 DIAGNOSIS — M19042 Primary osteoarthritis, left hand: Secondary | ICD-10-CM | POA: Diagnosis not present

## 2015-06-21 DIAGNOSIS — M8560 Other cyst of bone, unspecified site: Secondary | ICD-10-CM | POA: Diagnosis not present

## 2015-06-21 DIAGNOSIS — M19042 Primary osteoarthritis, left hand: Secondary | ICD-10-CM | POA: Diagnosis not present

## 2015-06-21 DIAGNOSIS — Z471 Aftercare following joint replacement surgery: Secondary | ICD-10-CM | POA: Diagnosis not present

## 2015-06-21 DIAGNOSIS — M2570 Osteophyte, unspecified joint: Secondary | ICD-10-CM | POA: Diagnosis not present

## 2015-06-21 DIAGNOSIS — I517 Cardiomegaly: Secondary | ICD-10-CM | POA: Diagnosis not present

## 2015-06-21 DIAGNOSIS — I10 Essential (primary) hypertension: Secondary | ICD-10-CM | POA: Diagnosis not present

## 2015-06-24 DIAGNOSIS — I517 Cardiomegaly: Secondary | ICD-10-CM | POA: Diagnosis not present

## 2015-06-24 DIAGNOSIS — M2570 Osteophyte, unspecified joint: Secondary | ICD-10-CM | POA: Diagnosis not present

## 2015-06-24 DIAGNOSIS — M8560 Other cyst of bone, unspecified site: Secondary | ICD-10-CM | POA: Diagnosis not present

## 2015-06-24 DIAGNOSIS — Z471 Aftercare following joint replacement surgery: Secondary | ICD-10-CM | POA: Diagnosis not present

## 2015-06-24 DIAGNOSIS — I10 Essential (primary) hypertension: Secondary | ICD-10-CM | POA: Diagnosis not present

## 2015-06-24 DIAGNOSIS — M19042 Primary osteoarthritis, left hand: Secondary | ICD-10-CM | POA: Diagnosis not present

## 2015-06-29 DIAGNOSIS — M19042 Primary osteoarthritis, left hand: Secondary | ICD-10-CM | POA: Diagnosis not present

## 2015-06-29 DIAGNOSIS — I517 Cardiomegaly: Secondary | ICD-10-CM | POA: Diagnosis not present

## 2015-06-29 DIAGNOSIS — M2570 Osteophyte, unspecified joint: Secondary | ICD-10-CM | POA: Diagnosis not present

## 2015-06-29 DIAGNOSIS — I10 Essential (primary) hypertension: Secondary | ICD-10-CM | POA: Diagnosis not present

## 2015-06-29 DIAGNOSIS — M8560 Other cyst of bone, unspecified site: Secondary | ICD-10-CM | POA: Diagnosis not present

## 2015-06-29 DIAGNOSIS — Z471 Aftercare following joint replacement surgery: Secondary | ICD-10-CM | POA: Diagnosis not present

## 2015-07-01 DIAGNOSIS — M19042 Primary osteoarthritis, left hand: Secondary | ICD-10-CM | POA: Diagnosis not present

## 2015-07-01 DIAGNOSIS — I10 Essential (primary) hypertension: Secondary | ICD-10-CM | POA: Diagnosis not present

## 2015-07-01 DIAGNOSIS — Z471 Aftercare following joint replacement surgery: Secondary | ICD-10-CM | POA: Diagnosis not present

## 2015-07-01 DIAGNOSIS — I517 Cardiomegaly: Secondary | ICD-10-CM | POA: Diagnosis not present

## 2015-07-01 DIAGNOSIS — M8560 Other cyst of bone, unspecified site: Secondary | ICD-10-CM | POA: Diagnosis not present

## 2015-07-01 DIAGNOSIS — M2570 Osteophyte, unspecified joint: Secondary | ICD-10-CM | POA: Diagnosis not present

## 2015-07-07 DIAGNOSIS — I10 Essential (primary) hypertension: Secondary | ICD-10-CM | POA: Diagnosis not present

## 2015-07-07 DIAGNOSIS — M8560 Other cyst of bone, unspecified site: Secondary | ICD-10-CM | POA: Diagnosis not present

## 2015-07-07 DIAGNOSIS — M19042 Primary osteoarthritis, left hand: Secondary | ICD-10-CM | POA: Diagnosis not present

## 2015-07-07 DIAGNOSIS — Z471 Aftercare following joint replacement surgery: Secondary | ICD-10-CM | POA: Diagnosis not present

## 2015-07-07 DIAGNOSIS — I517 Cardiomegaly: Secondary | ICD-10-CM | POA: Diagnosis not present

## 2015-07-07 DIAGNOSIS — M2570 Osteophyte, unspecified joint: Secondary | ICD-10-CM | POA: Diagnosis not present

## 2015-07-12 ENCOUNTER — Ambulatory Visit: Payer: Medicare Other | Attending: Orthopaedic Surgery | Admitting: Physical Therapy

## 2015-07-12 DIAGNOSIS — R609 Edema, unspecified: Secondary | ICD-10-CM | POA: Diagnosis not present

## 2015-07-12 DIAGNOSIS — M25661 Stiffness of right knee, not elsewhere classified: Secondary | ICD-10-CM

## 2015-07-12 DIAGNOSIS — R269 Unspecified abnormalities of gait and mobility: Secondary | ICD-10-CM | POA: Diagnosis not present

## 2015-07-12 DIAGNOSIS — M25561 Pain in right knee: Secondary | ICD-10-CM | POA: Diagnosis not present

## 2015-07-12 DIAGNOSIS — R6889 Other general symptoms and signs: Secondary | ICD-10-CM | POA: Diagnosis not present

## 2015-07-12 DIAGNOSIS — R29898 Other symptoms and signs involving the musculoskeletal system: Secondary | ICD-10-CM | POA: Diagnosis not present

## 2015-07-12 NOTE — Patient Instructions (Signed)
Heat Therapy Heat therapy can help make painful, stiff muscles and joints feel better. Do not use heat on new injuries. Wait at least 48 hours after an injury to use heat. Do not use heat when you have aches or pains right after an activity. If you still have pain 3 hours after stopping the activity, then you may use heat. [name]  Please use before doing your exercise HOME CARE Wet heat pack  Soak a clean towel in warm water. Squeeze out the extra water.  Put the warm, wet towel in a plastic bag.  Place a thin, dry towel between your skin and the bag.  Put the heat pack on the area for 5 minutes, and check your skin. Your skin may be pink, but it should not be red.  Leave the heat pack on the area for 15 to 30 minutes.  Repeat this every 2 to 4 hours while awake. Do not use heat while you are sleeping. Warm water bath  Fill a tub with warm water.  Place the affected body part in the tub.  Soak the area for 20 to 40 minutes.  Repeat as needed. Hot water bottle  Fill the water bottle half full with hot water.  Press out the extra air. Close the cap tightly.  Place a dry towel between your skin and the bottle.heat  Put the bottle on the area for 5 minutes, and check your skin. Your skin may be pink, but it should not be red.  Leave the bottle on the area for 15 to 30 minutes.  Repeat this every 2 to 4 hours while awake. Electric heating pad  Place a dry towel between your skin and the heating pad.  Set the heating pad on low heat.  Put the heating pad on the area for 10 minutes, and check your skin. Your skin may be pink, but it should not be red.  Leave the heating pad on the area for 20 to 40 minutes.  Repeat this every 2 to 4 hours while awake.  Do not lie on the heating pad.  Do not fall asleep while using the heating pad.  Do not use the heating pad near water. GET HELP RIGHT AWAY IF:  You get blisters or red skin.  Your skin is puffy (swollen), or you  lose feeling (numbness) in the affected area.  You have any new problems.  Your problems are getting worse.  You have any questions or concerns. If you have any problems, stop using heat therapy until you see your doctor. MAKE SURE YOU:  Understand these instructions.  Will watch your condition.  Will get help right away if you are not doing well or get worse. Document Released: 01/08/2012 Document Reviewed: 12/09/2013 [facility] Patient Information 2015 [place]. This information is not intended to replace advice given to you by your health care provider. Make sure you discuss any questions you have with your health care provider.    Copyright  VHI. All rights reserved.  HIP: Flexion / KNEE: Extension, Straight Leg Raise   Raise leg, keeping knee straight. Perform slowly. _10__ reps per set, _3__ sets per day, _7__ days per week  Remember slow for motor control   Copyright  VHI. All rights reserved.  Heel Slide   Bend knee and pull heel toward buttocks. Hold __5__ seconds. Return. Repeat with other knee. Repeat __10 x2__ times. Do _1-2__ sessions per day.  http://gt2.exer.us/372   Copyright  VHI. All rights  reserved.     Raise leg until knee is straight. __10_ reps per set, _3__ sets per day, _7__ days per week  2.5 lbs  Copyright  VHI. All rights reserved.  [employer] a large can or rolled towel under leg. Straighten knee and leg. Hold _3-5___ seconds. Repeat with other leg. Repeat 3 x 10 ____ times. Do _1-2___ sessions per day.  http://gt2.exer.us/365   Copyright  VHI. All rights reserved.  Quad Set   Slowly tighten muscles on thigh of straight leg while counting out loud to _5___.  Towel beneath knee.  Every time you see a commercial on TV do exercise.  At least 30 times  http://gt2.exer.us/361   Copyright  VHI. All rights reserved.   Voncille Lo, PT 07/12/2015 2:54 PM Phone: [phone] Fax: [phone]

## 2015-07-12 NOTE — Therapy (Signed)
[place], [place], [address] Phone: [phone]   Fax:  [phone]  Physical Therapy Evaluation  Patient Details  Name: Dustin [name] MRN: [id number] Date of Birth: 01/24/1956 Referring Provider:  Marybelle Killings, MD  Encounter Date: 07/12/2015      PT End of Session - 07/12/15 1546    Visit Number 1   Number of Visits 16   Date for PT Re-Evaluation 09/06/15   Authorization Type Medicare/Medicaid   Authorization Time Period 09/06/15   PT Start Time 0216   PT Stop Time 0310   PT Time Calculation (min) 54 min   Activity Tolerance Patient tolerated treatment well   Behavior During Therapy [facility] for tasks assessed/performed      Past Medical History  Diagnosis Date  . History of blood clots     to left arm  . Arthritis     bil knees, left hand  . DVT of upper extremity (deep vein thrombosis)     left brachial vein, 12/2010  . Hypertension   . Shortness of breath dyspnea     with exertion    Past Surgical History  Procedure Laterality Date  . Left  hand   2011    cyst removed.  . Total knee arthroplasty  10/19/2011    Procedure: TOTAL KNEE ARTHROPLASTY;  Surgeon: Sharmon Revere;  Location: [place];  Service: Orthopedics;  Laterality: Left;  . Colonscopy       1 year ago   . Knee arthroplasty Right 04/30/2015    Procedure: COMPUTER ASSISTED TOTAL KNEE ARTHROPLASTY;  Surgeon: Marybelle Killings, MD;  Location: [place];  Service: Orthopedics;  Laterality: Right;    There were no vitals filed for this visit.  Visit Diagnosis:  Pain in right knee  Abnormality of gait  Edema  Decreased ROM of right knee  Activity intolerance      Subjective Assessment - 07/12/15 1422    Subjective Pt states R knee hurts mostly at night when trying to sleep.  I limp with my R knee    Pertinent History R TKR with Rodell Perna  04-30-15   Limitations Standing;Walking;House hold activities  yard work and uneven ground   How long can you sit comfortably? unlimited    How long can you stand comfortably? 30 min   How long can you walk comfortably? 30 min   Diagnostic tests x ray   Patient Stated Goals I want to not limp,  sleep at night, do yard work,  walk for exercise ( 1 mile)   Currently in Pain? Yes   Pain Score 7    Pain Location Knee   Pain Orientation Right   Pain Descriptors / Indicators Aching   Pain Type Chronic pain   Pain Onset More than a month ago   Pain Frequency Intermittent   Aggravating Factors  sleeping, walking on uneven ground   Pain Relieving Factors medication, ice             [facility] PT Assessment - 07/12/15 1426    Assessment   Medical Diagnosis R TKR    Onset Date/Surgical Date 04/30/15   Hand Dominance Right   Next MD Visit 08-03-15   Prior Therapy [facility]     Precautions   Precautions Knee   Restrictions   Weight Bearing Restrictions Yes   RLE Weight Bearing Weight bearing as tolerated   Balance Screen   Has the patient fallen in the past  6 months No   Has the patient had a decrease in activity level because of a fear of falling?  No   Is the patient reluctant to leave their home because of a fear of falling?  No   Home Environment   Living Environment Private residence   Living Arrangements Alone   Type of [place] to enter   Entrance Stairs-Number of Steps 0   Home Layout One level   Prior Function   Level of Independence Independent;Independent with basic ADLs   Observation/Other Assessments   Focus on Therapeutic Outcomes (FOTO)  intake 59%, limitatoin 41% predicted 32%   Observation/Other Assessments-Edema    Edema Circumferential   Circumferential Edema   Circumferential - Right 50  above patella   Circumferential - Left  48.5   AROM   Right Knee Extension 25  +25 ext lag in LAQ   Right Knee Flexion 100   Left Knee Extension 2   Left Knee Flexion 120   Right Ankle Dorsiflexion 0   Right Ankle Plantar Flexion 45   Left  Ankle Dorsiflexion 8   Left Ankle Plantar Flexion 55   Strength   Overall Strength Comments grossly 4+/5   Right Knee Flexion 4/5   Right Knee Extension 4-/5   Left Knee Flexion 5/5   Left Knee Extension 5/5   Flexibility   Hamstrings Right 60, Left 65   Palpation   Patella mobility Pt R patella with decreased mobiltity lateral to medial   Ambulation/Gait   Ambulation/Gait Yes   Ambulation/Gait Assistance 7: Independent   Assistive device None   Gait Pattern Decreased stance time - right;Decreased hip/knee flexion - right;Antalgic   Ambulation Surface Level   Gait velocity 3.95 ft/sec                   [facility] Adult PT Treatment/Exercise - 07/12/15 1426    Ambulation/Gait   Gait Comments Pt ambulates with flexed posture and leading with head.  Pt educated on lengthening torso and posture   Posture/Postural Control   Posture/Postural Control Postural limitations   Postural Limitations Rounded Shoulders;Forward head;Flexed trunk   Knee/Hip Exercises: Supine   Quad Sets Strengthening;2 sets;Right;10 reps   Short Arc [employer] Strengthening;1 set;10 reps   Short Arc Quad Sets Limitations decreased ext  lag of +25   Heel Slides AROM;Right;2 sets;10 reps   Heel Slides Limitations 110 Right   Straight Leg Raises Right;2 sets;10 reps   Straight Leg Raises Limitations VC for motor control   Moist Heat Therapy   Number Minutes Moist Heat 10 Minutes   Moist Heat Location Knee  right   Manual Therapy   Manual Therapy Joint mobilization   Joint Mobilization patellar mob and knee extension mob PA maitland grade 3   Pt with increased discomfort                 PT Education - 07/12/15 1443    Education provided Yes   Education Details POC ,           PT Short Term Goals - 07/12/15 1525    PT SHORT TERM GOAL #1   Title "Independent with initial HEP   Time 4   Period Weeks   Status New   PT SHORT TERM GOAL #2   Title "Report pain decrease at rest from  7  /10 to  4 /10.   Time 4   Period Weeks   Status  New   PT SHORT TERM GOAL #3   Title "Demonstrate and verbalize understanding of condition management including RICE, positioning, use of A.D., HEP.    Time 4   Period Weeks   Status New   PT SHORT TERM GOAL #4   Title Pt to demonstrate proper posture for walking and carryover to each visit ( decrease flexed posture for gait)   Time 4   Period Weeks   Status New           PT Long Term Goals - 07-15-15 1511    PT LONG TERM GOAL #1   Title "Pt will be independent with advanced HEP.    Time 8   Period Weeks   Status New   PT LONG TERM GOAL #2   Title Pt will be educated on gym equipment and precautians for Home use and safety of knee.   Time 8   Period Weeks   Status New   PT LONG TERM GOAL #3   Title "Report pain decrease at rest from 4  /10 to 1  /10.   Time 8   Period Weeks   Status New   PT LONG TERM GOAL #4   Title "FOTO will improve from  41%limitation  to 32% limitation    indicating improved functional mobility .    Time 8   Period Weeks   Status New   PT LONG TERM GOAL #5   Title "R knee AAROM ext/flexion will improve to + 9ext to 115 flex degrees for improved mobility for walking and steps   Time 8   Period Weeks   Status New   Additional Long Term Goals   Additional Long Term Goals Yes   PT LONG TERM GOAL #6   Title Pt will be able to sleep at night with 4 hours of uninterrupted sleep and 1/10 pain level or less   Time 8   Period Weeks   Status New               Plan - 07-15-15 1509    Clinical Impression Statement Pt is a  59  year old male s/p   R TKA on    04-30-15  by Dr Rodell Perna  . Pt presents with impairments including pain, decreased motor control , impaired ROM especially in extension(+25)( extension lag +25), , difficulty with walking ( gait veleocity is 3.95 ft/sec while normal is 4/37 ft/sec), stairs,unable to sleep at night due to pain . Pt would benefit from skilled PT for 2 times a  week for 8 weeks to address above impariments and functional limitations and return to pain-free level of function to begin walking program and exercise at gym.   Pt will benefit from skilled therapeutic intervention in order to improve on the following deficits Abnormal gait;Decreased activity tolerance;Decreased mobility;Decreased range of motion;Pain;Improper body mechanics;Postural dysfunction;Increased edema;Hypomobility;Difficulty walking;Decreased scar mobility;Decreased strength   Rehab Potential Good   PT Frequency 2x / week   PT Duration 8 weeks   PT Treatment/Interventions ADLs/Self Care Home Management;Cryotherapy;Electrical Stimulation;Stair training;Gait training;Moist Heat;Functional mobility training;Therapeutic exercise;Therapeutic activities;Neuromuscular re-education;Manual techniques;Taping;Patient/family education;Passive range of motion;Dry needling   PT Next Visit Plan Please use leg press this visit.  MD would like him to know how to use equipment at gym.  Most important is to increase Right knee extension and gait training   PT Home Exercise Plan Knee level 1, gait training          G-Codes - 2015-07-15  1545    Functional Assessment Tool Used FOTO   Functional Limitation Mobility: Walking and moving around   Mobility: Walking and Moving Around Current Status [phone]) At least 40 percent but less than 60 percent impaired, limited or restricted   Mobility: Walking and Moving Around Goal Status [phone]) At least 20 percent but less than 40 percent impaired, limited or restricted       Problem List Patient Active Problem List   Diagnosis Date Noted  . Swelling of right lower extremity 05/15/2015  . Pain due to total right knee replacement 05/15/2015  . History of DVT (deep vein thrombosis) 05/09/2015  . Acute blood loss anemia 05/06/2015  . 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 Left ventricular hypertrophy by electrocardiogram 04/07/2015  . Essential  hypertension, benign 04/01/2014  . Osteoarthritis 04/01/2014  . Knee pain, right 04/01/2014    Voncille Lo, PT 07/12/2015 3:47 PM Phone: [phone] Fax: [facility] [place], [place], [address] Phone: [phone]   Fax:  [phone]

## 2015-07-19 ENCOUNTER — Ambulatory Visit: Payer: Medicare Other | Admitting: Physical Therapy

## 2015-07-19 DIAGNOSIS — R6889 Other general symptoms and signs: Secondary | ICD-10-CM

## 2015-07-19 DIAGNOSIS — M25561 Pain in right knee: Secondary | ICD-10-CM

## 2015-07-19 DIAGNOSIS — M25661 Stiffness of right knee, not elsewhere classified: Secondary | ICD-10-CM

## 2015-07-19 DIAGNOSIS — R29898 Other symptoms and signs involving the musculoskeletal system: Secondary | ICD-10-CM | POA: Diagnosis not present

## 2015-07-19 DIAGNOSIS — R609 Edema, unspecified: Secondary | ICD-10-CM | POA: Diagnosis not present

## 2015-07-19 DIAGNOSIS — R269 Unspecified abnormalities of gait and mobility: Secondary | ICD-10-CM | POA: Diagnosis not present

## 2015-07-19 NOTE — Therapy (Signed)
[place], [place], [address] Phone: [phone]   Fax:  [phone]  Physical Therapy Treatment  Patient Details  Name: Dustin [name] MRN: [id number] Date of Birth: 1956-04-14 Referring Provider:  Marybelle Killings, MD  Encounter Date: 07/19/2015      PT End of Session - 07/19/15 1305    PT Start Time 0935   PT Stop Time 1030   PT Time Calculation (min) 55 min   Activity Tolerance Patient tolerated treatment well;No increased pain   Behavior During Therapy [facility] for tasks assessed/performed      Past Medical History  Diagnosis Date  . History of blood clots     to left arm  . Arthritis     bil knees, left hand  . DVT of upper extremity (deep vein thrombosis)     left brachial vein, 12/2010  . Hypertension   . Shortness of breath dyspnea     with exertion    Past Surgical History  Procedure Laterality Date  . Left  hand   2011    cyst removed.  . Total knee arthroplasty  10/19/2011    Procedure: TOTAL KNEE ARTHROPLASTY;  Surgeon: Sharmon Revere;  Location: [place];  Service: Orthopedics;  Laterality: Left;  . Colonscopy       1 year ago   . Knee arthroplasty Right 04/30/2015    Procedure: COMPUTER ASSISTED TOTAL KNEE ARTHROPLASTY;  Surgeon: Marybelle Killings, MD;  Location: [place];  Service: Orthopedics;  Laterality: Right;    There were no vitals filed for this visit.  Visit Diagnosis:  Pain in right knee  Abnormality of gait  Edema  Decreased ROM of right knee  Activity intolerance      Subjective Assessment - 07/19/15 0953    Subjective Painful 5-6/10 Pain keeps him up at night.  doing his exercises   Currently in Pain? Yes   Pain Score 6    Pain Location Knee   Pain Orientation Anterior   Pain Descriptors / Indicators Aching;Shooting;Sharp   Pain Frequency Intermittent   Aggravating Factors  sleeping, stretching   Pain Relieving Factors meds , ics                          [facility] Adult PT Treatment/Exercise - 07/19/15 0954    Self-Care   Other Self-Care Comments  knee mechanics for straightening.  Also what kinds of pain are harmful and what are painful but not harmful.  DO not'd reviewed.    Knee/Hip Exercises: Stretches   Quad Stretch 10 seconds  10 reps with strap and pad for knee.   Knee/Hip Exercises: Machines for Strengthening   Cybex Leg Press 20,40, 2 legs,10 reps 1 leg.   Knee/Hip Exercises: Supine   Investment banker, operational Sets Limitations with moist heat   Heel Slides AROM;AAROM;Right;2 sets   Heel Slides Limitations 111. PROM,  103 active initially   Patellar Mobs 4 way, stiff   Knee Extension Limitations about 20 degrees, bett post manual, not measured.     Knee/Hip Exercises: Prone   Other Prone Exercises terminal knee extension 10 reps 5 seconds , cues.   Modalities   Modalities Cryotherapy;Moist Heat   Moist Heat Therapy   Number Minutes Moist Heat 20 Minutes  during stretches for exeension, Rt only 10 reps 1 plate   Cryotherapy   Number Minutes Cryotherapy 10 Minutes   Cryotherapy Location  Knee   Type of Cryotherapy --  cold pack   Manual Therapy   Manual therapy comments AA rom  Joint mobilization  RT for flexion   Joint Mobilization patellar                PT Education - 07/19/15 1141    Education provided Yes   Education Details self care   Person(s) Educated Patient   Methods Explanation;Demonstration;Verbal cues   Comprehension Verbalized understanding          PT Short Term Goals - 07/19/15 1308    PT SHORT TERM GOAL #1   Title "Independent with initial HEP   Time 4   Period Weeks   Status On-going   PT SHORT TERM GOAL #2   Title "Report pain decrease at rest from  7 /10 to  4 /10.   Baseline 6   Time 4   Period Weeks   Status On-going   PT SHORT TERM GOAL #3   Title "Demonstrate and verbalize understanding of condition management including RICE,  positioning, use of A.D., HEP.    Time 4   Period Weeks   Status On-going   PT SHORT TERM GOAL #4   Title Pt to demonstrate proper posture for walking and carryover to each visit ( decrease flexed posture for gait)   Baseline improves with instruction   Time 4   Period Weeks   Status On-going           PT Long Term Goals - 07/12/15 1511    PT LONG TERM GOAL #1   Title "Pt will be independent with advanced HEP.    Time 8   Period Weeks   Status New   PT LONG TERM GOAL #2   Title Pt will be educated on gym equipment and precautians for Home use and safety of knee.   Time 8   Period Weeks   Status New   PT LONG TERM GOAL #3   Title "Report pain decrease at rest from 4  /10 to 1  /10.   Time 8   Period Weeks   Status New   PT LONG TERM GOAL #4   Title "FOTO will improve from  41%limitation  to 32% limitation    indicating improved functional mobility .    Time 8   Period Weeks   Status New   PT LONG TERM GOAL #5   Title "R knee AAROM ext/flexion will improve to + 9ext to 115 flex degrees for improved mobility for walking and steps   Time 8   Period Weeks   Status New   Additional Long Term Goals   Additional Long Term Goals Yes   PT LONG TERM GOAL #6   Title Pt will be able to sleep at night with 4 hours of uninterrupted sleep and 1/10 pain level or less   Time 8   Period Weeks   Status New               Plan - 07/19/15 1305    Clinical Impression Statement Knee less tender and warm post exercise prior to cold.  No pain post exercise prior to cold.  Gait improved with increased quad use and control.     PT Next Visit Plan Continue machines for strengthening, gait and knee extension.  Heat helpful with stretches   Consulted and Agree with Plan of Care Patient        Problem List Patient Active Problem List  Diagnosis Date Noted  . Swelling of right lower extremity 05/15/2015  . Pain due to total right knee replacement 05/15/2015  . History of DVT  (deep vein thrombosis) 05/09/2015  . Acute blood loss anemia 05/06/2015  . 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 Left ventricular hypertrophy by electrocardiogram 04/07/2015  . Essential hypertension, benign 04/01/2014  . Osteoarthritis 04/01/2014  . Knee pain, right 04/01/2014    [facility] 07/19/2015, 1:11 PM  [facility] [address] [place], [place], [address] Phone: [phone]   Fax:  [phone]     Melvenia Needles, PTA 07/19/2015 1:11 PM Phone: [phone] Fax: [phone]

## 2015-07-26 ENCOUNTER — Ambulatory Visit: Payer: Medicare Other | Admitting: Physical Therapy

## 2015-07-26 DIAGNOSIS — R609 Edema, unspecified: Secondary | ICD-10-CM

## 2015-07-26 DIAGNOSIS — R29898 Other symptoms and signs involving the musculoskeletal system: Secondary | ICD-10-CM | POA: Diagnosis not present

## 2015-07-26 DIAGNOSIS — M25561 Pain in right knee: Secondary | ICD-10-CM | POA: Diagnosis not present

## 2015-07-26 DIAGNOSIS — R6889 Other general symptoms and signs: Secondary | ICD-10-CM

## 2015-07-26 DIAGNOSIS — M25661 Stiffness of right knee, not elsewhere classified: Secondary | ICD-10-CM

## 2015-07-26 DIAGNOSIS — R269 Unspecified abnormalities of gait and mobility: Secondary | ICD-10-CM | POA: Diagnosis not present

## 2015-07-26 NOTE — Patient Instructions (Addendum)
Gastroc / Heel Cord Stretch - On Step   Stand with heels over edge of stair. Holding rail, lower heels until stretch is felt in calf of legs. Hold 30 sec Repeat _3__ times. Do __2_ times per day.  Copyright  VHI. All rights reserved.  Hamstring: Towel Stretch (Supine)   Lie on back. Loop towel around left foot, hip and knee at 90. Straighten knee and pull foot toward body. Hold _30__ seconds. Relax. Repeat __33_ times. Do ___ times a day. Hamstring: Stretch (Long Sitting)   With left leg straight, tuck opposite foot near groin. Reach down until stretch is felt in back of thigh. Hold _30__ seconds. Relax. Repeat _2_ times. Do __3_ times a day.      Copyright  VHI. All rights reserved.

## 2015-07-26 NOTE — Therapy (Signed)
[place], [place], [address] Phone: [phone]   Fax:  [phone]  Physical Therapy Treatment  Patient Details  Name: Dustin [name] MRN: [id number] Date of Birth: Feb 17, 1956 Referring Darryl Blumenstein:  Marybelle Killings, MD  Encounter Date: 07/26/2015      PT End of Session - 07/26/15 0931    Visit Number 3   Number of Visits 16   Date for PT Re-Evaluation 09/06/15   Authorization Type Medicare/Medicaid   PT Start Time 0931   PT Stop Time 1030   PT Time Calculation (min) 59 min      Past Medical History  Diagnosis Date  . History of blood clots     to left arm  . Arthritis     bil knees, left hand  . DVT of upper extremity (deep vein thrombosis)     left brachial vein, 12/2010  . Hypertension   . Shortness of breath dyspnea     with exertion    Past Surgical History  Procedure Laterality Date  . Left  hand   2011    cyst removed.  . Total knee arthroplasty  10/19/2011    Procedure: TOTAL KNEE ARTHROPLASTY;  Surgeon: Sharmon Revere;  Location: [place];  Service: Orthopedics;  Laterality: Left;  . Colonscopy       1 year ago   . Knee arthroplasty Right 04/30/2015    Procedure: COMPUTER ASSISTED TOTAL KNEE ARTHROPLASTY;  Surgeon: Marybelle Killings, MD;  Location: [place];  Service: Orthopedics;  Laterality: Right;    There were no vitals filed for this visit.  Visit Diagnosis:  Pain in right knee  Abnormality of gait  Edema  Decreased ROM of right knee  Activity intolerance      Subjective Assessment - 07/26/15 0932    Subjective My pain is above my knee.    Currently in Pain? Yes   Pain Score 5   5-6/10   Pain Location Knee   Pain Orientation Anterior;Proximal   Aggravating Factors  sleeping   Pain Relieving Factors meds ice            [facility] PT Assessment - 07/26/15 0001    AROM   Right Knee Extension -18  supine                     [facility] Adult PT  Treatment/Exercise - 07/26/15 0937    Knee/Hip Exercises: Stretches   Active Hamstring Stretch 3 reps;30 seconds   Active Hamstring Stretch Limitations with strap   Passive Hamstring Stretch 1 rep;60 seconds   Gastroc Stretch 3 reps;30 seconds   Gastroc Stretch Limitations on step    Knee/Hip Exercises: Aerobic   Recumbent Bike partial to full revolutions forward and back then Level 1 x 5 minutes   Knee/Hip Exercises: Machines for Strengthening   Cybex Leg Press 20,40, 2 legs,10 reps 1 leg.   Knee/Hip Exercises: Standing   Forward Step Up 10 reps;Hand Hold: 1;Step Height: 6"   Knee/Hip Exercises: Supine   Short Arc Quad Sets Strengthening;1 set;10 reps   Short Arc Quad Sets Limitations -20 lag   Terminal Knee Extension Right;10 reps  with heel prop   Straight Leg Raises 10 reps   Patellar Mobs 4 way, stiff   Knee Extension Limitations -18 post manual   Modalities   Modalities Cryotherapy;Moist Heat   Cryotherapy   Number Minutes Cryotherapy 15 Minutes   Cryotherapy Location Knee  Type of Cryotherapy Ice pack   Manual Therapy   Manual Therapy Joint mobilization   Joint Mobilization patellar mob and knee extension mob PA maitland grade 3   Pt with increased discomfort                 PT Education - 07/26/15 1046    Education provided Yes   Education Details gastroc stretch at step, hamsting stretch supine and long sitting   Person(s) Educated Patient   Methods Explanation;Handout   Comprehension Verbalized understanding          PT Short Term Goals - 07/19/15 1308    PT SHORT TERM GOAL #1   Title "Independent with initial HEP   Time 4   Period Weeks   Status On-going   PT SHORT TERM GOAL #2   Title "Report pain decrease at rest from  7 /10 to  4 /10.   Baseline 6   Time 4   Period Weeks   Status On-going   PT SHORT TERM GOAL #3   Title "Demonstrate and verbalize understanding of condition management including RICE, positioning, use of A.D., HEP.     Time 4   Period Weeks   Status On-going   PT SHORT TERM GOAL #4   Title Pt to demonstrate proper posture for walking and carryover to each visit ( decrease flexed posture for gait)   Baseline improves with instruction   Time 4   Period Weeks   Status On-going           PT Long Term Goals - 07/12/15 1511    PT LONG TERM GOAL #1   Title "Pt will be independent with advanced HEP.    Time 8   Period Weeks   Status New   PT LONG TERM GOAL #2   Title Pt will be educated on gym equipment and precautians for Home use and safety of knee.   Time 8   Period Weeks   Status New   PT LONG TERM GOAL #3   Title "Report pain decrease at rest from 4  /10 to 1  /10.   Time 8   Period Weeks   Status New   PT LONG TERM GOAL #4   Title "FOTO will improve from  41%limitation  to 32% limitation    indicating improved functional mobility .    Time 8   Period Weeks   Status New   PT LONG TERM GOAL #5   Title "R knee AAROM ext/flexion will improve to + 9ext to 115 flex degrees for improved mobility for walking and steps   Time 8   Period Weeks   Status New   Additional Long Term Goals   Additional Long Term Goals Yes   PT LONG TERM GOAL #6   Title Pt will be able to sleep at night with 4 hours of uninterrupted sleep and 1/10 pain level or less   Time 8   Period Weeks   Status New               Plan - 07/26/15 1049    Clinical Impression Statement Instructed pt in hamstring and calf stretches for home as well as terminal knee extension supine and long sitting. Education to pt on importance on working knee into extension throughout out the day.    PT Next Visit Plan Continue machines for strengthening, gait and knee extension.  Heat helpful with stretches; try manual/ rock blade to distal quads.  Problem List Patient Active Problem List   Diagnosis Date Noted  . Swelling of right lower extremity 05/15/2015  . Pain due to total right knee replacement 05/15/2015  .  History of DVT (deep vein thrombosis) 05/09/2015  . Acute blood loss anemia 05/06/2015  . 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 Left ventricular hypertrophy by electrocardiogram 04/07/2015  . Essential hypertension, benign 04/01/2014  . Osteoarthritis 04/01/2014  . Knee pain, right 04/01/2014    Dorene Ar, PTA  07/26/2015, 10:52 AM  [place], [place], [address] Phone: [phone]   Fax:  [phone]

## 2015-07-29 ENCOUNTER — Ambulatory Visit: Payer: Medicare Other | Admitting: Physical Therapy

## 2015-07-29 DIAGNOSIS — M25561 Pain in right knee: Secondary | ICD-10-CM | POA: Diagnosis not present

## 2015-07-29 DIAGNOSIS — R6889 Other general symptoms and signs: Secondary | ICD-10-CM | POA: Diagnosis not present

## 2015-07-29 DIAGNOSIS — M25661 Stiffness of right knee, not elsewhere classified: Secondary | ICD-10-CM

## 2015-07-29 DIAGNOSIS — R269 Unspecified abnormalities of gait and mobility: Secondary | ICD-10-CM

## 2015-07-29 DIAGNOSIS — R609 Edema, unspecified: Secondary | ICD-10-CM

## 2015-07-29 DIAGNOSIS — R29898 Other symptoms and signs involving the musculoskeletal system: Secondary | ICD-10-CM | POA: Diagnosis not present

## 2015-07-29 NOTE — Therapy (Signed)
[place], [place], [address] Phone: [phone]   Fax:  [phone]  Physical Therapy Treatment  Patient Details  Name: Dustin [name] MRN: [id number] Date of Birth: 10/17/1956 Referring Provider:  Marybelle Killings, MD  Encounter Date: 07/29/2015      PT End of Session - 07/29/15 0924    Visit Number 4   Number of Visits 16   Date for PT Re-Evaluation 09/06/15   Authorization Type Medicare/Medicaid   PT Start Time 0920   PT Stop Time 9983   PT Time Calculation (min) 72 min      Past Medical History  Diagnosis Date  . History of blood clots     to left arm  . Arthritis     bil knees, left hand  . DVT of upper extremity (deep vein thrombosis)     left brachial vein, 12/2010  . Hypertension   . Shortness of breath dyspnea     with exertion    Past Surgical History  Procedure Laterality Date  . Left  hand   2011    cyst removed.  . Total knee arthroplasty  10/19/2011    Procedure: TOTAL KNEE ARTHROPLASTY;  Surgeon: Sharmon Revere;  Location: [facility];  Service: Orthopedics;  Laterality: Left;  . Colonscopy       1 year ago   . Knee arthroplasty Right 04/30/2015    Procedure: COMPUTER ASSISTED TOTAL KNEE ARTHROPLASTY;  Surgeon: Marybelle Killings, MD;  Location: [facility];  Service: Orthopedics;  Laterality: Right;    There were no vitals filed for this visit.  Visit Diagnosis:  Pain in right knee  Abnormality of gait  Edema  Decreased ROM of right knee  Activity intolerance      Subjective Assessment - 07/29/15 0923    Subjective Pain is about the same. I might be sleeping a little better   Currently in Pain? Yes   Pain Score 5   5-6/10   Pain Location Knee   Pain Orientation Anterior;Proximal   Pain Descriptors / Indicators Aching  intermittent sharp pains            [facility] PT Assessment - 07/29/15 0953    AROM   Right Knee Extension -20  -17 after manual                      [facility] Adult PT Treatment/Exercise - 07/29/15 0925    Knee/Hip Exercises: Stretches   Active Hamstring Stretch 3 reps;30 seconds   Active Hamstring Stretch Limitations with strap   Passive Hamstring Stretch 1 rep;60 seconds   Knee: Self-Stretch to increase Flexion 3 reps;30 seconds   Knee: Self-Stretch Limitations heel slide with strap assist    Other Knee/Hip Stretches Also seated hamstring stretch with foot on stool and long sitting    Knee/Hip Exercises: Aerobic   Recumbent Bike L1 x 5 minutes   Knee/Hip Exercises: Machines for Strengthening   Cybex Leg Press 20,40, 2 legs,10 reps 1 leg.   Knee/Hip Exercises: Supine   Investment banker, operational Sets Limitations with over pressure   Heel Slides AROM;10 reps   Terminal Knee Extension Right;10 reps  with heel prop   Terminal Knee Extension Limitations and over pressure   Patellar Mobs 4 way, stiff   Knee Extension Limitations -17 best in lon sitting. on stretch   Modalities   Modalities Cryotherapy   Cryotherapy   Number Minutes Cryotherapy  15 Minutes   Cryotherapy Location Knee   Type of Cryotherapy Ice pack   Manual Therapy   Manual Therapy Joint mobilization   Joint Mobilization patellar mob and knee extension mob PA maitland grade 3   Pt with increased discomfort                   PT Short Term Goals - 07/29/15 1041    PT SHORT TERM GOAL #1   Title "Independent with initial HEP   Time 4   Period Weeks   Status Achieved   PT SHORT TERM GOAL #2   Title "Report pain decrease at rest from  7 /10 to  4 /10.   Baseline 6   Time 4   Period Weeks   Status On-going   PT SHORT TERM GOAL #3   Title "Demonstrate and verbalize understanding of condition management including RICE, positioning, use of A.D., HEP.    Time 4   Period Weeks   Status Achieved   PT SHORT TERM GOAL #4   Title Pt to demonstrate proper posture for walking and carryover to each visit ( decrease flexed  posture for gait)   Baseline improves with instruction   Time 4   Period Weeks   Status On-going           PT Long Term Goals - 07/29/15 1042    PT LONG TERM GOAL #1   Title "Pt will be independent with advanced HEP.    Time 8   Period Weeks   Status On-going   PT LONG TERM GOAL #2   Title Pt will be educated on gym equipment and precautians for Home use and safety of knee.   Time 8   Period Weeks   Status On-going   PT LONG TERM GOAL #3   Title "Report pain decrease at rest from 4  /10 to 1  /10.   Time 8   Status On-going   PT LONG TERM GOAL #4   Title "FOTO will improve from  41%limitation  to 32% limitation    indicating improved functional mobility .    Time 8   Period Weeks   Status On-going   PT LONG TERM GOAL #5   Title "R knee AAROM ext/flexion will improve to + 9ext to 115 flex degrees for improved mobility for walking and steps   Time 8   Period Weeks   Status On-going   PT LONG TERM GOAL #6   Title Pt will be able to sleep at night with 4 hours of uninterrupted sleep and 1/10 pain level or less   Time 8   Period Weeks   Status On-going               Plan - 07/29/15 1038    Clinical Impression Statement Pt present with continued lack of knee extension -20 supine at beginning of treatment with best -17 at end of treatment. Increased time spent on extension mobs, terminal knee extension and hamstring stretches this visit. Also instructed pt to perform assisted knee flexion with strap as knee flexion measure at 100 degrees. Increased pain duing jt mobs however pt repors no pain before ice application.   PT Next Visit Plan Continue machines for strengthening, gait and knee extension.  Heat helpful with stretches; try manual/ rock blade to distal quads.         Problem List Patient Active Problem List   Diagnosis Date Noted  . Swelling of right  lower extremity 05/15/2015  . Pain due to total right knee replacement 05/15/2015  . History of DVT  (deep vein thrombosis) 05/09/2015  . Acute blood loss anemia 05/06/2015  . 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 Left ventricular hypertrophy by electrocardiogram 04/07/2015  . Essential hypertension, benign 04/01/2014  . Osteoarthritis 04/01/2014  . Knee pain, right 04/01/2014    Dorene Ar, PTA 07/29/2015, 11:03 AM  [place], [place], [address] Phone: [phone]   Fax:  [phone]

## 2015-08-02 ENCOUNTER — Ambulatory Visit: Payer: Medicare Other | Attending: Orthopaedic Surgery | Admitting: Physical Therapy

## 2015-08-02 DIAGNOSIS — R29898 Other symptoms and signs involving the musculoskeletal system: Secondary | ICD-10-CM | POA: Diagnosis not present

## 2015-08-02 DIAGNOSIS — R600 Localized edema: Secondary | ICD-10-CM | POA: Diagnosis not present

## 2015-08-02 DIAGNOSIS — R601 Generalized edema: Secondary | ICD-10-CM | POA: Insufficient documentation

## 2015-08-02 DIAGNOSIS — R6889 Other general symptoms and signs: Secondary | ICD-10-CM | POA: Insufficient documentation

## 2015-08-02 DIAGNOSIS — R269 Unspecified abnormalities of gait and mobility: Secondary | ICD-10-CM | POA: Insufficient documentation

## 2015-08-02 DIAGNOSIS — M25561 Pain in right knee: Secondary | ICD-10-CM | POA: Insufficient documentation

## 2015-08-02 DIAGNOSIS — M25661 Stiffness of right knee, not elsewhere classified: Secondary | ICD-10-CM

## 2015-08-02 NOTE — Therapy (Signed)
[place], [place], [address] Phone: [phone]   Fax:  [phone]  Physical Therapy Treatment  Patient Details  Name: Dustin [name] MRN: [id number] Date of Birth: 05-31-1956 Referring Provider:  Marybelle Killings, MD  Encounter Date: 08/02/2015      PT End of Session - 08/02/15 1026    Visit Number 5   Number of Visits 16   Date for PT Re-Evaluation 09/06/15   PT Start Time 0934   PT Stop Time 1030   PT Time Calculation (min) 56 min      Past Medical History  Diagnosis Date  . History of blood clots     to left arm  . Arthritis     bil knees, left hand  . DVT of upper extremity (deep vein thrombosis)     left brachial vein, 12/2010  . Hypertension   . Shortness of breath dyspnea     with exertion    Past Surgical History  Procedure Laterality Date  . Left  hand   2011    cyst removed.  . Total knee arthroplasty  10/19/2011    Procedure: TOTAL KNEE ARTHROPLASTY;  Surgeon: Sharmon Revere;  Location: [facility];  Service: Orthopedics;  Laterality: Left;  . Colonscopy       1 year ago   . Knee arthroplasty Right 04/30/2015    Procedure: COMPUTER ASSISTED TOTAL KNEE ARTHROPLASTY;  Surgeon: Marybelle Killings, MD;  Location: [facility];  Service: Orthopedics;  Laterality: Right;    There were no vitals filed for this visit.  Visit Diagnosis:  Pain in right knee  Abnormality of gait  Localized edema  Decreased ROM of right knee  Activity intolerance          [facility] PT Assessment - 08/02/15 0954    ROM / Strength   AROM / PROM / Strength PROM   AROM   Right Knee Extension -16  -15 after therex   Right Knee Flexion 104  105 after therex   PROM   PROM Assessment Site Knee   Right/Left Knee Right   Right Knee Flexion 106                     [facility] Adult PT Treatment/Exercise - 08/02/15 0001    Knee/Hip Exercises: Stretches   Knee: Self-Stretch to increase Flexion 3 reps;30  seconds   Knee: Self-Stretch Limitations heel slide with strap assist    Knee/Hip Exercises: Aerobic   Recumbent Bike L2 x 10 minutes   Knee/Hip Exercises: Machines for Strengthening   Cybex Knee Extension 1 plate x 15 bilateral, single leg x 10   Cybex Knee Flexion 3 plates bilateral single leg 2 plates x 20 each   Cybex Leg Press 20,40, 2 legs,10 reps 1 leg.   Knee/Hip Exercises: Supine   Patellar Mobs 4 way, stiff   Knee/Hip Exercises: Prone   Other Prone Exercises prone knee flexion stretch 3x 30 with strap   Cryotherapy   Number Minutes Cryotherapy 15 Minutes   Cryotherapy Location Knee   Type of Cryotherapy Ice pack   Manual Therapy   Manual Therapy Joint mobilization   Joint Mobilization patellar mob and knee flexion mob  [place]it[place] grade 3   Pt with increased discomfort                   PT Short Term Goals - 08/02/15 0947    PT SHORT TERM GOAL #  1   Title "Independent with initial HEP   Time 4   Period Weeks   Status Achieved   PT SHORT TERM GOAL #2   Title "Report pain decrease at rest from  7 /10 to  4 /10.   Baseline 4   Time 4   Period Weeks   Status Achieved   PT SHORT TERM GOAL #3   Title "Demonstrate and verbalize understanding of condition management including RICE, positioning, use of A.D., HEP.    Time 4   Period Weeks   Status Achieved   PT SHORT TERM GOAL #4   Title Pt to demonstrate proper posture for walking and carryover to each visit ( decrease flexed posture for gait)   Time 4   Period Weeks   Status Achieved           PT Long Term Goals - 07/29/15 1042    PT LONG TERM GOAL #1   Title "Pt will be independent with advanced HEP.    Time 8   Period Weeks   Status On-going   PT LONG TERM GOAL #2   Title Pt will be educated on gym equipment and precautians for Home use and safety of knee.   Time 8   Period Weeks   Status On-going   PT LONG TERM GOAL #3   Title "Report pain decrease at rest from 4  /10 to 1  /10.   Time 8    Status On-going   PT LONG TERM GOAL #4   Title "FOTO will improve from  41%limitation  to 32% limitation    indicating improved functional mobility .    Time 8   Period Weeks   Status On-going   PT LONG TERM GOAL #5   Title "R knee AAROM ext/flexion will improve to + 9ext to 115 flex degrees for improved mobility for walking and steps   Time 8   Period Weeks   Status On-going   PT LONG TERM GOAL #6   Title Pt will be able to sleep at night with 4 hours of uninterrupted sleep and 1/10 pain level or less   Time 8   Period Weeks   Status On-going               Plan - 08/02/15 1022    Clinical Impression Statement Resting pain decreased to 4/10. All stgs MET. Pt reports most difficulty with sleeping pain. His ROM is slowly improving. Introduced pt to knee flexion and extension machine today with increased ROM following treatment.    PT Next Visit Plan Continue machines for strengthening, gait and knee extension.  Heat helpful with stretches; try manual/ rock blade to distal quads; try closed chain        Problem List Patient Active Problem List   Diagnosis Date Noted  . Swelling of right lower extremity 05/15/2015  . Pain due to total right knee replacement ([facility]) 05/15/2015  . History of DVT (deep vein thrombosis) 05/09/2015  . Acute blood loss anemia 05/06/2015  . 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 Left ventricular hypertrophy by electrocardiogram 04/07/2015  . Essential hypertension, benign 04/01/2014  . Osteoarthritis 04/01/2014  . Knee pain, right 04/01/2014    Dorene Ar, PTA 08/02/2015, 10:27 AM  [place], [place], [address] Phone: [phone]   Fax:  [phone]

## 2015-08-03 DIAGNOSIS — M25561 Pain in right knee: Secondary | ICD-10-CM | POA: Diagnosis not present

## 2015-08-03 DIAGNOSIS — M1712 Unilateral primary osteoarthritis, left knee: Secondary | ICD-10-CM | POA: Diagnosis not present

## 2015-08-03 DIAGNOSIS — M1711 Unilateral primary osteoarthritis, right knee: Secondary | ICD-10-CM | POA: Diagnosis not present

## 2015-08-05 ENCOUNTER — Ambulatory Visit: Payer: Medicare Other | Admitting: Physical Therapy

## 2015-08-05 DIAGNOSIS — M25561 Pain in right knee: Secondary | ICD-10-CM | POA: Diagnosis not present

## 2015-08-05 DIAGNOSIS — R600 Localized edema: Secondary | ICD-10-CM | POA: Diagnosis not present

## 2015-08-05 DIAGNOSIS — R6889 Other general symptoms and signs: Secondary | ICD-10-CM | POA: Diagnosis not present

## 2015-08-05 DIAGNOSIS — R269 Unspecified abnormalities of gait and mobility: Secondary | ICD-10-CM | POA: Diagnosis not present

## 2015-08-05 DIAGNOSIS — R29898 Other symptoms and signs involving the musculoskeletal system: Secondary | ICD-10-CM | POA: Diagnosis not present

## 2015-08-05 DIAGNOSIS — R601 Generalized edema: Secondary | ICD-10-CM | POA: Diagnosis not present

## 2015-08-05 DIAGNOSIS — M25661 Stiffness of right knee, not elsewhere classified: Secondary | ICD-10-CM

## 2015-08-05 NOTE — Therapy (Signed)
[place], [place], [address] Phone: [phone]   Fax:  [phone]  Physical Therapy Treatment  Patient Details  Name: Dustin [name] MRN: [id number] Date of Birth: 03-02-1956 Referring Provider:  Marybelle Killings, MD  Encounter Date: 08/05/2015      PT End of Session - 08/05/15 1109    Visit Number 6   Number of Visits 16   Date for PT Re-Evaluation 09/06/15   Authorization Type Medicare/Medicaid   Authorization Time Period 09/06/15   PT Start Time 0932   PT Stop Time 1023   PT Time Calculation (min) 51 min   Activity Tolerance Patient tolerated treatment well;No increased pain   Behavior During Therapy [facility] for tasks assessed/performed      Past Medical History  Diagnosis Date  . History of blood clots     to left arm  . Arthritis     bil knees, left hand  . DVT of upper extremity (deep vein thrombosis)     left brachial vein, 12/2010  . Hypertension   . Shortness of breath dyspnea     with exertion    Past Surgical History  Procedure Laterality Date  . Left  hand   2011    cyst removed.  . Total knee arthroplasty  10/19/2011    Procedure: TOTAL KNEE ARTHROPLASTY;  Surgeon: Sharmon Revere;  Location: [facility];  Service: Orthopedics;  Laterality: Left;  . Colonscopy       1 year ago   . Knee arthroplasty Right 04/30/2015    Procedure: COMPUTER ASSISTED TOTAL KNEE ARTHROPLASTY;  Surgeon: Marybelle Killings, MD;  Location: [facility];  Service: Orthopedics;  Laterality: Right;    There were no vitals filed for this visit.  Visit Diagnosis:  Pain in right knee  Abnormality of gait  Localized edema  Decreased ROM of right knee  Activity intolerance      Subjective Assessment - 08/05/15 0934    Subjective Knee always hurts but not that bad today.   Pertinent History R TKR with Rodell Perna  04-30-15   Limitations Standing;Walking;House hold activities   How long can you stand comfortably? 30 min    How long can you walk comfortably? 30 min   Patient Stated Goals I want to not limp,  sleep at night, do yard work,  walk for exercise ( 1 mile)   Currently in Pain? Yes   Pain Score 6    Pain Location Knee   Pain Orientation Right;Anterior;Proximal   Pain Descriptors / Indicators Aching  with occasional sharp pains   Pain Type Chronic pain;Surgical pain   Pain Onset More than a month ago   Pain Frequency Constant   Aggravating Factors  sleeping   Pain Relieving Factors meds, ice                         [facility] Adult PT Treatment/Exercise - 08/05/15 0938    Knee/Hip Exercises: Aerobic   Nustep Level 6 x 8 min   Knee/Hip Exercises: Machines for Strengthening   Cybex Knee Extension 2 plates 2x10 bil; RLE 1 plate 2x10   Cybex Knee Flexion 2 plates 2x10 bil and RLE only   Cybex Leg Press 40# x10, 50# x10 bil; 20# 2x10 RLE only   Cryotherapy   Number Minutes Cryotherapy 10 Minutes   Cryotherapy Location Knee   Type of Cryotherapy Ice pack   Manual Therapy  Manual Therapy Joint mobilization   Joint Mobilization patellar mob and knee extension mob PA grade 2-3                  PT Short Term Goals - 08/02/15 0947    PT SHORT TERM GOAL #1   Title "Independent with initial HEP   Time 4   Period Weeks   Status Achieved   PT SHORT TERM GOAL #2   Title "Report pain decrease at rest from  7 /10 to  4 /10.   Baseline 4   Time 4   Period Weeks   Status Achieved   PT SHORT TERM GOAL #3   Title "Demonstrate and verbalize understanding of condition management including RICE, positioning, use of A.D., HEP.    Time 4   Period Weeks   Status Achieved   PT SHORT TERM GOAL #4   Title Pt to demonstrate proper posture for walking and carryover to each visit ( decrease flexed posture for gait)   Time 4   Period Weeks   Status Achieved           PT Long Term Goals - 07/29/15 1042    PT LONG TERM GOAL #1   Title "Pt will be independent with advanced HEP.     Time 8   Period Weeks   Status On-going   PT LONG TERM GOAL #2   Title Pt will be educated on gym equipment and precautians for Home use and safety of knee.   Time 8   Period Weeks   Status On-going   PT LONG TERM GOAL #3   Title "Report pain decrease at rest from 4  /10 to 1  /10.   Time 8   Status On-going   PT LONG TERM GOAL #4   Title "FOTO will improve from  41%limitation  to 32% limitation    indicating improved functional mobility .    Time 8   Period Weeks   Status On-going   PT LONG TERM GOAL #5   Title "R knee AAROM ext/flexion will improve to + 9ext to 115 flex degrees for improved mobility for walking and steps   Time 8   Period Weeks   Status On-going   PT LONG TERM GOAL #6   Title Pt will be able to sleep at night with 4 hours of uninterrupted sleep and 1/10 pain level or less   Time 8   Period Weeks   Status On-going               Plan - 08/05/15 1110    Clinical Impression Statement Pt tolerated gym equipment well without c/o increased pain.  Continues to have discomfort with manual therapy for ROM   PT Next Visit Plan Continue machines for strengthening, gait and knee extension.  Heat helpful with stretches; try manual/ rock blade to distal quads; try closed chain   Consulted and Agree with Plan of Care Patient        Problem List Patient Active Problem List   Diagnosis Date Noted  . Swelling of right lower extremity 05/15/2015  . Pain due to total right knee replacement ([place]) 05/15/2015  . History of DVT (deep vein thrombosis) 05/09/2015  . Acute blood loss anemia 05/06/2015  . 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 Left ventricular hypertrophy by electrocardiogram 04/07/2015  . Essential hypertension, benign 04/01/2014  . Osteoarthritis 04/01/2014  . Knee pain, right 04/01/2014   Laureen Abrahams,  PT, DPT 08/05/2015 11:15 AM  [facility] [address] [place], [place], [address] Phone: [phone]   Fax:  [phone]

## 2015-08-09 ENCOUNTER — Ambulatory Visit: Payer: Medicare Other | Admitting: Physical Therapy

## 2015-08-09 DIAGNOSIS — R600 Localized edema: Secondary | ICD-10-CM | POA: Diagnosis not present

## 2015-08-09 DIAGNOSIS — R6889 Other general symptoms and signs: Secondary | ICD-10-CM

## 2015-08-09 DIAGNOSIS — R269 Unspecified abnormalities of gait and mobility: Secondary | ICD-10-CM | POA: Diagnosis not present

## 2015-08-09 DIAGNOSIS — M25561 Pain in right knee: Secondary | ICD-10-CM

## 2015-08-09 DIAGNOSIS — M25661 Stiffness of right knee, not elsewhere classified: Secondary | ICD-10-CM

## 2015-08-09 DIAGNOSIS — R29898 Other symptoms and signs involving the musculoskeletal system: Secondary | ICD-10-CM | POA: Diagnosis not present

## 2015-08-09 DIAGNOSIS — R601 Generalized edema: Secondary | ICD-10-CM | POA: Diagnosis not present

## 2015-08-09 NOTE — Patient Instructions (Addendum)
Knee / Archie Balboa on stomach, knees together. Grab one ankle with same side hand. Use towel if needed to reach. Gently pull foot toward buttock. Hold __30__ seconds. Repeat with other leg. Repeat __3__ times. Do __2__ sessions per day.  Copyright  VHI. All rights reserved.  Terminal Knee Extension (Standing)    Facing anchor with right knee slightly bent and tubing just above knee, gently pull knee back straight. Do not overextend knee. HOLD 5 sec Repeat __10__ times per set. Do ___2_ sets per session. Do __2__ sessions per day.  http://orth.exer.us/666   Copyright  VHI. All rights reserved.

## 2015-08-09 NOTE — Therapy (Signed)
[place], [place], [address] Phone: [phone]   Fax:  [phone]  Physical Therapy Treatment  Patient Details  Name: Dustin [name] MRN: [id number] Date of Birth: 04-Dec-1955 Referring Provider:  Marybelle Killings, MD  Encounter Date: 08/09/2015      PT End of Session - 08/09/15 1038    Visit Number 7   Number of Visits 16   Date for PT Re-Evaluation 09/06/15   Authorization Type Medicare/Medicaid   PT Start Time 0930   PT Stop Time 1025   PT Time Calculation (min) 55 min      Past Medical History  Diagnosis Date  . History of blood clots     to left arm  . Arthritis     bil knees, left hand  . DVT of upper extremity (deep vein thrombosis)     left brachial vein, 12/2010  . Hypertension   . Shortness of breath dyspnea     with exertion    Past Surgical History  Procedure Laterality Date  . Left  hand   2011    cyst removed.  . Total knee arthroplasty  10/19/2011    Procedure: TOTAL KNEE ARTHROPLASTY;  Surgeon: Sharmon Revere;  Location: [facility];  Service: Orthopedics;  Laterality: Left;  . Colonscopy       1 year ago   . Knee arthroplasty Right 04/30/2015    Procedure: COMPUTER ASSISTED TOTAL KNEE ARTHROPLASTY;  Surgeon: Marybelle Killings, MD;  Location: [facility];  Service: Orthopedics;  Laterality: Right;    There were no vitals filed for this visit.  Visit Diagnosis:  Pain in right knee  Abnormality of gait  Decreased ROM of right knee  Activity intolerance  Localized edema      Subjective Assessment - 08/09/15 1039    Currently in Pain? No/denies            [facility] PT Assessment - 08/09/15 1002    AROM   Right Knee Extension -16  , -14 after treatment   Right Knee Flexion 104  supine, 108 after prone knee stretch                     [facility] Adult PT Treatment/Exercise - 08/09/15 0939    Ambulation/Gait   Stairs Yes   Stairs Assistance 7: Independent   Stair  Management Technique Alternating pattern   Number of Stairs 18   Height of Stairs 6   Knee/Hip Exercises: Stretches   Knee: Self-Stretch to increase Flexion 3 reps;30 seconds   Knee: Self-Stretch Limitations prone with strap    Knee/Hip Exercises: Aerobic   Recumbent Bike L2 x 7 minutes   Knee/Hip Exercises: Machines for Strengthening   Cybex Knee Extension 2 plates 2x10 bil; RLE 1 plate 2x10   Cybex Knee Flexion 3 plates bil, 2 plates R only    Cybex Leg Press 2 plates, 3 plates x 15 each    Knee/Hip Exercises: Standing   Lateral Step Up 15 reps;Hand Hold: 1;Step Height: 4"   Step Down 15 reps;Hand Hold: 1;Step Height: 4"   Rebounder 8 toss best with right SLS   Other Standing Knee Exercises TKE with blue band 3 way x 10 each, TKE with ball on wall x 15.    Other Standing Knee Exercises resisted gait 2 plates forward and back 3 passes each   Cryotherapy   Number Minutes Cryotherapy 10 Minutes   Cryotherapy Location Knee  Type of Cryotherapy Ice pack   Manual Therapy   Manual Therapy Joint mobilization   Joint Mobilization patellar mob and knee extension mob PA grade 2-3                PT Education - 08/09/15 1027    Education provided Yes   Education Details Terminal knee extension (towel/ball behoind knee) Prone knee flexion stretch with strap    Person(s) Educated Patient   Methods Explanation;Handout   Comprehension Verbalized understanding          PT Short Term Goals - 08/02/15 0947    PT SHORT TERM GOAL #1   Title "Independent with initial HEP   Time 4   Period Weeks   Status Achieved   PT SHORT TERM GOAL #2   Title "Report pain decrease at rest from  7 /10 to  4 /10.   Baseline 4   Time 4   Period Weeks   Status Achieved   PT SHORT TERM GOAL #3   Title "Demonstrate and verbalize understanding of condition management including RICE, positioning, use of A.D., HEP.    Time 4   Period Weeks   Status Achieved   PT SHORT TERM GOAL #4   Title Pt to  demonstrate proper posture for walking and carryover to each visit ( decrease flexed posture for gait)   Time 4   Period Weeks   Status Achieved           PT Long Term Goals - 08/09/15 1040    PT LONG TERM GOAL #1   Title "Pt will be independent with advanced HEP.    Time 8   Period Weeks   Status On-going   PT LONG TERM GOAL #2   Title Pt will be educated on gym equipment and precautians for Home use and safety of knee.   Time 8   Period Weeks   Status Achieved   PT LONG TERM GOAL #3   Title "Report pain decrease at rest from 4  /10 to 1  /10.   Time 8   Period Weeks   Status Achieved   PT LONG TERM GOAL #4   Title "FOTO will improve from  41%limitation  to 32% limitation    indicating improved functional mobility .    Time 8   Status Unable to assess   PT LONG TERM GOAL #5   Title "R knee AAROM ext/flexion will improve to + 9ext to 115 flex degrees for improved mobility for walking and steps   Time 8   Period Weeks   Status On-going   PT LONG TERM GOAL #6   Title Pt will be able to sleep at night with 4 hours of uninterrupted sleep and 1/10 pain level or less   Time 8   Period Weeks   Status Unable to assess               Plan - 08/09/15 1036    Clinical Impression Statement Pt reports no pain today or over the weekend. Pt continues to be limited in his ROM. Today his AROM was improved at end of treatment after manual, prone knee flexion stretching, extenson mobs and Terminal knee extension (TKE)  exercises. Added TKE and prone knee flexion stretch to HEP. LTG# 2, #3 Met.    PT Next Visit Plan continue machines and closed chain for strengthening. Review ROM HEP from this visit and remeasure ROM        Problem  List Patient Active Problem List   Diagnosis Date Noted  . Swelling of right lower extremity 05/15/2015  . Pain due to total right knee replacement ([place]) 05/15/2015  . History of DVT (deep vein thrombosis) 05/09/2015  . Acute blood loss anemia  05/06/2015  . 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 Left ventricular hypertrophy by electrocardiogram 04/07/2015  . Essential hypertension, benign 04/01/2014  . Osteoarthritis 04/01/2014  . Knee pain, right 04/01/2014    Dorene Ar , PTA  08/09/2015, 10:42 AM  [place], [place], [address] Phone: [phone]   Fax:  [phone]

## 2015-08-12 ENCOUNTER — Ambulatory Visit: Payer: Medicare Other | Admitting: Physical Therapy

## 2015-08-12 DIAGNOSIS — M25661 Stiffness of right knee, not elsewhere classified: Secondary | ICD-10-CM

## 2015-08-12 DIAGNOSIS — R6889 Other general symptoms and signs: Secondary | ICD-10-CM

## 2015-08-12 DIAGNOSIS — M25561 Pain in right knee: Secondary | ICD-10-CM

## 2015-08-12 DIAGNOSIS — R269 Unspecified abnormalities of gait and mobility: Secondary | ICD-10-CM

## 2015-08-12 DIAGNOSIS — R600 Localized edema: Secondary | ICD-10-CM | POA: Diagnosis not present

## 2015-08-12 DIAGNOSIS — R29898 Other symptoms and signs involving the musculoskeletal system: Secondary | ICD-10-CM | POA: Diagnosis not present

## 2015-08-12 DIAGNOSIS — R601 Generalized edema: Secondary | ICD-10-CM | POA: Diagnosis not present

## 2015-08-12 NOTE — Patient Instructions (Signed)
Knee Extension Mobilization: Hang (Prone)    With table supporting thighs, place _2-3___ pound weight on right ankle. Hold __2__ minutes. Repeat __1__ times per set. Do ___1_ sets per session. Do _2___ sessions per day.  http://orth.exer.us/722   Copyright  VHI. All rights reserved.

## 2015-08-12 NOTE — Therapy (Signed)
[place], [place], [address] Phone: [phone]   Fax:  [phone]  Physical Therapy Treatment  Patient Details  Name: Dustin [name] MRN: [id number] Date of Birth: 22-Oct-1956 Referring Provider:  Marybelle Killings, MD  Encounter Date: 08/12/2015      PT End of Session - 08/12/15 0929    Visit Number 8   Number of Visits 16   Date for PT Re-Evaluation 09/06/15   Authorization Type Medicare/Medicaid   PT Start Time 0927   PT Stop Time 1030   PT Time Calculation (min) 63 min      Past Medical History  Diagnosis Date  . History of blood clots     to left arm  . Arthritis     bil knees, left hand  . DVT of upper extremity (deep vein thrombosis)     left brachial vein, 12/2010  . Hypertension   . Shortness of breath dyspnea     with exertion    Past Surgical History  Procedure Laterality Date  . Left  hand   2011    cyst removed.  . Total knee arthroplasty  10/19/2011    Procedure: TOTAL KNEE ARTHROPLASTY;  Surgeon: Sharmon Revere;  Location: [place];  Service: Orthopedics;  Laterality: Left;  . Colonscopy       1 year ago   . Knee arthroplasty Right 04/30/2015    Procedure: COMPUTER ASSISTED TOTAL KNEE ARTHROPLASTY;  Surgeon: Marybelle Killings, MD;  Location: [facility];  Service: Orthopedics;  Laterality: Right;    There were no vitals filed for this visit.  Visit Diagnosis:  Pain in right knee  Abnormality of gait  Decreased ROM of right knee  Activity intolerance  Localized edema      Subjective Assessment - 08/12/15 0927    Currently in Pain? Yes   Pain Score --  Moderate   Pain Orientation Right;Anterior;Proximal   Pain Descriptors / Indicators Aching   Pain Frequency Intermittent   Aggravating Factors  stretching into flexion in prone with strap   Pain Relieving Factors meds ice            [facility] PT Assessment - 08/12/15 0937    AROM   Right Knee Extension -14  -12 end of  session   Right Knee Flexion 105   PROM   Right Knee Flexion 110                     [facility] Adult PT Treatment/Exercise - 08/12/15 0929    Knee/Hip Exercises: Stretches   Knee: Self-Stretch to increase Flexion 3 reps;30 seconds   Knee: Self-Stretch Limitations prone with strap    Other Knee/Hip Stretches Passive knee flexion x 2   Knee/Hip Exercises: Aerobic   Recumbent Bike L2 x 7 minutes   Knee/Hip Exercises: Machines for Strengthening   Cybex Knee Extension 1 plate x 10 bilx 10 right only   Cybex Knee Flexion 3 plates bil, 2 plates R only    Knee/Hip Exercises: Supine   Heel Slides AAROM;Right   Heel Slides Limitations 3 x 30 with strap   Terminal Knee Extension Right;20 reps  with heel prop   Terminal Knee Extension Limitations and over pressure   Knee Extension Limitations -12 best after treatment   Knee/Hip Exercises: Prone   Hamstring Curl 2 sets;10 reps   Prone Knee Hang 2 minutes;Weights  x 2   Prone Knee Hang Weights (lbs) 3#  Other Prone Exercises prone knee flexion stretch 3x 30 with strap   Manual Therapy   Manual Therapy Soft tissue mobilization   Joint Mobilization patellar mob and knee flexion mob prone grade 2-3 2 bouts 30 seconds   Soft tissue mobilization Rock blade to distal quad and hamstrings followed by stretching, joint mobs and PROM                 PT Education - 08/12/15 1033    Education provided Yes   Education Details Prone knee hang   Person(s) Educated Patient   Methods Explanation;Handout   Comprehension Verbalized understanding          PT Short Term Goals - 08/02/15 0947    PT SHORT TERM GOAL #1   Title "Independent with initial HEP   Time 4   Period Weeks   Status Achieved   PT SHORT TERM GOAL #2   Title "Report pain decrease at rest from  7 /10 to  4 /10.   Baseline 4   Time 4   Period Weeks   Status Achieved   PT SHORT TERM GOAL #3   Title "Demonstrate and verbalize understanding of condition  management including RICE, positioning, use of A.D., HEP.    Time 4   Period Weeks   Status Achieved   PT SHORT TERM GOAL #4   Title Pt to demonstrate proper posture for walking and carryover to each visit ( decrease flexed posture for gait)   Time 4   Period Weeks   Status Achieved           PT Long Term Goals - 08/09/15 1040    PT LONG TERM GOAL #1   Title "Pt will be independent with advanced HEP.    Time 8   Period Weeks   Status On-going   PT LONG TERM GOAL #2   Title Pt will be educated on gym equipment and precautians for Home use and safety of knee.   Time 8   Period Weeks   Status Achieved   PT LONG TERM GOAL #3   Title "Report pain decrease at rest from 4  /10 to 1  /10.   Time 8   Period Weeks   Status Achieved   PT LONG TERM GOAL #4   Title "FOTO will improve from  41%limitation  to 32% limitation    indicating improved functional mobility .    Time 8   Status Unable to assess   PT LONG TERM GOAL #5   Title "R knee AAROM ext/flexion will improve to + 9ext to 115 flex degrees for improved mobility for walking and steps   Time 8   Period Weeks   Status On-going   PT LONG TERM GOAL #6   Title Pt will be able to sleep at night with 4 hours of uninterrupted sleep and 1/10 pain level or less   Time 8   Period Weeks   Status Unable to assess               Plan - 08/12/15 1035    Clinical Impression Statement Pt is making improvements slowly in AROM. Focus today on soft tissue mobilization, mobs followed by PROM, AAROM exercises to increase right knee AROM. AROM improved to 12-108 post treatment. Added Prone Knee Hang to HEP.    PT Next Visit Plan continue machines and closed chain for strengthening.Prone Knee Hang and prone flexion stretch with strap. SLS activities.  Problem List Patient Active Problem List   Diagnosis Date Noted  . Swelling of right lower extremity 05/15/2015  . Pain due to total right knee replacement ([place]) 05/15/2015   . History of DVT (deep vein thrombosis) 05/09/2015  . Acute blood loss anemia 05/06/2015  . 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 Left ventricular hypertrophy by electrocardiogram 04/07/2015  . Essential hypertension, benign 04/01/2014  . Osteoarthritis 04/01/2014  . Knee pain, right 04/01/2014    Dorene Ar, PTA 08/12/2015, 10:39 AM  [place], [place], [address] Phone: [phone]   Fax:  [phone]

## 2015-08-16 ENCOUNTER — Ambulatory Visit: Payer: Medicare Other | Admitting: Physical Therapy

## 2015-08-16 DIAGNOSIS — M25561 Pain in right knee: Secondary | ICD-10-CM

## 2015-08-16 DIAGNOSIS — R6889 Other general symptoms and signs: Secondary | ICD-10-CM | POA: Diagnosis not present

## 2015-08-16 DIAGNOSIS — R269 Unspecified abnormalities of gait and mobility: Secondary | ICD-10-CM | POA: Diagnosis not present

## 2015-08-16 DIAGNOSIS — R29898 Other symptoms and signs involving the musculoskeletal system: Secondary | ICD-10-CM | POA: Diagnosis not present

## 2015-08-16 DIAGNOSIS — R601 Generalized edema: Secondary | ICD-10-CM | POA: Diagnosis not present

## 2015-08-16 DIAGNOSIS — R600 Localized edema: Secondary | ICD-10-CM | POA: Diagnosis not present

## 2015-08-16 DIAGNOSIS — M25661 Stiffness of right knee, not elsewhere classified: Secondary | ICD-10-CM

## 2015-08-16 NOTE — Therapy (Signed)
[place], [place], [address] Phone: [phone]   Fax:  [phone]  Physical Therapy Treatment  Patient Details  Name: LABARRON DURNIN MRN: [id number] Date of Birth: 1956/09/13 No Data Recorded  Encounter Date: 08/16/2015      PT End of Session - 08/16/15 0938    Visit Number 9   Number of Visits 16   Date for PT Re-Evaluation 09/06/15   Authorization Type Medicare/Medicaid   PT Start Time 0930   PT Stop Time 1030   PT Time Calculation (min) 60 min      Past Medical History  Diagnosis Date  . History of blood clots     to left arm  . Arthritis     bil knees, left hand  . DVT of upper extremity (deep vein thrombosis)     left brachial vein, 12/2010  . Hypertension   . Shortness of breath dyspnea     with exertion    Past Surgical History  Procedure Laterality Date  . Left  hand   2011    cyst removed.  . Total knee arthroplasty  10/19/2011    Procedure: TOTAL KNEE ARTHROPLASTY;  Surgeon: Sharmon Revere;  Location: [facility];  Service: Orthopedics;  Laterality: Left;  . Colonscopy       1 year ago   . Knee arthroplasty Right 04/30/2015    Procedure: COMPUTER ASSISTED TOTAL KNEE ARTHROPLASTY;  Surgeon: Marybelle Killings, MD;  Location: [facility];  Service: Orthopedics;  Laterality: Right;    There were no vitals filed for this visit.  Visit Diagnosis:  Pain in right knee  Abnormality of gait  Decreased ROM of right knee  Activity intolerance  Localized edema      Subjective Assessment - 08/16/15 1311    Currently in Pain? Yes   Pain Score --  moderate   Pain Orientation Right;Anterior;Proximal   Pain Descriptors / Indicators Aching            [facility] PT Assessment - 08/16/15 0948    Observation/Other Assessments   Focus on Therapeutic Outcomes (FOTO)  43% limited , 41% limited on eval    AROM   Right Knee Extension -16  long sitting   Right Knee Flexion 108                      [facility] Adult PT Treatment/Exercise - 08/16/15 0955    Knee/Hip Exercises: Stretches   Active Hamstring Stretch 3 reps;30 seconds   Active Hamstring Stretch Limitations various positions   Knee/Hip Exercises: Aerobic   Elliptical Ramp 1 Level 3  x 5 min LE only   Recumbent Bike L3 x 8 min   Knee/Hip Exercises: Standing   Rebounder 8 toss best with right SLS   Other Standing Knee Exercises TKE with ball on wall   Knee/Hip Exercises: Supine   Terminal Knee Extension Right;20 reps  with heel prop   Terminal Knee Extension Limitations and over pressure   Knee/Hip Exercises: Prone   Prone Knee Hang 2 minutes;Weights   Prone Knee Hang Weights (lbs) 5#   Modalities   Modalities Vasopneumatic   Cryotherapy   Number Minutes Cryotherapy --   Cryotherapy Location --   Type of Cryotherapy --   Vasopneumatic   Number Minutes Vasopneumatic  15 minutes   Vasopnuematic Location  Knee   Vasopneumatic Pressure Medium   Vasopneumatic Temperature  32  PT Short Term Goals - 08/02/15 0947    PT SHORT TERM GOAL #1   Title "Independent with initial HEP   Time 4   Period Weeks   Status Achieved   PT SHORT TERM GOAL #2   Title "Report pain decrease at rest from  7 /10 to  4 /10.   Baseline 4   Time 4   Period Weeks   Status Achieved   PT SHORT TERM GOAL #3   Title "Demonstrate and verbalize understanding of condition management including RICE, positioning, use of A.D., HEP.    Time 4   Period Weeks   Status Achieved   PT SHORT TERM GOAL #4   Title Pt to demonstrate proper posture for walking and carryover to each visit ( decrease flexed posture for gait)   Time 4   Period Weeks   Status Achieved           PT Long Term Goals - 08/16/15 0957    PT LONG TERM GOAL #1   Title "Pt will be independent with advanced HEP.    Time 8   Period Weeks   Status On-going   PT LONG TERM GOAL #2   Title Pt will be educated on gym  equipment and precautians for Home use and safety of knee.   Time 8   Period Weeks   Status Achieved   PT LONG TERM GOAL #3   Title "Report pain decrease at rest from 4  /10 to 1  /10.   Time 8   Period Weeks   Status Achieved   PT LONG TERM GOAL #4   Title "FOTO will improve from  41%limitation  to 32% limitation    indicating improved functional mobility .    Time 8   Period Weeks   Status Not Met   PT LONG TERM GOAL #5   Title "R knee AAROM ext/flexion will improve to + 9ext to 115 flex degrees for improved mobility for walking and steps   Time 8   Period Weeks   Status On-going   PT LONG TERM GOAL #6   Title Pt will be able to sleep at night with 4 hours of uninterrupted sleep and 1/10 pain level or less   Time 8   Period Weeks   Status Achieved               Plan - 08/16/15 1314    Clinical Impression Statement Pt demonstrates right knee flexion AROM to 108. He continues to demonstrates -16 knee extension AROM. He reports his pain at night while trying to sleep has improved and he is no longer waking due to pain. He plans to join [employer] after this week and discharge from PT. Will continue gym equipment and reinforce HEP to maximize ROM gains.    PT Next Visit Plan Review HEP and discharge- emphasize knee extension ROM exercises. FOTO done last visit        Problem List Patient Active Problem List   Diagnosis Date Noted  . Swelling of right lower extremity 05/15/2015  . Pain due to total right knee replacement ([place]) 05/15/2015  . History of DVT (deep vein thrombosis) 05/09/2015  . Acute blood loss anemia 05/06/2015  . 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 Left ventricular hypertrophy by electrocardiogram 04/07/2015  . Essential hypertension, benign 04/01/2014  . Osteoarthritis 04/01/2014  . Knee pain, right 04/01/2014    Dorene Ar, PTA 08/16/2015, 1:27 PM  [place], [place], [address] Phone: [phone]   Fax:  [phone]  Name: SONNIE PAWLOSKI MRN: [id number] Date of Birth: 19-Aug-1956

## 2015-08-19 ENCOUNTER — Ambulatory Visit: Payer: Medicare Other | Admitting: Physical Therapy

## 2015-08-19 DIAGNOSIS — R29898 Other symptoms and signs involving the musculoskeletal system: Secondary | ICD-10-CM | POA: Diagnosis not present

## 2015-08-19 DIAGNOSIS — R601 Generalized edema: Secondary | ICD-10-CM

## 2015-08-19 DIAGNOSIS — R269 Unspecified abnormalities of gait and mobility: Secondary | ICD-10-CM

## 2015-08-19 DIAGNOSIS — R600 Localized edema: Secondary | ICD-10-CM

## 2015-08-19 DIAGNOSIS — R6889 Other general symptoms and signs: Secondary | ICD-10-CM

## 2015-08-19 DIAGNOSIS — M25661 Stiffness of right knee, not elsewhere classified: Secondary | ICD-10-CM

## 2015-08-19 DIAGNOSIS — M25561 Pain in right knee: Secondary | ICD-10-CM

## 2015-08-19 NOTE — Therapy (Signed)
[place], [place], [address] Phone: [phone]   Fax:  [phone]  Physical Therapy Treatment/Discharge  Patient Details  Name: Dustin [name] MRN: [id number] Date of Birth: 1955-12-06 Referring Provider: Rodell Perna MD  Encounter Date: 08/19/2015      PT End of Session - 08/19/15 1011    Visit Number 10   Number of Visits 16   Date for PT Re-Evaluation 09/06/15   Authorization Type Medicare/Medicaid   Authorization Time Period 09/06/15   PT Start Time 0929   PT Stop Time 1010   PT Time Calculation (min) 41 min   Activity Tolerance Patient tolerated treatment well   Behavior During Therapy [facility] for tasks assessed/performed      Past Medical History  Diagnosis Date  . History of blood clots     to left arm  . Arthritis     bil knees, left hand  . DVT of upper extremity (deep vein thrombosis)     left brachial vein, 12/2010  . Hypertension   . Shortness of breath dyspnea     with exertion    Past Surgical History  Procedure Laterality Date  . Left  hand   2011    cyst removed.  . Total knee arthroplasty  10/19/2011    Procedure: TOTAL KNEE ARTHROPLASTY;  Surgeon: Sharmon Revere;  Location: [facility] [facility];  Service: Orthopedics;  Laterality: Left;  . Colonscopy       1 year ago   . Knee arthroplasty Right 04/30/2015    Procedure: COMPUTER ASSISTED TOTAL KNEE ARTHROPLASTY;  Surgeon: Marybelle Killings, MD;  Location: [place];  Service: Orthopedics;  Laterality: Right;    There were no vitals filed for this visit.  Visit Diagnosis:  Pain in right knee  Abnormality of gait  Decreased ROM of right knee  Activity intolerance  Localized edema  Generalized edema      Subjective Assessment - 08/19/15 0930    Subjective My pain is so much better than it was   Pertinent History R TKR with Rodell Perna  04-30-15   Limitations Standing;Walking;House hold activities   Currently in Pain? Other (Comment)   Pain Score --  occassionally    Pain Location Knee            [facility] PT Assessment - 08/19/15 0929    Assessment   Referring Provider Rodell Perna MD   Observation/Other Assessments   Focus on Therapeutic Outcomes (FOTO)  43% limited , 41% limited on eval FOTO taken 08-16-15   AROM   Right Knee Extension -16  long sitting   Right Knee Flexion 108                     [facility] Adult PT Treatment/Exercise - 08/19/15 0933    Knee/Hip Exercises: Stretches   Active Hamstring Stretch 3 reps;30 seconds   Active Hamstring Stretch Limitations various positions   Hip Flexor Stretch Right;3 reps;60 seconds   Hip Flexor Stretch Limitations Pt initially unable to   Knee: Self-Stretch to increase Flexion 3 reps;30 seconds   Knee: Self-Stretch Limitations prone with strap    Other Knee/Hip Stretches 3 reps; 30 seconds   Other Knee/Hip Stretches heel slide with strap assist   Knee/Hip Exercises: Standing   Lateral Step Up 15 reps;Hand Hold: 1;Step Height: 4"   Step Down 15 reps;Hand Hold: 1;Step Height: 4"   SLS with Vectors 10 x Right SLS with 10 x flex,  ext and abd   Other Standing Knee Exercises TKE with ball on wall   Other Standing Knee Exercises hip flex, add, abd against wall for maximizing ankle R ROM x 10   Knee/Hip Exercises: Supine   Terminal Knee Extension Right;15 reps  with heel prop   Terminal Knee Extension Limitations and over pressure   Knee/Hip Exercises: Prone   Hamstring Curl 2 sets;10 reps   Prone Knee Hang 2 minutes;Weights   Prone Knee Hang Weights (lbs) 7#   Other Prone Exercises prone knee flexion stretch 3x 30 with strap   Manual Therapy   Joint Mobilization AP joint mob to increase extension 3 bouts of 30 sec                PT Education - 08/19/15 0954    Education provided Yes   Education Details Pt reviewed HEP and answered questions  about gym exercise reviewed goals   Person(s) Educated Patient   Methods Explanation;Demonstration    Comprehension Verbalized understanding;Returned demonstration          PT Short Term Goals - 08/02/15 0947    PT SHORT TERM GOAL #1   Title "Independent with initial HEP   Time 4   Period Weeks   Status Achieved   PT SHORT TERM GOAL #2   Title "Report pain decrease at rest from  7 /10 to  4 /10.   Baseline 4   Time 4   Period Weeks   Status Achieved   PT SHORT TERM GOAL #3   Title "Demonstrate and verbalize understanding of condition management including RICE, positioning, use of A.D., HEP.    Time 4   Period Weeks   Status Achieved   PT SHORT TERM GOAL #4   Title Pt to demonstrate proper posture for walking and carryover to each visit ( decrease flexed posture for gait)   Time 4   Period Weeks   Status Achieved           PT Long Term Goals - 08/19/15 5638    PT LONG TERM GOAL #1   Title "Pt will be independent with advanced HEP.    Time 8   Period Weeks   Status Achieved   PT LONG TERM GOAL #2   Title Pt will be educated on gym equipment and precautians for Home use and safety of knee.   Time 8   Period Weeks   Status Achieved   PT LONG TERM GOAL #3   Title "Report pain decrease at rest from 4  /10 to 1  /10.   Baseline No pain today but occassionally will feel pain sharp    Time 8   Period Weeks   Status Achieved   PT LONG TERM GOAL #4   Title "FOTO will improve from  41%limitation  to 32% limitation    indicating improved functional mobility .    Baseline Pt increased to 43% from 41% on evaluation   Time 8   Period Weeks   Status Not Met   PT LONG TERM GOAL #5   Title "R knee AAROM ext/flexion will improve to + 9ext to 115 flex degrees for improved mobility for walking and steps   Time 8   Period Weeks   PT LONG TERM GOAL #6   Title Pt will be able to sleep at night with 4 hours of uninterrupted sleep and 1/10 pain level or less   Baseline Pt able to slelep all night now  Time 8   Period Weeks   Status Achieved               Plan -  08/19/15 1012    Clinical Impression Statement Pt reviewed HEP for home use and discussed importance of continuing strengthening.  Pt R ext -16 to 108 flex. AROM. Pt has achieved all goals and will continue stretngthening at [employer].  Pt was educated on importance of knee extension and strengthening.  Pt maximized rehab and has not  increased extension greater Actively than -16.  Will discharged due to achieved goals and pt pleased with cureent progress.  Pt FOTO is 43% limitation and eval was 41% limitaton although pt function is improved.   PT Next Visit Plan D/C        Problem List Patient Active Problem List   Diagnosis Date Noted  . Swelling of right lower extremity 05/15/2015  . Pain due to total right knee replacement ([place]) 05/15/2015  . History of DVT (deep vein thrombosis) 05/09/2015  . Acute blood loss anemia 05/06/2015  . 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 Left ventricular hypertrophy by electrocardiogram 04/07/2015  . Essential hypertension, benign 04/01/2014  . Osteoarthritis 04/01/2014  . Knee pain, right 04/01/2014    Voncille Lo, PT 08/19/2015 1:29 PM Phone: [phone] Fax: [facility] [address] [place], [place], [address] Phone: [phone]   Fax:  [phone]  Name: Dustin [name] MRN: [id number] Date of Birth: 1956/01/19   PHYSICAL THERAPY DISCHARGE SUMMARY  Visits from Start of Care: 10 Current functional level related to goals / functional outcomes: As above   Remaining deficits: AROM ext -16 to 108 flexion   Education / Equipment: HEP Plan: Patient agrees to discharge.  Patient goals were partially met. Patient is being discharged due to meeting the stated rehab goals.  ????? and being pleased with current functional level         Voncille Lo, PT 08/19/2015 1:29 PM Phone: [phone] Fax: [phone]

## 2015-08-31 ENCOUNTER — Ambulatory Visit: Payer: Medicare Other | Attending: Family Medicine | Admitting: Family Medicine

## 2015-08-31 ENCOUNTER — Encounter: Payer: Self-pay | Admitting: Family Medicine

## 2015-08-31 VITALS — BP 150/79 | HR 69 | Temp 98.2°F | Resp 16 | Ht 72.0 in | Wt 217.0 lb

## 2015-08-31 DIAGNOSIS — I10 Essential (primary) hypertension: Secondary | ICD-10-CM | POA: Diagnosis not present

## 2015-08-31 DIAGNOSIS — Z23 Encounter for immunization: Secondary | ICD-10-CM | POA: Insufficient documentation

## 2015-08-31 DIAGNOSIS — Z0000 Encounter for general adult medical examination without abnormal findings: Secondary | ICD-10-CM | POA: Diagnosis not present

## 2015-08-31 MED ORDER — AMLODIPINE BESYLATE 10 MG PO TABS: 10.0000 mg | ORAL_TABLET | Freq: Every day | ORAL | Status: DC

## 2015-08-31 NOTE — Progress Notes (Signed)
Patient ID: Dustin [name], male   DOB: Apr 30, 1956, 59 y.o.   MRN: [id number]   Subjective:  Patient ID: Dustin [name], male    DOB: 10/18/1956  Age: 59 y.o. MRN: [id number]  CC: Hypertension   HPI IRIS TATSCH presents for    1. CHRONIC HYPERTENSION  Disease Monitoring  Blood pressure range: not checking but better during hospitalization while on norvasc 10 mg daily   Chest pain: no   Dyspnea: no   Claudication: no   Medication compliance: yes  Medication Side Effects  Lightheadedness: no   Urinary frequency: no   Edema: no    Preventitive Healthcare:  Exercise: no   Diet Pattern: regular meals   Salt Restriction: no   2. HM:  Amenable to flu shot .  Social History  Substance Use Topics  . Smoking status: Never Smoker   . Smokeless tobacco: Not on file  . Alcohol Use: No    Outpatient Prescriptions Prior to Visit  Medication Sig Dispense Refill  . amLODipine (NORVASC) 5 MG tablet Take 1 tablet (5 mg total) by mouth daily. 90 tablet 1  . aspirin EC 325 MG tablet Take 1 tablet (325 mg total) by mouth daily. 30 tablet 0  . ibuprofen (ADVIL,MOTRIN) 600 MG tablet Take 1 tablet (600 mg total) by mouth every 8 (eight) hours as needed. 30 tablet 1  . methocarbamol (ROBAXIN) 500 MG tablet Take 1 tablet (500 mg total) by mouth 4 (four) times daily. 60 tablet 0  . oxyCODONE-acetaminophen (PERCOCET) 7.5-325 MG per tablet Take 1 tablet by mouth every 4 (four) hours as needed for severe pain. (Patient not taking: Reported on 08/31/2015) 60 tablet 0  . traMADol (ULTRAM) 50 MG tablet Take 1 tablet (50 mg total) by mouth every 8 (eight) hours as needed for moderate pain. (Patient not taking: Reported on 07/12/2015) 30 tablet 0   No facility-administered medications prior to visit.    ROS Review of Systems  Constitutional: Negative for fever, chills, fatigue and unexpected weight change.  Eyes: Negative for visual disturbance.  Respiratory: Negative for cough and shortness of  breath.   Cardiovascular: Negative for chest pain, palpitations and leg swelling.  Gastrointestinal: Negative for nausea, vomiting, abdominal pain, diarrhea, constipation and blood in stool.  Endocrine: Negative for polydipsia, polyphagia and polyuria.  Musculoskeletal: Negative for myalgias, back pain, arthralgias, gait problem and neck pain.  Skin: Negative for rash.  Allergic/Immunologic: Negative for immunocompromised state.  Hematological: Negative for adenopathy. Does not bruise/bleed easily.  Psychiatric/Behavioral: Negative for suicidal ideas, sleep disturbance and dysphoric mood. The patient is not nervous/anxious.     Objective:  BP 150/79 mmHg  Pulse 69  Temp(Src) 98.2 F (36.8 C) (Oral)  Resp 16  Ht 6' (1.829 m)  Wt 217 lb (98.431 kg)  BMI 29.42 kg/m2  SpO2 97%  BP/Weight 08/31/2015 05/27/2015 4/35/6861  Systolic BP 683 729 021  Diastolic BP 79 70 81  Wt. (Lbs) 217 199.2 -  BMI 29.42 27.01 -   Physical Exam  Constitutional: He appears well-developed and well-nourished. No distress.  HENT:  Head: Normocephalic and atraumatic.  Neck: Normal range of motion. Neck supple.  Cardiovascular: Normal rate, regular rhythm, normal heart sounds and intact distal pulses.   Pulmonary/Chest: Effort normal and breath sounds normal.  Musculoskeletal: He exhibits no edema.  Neurological: He is alert.  Skin: Skin is warm and dry. No rash noted. No erythema.  Psychiatric: He has a normal mood and affect.  Assessment & Plan:   Problem List Items Addressed This Visit    Essential hypertension, benign - Primary   Relevant Medications   amLODipine (NORVASC) 10 MG tablet    Other Visit Diagnoses    Healthcare maintenance        Relevant Orders    Flu Vaccine QUAD 36+ mos IM       No orders of the defined types were placed in this encounter.    Follow-up: No Follow-up on file.   Boykin Nearing MD

## 2015-08-31 NOTE — Progress Notes (Signed)
F/U HTN Stated was taking Amlodipine 10 mg at hospital  At home taking Amlodipine 05 mg  No Hx tobacco

## 2015-08-31 NOTE — Patient Instructions (Signed)
Stephon was seen today for hypertension.  Diagnoses and all orders for this visit:  Essential hypertension, benign -     amLODipine (NORVASC) 10 MG tablet; Take 1 tablet (10 mg total) by mouth daily.   Flu shot given today  F/u with me in 4 weeks with me for BP check  Dr. Adrian Blackwater

## 2015-10-06 ENCOUNTER — Ambulatory Visit: Payer: Medicare Other | Attending: Family Medicine | Admitting: Pharmacist

## 2015-10-06 VITALS — BP 128/80 | HR 80

## 2015-10-06 DIAGNOSIS — I10 Essential (primary) hypertension: Secondary | ICD-10-CM | POA: Diagnosis not present

## 2015-10-06 DIAGNOSIS — Z79899 Other long term (current) drug therapy: Secondary | ICD-10-CM | POA: Diagnosis not present

## 2015-10-06 NOTE — Patient Instructions (Signed)
Thanks for coming to see me!  Your blood pressure looks great! Keep taking the amlodipine 10 mg daily.  Follow up with Dr. Adrian Blackwater as directed

## 2015-10-06 NOTE — Progress Notes (Signed)
S:    Patient arrives in good spirits.    Presents to the clinic for hypertension evaluation.   Patient reports adherence with medications. He reports that he took his amlodipine this morning at around 7 AM.   Current BP Medications include:  Amlodipine 10 mg daily.   Patient denies any adverse effects or issues taking the medication.  O:   Last 3 Office BP readings: BP Readings from Last 3 Encounters:  10/06/15 128/80  08/31/15 150/79  05/27/15 130/70    BMET    Component Value Date/Time   NA 132* 05/01/2015 0428   K 4.0 05/01/2015 0428   CL 98* 05/01/2015 0428   CO2 27 05/01/2015 0428   GLUCOSE 138* 05/01/2015 0428   BUN 14 05/01/2015 0428   CREATININE 1.05 05/01/2015 0428   CREATININE 0.96 11/17/2014 1218   CALCIUM 9.2 05/01/2015 0428   GFRNONAA >60 05/01/2015 0428   GFRNONAA 87 11/17/2014 1218   GFRAA >60 05/01/2015 0428   GFRAA >89 11/17/2014 1218    A/P: History of hypertension currently controlled on current medications.  Continue amlodipine 10 mg daily.   Medication reconciliation completed. Results reviewed and written information provided.   Total time in face-to-face counseling 10 minutes.  F/U Clinic Visit with Dr. Adrian Blackwater as directed.

## 2015-10-31 DIAGNOSIS — D126 Benign neoplasm of colon, unspecified: Secondary | ICD-10-CM

## 2015-10-31 HISTORY — DX: Benign neoplasm of colon, unspecified: D12.6

## 2015-11-02 DIAGNOSIS — M25561 Pain in right knee: Secondary | ICD-10-CM | POA: Diagnosis not present

## 2015-11-02 DIAGNOSIS — M1712 Unilateral primary osteoarthritis, left knee: Secondary | ICD-10-CM | POA: Diagnosis not present

## 2015-11-02 DIAGNOSIS — M1711 Unilateral primary osteoarthritis, right knee: Secondary | ICD-10-CM | POA: Diagnosis not present

## 2015-11-04 ENCOUNTER — Telehealth: Payer: Self-pay | Admitting: Family Medicine

## 2015-11-04 NOTE — Telephone Encounter (Signed)
Pt. Wanted to schedule an appointment to discuss getting a shot for possible hepatitis. I made him an appointment Jan 19th but I wasn't sure if this could be handled on the phone.  We can cancel the appointment if not needed.

## 2015-11-18 ENCOUNTER — Encounter: Payer: Self-pay | Admitting: Family Medicine

## 2015-11-18 ENCOUNTER — Ambulatory Visit: Payer: Medicare Other | Attending: Family Medicine | Admitting: Family Medicine

## 2015-11-18 VITALS — BP 132/81 | HR 70 | Temp 97.6°F | Resp 16 | Ht 70.5 in | Wt 210.0 lb

## 2015-11-18 DIAGNOSIS — Z96651 Presence of right artificial knee joint: Secondary | ICD-10-CM | POA: Diagnosis not present

## 2015-11-18 DIAGNOSIS — Z13818 Encounter for screening for other digestive system disorders: Secondary | ICD-10-CM | POA: Insufficient documentation

## 2015-11-18 DIAGNOSIS — R945 Abnormal results of liver function studies: Secondary | ICD-10-CM | POA: Diagnosis not present

## 2015-11-18 DIAGNOSIS — Z7982 Long term (current) use of aspirin: Secondary | ICD-10-CM | POA: Diagnosis not present

## 2015-11-18 DIAGNOSIS — I10 Essential (primary) hypertension: Secondary | ICD-10-CM | POA: Diagnosis not present

## 2015-11-18 DIAGNOSIS — Z23 Encounter for immunization: Secondary | ICD-10-CM

## 2015-11-18 DIAGNOSIS — D62 Acute posthemorrhagic anemia: Secondary | ICD-10-CM | POA: Diagnosis not present

## 2015-11-18 DIAGNOSIS — Z114 Encounter for screening for human immunodeficiency virus [HIV]: Secondary | ICD-10-CM

## 2015-11-18 DIAGNOSIS — Z0000 Encounter for general adult medical examination without abnormal findings: Secondary | ICD-10-CM | POA: Diagnosis not present

## 2015-11-18 DIAGNOSIS — T8484XD Pain due to internal orthopedic prosthetic devices, implants and grafts, subsequent encounter: Secondary | ICD-10-CM | POA: Diagnosis not present

## 2015-11-18 DIAGNOSIS — Z1159 Encounter for screening for other viral diseases: Secondary | ICD-10-CM | POA: Diagnosis not present

## 2015-11-18 DIAGNOSIS — B182 Chronic viral hepatitis C: Secondary | ICD-10-CM

## 2015-11-18 LAB — CBC
HEMATOCRIT: 41.8 % (ref 39.0–52.0)
HEMOGLOBIN: 14.3 g/dL (ref 13.0–17.0)
MCH: 29.3 pg (ref 26.0–34.0)
MCHC: 34.2 g/dL (ref 30.0–36.0)
MCV: 85.7 fL (ref 78.0–100.0)
MPV: 10.2 fL (ref 8.6–12.4)
Platelets: 245 10*3/uL (ref 150–400)
RBC: 4.88 MIL/uL (ref 4.22–5.81)
RDW: 14.4 % (ref 11.5–15.5)
WBC: 3.9 10*3/uL — ABNORMAL LOW (ref 4.0–10.5)

## 2015-11-18 LAB — POCT GLYCOSYLATED HEMOGLOBIN (HGB A1C): Hemoglobin A1C: 5.4

## 2015-11-18 MED ORDER — ACETAMINOPHEN-CODEINE #3 300-30 MG PO TABS: 1.0000 | ORAL_TABLET | Freq: Three times a day (TID) | ORAL | Status: DC | PRN

## 2015-11-18 NOTE — Progress Notes (Signed)
F/U HTN  Requesting Hep C, HIV testing  No pain today  Taking medication daily as prescribed  No tobacco user  No suicidal thought in the past two weeks

## 2015-11-18 NOTE — Assessment & Plan Note (Signed)
Tylenol #3 added to pain control regimen

## 2015-11-18 NOTE — Assessment & Plan Note (Signed)
A: Blood pressure at goal. Meds: compliant  P: I will continue the patient's current medication regimen since her blood pressure is at goal.   

## 2015-11-18 NOTE — Progress Notes (Signed)
Subjective:  Patient ID: Dustin [name], male    DOB: August 26, 1956  Age: 60 y.o. MRN: [id number]  CC: Hypertension   HPI Dustin [name] presents for   1. CHRONIC HYPERTENSION  Disease Monitoring  Blood pressure range: not checking   Chest pain: no   Dyspnea: no   Claudication: no   Medication compliance: yes  Medication Side Effects  Lightheadedness: no   Urinary frequency: no   Edema: no   2. Hep C: patient would like to be screened for hep C. He reports hx of frequent sex partners in younger age. He denies IVDU and current high risk sex. He is amenable to HIV screen.  3. Knee pain: he is s/p bl knee replacement. He has chronic mild to moderate knee pain. He has been released from ortho and PT. He is going back to the gym. He takes tylenol prn pain with mild improvement.     Social History  Substance Use Topics  . Smoking status: Never Smoker   . Smokeless tobacco: Not on file  . Alcohol Use: No    Outpatient Prescriptions Prior to Visit  Medication Sig Dispense Refill  . amLODipine (NORVASC) 10 MG tablet Take 1 tablet (10 mg total) by mouth daily. 90 tablet 3  . aspirin EC 325 MG tablet Take 1 tablet (325 mg total) by mouth daily. 30 tablet 0   No facility-administered medications prior to visit.    ROS Review of Systems  Constitutional: Negative for fever, chills, fatigue and unexpected weight change.  Eyes: Negative for visual disturbance.  Respiratory: Negative for cough and shortness of breath.   Cardiovascular: Negative for chest pain, palpitations and leg swelling.  Gastrointestinal: Negative for nausea, vomiting, abdominal pain, diarrhea, constipation and blood in stool.  Endocrine: Negative for polydipsia, polyphagia and polyuria.  Musculoskeletal: Positive for arthralgias. Negative for myalgias, back pain, gait problem and neck pain.  Skin: Negative for rash.  Allergic/Immunologic: Negative for immunocompromised state.  Hematological: Negative for  adenopathy. Does not bruise/bleed easily.  Psychiatric/Behavioral: Negative for suicidal ideas, sleep disturbance and dysphoric mood. The patient is not nervous/anxious.     Objective:  BP 132/81 mmHg  Pulse 70  Temp(Src) 97.6 F (36.4 C) (Oral)  Resp 16  Ht 5' 10.5" (1.791 m)  Wt 210 lb (95.255 kg)  BMI 29.70 kg/m2  SpO2 99%  BP/Weight 11/18/2015 10/06/2015 [id number]  Systolic BP Q000111Q 0000000 Q000111Q  Diastolic BP 81 80 79  Wt. (Lbs) 210 - 217  BMI 29.7 - 29.42   Physical Exam  Constitutional: He appears well-developed and well-nourished. No distress.  HENT:  Head: Normocephalic and atraumatic.  Neck: Normal range of motion. Neck supple.  Cardiovascular: Normal rate, regular rhythm, normal heart sounds and intact distal pulses.   Pulmonary/Chest: Effort normal and breath sounds normal.  Musculoskeletal: He exhibits no edema.  Neurological: He is alert.  Skin: Skin is warm and dry. No rash noted. No erythema.  Psychiatric: He has a normal mood and affect.   Lab Results  Component Value Date   HGBA1C 5.40 11/18/2015   Assessment & Plan:   Problem List Items Addressed This Visit    Acute blood loss anemia (Chronic)    Repeat CBC today       Relevant Orders   CBC   Essential hypertension, benign (Chronic)    A: Blood pressure at goal. Meds: compliant  P: I will continue the patient's current medication regimen since her blood pressure is at goal.  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    Follow-up: No Follow-up on file.   Boykin Nearing MD

## 2015-11-18 NOTE — Patient Instructions (Addendum)
Bertice was seen today for hypertension.  Diagnoses and all orders for this visit:  Healthcare maintenance -     POCT glycosylated hemoglobin (Hb A1C) -     Ambulatory referral to Gastroenterology  Need for hepatitis C screening test -     Hepatitis C antibody, reflex  Encounter for screening for HIV -     HIV antibody (with reflex)  Pain due to total right knee replacement, subsequent encounter -     acetaminophen-codeine (TYLENOL #3) 300-30 MG tablet; Take 1-2 tablets by mouth every 8 (eight) hours as needed for moderate pain.  Acute blood loss anemia -     CBC  Other orders -     Tdap vaccine greater than or equal to 7yo IM   You will lab results  You will be called with GI appt   F/u in 3 months for HTN   Dr. Adrian Blackwater

## 2015-11-18 NOTE — Assessment & Plan Note (Signed)
Repeat CBC today 

## 2015-11-19 LAB — HEPATITIS C ANTIBODY: HCV Ab: REACTIVE — AB

## 2015-11-19 LAB — HIV ANTIBODY (ROUTINE TESTING W REFLEX): HIV 1&2 Ab, 4th Generation: NONREACTIVE

## 2015-11-22 DIAGNOSIS — B182 Chronic viral hepatitis C: Secondary | ICD-10-CM | POA: Insufficient documentation

## 2015-11-22 LAB — HEPATITIS C RNA QUANTITATIVE
HCV Quantitative Log: 6.62 {Log} — ABNORMAL HIGH (ref <1.18)
HCV Quantitative: 4129052 IU/mL — ABNORMAL HIGH (ref <15)

## 2015-11-22 NOTE — Addendum Note (Signed)
Addended by: Boykin Nearing on: 11/22/2015 07:48 PM   Modules accepted: Orders, SmartSet

## 2015-12-02 ENCOUNTER — Other Ambulatory Visit: Payer: Medicare Other

## 2015-12-02 DIAGNOSIS — B1710 Acute hepatitis C without hepatic coma: Secondary | ICD-10-CM

## 2015-12-02 LAB — IRON: Iron: 87 ug/dL (ref 50–180)

## 2015-12-02 LAB — COMPREHENSIVE METABOLIC PANEL
ALK PHOS: 76 U/L (ref 40–115)
ALT: 283 U/L — AB (ref 9–46)
AST: 163 U/L — AB (ref 10–35)
Albumin: 4.7 g/dL (ref 3.6–5.1)
BILIRUBIN TOTAL: 1.1 mg/dL (ref 0.2–1.2)
BUN: 14 mg/dL (ref 7–25)
CO2: 25 mmol/L (ref 20–31)
CREATININE: 0.88 mg/dL (ref 0.70–1.33)
Calcium: 9.5 mg/dL (ref 8.6–10.3)
Chloride: 101 mmol/L (ref 98–110)
Glucose, Bld: 99 mg/dL (ref 65–99)
Potassium: 3.7 mmol/L (ref 3.5–5.3)
Sodium: 138 mmol/L (ref 135–146)
Total Protein: 8 g/dL (ref 6.1–8.1)

## 2015-12-03 ENCOUNTER — Telehealth: Payer: Self-pay | Admitting: *Deleted

## 2015-12-03 LAB — HEPATITIS B CORE ANTIBODY, TOTAL: Hep B Core Total Ab: NONREACTIVE

## 2015-12-03 LAB — PROTIME-INR
INR: 1.06 (ref <1.50)
PROTHROMBIN TIME: 13.9 s (ref 11.6–15.2)

## 2015-12-03 LAB — HEPATITIS B SURFACE ANTIGEN: HEP B S AG: NEGATIVE

## 2015-12-03 LAB — HEPATITIS A ANTIBODY, TOTAL: Hep A Total Ab: NONREACTIVE

## 2015-12-03 LAB — HEPATITIS B SURFACE ANTIBODY,QUALITATIVE

## 2015-12-03 LAB — ANA: Anti Nuclear Antibody(ANA): NEGATIVE

## 2015-12-03 NOTE — Telephone Encounter (Signed)
Pt. Returned call. Please f/u °

## 2015-12-03 NOTE — Telephone Encounter (Signed)
-----   Message from Boykin Nearing, MD sent at 11/22/2015  7:47 PM EST ----- Hep C positive with very high viral load Screening HIV negative CBC normal ID referral placed for hep C treatment

## 2015-12-03 NOTE — Telephone Encounter (Signed)
LVM to return call.

## 2015-12-06 LAB — HCV RNA,LIPA RFLX NS5A DRUG RESIST

## 2015-12-08 ENCOUNTER — Telehealth: Payer: Self-pay

## 2015-12-08 NOTE — Telephone Encounter (Signed)
Message from Boykin Nearing, MD sent at 11/22/2015 7:47 PM EST ----- Hep C positive with very high viral load Screening HIV negative CBC normal ID referral placed for hep C treatment   CMA called patient, patient verified name and DOB. Patient was given lab results and verbalized he understood. Patient informed me that he has an upcoming appt with Dr. Linus Salmons on 01/12/16. Patient had no further questions.

## 2015-12-09 LAB — HCV RNA NS5A DRUG RESISTANCE

## 2016-01-12 ENCOUNTER — Ambulatory Visit (INDEPENDENT_AMBULATORY_CARE_PROVIDER_SITE_OTHER): Payer: Medicare Other | Admitting: Internal Medicine

## 2016-01-12 ENCOUNTER — Encounter: Payer: Self-pay | Admitting: Internal Medicine

## 2016-01-12 VITALS — BP 162/79 | HR 90 | Temp 97.7°F | Ht 70.5 in | Wt 225.0 lb

## 2016-01-12 DIAGNOSIS — Z23 Encounter for immunization: Secondary | ICD-10-CM | POA: Diagnosis not present

## 2016-01-12 DIAGNOSIS — B182 Chronic viral hepatitis C: Secondary | ICD-10-CM | POA: Diagnosis not present

## 2016-01-12 MED ORDER — LEDIPASVIR-SOFOSBUVIR 90-400 MG PO TABS: 1.0000 | ORAL_TABLET | Freq: Every day | ORAL | Status: DC

## 2016-01-12 NOTE — Patient Instructions (Signed)
Date 01/12/2016  Dear Mr Bursey, As discussed in the [facility], your hepatitis C therapy will include the following medications:          Harvoni 90mg /400mg  tablet:           Take 1 tablet by mouth once daily   Please note that ALL MEDICATIONS WILL START ON THE SAME DATE for a total of 12 weeks. ---------------------------------------------------------------- Your HCV Treatment Start Date: TBA   Your HCV genotype:  1a    Liver Fibrosis: TBD    ---------------------------------------------------------------- YOUR PHARMACY CONTACT:   [place] [facility] and [place] Phone: [phone] Hours: Monday to Friday 7:30 am to 6:00 pm   Please always contact your pharmacy at least 3-4 business days before you run out of medications to ensure your next month's medication is ready or 1 week prior to running out if you receive it by mail.  Remember, each prescription is for 28 days. ---------------------------------------------------------------- GENERAL NOTES REGARDING YOUR HEPATITIS C MEDICATION:  SOFOSBUVIR/LEDIPASVIR (HARVONI): - Harvoni tablet is taken daily with OR without food. - The tablets are orange. - The tablets should be stored at room temperature.  - Acid reducing agents such as H2 blockers (ie. Pepcid (famotidine), Zantac (ranitidine), Tagamet (cimetidine), Axid (nizatidine) and proton pump inhibitors (ie. Prilosec (omeprazole), Protonix (pantoprazole), Nexium (esomeprazole), or Aciphex (rabeprazole)) can decrease effectiveness of Harvoni. Do not take until you have discussed with a health care provider.    -Antacids that contain magnesium and/or aluminum hydroxide (ie. Milk of Magensia, Rolaids, Gaviscon, Maalox, Mylanta, an dArthritis Pain Formula)can reduce absorption of Harvoni, so take them at least 4 hours before or after Harvoni.  -Calcium carbonate (calcium supplements or antacids such as Tums, Caltrate,  Os-Cal)needs to be taken at least 4 hours hours before or after Harvoni.  -St. John's wort or any products that contain St. John's wort like some herbal supplements  Please inform the office prior to starting any of these medications.  - The common side effects associated with Harvoni include:      1. Fatigue      2. Headache      3. Nausea      4. Diarrhea      5. Insomnia  Please note that this only lists the most common side effects and is NOT a comprehensive list of the potential side effects of these medications. For more information, please review the drug information sheets that come with your medication package from the pharmacy.  ---------------------------------------------------------------- GENERAL HELPFUL HINTS ON HCV THERAPY: 1. Stay well-hydrated. 2. Notify the [facility] Clinic of any changes in your other over-the-counter/herbal or prescription medications. 3. If you miss a dose of your medication, take the missed dose as soon as you remember. Return to your regular time/dose schedule the next day.  4.  Do not stop taking your medications without first talking with your healthcare provider. 5.  You may take Tylenol (acetaminophen), as long as the dose is less than 2000 mg (OR no more than 4 tablets of the Tylenol Extra Strengths 500mg  tablet) in 24 hours. 6.  You will see our pharmacist-specialist within the first 2 weeks of starting your medication. 7.  You will need to obtain routine labs around week 4 and12 weeks after starting and then 3 to 6 months after finishing Harvoni.    Scharlene Gloss, [place] [facility] [place],  [place]  [id number] [phone]

## 2016-01-12 NOTE — Progress Notes (Signed)
[facility]   CC: consideration for treatment for chronic hepatitis C  HPI:  +Dustin [name] is a 60 y.o. male who presents for initial evaluation and management of chronic hepatitis C.  Patient tested positive earlier this year during routine screening. Hepatitis C-associated risk factors present are: none. Patient denies history of blood transfusion, intranasal drug use, IV drug abuse, multiple sexual partners, renal dialysis, sexual contact with person with liver disease, tattoos. Patient has had other studies performed. Results: hepatitis C RNA by PCR, result: positive. Patient has not had prior treatment for Hepatitis C. Patient does not have a past history of liver disease. Patient does not have a family history of liver disease. Patient does not  have associated signs or symptoms related to liver disease.  Labs reviewed and confirm chronic hepatitis C with a positive viral load.   Records reviewed from PCP, has HTN.        Patient does not have documented immunity to Hepatitis A. Patient does not have documented immunity to Hepatitis B.    Review of Systems:   Constitutional: negative for fatigue and malaise Gastrointestinal: negative for diarrhea Hematologic/lymphatic: negative for lymphadenopathy All other systems reviewed and are negative      Past Medical History  Diagnosis Date  . History of blood clots     to left arm  . Arthritis     bil knees, left hand  . DVT of upper extremity (deep vein thrombosis) (HCC)     left brachial vein, 12/2010  . Hypertension   . Shortness of breath dyspnea     with exertion    Prior to Admission medications   Medication Sig Start Date End Date Taking? Authorizing Provider  acetaminophen-codeine (TYLENOL #3) 300-30 MG tablet Take 1-2 tablets by mouth every 8 (eight) hours as needed for moderate pain. 11/18/15  Yes Josalyn Funches, MD  amLODipine (NORVASC) 10 MG tablet Take 1 tablet (10 mg total) by mouth  daily. 08/31/15  Yes Boykin Nearing, MD  aspirin EC 325 MG tablet Take 1 tablet (325 mg total) by mouth daily. 04/30/15  Yes Lanae Crumbly, PA-C    No Known Allergies  Social History  Substance Use Topics  . Smoking status: Never Smoker   . Smokeless tobacco: Never Used  . Alcohol Use: No    Family History  Problem Relation Age of Onset  . Diabetes Other   . Hypertension Other   . Anesthesia problems Neg Hx   . Hypotension Neg Hx   . Malignant hyperthermia Neg Hx   . Pseudochol deficiency Neg Hx   . Cancer Mother   . Cancer Father     prostate cancer  . Diabetes Maternal Uncle       Objective:  Constitutional: in no apparent distress and alert,  Filed Vitals:   01/12/16 1344  BP: 162/79  Pulse: 90  Temp: 97.7 F (36.5 C)   Eyes: anicteric Cardiovascular: Cor RRR and No murmurs Respiratory: CTA B; normal respiratory effort Gastrointestinal: Bowel sounds are normal, liver is not enlarged, spleen is not enlarged Musculoskeletal: peripheral pulses normal, no pedal edema, no clubbing or cyanosis Skin: negatives: no rash; no porphyria cutanea tarda Lymphatic: no cervical lymphadenopathy   Laboratory Genotype: No results found for: HCVGENOTYPE HCV viral load:  Lab Results  Component Value Date   HCVQUANT [id number]* 11/18/2015   Lab Results  Component Value Date   WBC 3.9* 11/18/2015   HGB 14.3 11/18/2015   HCT  41.8 11/18/2015   MCV 85.7 11/18/2015   PLT 245 11/18/2015    Lab Results  Component Value Date   CREATININE 0.88 12/02/2015   BUN 14 12/02/2015   NA 138 12/02/2015   K 3.7 12/02/2015   CL 101 12/02/2015   CO2 25 12/02/2015    Lab Results  Component Value Date   ALT 283* 12/02/2015   AST 163* 12/02/2015   ALKPHOS 76 12/02/2015     Labs and history reviewed and show CHILD-PUGH A  5-6 points: Child class A 7-9 points: Child class B 10-15 points: Child class C  Lab Results  Component Value Date   INR 1.06 12/02/2015   BILITOT 1.1  12/02/2015   ALBUMIN 4.7 12/02/2015     Assessment: New Patient with Chronic Hepatitis C genotype 1a, untreated.  I discussed with the patient the lab findings that confirm chronic hepatitis C as well as the natural history and progression of disease including about 30% of people who develop cirrhosis of the liver if left untreated and once cirrhosis is established there is a 2-7% risk per year of liver cancer and liver failure.  I discussed the importance of treatment and benefits in reducing the risk, even if significant liver fibrosis exists.   Plan: 1) Patient counseled extensively on limiting acetaminophen to no more than 2 grams daily, avoidance of alcohol. 2) Transmission discussed with patient including sexual transmission, sharing razors and toothbrush.   3) Will need referral to gastroenterology if concern for cirrhosis 4) Will need referral for substance abuse counseling: No.; Further work up to include urine drug screen  No. 5) Will prescribe Harvoni for 12 weeks 6) Hepatitis A vaccine Yes.  once available 7) Hepatitis B vaccine Yes.   will start today 8) Pneumovax vaccine if concern for cirrhosis 9) Further work up to include liver staging with elastography 10) will follow up after starting medication

## 2016-02-03 ENCOUNTER — Ambulatory Visit (HOSPITAL_COMMUNITY)
Admission: RE | Admit: 2016-02-03 | Discharge: 2016-02-03 | Disposition: A | Discharge status: Home / Self Care | Payer: Medicare Other | Source: Ambulatory Visit | Attending: Internal Medicine | Admitting: Internal Medicine

## 2016-02-03 DIAGNOSIS — R932 Abnormal findings on diagnostic imaging of liver and biliary tract: Secondary | ICD-10-CM | POA: Insufficient documentation

## 2016-02-03 DIAGNOSIS — B182 Chronic viral hepatitis C: Secondary | ICD-10-CM | POA: Diagnosis not present

## 2016-02-03 DIAGNOSIS — B1920 Unspecified viral hepatitis C without hepatic coma: Secondary | ICD-10-CM | POA: Diagnosis not present

## 2016-02-08 ENCOUNTER — Other Ambulatory Visit: Payer: Self-pay | Admitting: Pharmacist Clinician (PhC)/ Clinical Pharmacy Specialist

## 2016-02-08 MED ORDER — ELBASVIR-GRAZOPREVIR 50-100 MG PO TABS: 1.0000 | ORAL_TABLET | Freq: Every day | ORAL | Status: DC

## 2016-02-09 MED FILL — *ZEPATIER 50-100 MG TABLET: 50-100 | 28 days supply | Qty: 28 | Fill #0

## 2016-02-10 ENCOUNTER — Encounter: Payer: Self-pay | Admitting: Pharmacy Technician

## 2016-03-01 ENCOUNTER — Ambulatory Visit (INDEPENDENT_AMBULATORY_CARE_PROVIDER_SITE_OTHER): Payer: Medicare Other | Admitting: Pharmacist Clinician (PhC)/ Clinical Pharmacy Specialist

## 2016-03-01 DIAGNOSIS — B182 Chronic viral hepatitis C: Secondary | ICD-10-CM

## 2016-03-01 NOTE — Progress Notes (Signed)
Patient ID: [name], male   DOB: 1955-12-18, 60 y.o.   MRN: [id number] HPI: Dustin [name] is a 60 y.o. male who is here for his pharmacy hep C visit.   No results found for: HCVGENOTYPE, HEPCGENOTYPE  Allergies: No Known Allergies  Vitals:    Past Medical History: Past Medical History  Diagnosis Date  . History of blood clots     to left arm  . Arthritis     bil knees, left hand  . DVT of upper extremity (deep vein thrombosis) (HCC)     left brachial vein, 12/2010  . Hypertension   . Shortness of breath dyspnea     with exertion    Social History: Social History   Social History  . Marital Status: Single    Spouse Name: N/A  . Number of Children: N/A  . Years of Education: N/A   Social History Main Topics  . Smoking status: Never Smoker   . Smokeless tobacco: Never Used  . Alcohol Use: No  . Drug Use: No  . Sexual Activity: Yes   Other Topics Concern  . Not on file   Social History Narrative    Labs: HEP B S AB (no units)  Date Value  12/02/2015 INDETER*   HEPATITIS B SURFACE AG (no units)  Date Value  12/02/2015 NEGATIVE   HCV AB (no units)  Date Value  11/18/2015 REACTIVE*    No results found for: HCVGENOTYPE, HEPCGENOTYPE  Hepatitis C RNA quantitative Latest Ref Rng 11/18/2015  HCV Quantitative <15 IU/mL [id number])  HCV Quantitative Log <1.18 log 10 6.62(H)    AST (U/L)  Date Value  12/02/2015 163*  03/18/2015 31  11/17/2014 27   ALT (U/L)  Date Value  12/02/2015 283*  03/18/2015 32  11/17/2014 31   INR (no units)  Date Value  12/02/2015 1.06  04/23/2015 1.12  03/18/2015 1.09    CrCl: CrCl cannot be calculated (Unknown ideal weight.).  Fibrosis Score: F3/4 as assessed by [facility]  Child-Pugh Score: Class A  Previous Treatment Regimen: None  Assessment: Niv started on zepatier on 4/13 so he is about 3 wks into therapy. He is doing really well on it without any side effects. No noted interactions on his  profile. He has not missed any doses. He has NS5A neg virus so he is on zepatier alone. Labs and MD appt will be scheduled today.  Recommendations:  Cont Zepatier 1 PO qday x 3 mo Hep C VL, CMP in 2 wks F/u with Dr. Linus Salmons in June  Pham, [place], [place].D., BCPS, AAHIVP Clinical Infectious [facility] 03/01/2016, 9:18 AM

## 2016-03-01 NOTE — Patient Instructions (Signed)
Continue Zepatier 1 tablet daily to complete 3 months Lab in 2 weeks Follow up with Dr. Linus Salmons in June

## 2016-03-02 MED FILL — *ZEPATIER 50-100 MG TABLET: 50-100 | 28 days supply | Qty: 28 | Fill #1

## 2016-03-14 ENCOUNTER — Other Ambulatory Visit: Payer: Medicare Other

## 2016-03-14 DIAGNOSIS — B182 Chronic viral hepatitis C: Secondary | ICD-10-CM

## 2016-03-14 LAB — COMPLETE METABOLIC PANEL WITH GFR
ALT: 17 U/L (ref 9–46)
AST: 22 U/L (ref 10–35)
Albumin: 4.5 g/dL (ref 3.6–5.1)
Alkaline Phosphatase: 69 U/L (ref 40–115)
BUN: 12 mg/dL (ref 7–25)
CHLORIDE: 101 mmol/L (ref 98–110)
CO2: 24 mmol/L (ref 20–31)
Calcium: 10 mg/dL (ref 8.6–10.3)
Creat: 1 mg/dL (ref 0.70–1.25)
GFR, EST NON AFRICAN AMERICAN: 81 mL/min (ref >=60)
GFR, Est African American: >89 mL/min (ref >=60)
GLUCOSE: 89 mg/dL (ref 65–99)
POTASSIUM: 4.1 mmol/L (ref 3.5–5.3)
SODIUM: 135 mmol/L (ref 135–146)
Total Bilirubin: 1 mg/dL (ref 0.2–1.2)
Total Protein: 7.9 g/dL (ref 6.1–8.1)

## 2016-03-15 LAB — HEPATITIS C RNA QUANTITATIVE: HCV Quantitative: NOT DETECTED IU/mL (ref <15)

## 2016-03-29 MED FILL — *ZEPATIER 50-100 MG TABLET: 50-100 | 28 days supply | Qty: 28 | Fill #2

## 2016-04-27 ENCOUNTER — Ambulatory Visit (INDEPENDENT_AMBULATORY_CARE_PROVIDER_SITE_OTHER): Payer: Medicare Other | Admitting: Internal Medicine

## 2016-04-27 VITALS — BP 152/76 | HR 62 | Temp 98.4°F | Wt 232.0 lb

## 2016-04-27 DIAGNOSIS — K7460 Unspecified cirrhosis of liver: Secondary | ICD-10-CM | POA: Insufficient documentation

## 2016-04-27 DIAGNOSIS — Z23 Encounter for immunization: Secondary | ICD-10-CM

## 2016-04-27 DIAGNOSIS — Z0000 Encounter for general adult medical examination without abnormal findings: Secondary | ICD-10-CM | POA: Diagnosis not present

## 2016-04-27 DIAGNOSIS — I10 Essential (primary) hypertension: Secondary | ICD-10-CM | POA: Diagnosis not present

## 2016-04-27 DIAGNOSIS — B182 Chronic viral hepatitis C: Secondary | ICD-10-CM | POA: Diagnosis not present

## 2016-04-27 NOTE — Assessment & Plan Note (Signed)
Discussed results of elastography and will refer to GI.  May also need colonoscopy with EGD.   Will do [place] screen every 6 months. Will check MRI of liver to confirm probable hemangioma.

## 2016-04-27 NOTE — Progress Notes (Signed)
   Subjective:    Patient ID: Dustin [name], male    DOB: [birth year], 60 y.o.   MRN: [id number]  HPI Here for follow up of HCV.    Has genotype 1a, no baseline resistance, elastography with F3/4.  Hemangioma on [place].  Started on zepatier and now on last bottle.  No issues and pleased with having it.  Early viral load undetectable.  No missed doses.  No alcohol.    Review of Systems  Constitutional: Negative for fatigue.  Gastrointestinal: Negative for diarrhea.  Skin: Negative for rash.  Neurological: Negative for headaches.       Objective:   Physical Exam  Constitutional: He appears well-developed and well-nourished. No distress.  Eyes: No scleral icterus.  Cardiovascular: Normal rate, regular rhythm and normal heart sounds.   Musculoskeletal: He exhibits no edema.  Skin: No rash noted.   Social History   Social History  . Marital Status: Single    Spouse Name: N/A  . Number of Children: N/A  . Years of Education: N/A   Occupational History  . Not on file.   Social History Main Topics  . Smoking status: Never Smoker   . Smokeless tobacco: Never Used  . Alcohol Use: No  . Drug Use: No  . Sexual Activity: Yes   Other Topics Concern  . Not on file   Social History Narrative         Assessment & Plan:

## 2016-04-27 NOTE — Assessment & Plan Note (Addendum)
Doing great.  Will check viral load after treatment and follow up in 6 weeks Hepatitis A #1 and B #2 today.

## 2016-04-27 NOTE — Assessment & Plan Note (Signed)
A little elevated.  He will discuss with his pcp

## 2016-04-28 MED ORDER — HEPATITIS A VACCINE 1440 EL U/ML IM SUSP: 1.0000 mL | Freq: Once | INTRAMUSCULAR | Status: DC

## 2016-04-28 NOTE — Addendum Note (Signed)
Addended by: Rodman Key A on: 04/28/2016 01:21 PM   Modules accepted: Orders

## 2016-05-08 ENCOUNTER — Encounter: Payer: Self-pay | Admitting: Gastroenterology

## 2016-05-15 IMAGING — CR DG KNEE 1-2V PORT*R*
2 series · 2 of 2 positions shown · non-contrast
Comparison: December 30, 2014.

CLINICAL DATA: Postop right knee replacement.

EXAM:
PORTABLE RIGHT KNEE - 1-2 VIEW

[AP]
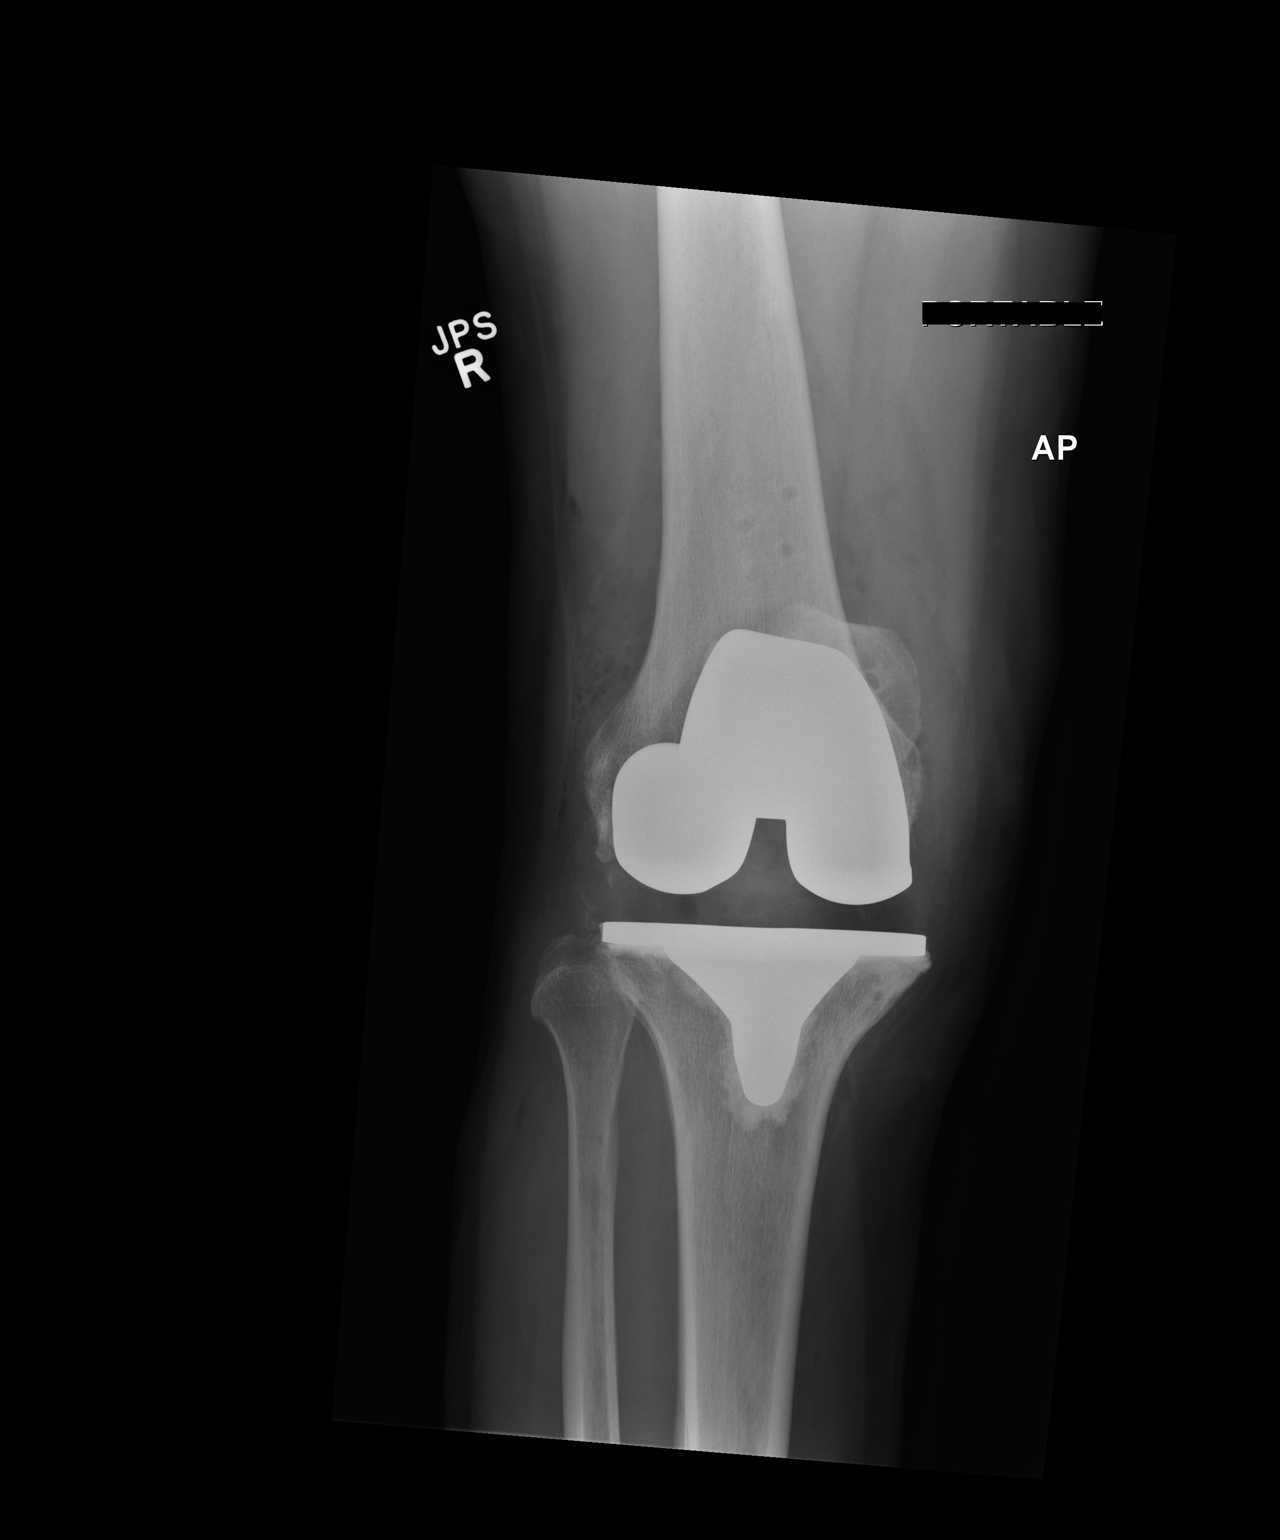

[xtable lateral]
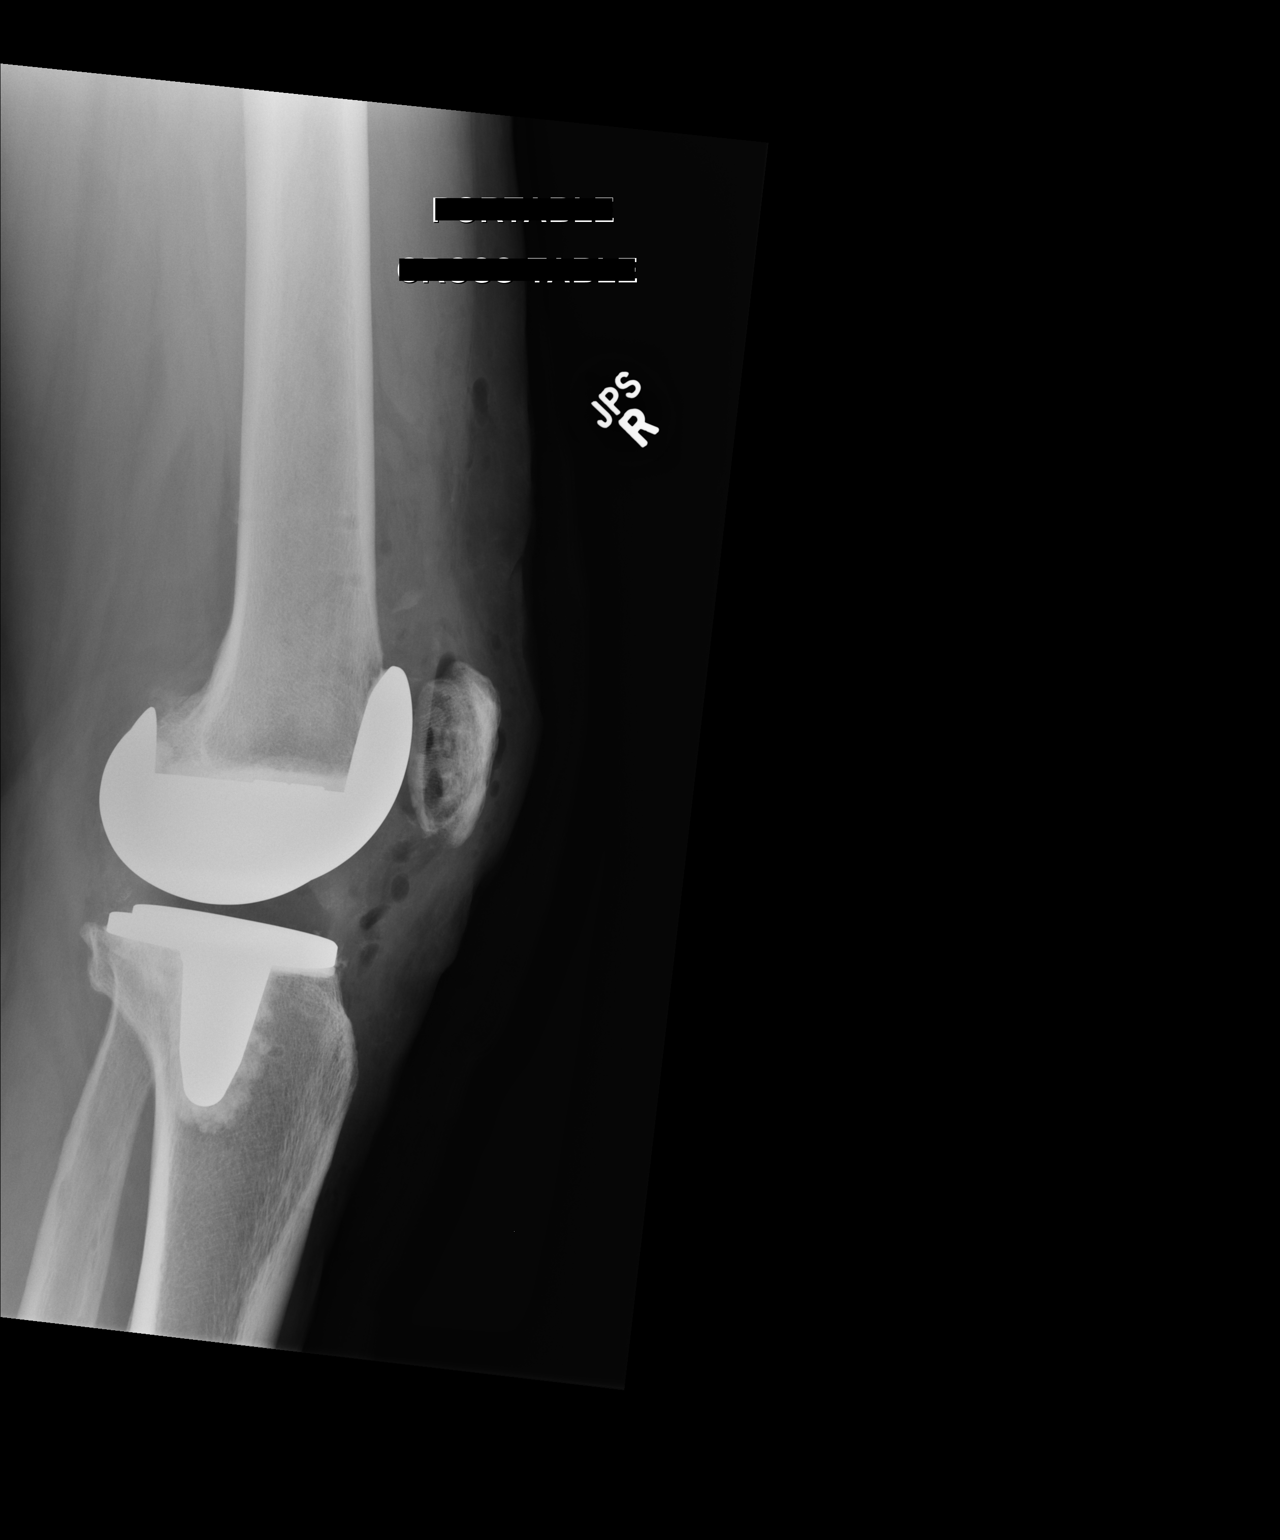

[2 of 2 positions shown; findings below may reference images not displayed]

FINDINGS: The femoral and tibial components appear to be well situated. No
fracture or dislocation is noted. Postoperative gas and fluid is
noted.
IMPRESSION: Status post right total knee arthroplasty.

## 2016-05-29 ENCOUNTER — Other Ambulatory Visit: Payer: Medicaid Other

## 2016-05-29 DIAGNOSIS — B182 Chronic viral hepatitis C: Secondary | ICD-10-CM | POA: Diagnosis not present

## 2016-05-30 LAB — HEPATITIS C RNA QUANTITATIVE: HCV Quantitative: NOT DETECTED IU/mL (ref <15)

## 2016-06-13 ENCOUNTER — Ambulatory Visit (INDEPENDENT_AMBULATORY_CARE_PROVIDER_SITE_OTHER): Payer: Medicaid Other | Admitting: Internal Medicine

## 2016-06-13 ENCOUNTER — Encounter: Payer: Self-pay | Admitting: Internal Medicine

## 2016-06-13 VITALS — BP 154/82 | HR 69 | Temp 98.3°F | Wt 229.0 lb

## 2016-06-13 DIAGNOSIS — K7460 Unspecified cirrhosis of liver: Secondary | ICD-10-CM

## 2016-06-13 DIAGNOSIS — B182 Chronic viral hepatitis C: Secondary | ICD-10-CM

## 2016-06-14 NOTE — Assessment & Plan Note (Signed)
SVR 12 in 3-4 months.

## 2016-06-14 NOTE — Progress Notes (Signed)
   Subjective:    Patient ID: Dustin [name], male    DOB: June 09, 1956, 60 y.o.   MRN: [id number]  HPI Here for follow up of HCV.    Has genotype 1a, no baseline resistance, elastography with F3/4.  Hemangioma on [place].  Started on zepatier and now completed.  No issues and pleased with having it.  Early viral load undetectable and end of treament undetectable.  No missed doses.  No alcohol. Has upcoming appt with GI   Review of Systems  Constitutional: Negative for fatigue.  Gastrointestinal: Negative for diarrhea.  Skin: Negative for rash.  Neurological: Negative for headaches.       Objective:   Physical Exam  Constitutional: He appears well-developed and well-nourished. No distress.  Eyes: No scleral icterus.  Cardiovascular: Normal rate, regular rhythm and normal heart sounds.   Musculoskeletal: He exhibits no edema.  Skin: No rash noted.   Social History   Social History  . Marital status: Single    Spouse name: N/A  . Number of children: N/A  . Years of education: N/A   Occupational History  . Not on file.   Social History Main Topics  . Smoking status: Never Smoker  . Smokeless tobacco: Never Used  . Alcohol use No  . Drug use: No  . Sexual activity: Yes   Other Topics Concern  . Not on file   Social History Narrative  . No narrative on file         Assessment & Plan:

## 2016-06-14 NOTE — Assessment & Plan Note (Addendum)
Will order [facility] screening for prior to next visit Will do pneumovax next visit.

## 2016-06-29 ENCOUNTER — Ambulatory Visit
Admission: RE | Admit: 2016-06-29 | Discharge: 2016-06-29 | Disposition: A | Discharge status: Home / Self Care | Payer: Medicare Other | Source: Ambulatory Visit | Attending: Internal Medicine | Admitting: Internal Medicine

## 2016-06-29 DIAGNOSIS — K7460 Unspecified cirrhosis of liver: Secondary | ICD-10-CM

## 2016-06-29 DIAGNOSIS — Z139 Encounter for screening, unspecified: Secondary | ICD-10-CM | POA: Diagnosis not present

## 2016-07-04 ENCOUNTER — Encounter: Payer: Self-pay | Admitting: Family Medicine

## 2016-07-04 ENCOUNTER — Ambulatory Visit: Payer: Medicare Other | Attending: Family Medicine | Admitting: Family Medicine

## 2016-07-04 VITALS — BP 154/80 | HR 66 | Temp 98.1°F | Wt 228.6 lb

## 2016-07-04 DIAGNOSIS — Z7982 Long term (current) use of aspirin: Secondary | ICD-10-CM | POA: Insufficient documentation

## 2016-07-04 DIAGNOSIS — Z23 Encounter for immunization: Secondary | ICD-10-CM | POA: Diagnosis not present

## 2016-07-04 DIAGNOSIS — Z96651 Presence of right artificial knee joint: Secondary | ICD-10-CM | POA: Insufficient documentation

## 2016-07-04 DIAGNOSIS — Z0000 Encounter for general adult medical examination without abnormal findings: Secondary | ICD-10-CM

## 2016-07-04 DIAGNOSIS — I10 Essential (primary) hypertension: Secondary | ICD-10-CM | POA: Insufficient documentation

## 2016-07-04 DIAGNOSIS — Z79899 Other long term (current) drug therapy: Secondary | ICD-10-CM | POA: Diagnosis not present

## 2016-07-04 MED ORDER — AMLODIPINE BESYLATE 10 MG PO TABS: 10.0000 mg | ORAL_TABLET | Freq: Every day | ORAL | 3 refills | Status: DC

## 2016-07-04 MED ORDER — HYDROCHLOROTHIAZIDE 12.5 MG PO CAPS: 12.5000 mg | ORAL_CAPSULE | Freq: Every day | ORAL | 3 refills | Status: DC

## 2016-07-04 MED ORDER — TRAMADOL HCL 50 MG PO TABS: 50.0000 mg | ORAL_TABLET | Freq: Two times a day (BID) | ORAL | 2 refills | Status: DC | PRN

## 2016-07-04 NOTE — Assessment & Plan Note (Signed)
A: chronic knee pain due to OA s/p R total knee replacement P: Tramadol given cirrhosis

## 2016-07-04 NOTE — Patient Instructions (Addendum)
Dustin [name] was seen today for hypertension.  Diagnoses and all orders for this visit:  Healthcare maintenance -     Ambulatory referral to Gastroenterology  Essential hypertension, benign -     hydrochlorothiazide (MICROZIDE) 12.5 MG capsule; Take 1 capsule (12.5 mg total) by mouth daily. -     amLODipine (NORVASC) 10 MG tablet; Take 1 tablet (10 mg total) by mouth daily.  Status post total right knee replacement -     traMADol (ULTRAM) 50 MG tablet; Take 1 tablet (50 mg total) by mouth every 12 (twelve) hours as needed for moderate pain.   F/u in 4 weeks for BP check with pharmacist or RN  F/u with me in 3 months for HTN  Dr. Adrian Blackwater   DASH Eating Plan DASH stands for "Dietary Approaches to Stop Hypertension." The DASH eating plan is a healthy eating plan that has been shown to reduce high blood pressure (hypertension). Additional health benefits may include reducing the risk of type 2 diabetes mellitus, heart disease, and stroke. The DASH eating plan may also help with weight loss. WHAT DO I NEED TO KNOW ABOUT THE DASH EATING PLAN? For the DASH eating plan, you will follow these general guidelines:  Choose foods with a percent daily value for sodium of less than 5% (as listed on the food label).  Use salt-free seasonings or herbs instead of table salt or sea salt.  Check with your health care provider or pharmacist before using salt substitutes.  Eat lower-sodium products, often labeled as "lower sodium" or "no salt added."  Eat fresh foods.  Eat more vegetables, fruits, and low-fat dairy products.  Choose whole grains. Look for the word "whole" as the first word in the ingredient list.  Choose fish and skinless chicken or [place] more often than red meat. Limit fish, poultry, and meat to 6 oz (170 g) each day.  Limit sweets, desserts, sugars, and sugary drinks.  Choose heart-healthy fats.  Limit cheese to 1 oz (28 g) per day.  Eat more home-cooked food and less  restaurant, buffet, and fast food.  Limit fried foods.  Cook foods using methods other than frying.  Limit canned vegetables. If you do use them, rinse them well to decrease the sodium.  When eating at a restaurant, ask that your food be prepared with less salt, or no salt if possible. WHAT FOODS CAN I EAT? Seek help from a dietitian for individual calorie needs. Grains Whole grain or whole wheat bread. Brown rice. Whole grain or whole wheat pasta. Quinoa, bulgur, and whole grain cereals. Low-sodium cereals. Corn or whole wheat flour tortillas. Whole grain cornbread. Whole grain crackers. Low-sodium crackers. Vegetables Fresh or frozen vegetables (raw, steamed, roasted, or grilled). Low-sodium or reduced-sodium tomato and vegetable juices. Low-sodium or reduced-sodium tomato sauce and paste. Low-sodium or reduced-sodium canned vegetables.  Fruits All fresh, canned (in natural juice), or frozen fruits. Meat and Other Protein Products Ground beef (85% or leaner), grass-fed beef, or beef trimmed of fat. Skinless chicken or [place]. Ground chicken or [place]. Pork trimmed of fat. All fish and seafood. Eggs. Dried beans, peas, or lentils. Unsalted nuts and seeds. Unsalted canned beans. Dairy Low-fat dairy products, such as skim or 1% milk, 2% or reduced-fat cheeses, low-fat ricotta or cottage cheese, or plain low-fat yogurt. Low-sodium or reduced-sodium cheeses. Fats and Oils Tub margarines without trans fats. Light or reduced-fat mayonnaise and salad dressings (reduced sodium). Avocado. Safflower, olive, or canola oils. Natural peanut or almond butter. Other Unsalted popcorn  and pretzels. The items listed above may not be a complete list of recommended foods or beverages. Contact your dietitian for more options. WHAT FOODS ARE NOT RECOMMENDED? Grains White bread. White pasta. White rice. Refined cornbread. Bagels and croissants. Crackers that contain trans fat. Vegetables Creamed or fried  vegetables. Vegetables in a cheese sauce. Regular canned vegetables. Regular canned tomato sauce and paste. Regular tomato and vegetable juices. Fruits Dried fruits. Canned fruit in light or heavy syrup. Fruit juice. Meat and Other Protein Products Fatty cuts of meat. Ribs, chicken wings, bacon, sausage, bologna, salami, chitterlings, fatback, hot dogs, bratwurst, and packaged luncheon meats. Salted nuts and seeds. Canned beans with salt. Dairy Whole or 2% milk, cream, half-and-half, and cream cheese. Whole-fat or sweetened yogurt. Full-fat cheeses or blue cheese. Nondairy creamers and whipped toppings. Processed cheese, cheese spreads, or cheese curds. Condiments Onion and garlic salt, seasoned salt, table salt, and sea salt. Canned and packaged gravies. Worcestershire sauce. Tartar sauce. Barbecue sauce. Teriyaki sauce. Soy sauce, including reduced sodium. Steak sauce. Fish sauce. Oyster sauce. Cocktail sauce. Horseradish. Ketchup and mustard. Meat flavorings and tenderizers. Bouillon cubes. Hot sauce. Tabasco sauce. Marinades. Taco seasonings. Relishes. Fats and Oils Butter, stick margarine, lard, shortening, ghee, and bacon fat. Coconut, palm kernel, or palm oils. Regular salad dressings. Other Pickles and olives. Salted popcorn and pretzels. The items listed above may not be a complete list of foods and beverages to avoid. Contact your dietitian for more information. WHERE CAN I FIND MORE INFORMATION? National Heart, Lung, and Blood Institute: travelstabloid.com   This information is not intended to replace advice given to you by your health care provider. Make sure you discuss any questions you have with your health care provider.   Document Released: 10/05/2011 Document Revised: 11/06/2014 Document Reviewed: 08/20/2013 Elsevier Interactive Patient Education [employer].

## 2016-07-04 NOTE — Assessment & Plan Note (Signed)
A: elevated BP Med: compliant P: Add HCTZ 12.5 mg daily to norvasc 10 mg daily Reduce salt in diet continue exercise

## 2016-07-04 NOTE — Progress Notes (Signed)
Subjective:  Patient ID: Dustin [name], male    DOB: 04-Aug-1956  Age: 60 y.o. MRN: [id number]  CC: Hypertension   HPI Dustin [name] has HTN, cirrhosis, presents for   1. CHRONIC HYPERTENSION  Disease Monitoring  Blood pressure range: not checking but 150s/90s at home   Chest pain: no   Dyspnea: no   Claudication: no   Medication compliance: yes  Medication Side Effects  Lightheadedness: no   Urinary frequency: no   Edema: no   Impotence: no   Preventitive Healthcare:  Exercise: yes, gym 2-3 times per week for 45 mins    Diet Pattern: eating 3 meals a day and a lot of snacks high in salt   Salt Restriction: no   2. Knee pain: pain in both knees. R >L. He is s/p R total knee replacement surgery. Pain comes and goes he take tylenol for pain.   Social History  Substance Use Topics  . Smoking status: Never Smoker  . Smokeless tobacco: Never Used  . Alcohol use No    Outpatient Medications Prior to Visit  Medication Sig Dispense Refill  . amLODipine (NORVASC) 10 MG tablet Take 1 tablet (10 mg total) by mouth daily. 90 tablet 3  . aspirin EC 325 MG tablet Take 1 tablet (325 mg total) by mouth daily. 30 tablet 0   No facility-administered medications prior to visit.     ROS Review of Systems  Constitutional: Negative for chills, fatigue, fever and unexpected weight change.  Eyes: Negative for visual disturbance.  Respiratory: Negative for cough and shortness of breath.   Cardiovascular: Negative for chest pain, palpitations and leg swelling.  Gastrointestinal: Negative for abdominal pain, blood in stool, constipation, diarrhea, nausea and vomiting.  Endocrine: Negative for polydipsia, polyphagia and polyuria.  Musculoskeletal: Negative for arthralgias, back pain, gait problem, myalgias and neck pain.  Skin: Negative for rash.  Allergic/Immunologic: Negative for immunocompromised state.  Neurological: Positive for dizziness.  Hematological: Negative for  adenopathy. Does not bruise/bleed easily.  Psychiatric/Behavioral: Negative for dysphoric mood, sleep disturbance and suicidal ideas. The patient is not nervous/anxious.     Objective:  BP (!) 154/80 (BP Location: Right Arm, Patient Position: Sitting, Cuff Size: Small)   Pulse 66   Temp 98.1 F (36.7 C) (Oral)   Wt 228 lb 9.6 oz (103.7 kg)   SpO2 95%   BMI 32.34 kg/m   BP/Weight 07/04/2016 06/13/2016 123456  Systolic BP 123456 [id number]  Diastolic BP 80 82 76  Wt. (Lbs) 228.6 229 232  BMI 32.34 32.39 32.81    Physical Exam  Constitutional: He appears well-developed and well-nourished. No distress.  HENT:  Head: Normocephalic and atraumatic.  Neck: Normal range of motion. Neck supple.  Cardiovascular: Normal rate, regular rhythm, normal heart sounds and intact distal pulses.   Pulmonary/Chest: Effort normal and breath sounds normal.  Musculoskeletal: He exhibits no edema.       Right knee: He exhibits no swelling.       Left knee: He exhibits no swelling.       Legs: Neurological: He is alert.  Skin: Skin is warm and dry. No rash noted. No erythema.  Psychiatric: He has a normal mood and affect.    Assessment & Plan:  Denman was seen today for hypertension.  Diagnoses and all orders for this visit:  Healthcare maintenance -     Ambulatory referral to Gastroenterology  Essential hypertension, benign -     hydrochlorothiazide (MICROZIDE) 12.5 MG capsule;  Take 1 capsule (12.5 mg total) by mouth daily. -     amLODipine (NORVASC) 10 MG tablet; Take 1 tablet (10 mg total) by mouth daily.  Status post total right knee replacement -     traMADol (ULTRAM) 50 MG tablet; Take 1 tablet (50 mg total) by mouth every 12 (twelve) hours as needed for moderate pain.   There are no diagnoses linked to this encounter.  No orders of the defined types were placed in this encounter.   Follow-up: Return in about 4 weeks (around 08/01/2016) for BP check .   Boykin Nearing MD

## 2016-07-11 ENCOUNTER — Encounter: Payer: Self-pay | Admitting: Gastroenterology

## 2016-07-11 ENCOUNTER — Ambulatory Visit (INDEPENDENT_AMBULATORY_CARE_PROVIDER_SITE_OTHER): Payer: Medicare Other | Admitting: Gastroenterology

## 2016-07-11 VITALS — BP 120/80 | HR 72 | Ht 70.5 in | Wt 226.0 lb

## 2016-07-11 DIAGNOSIS — Z1211 Encounter for screening for malignant neoplasm of colon: Secondary | ICD-10-CM | POA: Diagnosis not present

## 2016-07-11 DIAGNOSIS — R935 Abnormal findings on diagnostic imaging of other abdominal regions, including retroperitoneum: Secondary | ICD-10-CM | POA: Diagnosis not present

## 2016-07-11 DIAGNOSIS — Z1212 Encounter for screening for malignant neoplasm of rectum: Secondary | ICD-10-CM | POA: Diagnosis not present

## 2016-07-11 DIAGNOSIS — K7400 Hepatic fibrosis, unspecified: Secondary | ICD-10-CM

## 2016-07-11 MED ORDER — NA SULFATE-K SULFATE-MG SULF 17.5-3.13-1.6 GM/177ML PO SOLN: 1.0000 | Freq: Once | ORAL | 0 refills | Status: AC

## 2016-07-11 NOTE — Patient Instructions (Signed)
You have been scheduled for an endoscopy and colonoscopy. Please follow the written instructions given to you at your visit today. Please pick up your prep supplies at the pharmacy within the next 1-3 days. If you use inhalers (even only as needed), please bring them with you on the day of your procedure. Your physician has requested that you go to www.startemmi.com and enter the access code given to you at your visit today. This web site gives a general overview about your procedure. However, you should still follow specific instructions given to you by our office regarding your preparation for the procedure.  You have been scheduled for an MRI of the liver at [facility] on 07/18/16. Your appointment time is 9:00am. Please arrive 15 minutes prior to your appointment time for registration purposes. Please make certain not to have anything to eat or drink 6 hours prior to your test. In addition, if you have any metal in your body, have a pacemaker or defibrillator, please be sure to let your ordering physician know. This test typically takes 45 minutes to 1 hour to complete.    Thank you for choosing me and [facility].  Pricilla Riffle. Dagoberto Ligas., MD., Marval Regal

## 2016-07-11 NOTE — Progress Notes (Signed)
    History of Present Illness: This is a 60 year old male referred by Thayer Headings, MD for the evaluation of an abnormal abdominal ultrasound of the liver. He was recently treated for HCV genotype 1a with a 12 week course of Harvoni. Two ultrasound exams below. Denies weight loss, abdominal pain, constipation, diarrhea, change in stool caliber, melena, hematochezia, nausea, vomiting, dysphagia, reflux symptoms, chest pain.  RUQ ultrasound 05/2016 IMPRESSION: Stable 8 mm hyperechoic focus adjacent to the gallbladder which is most compatible with a benign hemangioma. Again, given the history of hepatitis-C, hepatic protocol MRI is recommended. No significant hepatic echotexture abnormality.  Abdominal ultrasound with elastography 01/2016 IMPRESSION: 1. Small 1 cm hyperechoic focus in the right hepatic lobe. Although statistically likely to be a hemangioma the appearance is nonspecific, and in the context of the hepatitis c probably warrants hepatic protocol MRI with and without contrast for further investigation.  Median hepatic shear wave velocity is calculated at 2.66 m/sec.  Corresponding Metavir fibrosis score is Some F3+F4 .  Risk of fibrosis is high.  Follow-up:  Advised   Review of Systems: Pertinent positive and negative review of systems were noted in the above HPI section. All other review of systems were otherwise negative.  Current Medications, Allergies, Past Medical History, Past Surgical History, Family History and Social History were reviewed in [employer] record.  Physical Exam: General: Well developed, well nourished, no acute distress Head: Normocephalic and atraumatic Eyes:  sclerae anicteric, EOMI Ears: Normal auditory acuity Mouth: No deformity or lesions Neck: Supple, no masses or thyromegaly Lungs: Clear throughout to auscultation Heart: Regular rate and rhythm; no murmurs, rubs or bruits Abdomen: Soft, non tender and non distended.  No masses, hepatosplenomegaly or hernias noted. Normal Bowel sounds Rectal: deferred to colonoscopy Musculoskeletal: Symmetrical with no gross deformities  Skin: No lesions on visible extremities Pulses:  Normal pulses noted Extremities: No clubbing, cyanosis, edema or deformities noted Neurological: Alert oriented x 4, grossly nonfocal Cervical Nodes:  No significant cervical adenopathy Inguinal Nodes: No significant inguinal adenopathy Psychological:  Alert and cooperative. Normal mood and affect  Assessment and Recommendations:  1. Hepatitis C with F3/F4 fibrosis. Small liver lesion on [place] is very likely a small hemangioma however will proceed with MRI for further evaluation. No blood test or physical exam evidence of cirrhosis/portal hypertension however, given the fibrosis score, early cirrhosis is possible Schedule EGD to screen for varices. Avoid etoh use. The risks (including bleeding, perforation, infection, missed lesions, medication reactions and possible hospitalization or surgery if complications occur), benefits, and alternatives to endoscopy with possible biopsy and possible dilation were discussed with the patient and they consent to proceed.   2. Colorectal cancer screening, average risk. Schedule colonoscopy. The risks (including bleeding, perforation, infection, missed lesions, medication reactions and possible hospitalization or surgery if complications occur), benefits, and alternatives to colonoscopy with possible biopsy and possible polypectomy were discussed with the patient and they consent to proceed.    cc: Thayer Headings, MD [address] [place], [place] [address]

## 2016-07-18 ENCOUNTER — Ambulatory Visit (HOSPITAL_COMMUNITY)
Admission: RE | Admit: 2016-07-18 | Discharge: 2016-07-18 | Disposition: A | Discharge status: Home / Self Care | Payer: Medicare Other | Source: Ambulatory Visit | Attending: Gastroenterology | Admitting: Gastroenterology

## 2016-07-18 DIAGNOSIS — R935 Abnormal findings on diagnostic imaging of other abdominal regions, including retroperitoneum: Secondary | ICD-10-CM | POA: Insufficient documentation

## 2016-07-18 DIAGNOSIS — K769 Liver disease, unspecified: Secondary | ICD-10-CM | POA: Diagnosis not present

## 2016-07-18 DIAGNOSIS — B1920 Unspecified viral hepatitis C without hepatic coma: Secondary | ICD-10-CM | POA: Diagnosis not present

## 2016-07-18 LAB — POCT I-STAT CREATININE: Creatinine, Ser: 1.1 mg/dL (ref 0.61–1.24)

## 2016-07-18 MED ORDER — GADOBENATE DIMEGLUMINE 529 MG/ML IV SOLN
20.0000 mL | Freq: Once | INTRAVENOUS | Status: AC | PRN
  Administered 2016-07-18: 20 mL via INTRAVENOUS

## 2016-07-31 NOTE — Progress Notes (Signed)
   S:    Patient arrives in good spirits.  Presents to the clinic for hypertension evaluation. Patient was referred on 07/04/16 by Dr. Adrian Blackwater.  Patient was last seen by Primary Care Provider on 07/04/16.   Patient reports adherence with medications. He denies any adverse effects.  Current BP Medications include:  Amlodipine 10 mg daily and HCTZ 12.5 mg daily.   Antihypertensives tried in the past include: valsartan-HCTZ  He reports that he will check his blood pressure at the gym right after he works out and sometimes is 140s-150s/80s-90s. He is concerned that this is too high.    O:   Last 3 Office BP readings: BP Readings from Last 3 Encounters:  08/01/16 136/88  07/11/16 120/80  07/04/16 (!) 154/80    BMET    Component Value Date/Time   NA 135 03/14/2016 1114   K 4.1 03/14/2016 1114   CL 101 03/14/2016 1114   CO2 24 03/14/2016 1114   GLUCOSE 89 03/14/2016 1114   BUN 12 03/14/2016 1114   CREATININE 1.10 07/18/2016 0913   CREATININE 1.00 03/14/2016 1114   CALCIUM 10.0 03/14/2016 1114   GFRNONAA 81 03/14/2016 1114   GFRAA >89 03/14/2016 1114    A/P: Hypertension longstanding currently controlled on current medications.  Continued current medications as prescribed. Educated on things that can elevate blood pressure (immediately after strenous exercise, caffeine, smoking). Patient verbalized understanding.  Results reviewed and written information provided.   Total time in face-to-face counseling 15 minutes.   F/U Clinic Visit with Dr. Adrian Blackwater.  Patient seen with Biagio Borg, PharmD Candidate.

## 2016-08-01 ENCOUNTER — Ambulatory Visit: Payer: Medicare Other | Attending: Family Medicine | Admitting: Pharmacist

## 2016-08-01 ENCOUNTER — Encounter: Payer: Self-pay | Admitting: Pharmacist

## 2016-08-01 VITALS — BP 136/88

## 2016-08-01 DIAGNOSIS — Z79899 Other long term (current) drug therapy: Secondary | ICD-10-CM | POA: Diagnosis not present

## 2016-08-01 DIAGNOSIS — I10 Essential (primary) hypertension: Secondary | ICD-10-CM | POA: Insufficient documentation

## 2016-08-01 NOTE — Patient Instructions (Addendum)
Thanks for coming to see me!  Your blood pressure was great today!  It might be a little high at the gym so just let us know if you have any other questions!  If you are resting, your blood pressure should be <150/90   Hypertension Hypertension is another name for high blood pressure. High blood pressure forces your heart to work harder to pump blood. A blood pressure reading has two numbers, which includes a higher number over a lower number (example: 110/72). HOME CARE   Have your blood pressure rechecked by your doctor.  Only take medicine as told by your doctor. Follow the directions carefully. The medicine does not work as well if you skip doses. Skipping doses also puts you at risk for problems.  Do not smoke.  Monitor your blood pressure at home as told by your doctor. GET HELP IF:  You think you are having a reaction to the medicine you are taking.  You have repeat headaches or feel dizzy.  You have puffiness (swelling) in your ankles.  You have trouble with your vision. GET HELP RIGHT AWAY IF:   You get a very bad headache and are confused.  You feel weak, numb, or faint.  You get chest or belly (abdominal) pain.  You throw up (vomit).  You cannot breathe very well. MAKE SURE YOU:   Understand these instructions.  Will watch your condition.  Will get help right away if you are not doing well or get worse.   This information is not intended to replace advice given to you by your health care provider. Make sure you discuss any questions you have with your health care provider.   Document Released: 04/03/2008 Document Revised: 10/21/2013 Document Reviewed: 08/08/2013 Elsevier Interactive Patient Education [employer].

## 2016-08-08 ENCOUNTER — Encounter: Payer: Self-pay | Admitting: Gastroenterology

## 2016-08-08 ENCOUNTER — Ambulatory Visit (AMBULATORY_SURGERY_CENTER): Payer: Medicare Other | Admitting: Gastroenterology

## 2016-08-08 VITALS — BP 117/64 | HR 58 | Temp 99.3°F | Resp 11 | Ht 70.0 in | Wt 226.0 lb

## 2016-08-08 DIAGNOSIS — Z1212 Encounter for screening for malignant neoplasm of rectum: Secondary | ICD-10-CM | POA: Diagnosis not present

## 2016-08-08 DIAGNOSIS — Z1211 Encounter for screening for malignant neoplasm of colon: Secondary | ICD-10-CM | POA: Diagnosis not present

## 2016-08-08 DIAGNOSIS — K7460 Unspecified cirrhosis of liver: Secondary | ICD-10-CM | POA: Diagnosis not present

## 2016-08-08 DIAGNOSIS — R932 Abnormal findings on diagnostic imaging of liver and biliary tract: Secondary | ICD-10-CM | POA: Diagnosis not present

## 2016-08-08 DIAGNOSIS — B9681 Helicobacter pylori [H. pylori] as the cause of diseases classified elsewhere: Secondary | ICD-10-CM | POA: Diagnosis not present

## 2016-08-08 DIAGNOSIS — D122 Benign neoplasm of ascending colon: Secondary | ICD-10-CM | POA: Diagnosis not present

## 2016-08-08 DIAGNOSIS — B1920 Unspecified viral hepatitis C without hepatic coma: Secondary | ICD-10-CM | POA: Diagnosis not present

## 2016-08-08 DIAGNOSIS — R935 Abnormal findings on diagnostic imaging of other abdominal regions, including retroperitoneum: Secondary | ICD-10-CM | POA: Diagnosis not present

## 2016-08-08 DIAGNOSIS — K2970 Gastritis, unspecified, without bleeding: Secondary | ICD-10-CM

## 2016-08-08 DIAGNOSIS — I10 Essential (primary) hypertension: Secondary | ICD-10-CM | POA: Diagnosis not present

## 2016-08-08 DIAGNOSIS — K2990 Gastroduodenitis, unspecified, without bleeding: Secondary | ICD-10-CM

## 2016-08-08 MED ORDER — SODIUM CHLORIDE 0.9 % IV SOLN: 500.0000 mL | INTRAVENOUS | Status: DC

## 2016-08-08 NOTE — Op Note (Signed)
[name] Procedure Date: 08/08/2016 3:42 PM MRN: [id number] Endoscopist: Ladene Artist , MD Age: 60 Referring MD:  Date of Birth: Aug 05, 1956 Gender: Male Account #: [id number] Procedure:                Upper GI endoscopy Indications:              Screening procedure, Abnormal MRI of the liver,                            Cirrhosis, screening for varices Medicines:                Monitored Anesthesia Care Procedure:                Pre-Anesthesia Assessment:                           - Prior to the procedure, a History and Physical                            was performed, and patient medications and                            allergies were reviewed. The patient's tolerance of                            previous anesthesia was also reviewed. The risks                            and benefits of the procedure and the sedation                            options and risks were discussed with the patient.                            All questions were answered, and informed consent                            was obtained. Prior Anticoagulants: The patient has                            taken no previous anticoagulant or antiplatelet                            agents. ASA Grade Assessment: II - A patient with                            mild systemic disease. After reviewing the risks                            and benefits, the patient was deemed in                            satisfactory condition to undergo the procedure.  After obtaining informed consent, the endoscope was                            passed under direct vision. Throughout the                            procedure, the patient's blood pressure, pulse, and                            oxygen saturations were monitored continuously. The                            Model GIF-[id number]) scope was introduced                            through the mouth, and  advanced to the second part                            of duodenum. The upper GI endoscopy was                            accomplished without difficulty. The patient                            tolerated the procedure well. Scope In: Scope Out: Findings:                 The examined esophagus was normal.                           A single 7 mm submucosal papule (nodule) with no                            bleeding and no stigmata of recent bleeding was                            found on the greater curvature of the gastric                            antrum. Biopsies were taken with a cold forceps for                            histology.                           Diffuse moderate inflammation characterized by                            erythema and granularity was found in the entire                            examined stomach. Biopsies were taken with a cold                            forceps for histology.  The exam of the stomach was otherwise normal.                           The duodenal bulb and second portion of the                            duodenum were normal. Complications:            No immediate complications. Estimated Blood Loss:     Estimated blood loss was minimal. Impression:               - Normal esophagus.                           - A single submucosal papule (nodule) found in the                            stomach. Biopsied.                           - Gastritis. Biopsied.                           - Normal duodenal bulb and second portion of the                            duodenum. Recommendation:           - Patient has a contact number available for                            emergencies. The signs and symptoms of potential                            delayed complications were discussed with the                            patient. Return to normal activities tomorrow.                            Written discharge instructions were  provided to the                            patient.                           - Resume previous diet.                           - Continue present medications.                           - Await pathology results.                           - Repeat upper endoscopy in 2 years to screen for  varices. Ladene Artist, MD 08/08/2016 3:55:57 PM This report has been signed electronically.

## 2016-08-08 NOTE — Progress Notes (Signed)
Report to PACU, RN, vss, BBS= Clear.  

## 2016-08-08 NOTE — Patient Instructions (Signed)
YOU HAD AN ENDOSCOPIC PROCEDURE TODAY AT [facility]:   Refer to the procedure report that was given to you for any specific questions about what was found during the examination.  If the procedure report does not answer your questions, please call your gastroenterologist to clarify.  If you requested that your care partner not be given the details of your procedure findings, then the procedure report has been included in a sealed envelope for you to review at your convenience later.  YOU SHOULD EXPECT: Some feelings of bloating in the abdomen. Passage of more gas than usual.  Walking can help get rid of the air that was put into your GI tract during the procedure and reduce the bloating. If you had a lower endoscopy (such as a colonoscopy or flexible sigmoidoscopy) you may notice spotting of blood in your stool or on the toilet paper. If you underwent a bowel prep for your procedure, you may not have a normal bowel movement for a few days.  Please Note:  You might notice some irritation and congestion in your nose or some drainage.  This is from the oxygen used during your procedure.  There is no need for concern and it should clear up in a day or so.  SYMPTOMS TO REPORT IMMEDIATELY:   Following lower endoscopy (colonoscopy or flexible sigmoidoscopy):  Excessive amounts of blood in the stool  Significant tenderness or worsening of abdominal pains  Swelling of the abdomen that is new, acute  Fever of 100F or higher   Following upper endoscopy (EGD)  Vomiting of blood or coffee ground material  New chest pain or pain under the shoulder blades  Painful or persistently difficult swallowing  New shortness of breath  Fever of 100F or higher  Black, tarry-looking stools  For urgent or emergent issues, a gastroenterologist can be reached at any hour by calling [phone].   DIET:  We do recommend a small meal at first, but then you may proceed to your regular diet.  Drink  plenty of fluids but you should avoid alcoholic beverages for 24 hours.  ACTIVITY:  You should plan to take it easy for the rest of today and you should NOT DRIVE or use heavy machinery until tomorrow (because of the sedation medicines used during the test).    FOLLOW UP: Our staff will call the number listed on your records the next business day following your procedure to check on you and address any questions or concerns that you may have regarding the information given to you following your procedure. If we do not reach you, we will leave a message.  However, if you are feeling well and you are not experiencing any problems, there is no need to return our call.  We will assume that you have returned to your regular daily activities without incident.  If any biopsies were taken you will be contacted by phone or by letter within the next 1-3 weeks.  Please call us at [phone] if you have not heard about the biopsies in 3 weeks.    SIGNATURES/CONFIDENTIALITY: You and/or your care partner have signed paperwork which will be entered into your electronic medical record.  These signatures attest to the fact that that the information above on your After Visit Summary has been reviewed and is understood.  Full responsibility of the confidentiality of this discharge information lies with you and/or your care-partner.    Resume medications. Information given on polyps,diverticulosis,high fiber diet and  gastritis.

## 2016-08-08 NOTE — Op Note (Signed)
[facility] Patient Name: Dustin [name] Procedure Date: 08/08/2016 3:19 PM MRN: [id number] Endoscopist: Ladene Artist , MD Age: 60 Referring MD:  Date of Birth: 10-19-1956 Gender: Male Account #: [id number] Procedure:                Colonoscopy Indications:              Screening for colorectal malignant neoplasm Medicines:                Monitored Anesthesia Care Procedure:                Pre-Anesthesia Assessment:                           - Prior to the procedure, a History and Physical                            was performed, and patient medications and                            allergies were reviewed. The patient's tolerance of                            previous anesthesia was also reviewed. The risks                            and benefits of the procedure and the sedation                            options and risks were discussed with the patient.                            All questions were answered, and informed consent                            was obtained. Prior Anticoagulants: The patient has                            taken no previous anticoagulant or antiplatelet                            agents. ASA Grade Assessment: II - A patient with                            mild systemic disease. After reviewing the risks                            and benefits, the patient was deemed in                            satisfactory condition to undergo the procedure.                           After obtaining informed consent, the colonoscope  was passed under direct vision. Throughout the                            procedure, the patient's blood pressure, pulse, and                            oxygen saturations were monitored continuously. The                            Model PCF-H190L [id number]) scope was introduced                            through the anus and advanced to the the cecum,                            identified by  appendiceal orifice and ileocecal                            valve. The ileocecal valve, appendiceal orifice,                            and rectum were photographed. The quality of the                            bowel preparation was good. The colonoscopy was                            performed without difficulty. The patient tolerated                            the procedure well. Scope In: 3:26:02 PM Scope Out: 3:41:01 PM Scope Withdrawal Time: 0 hours 13 minutes 16 seconds  Total Procedure Duration: 0 hours 14 minutes 59 seconds  Findings:                 The perianal and digital rectal examinations were                            normal.                           A 6 mm polyp was found in the ascending colon. The                            polyp was sessile. The polyp was removed with a                            cold snare. Resection and retrieval were complete.                           A few small-mouthed diverticula were found in the                            sigmoid colon.  The exam was otherwise without abnormality on                            direct and retroflexion views. Complications:            No immediate complications. Estimated blood loss:                            None. Estimated Blood Loss:     Estimated blood loss: none. Impression:               - One 6 mm polyp in the ascending colon, removed                            with a cold snare. Resected and retrieved.                           - Diverticulosis in the sigmoid colon.                           - The examination was otherwise normal on direct                            and retroflexion views. Recommendation:           - Repeat colonoscopy in 5 years for surveillance if                            polyp is precancerous, otherwise 10 years.                           - Patient has a contact number available for                            emergencies. The signs and symptoms of  potential                            delayed complications were discussed with the                            patient. Return to normal activities tomorrow.                            Written discharge instructions were provided to the                            patient.                           - High fiberdiet.                           - Continue present medications.                           - Await pathology results. Ladene Artist, MD 08/08/2016 3:43:33 PM This report has been  signed electronically.

## 2016-08-09 ENCOUNTER — Telehealth: Payer: Self-pay

## 2016-08-09 NOTE — Telephone Encounter (Signed)
  Follow up Call-  Call back number 08/08/2016  Post procedure Call Back phone  # [phone]  Permission to leave phone message Yes  Some recent data might be hidden    Patient was called for follow up after his procedure on 08/08/2016. No answer at the number given for follow up phone call. A message was left on the answering machine.

## 2016-08-09 NOTE — Telephone Encounter (Signed)
  Follow up Call-  Call back number 08/08/2016  Post procedure Call Back phone  # [phone]  Permission to leave phone message Yes  Some recent data might be hidden     Patient questions:  Do you have a fever, pain , or abdominal swelling? No. Pain Score  0 *  Have you tolerated food without any problems? Yes.    Have you been able to return to your normal activities? Yes.    Do you have any questions about your discharge instructions: Diet   No. Medications  No. Follow up visit  No.  Do you have questions or concerns about your Care? No.  Actions: * If pain score is 4 or above: No action needed, pain <4.

## 2016-08-30 ENCOUNTER — Other Ambulatory Visit: Payer: Medicare Other

## 2016-08-30 DIAGNOSIS — B182 Chronic viral hepatitis C: Secondary | ICD-10-CM

## 2016-08-31 LAB — HEPATITIS C RNA QUANTITATIVE: HCV QUANT: NOT DETECTED [IU]/mL (ref <15)

## 2016-09-05 ENCOUNTER — Other Ambulatory Visit: Payer: Self-pay

## 2016-09-05 ENCOUNTER — Telehealth: Payer: Self-pay | Admitting: Gastroenterology

## 2016-09-05 MED ORDER — BIS SUBCIT-METRONID-TETRACYC 140-125-125 MG PO CAPS: 3.0000 | ORAL_CAPSULE | Freq: Three times a day (TID) | ORAL | 0 refills | Status: DC

## 2016-09-05 NOTE — Telephone Encounter (Signed)
See phone note for details 

## 2016-09-06 ENCOUNTER — Ambulatory Visit (INDEPENDENT_AMBULATORY_CARE_PROVIDER_SITE_OTHER): Payer: Medicare Other | Admitting: Gastroenterology

## 2016-09-06 VITALS — BP 144/66 | HR 74 | Ht 69.0 in | Wt 223.0 lb

## 2016-09-06 DIAGNOSIS — K2970 Gastritis, unspecified, without bleeding: Secondary | ICD-10-CM | POA: Diagnosis not present

## 2016-09-06 DIAGNOSIS — D3A092 Benign carcinoid tumor of the stomach: Secondary | ICD-10-CM

## 2016-09-06 DIAGNOSIS — B9681 Helicobacter pylori [H. pylori] as the cause of diseases classified elsewhere: Secondary | ICD-10-CM | POA: Diagnosis not present

## 2016-09-06 NOTE — Patient Instructions (Signed)
Take Pylera samples 3 capsules by mouth four times a day x 14 days along with the Nexium twice daily x 14 days. We will contact you with the rest of the 4 days of samples of Pylera. We will contact you when we receive the other samples of Pylera to finish the 14 days.   We will contact you with the referral to [facility].  Thank you for choosing me and [facility].  Pricilla Riffle. Dagoberto Ligas., MD., Marval Regal

## 2016-09-06 NOTE — Progress Notes (Addendum)
    History of Present Illness: This is a 60 year old male returning for follow-up of. H. 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 He is asymptomatic.  Current Medications, Allergies, Past Medical History, Past Surgical History, Family History and Social History were reviewed in [employer] record.  Physical Exam: General: Well developed, well nourished, no acute distress Head: Normocephalic and atraumatic Eyes:  sclerae anicteric, EOMI Ears: Normal auditory acuity Mouth: No deformity or lesions Lungs: Clear throughout to auscultation Heart: Regular rate and rhythm; no murmurs, rubs or bruits Abdomen: Soft, non tender and non distended. No masses, hepatosplenomegaly or hernias noted. Normal Bowel sounds Musculoskeletal: Symmetrical with no gross deformities  Pulses:  Normal pulses noted Extremities: No clubbing, cyanosis, edema or deformities noted Neurological: Alert oriented x 4, grossly nonfocal Psychological:  Alert and cooperative. Normal mood and affect  Assessment and Recommendations:  1. 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 He is advised to avoid alcohol. Return office visit in one year.  2. H. pylori gastritis. Complete 14 days of Pylera 3 by mouth 4 times a day and Nexium 20 mg by mouth twice a day for 14 days.  3. 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 Discussed referral to a tertiary center for evaluation and consideration of resection of the gastric carcinoid.  4. One adenomatous colon polyp on recent colonoscopy. Five-year interval surveillance colonoscopy is recommended in October 2022.   I spent 15 minutes of face-to-face time with the patient. Greater than 50% of the time was spent counseling and coordinating care.

## 2016-09-08 ENCOUNTER — Telehealth: Payer: Self-pay

## 2016-09-08 NOTE — Telephone Encounter (Signed)
Previous referral to [facility] cancelled and referral faxed to [facility]

## 2016-09-08 NOTE — Telephone Encounter (Signed)
Error

## 2016-09-08 NOTE — Telephone Encounter (Signed)
-----   Message from Ladene Artist, MD sent at 09/07/2016  4:53 PM EST ----- Please see his recent office note. Please refer to Tillie Rung, MD at [facility] for options on removal of gastric carcinoid.

## 2016-09-14 ENCOUNTER — Encounter: Payer: Self-pay | Admitting: Internal Medicine

## 2016-09-14 ENCOUNTER — Ambulatory Visit (INDEPENDENT_AMBULATORY_CARE_PROVIDER_SITE_OTHER): Payer: Medicare Other | Admitting: Internal Medicine

## 2016-09-14 VITALS — BP 146/75 | HR 76 | Temp 97.7°F | Ht 71.0 in | Wt 223.0 lb

## 2016-09-14 DIAGNOSIS — Z23 Encounter for immunization: Secondary | ICD-10-CM | POA: Diagnosis not present

## 2016-09-14 DIAGNOSIS — B182 Chronic viral hepatitis C: Secondary | ICD-10-CM

## 2016-09-14 DIAGNOSIS — K7460 Unspecified cirrhosis of liver: Secondary | ICD-10-CM

## 2016-09-14 DIAGNOSIS — D3A098 Benign carcinoid tumors of other sites: Secondary | ICD-10-CM | POA: Diagnosis not present

## 2016-09-14 NOTE — Assessment & Plan Note (Signed)
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

## 2016-09-14 NOTE — Progress Notes (Signed)
   Subjective:    Patient ID: Dustin [name], male    DOB: [birth year], 60 y.o.   MRN: [id number]  HPI Here for follow up of HCV.    Has genotype 1a, no baseline resistance, elastography with F3/4.  Hemangioma on [place] and confirmed on MRI done by Dr. Fuller Plan.  Started on zepatier and completed over 3 months ago.  No issues and pleased with having it.  Early viral load undetectable and end of treament undetectable.  No missed doses.  No alcohol.  Had EGD with Dr. Fuller Plan and noted gastritis and gastric carcinoid and getting referral to tertiary center for evaluation of it.  No new issues.     Review of Systems  Constitutional: Negative for fatigue.  Gastrointestinal: Negative for diarrhea.  Skin: Negative for rash.  Neurological: Negative for headaches.       Objective:   Physical Exam  Constitutional: He appears well-developed and well-nourished. No distress.  Eyes: No scleral icterus.  Cardiovascular: Normal rate, regular rhythm and normal heart sounds.   Musculoskeletal: He exhibits no edema.  Skin: No rash noted.   Social History   Social History  . Marital status: Single    Spouse name: N/A  . Number of children: N/A  . Years of education: N/A   Occupational History  . Not on file.   Social History Main Topics  . Smoking status: Never Smoker  . Smokeless tobacco: Never Used  . Alcohol use No  . Drug use: No  . Sexual activity: Yes   Other Topics Concern  . Not on file   Social History Narrative  . No narrative on file         Assessment & Plan:

## 2016-09-14 NOTE — Assessment & Plan Note (Signed)
Had recent screening.  rtc 6 months for repeat ultrasound.

## 2016-09-14 NOTE — Assessment & Plan Note (Signed)
Being referred out for this for possible surgical intervention.

## 2016-09-14 NOTE — Addendum Note (Signed)
Addended by: Myrtis Hopping A on: 09/14/2016 02:10 PM   Modules accepted: Orders

## 2016-09-15 ENCOUNTER — Telehealth: Payer: Self-pay | Admitting: *Deleted

## 2016-09-15 NOTE — Telephone Encounter (Signed)
Called patient and notified him of appt for ultrasound at [place] [facility] for 02/28/16 at 7:40 AM. Nothing to eat or drink after midnight. Myrtis Hopping

## 2016-09-19 ENCOUNTER — Telehealth: Payer: Self-pay | Admitting: Gastroenterology

## 2016-09-19 NOTE — Telephone Encounter (Signed)
Complete therapy as you instructed.  Send an H pylori stool antigen test 6 weeks after he finishes the antibiotics and Nexium

## 2016-09-19 NOTE — Telephone Encounter (Signed)
Patient states he has over 10 pills left of Pylera. Informed patient that he should not have any pills left after the 14 day treatment. Confirmed with patient that he is taking 3 capsules four times a day. Patient states he has been taking it one capsule by mouth four times a day. I told patient to continue taking it 3 capsules by mouth four times a day until treatment is finished along with the Nexium he has left twice daily. Told patient I will send this to Dr. Fuller Plan to let him know and see if there needs to be a change in treatment. Patient verbalized understanding.

## 2016-09-19 NOTE — Telephone Encounter (Signed)
Informed patient to continue treatment at current dose. Also informed patient that I will contact him in 6 weeks to have a H. Pylori stool antigen test done. Patient verbalized understanding.

## 2016-09-28 ENCOUNTER — Other Ambulatory Visit: Payer: Medicare Other

## 2016-10-03 DIAGNOSIS — D3A092 Benign carcinoid tumor of the stomach: Secondary | ICD-10-CM | POA: Diagnosis not present

## 2016-10-03 DIAGNOSIS — K2970 Gastritis, unspecified, without bleeding: Secondary | ICD-10-CM | POA: Diagnosis not present

## 2016-10-03 DIAGNOSIS — D3A8 Other benign neuroendocrine tumors: Secondary | ICD-10-CM | POA: Diagnosis not present

## 2016-10-03 DIAGNOSIS — Z96653 Presence of artificial knee joint, bilateral: Secondary | ICD-10-CM | POA: Diagnosis not present

## 2016-10-03 DIAGNOSIS — K3189 Other diseases of stomach and duodenum: Secondary | ICD-10-CM | POA: Diagnosis not present

## 2016-10-03 DIAGNOSIS — R933 Abnormal findings on diagnostic imaging of other parts of digestive tract: Secondary | ICD-10-CM | POA: Diagnosis not present

## 2016-10-06 NOTE — Telephone Encounter (Signed)
Patient was seen by Dr. Mont Dutton on 10/03/16

## 2016-10-09 ENCOUNTER — Ambulatory Visit: Payer: Medicare Other | Attending: Family Medicine | Admitting: Family Medicine

## 2016-10-09 ENCOUNTER — Encounter: Payer: Self-pay | Admitting: Family Medicine

## 2016-10-09 VITALS — BP 169/78 | HR 88 | Temp 98.1°F | Ht 71.0 in | Wt 216.6 lb

## 2016-10-09 DIAGNOSIS — Z7982 Long term (current) use of aspirin: Secondary | ICD-10-CM | POA: Insufficient documentation

## 2016-10-09 DIAGNOSIS — K7460 Unspecified cirrhosis of liver: Secondary | ICD-10-CM | POA: Diagnosis not present

## 2016-10-09 DIAGNOSIS — I10 Essential (primary) hypertension: Secondary | ICD-10-CM | POA: Diagnosis not present

## 2016-10-09 DIAGNOSIS — M19042 Primary osteoarthritis, left hand: Secondary | ICD-10-CM

## 2016-10-09 MED ORDER — NAPROXEN 500 MG PO TABS: 500.0000 mg | ORAL_TABLET | Freq: Two times a day (BID) | ORAL | 0 refills | Status: DC

## 2016-10-09 MED ORDER — ACETAMINOPHEN-CODEINE #3 300-30 MG PO TABS: 1.0000 | ORAL_TABLET | Freq: Three times a day (TID) | ORAL | 2 refills | Status: DC | PRN

## 2016-10-09 MED ORDER — CARVEDILOL 6.25 MG PO TABS: ORAL_TABLET | ORAL | 2 refills | Status: DC

## 2016-10-09 MED ORDER — HYDROCHLOROTHIAZIDE 25 MG PO TABS: 25.0000 mg | ORAL_TABLET | Freq: Every day | ORAL | 3 refills | Status: DC

## 2016-10-09 NOTE — Progress Notes (Signed)
Subjective:  Patient ID: Dustin [name], male    DOB: [birth year]  Age: 60 y.o. MRN: [id number]  CC: Hypertension   HPI Dustin [name] has HTN, cirrhosis, presents for   1. CHRONIC HYPERTENSION  Disease Monitoring  Blood pressure range: not checking    Chest pain: no   Dyspnea: no   Claudication: no   Medication compliance: yes  Medication Side Effects  Lightheadedness: no   Urinary frequency: no   Edema: no   Impotence: no   Preventitive Healthcare:  Exercise: yes, gym 2-3 times per week for 45 mins    Diet Pattern: eating 3 meals a day and a lot of snacks high in salt   Salt Restriction: no   3. L hand pains: middle  finger at PIP joint. His finger gest stuck and is painful.  For past 6 months. No hand swelling. Pain in finger comes and goes. Has tried OTC topical pain relief gel with temporary improvement. pain is 5/10. Pain is exacerbated by combing hair.  He reports that tramadol does not relieve pain.   Social History  Substance Use Topics  . Smoking status: Never Smoker  . Smokeless tobacco: Never Used  . Alcohol use No    Outpatient Medications Prior to Visit  Medication Sig Dispense Refill  . amLODipine (NORVASC) 10 MG tablet Take 1 tablet (10 mg total) by mouth daily. 90 tablet 3  . aspirin EC 325 MG tablet Take 1 tablet (325 mg total) by mouth daily. 30 tablet 0  . hydrochlorothiazide (MICROZIDE) 12.5 MG capsule Take 1 capsule (12.5 mg total) by mouth daily. 90 capsule 3  . traMADol (ULTRAM) 50 MG tablet Take 1 tablet (50 mg total) by mouth every 12 (twelve) hours as needed for moderate pain. 60 tablet 2   Facility-Administered Medications Prior to Visit  Medication Dose Route Frequency Provider Last Rate Last Dose  . 0.9 %  sodium chloride infusion  500 mL Intravenous Continuous Ladene Artist, MD        ROS Review of Systems  Constitutional: Negative for chills, fatigue, fever and unexpected weight change.  Eyes: Negative for visual disturbance.    Respiratory: Negative for cough and shortness of breath.   Cardiovascular: Negative for chest pain, palpitations and leg swelling.  Gastrointestinal: Negative for abdominal pain, blood in stool, constipation, diarrhea, nausea and vomiting.  Endocrine: Negative for polydipsia, polyphagia and polyuria.  Musculoskeletal: Positive for arthralgias (L hand joint pains, fingers get stuck). Negative for back pain, gait problem, myalgias and neck pain.  Skin: Negative for rash.  Allergic/Immunologic: Negative for immunocompromised state.  Neurological: Positive for dizziness.  Hematological: Negative for adenopathy. Does not bruise/bleed easily.  Psychiatric/Behavioral: Negative for dysphoric mood, sleep disturbance and suicidal ideas. The patient is not nervous/anxious.     Objective:  BP (!) 169/78 (BP Location: Left Arm, Patient Position: Sitting, Cuff Size: Small)   Pulse 88   Temp 98.1 F (36.7 C) (Oral)   Ht 5\' 11"  (1.803 m)   Wt 216 lb 9.6 oz (98.2 kg)   SpO2 97%   BMI 30.21 kg/m   BP/Weight 10/09/2016 09/14/2016 [id number]  Systolic BP [id number]  Diastolic BP 78 75 66  Wt. (Lbs) 216.6 223 223  BMI 30.21 31.1 32.93    Physical Exam  Constitutional: He appears well-developed and well-nourished. No distress.  HENT:  Head: Normocephalic and atraumatic.  Neck: Normal range of motion. Neck supple.  Cardiovascular: Normal rate, regular rhythm, normal heart sounds  and intact distal pulses.   Pulmonary/Chest: Effort normal and breath sounds normal.  Musculoskeletal: He exhibits no edema.       Right knee: He exhibits no swelling.       Left knee: He exhibits no swelling.       Hands:      Legs: Neurological: He is alert.  Skin: Skin is warm and dry. No rash noted. No erythema.  Psychiatric: He has a normal mood and affect.    Assessment & Plan:  Kaidenn was seen today for hypertension.  Diagnoses and all orders for this visit:  Essential hypertension, benign -      hydrochlorothiazide (HYDRODIURIL) 25 MG tablet; Take 1 tablet (25 mg total) by mouth daily. -     carvedilol (COREG) 6.25 MG tablet; Take by mouth  3.125 (1.2 tablet) twice daily for two week, then 6.25 mg twice daily  Primary osteoarthritis of left hand -     naproxen (NAPROSYN) 500 MG tablet; Take 1 tablet (500 mg total) by mouth 2 (two) times daily with a meal. -     acetaminophen-codeine (TYLENOL #3) 300-30 MG tablet; Take 1 tablet by mouth every 8 (eight) hours as needed.   There are no diagnoses linked to this encounter.  No orders of the defined types were placed in this encounter.   Follow-up: Return in about 6 weeks (around 11/20/2016) for HTN .   Boykin Nearing MD

## 2016-10-09 NOTE — Assessment & Plan Note (Signed)
Uncontrolled [id number] P: Increase HCTZ to 25 mg daily Add coreg 3.125 mg BID, then 6.25 mg BID after two weeks Reduce salt in diet

## 2016-10-09 NOTE — Patient Instructions (Addendum)
Dustin [name] was seen today for hypertension.  Diagnoses and all orders for this visit:  Essential hypertension, benign -     hydrochlorothiazide (HYDRODIURIL) 25 MG tablet; Take 1 tablet (25 mg total) by mouth daily. -     carvedilol (COREG) 6.25 MG tablet; Take by mouth  3.125 (1.2 tablet) twice daily for two week, then 6.25 mg twice daily  Primary osteoarthritis of left hand -     naproxen (NAPROSYN) 500 MG tablet; Take 1 tablet (500 mg total) by mouth 2 (two) times daily with a meal. -     acetaminophen-codeine (TYLENOL #3) 300-30 MG tablet; Take 1 tablet by mouth every 8 (eight) hours as needed.  take naproxen for 3-5 days  Increase coreg to 6.25 mg BID in 2 weeks  F/u in 2 weeks for BP check with pharmacy   F/u with me in 6 weeks for trigger finger and HTN  Dr. Adrian Blackwater   Trigger Finger Trigger finger (digital tendinitis and stenosing tenosynovitis) is a common disorder that causes an often painful catching of the fingers or thumb. It occurs as a clicking, snapping, or locking of a finger in the palm of the hand. This is caused by a problem with the tendons that flex or bend the fingers sliding smoothly through their sheaths. The condition may occur in any finger or a couple fingers at the same time.  The finger may lock with the finger curled or suddenly straighten out with a snap. This is more common in patients with rheumatoid arthritis and diabetes. Left untreated, the condition may get worse to the point where the finger becomes locked in flexion, like making a fist, or less commonly locked with the finger straightened out. CAUSES   Inflammation and scarring that lead to swelling around the tendon sheath.  Repeated or forceful movements.  Rheumatoid arthritis, an autoimmune disease that affects joints.  Gout.  Diabetes mellitus. SIGNS AND SYMPTOMS  Soreness and swelling of your finger.  A painful clicking or snapping as you bend and straighten your finger. DIAGNOSIS  Your  health care provider will do a physical exam of your finger to diagnose trigger finger. TREATMENT   Splinting for 6-8 weeks may be helpful.  Nonsteroidal anti-inflammatory medicines (NSAIDs) can help to relieve the pain and inflammation.  Cortisone injections, along with splinting, may speed up recovery. Several injections may be required. Cortisone may give relief after one injection.  Surgery is another treatment that may be used if conservative treatments do not work. Surgery can be minor, without incisions (a cut does not have to be made), and can be done with a needle through the skin.  Other surgical choices involve an open procedure in which the surgeon opens the hand through a small incision and cuts the pulley so the tendon can again slide smoothly. Your hand will still work fine. HOME CARE INSTRUCTIONS  Apply ice to the injured area, twice per day:  Put ice in a plastic bag.  Place a towel between your skin and the bag.  Leave the ice on for 20 minutes, 3-4 times a day.  Rest your hand often. MAKE SURE YOU:   Understand these instructions.  Will watch your condition.  Will get help right away if you are not doing well or get worse. This information is not intended to replace advice given to you by your health care provider. Make sure you discuss any questions you have with your health care provider. Document Released: 08/05/2004 Document Revised: 06/18/2013 Document  Reviewed: 03/18/2013 Elsevier Interactive Patient Education  2017 [employer].

## 2016-10-09 NOTE — Progress Notes (Signed)
Pt is here today to follow up on HTN. Pt is also having left hand pain.

## 2016-10-09 NOTE — Assessment & Plan Note (Signed)
In knees and L hand with trigger finger of L PIP Plan: Brace for 6 weeks Ice Naproxen Tylenol #3

## 2016-10-11 ENCOUNTER — Other Ambulatory Visit: Payer: Self-pay

## 2016-10-11 ENCOUNTER — Telehealth: Payer: Self-pay

## 2016-10-11 DIAGNOSIS — D3A8 Other benign neuroendocrine tumors: Secondary | ICD-10-CM

## 2016-10-11 NOTE — Telephone Encounter (Signed)
Patient notified to come for a gastrin level.  He verbalized understanding to come at his convenience.  He is off of his PPI. He stated he will come next week

## 2016-10-11 NOTE — Telephone Encounter (Signed)
-----   Message from Marlon Pel, RN sent at 09/27/2016 11:39 AM EST ----- He messed up directions for the Pylera .  Call in 2 weeks.  ----- Message ----- From: Marlon Pel, RN Sent: 09/27/2016 To: Marlon Pel, RN  Patient should finish PPI and Pylera on 09/27/16.  Call him to come in for gastrin level ----- Message ----- From: Ladene Artist, MD Sent: 09/07/2016   5:31 PM To: Marlon Pel, RN  Please have him come in for a fasting gastrin, no sooner than 1 week after he completes his course of therapy for H pylori and is off PPIs for 1 week. 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 Must be completely fasting and off PPIs for 1 week for this to be useful.

## 2016-10-12 ENCOUNTER — Other Ambulatory Visit: Payer: Medicare Other

## 2016-10-12 DIAGNOSIS — D3A8 Other benign neuroendocrine tumors: Secondary | ICD-10-CM | POA: Diagnosis not present

## 2016-10-16 LAB — GASTRIN: Gastrin: 32 pg/mL (ref <=100)

## 2016-10-16 NOTE — Progress Notes (Unsigned)
Left message for patient to call back  

## 2016-10-31 ENCOUNTER — Encounter: Payer: Self-pay | Admitting: Pharmacist

## 2016-10-31 ENCOUNTER — Ambulatory Visit: Payer: Medicare Other | Attending: Family Medicine | Admitting: Pharmacist

## 2016-10-31 VITALS — BP 132/86 | HR 70

## 2016-10-31 DIAGNOSIS — I10 Essential (primary) hypertension: Secondary | ICD-10-CM | POA: Diagnosis not present

## 2016-10-31 DIAGNOSIS — Z79899 Other long term (current) drug therapy: Secondary | ICD-10-CM | POA: Insufficient documentation

## 2016-10-31 NOTE — Progress Notes (Signed)
   S:    Patient arrives in good spirits.  Presents to the clinic for hypertension evaluation. Patient was referred on 10/09/16 by Dr. Debbe Mounts. Patient was last seen by Primary Care Provider on 10/09/16.   Patient reports adherence with medications. He took all of his medications this morning.   Current BP Medications include:  Amlodipine 10 mg daily, carvedilol 6.25 mg BID, hydrochlorothiazide 25 mg daily.   Antihypertensives tried in the past include: valsartan-HCTZ (historical med)   O:   Last 3 Office BP readings: BP Readings from Last 3 Encounters:  10/31/16 132/86  10/09/16 (!) 169/78  09/14/16 (!) 146/75    BMET    Component Value Date/Time   NA 135 03/14/2016 1114   K 4.1 03/14/2016 1114   CL 101 03/14/2016 1114   CO2 24 03/14/2016 1114   GLUCOSE 89 03/14/2016 1114   BUN 12 03/14/2016 1114   CREATININE 1.10 07/18/2016 0913   CREATININE 1.00 03/14/2016 1114   CALCIUM 10.0 03/14/2016 1114   GFRNONAA 81 03/14/2016 1114   GFRAA >89 03/14/2016 1114    A/P: Hypertension longstanding currently controlled on current medications.  Continued current medications as prescribed. Instructed patient to get medications refills on time and to not miss any doses of his blood pressure medications.   Results reviewed and written information provided.   Total time in face-to-face counseling 15 minutes.   F/U Clinic Visit with Dr. Adrian Blackwater.

## 2016-10-31 NOTE — Patient Instructions (Addendum)
Thanks for coming to see me!  Keep taking all of your medications as prescribed  Follow up with Dr. Adrian Blackwater in 3-4 weeks  DASH Eating Plan DASH stands for "Dietary Approaches to Stop Hypertension." The DASH eating plan is a healthy eating plan that has been shown to reduce high blood pressure (hypertension). Additional health benefits may include reducing the risk of type 2 diabetes mellitus, heart disease, and stroke. The DASH eating plan may also help with weight loss. What do I need to know about the DASH eating plan? For the DASH eating plan, you will follow these general guidelines:  Choose foods with less than 150 milligrams of sodium per serving (as listed on the food label).  Use salt-free seasonings or herbs instead of table salt or sea salt.  Check with your health care provider or pharmacist before using salt substitutes.  Eat lower-sodium products. These are often labeled as "low-sodium" or "no salt added."  Eat fresh foods. Avoid eating a lot of canned foods.  Eat more vegetables, fruits, and low-fat dairy products.  Choose whole grains. Look for the word "whole" as the first word in the ingredient list.  Choose fish and skinless chicken or [place] more often than red meat. Limit fish, poultry, and meat to 6 oz (170 g) each day.  Limit sweets, desserts, sugars, and sugary drinks.  Choose heart-healthy fats.  Eat more home-cooked food and less restaurant, buffet, and fast food.  Limit fried foods.  Do not fry foods. Cook foods using methods such as baking, boiling, grilling, and broiling instead.  When eating at a restaurant, ask that your food be prepared with less salt, or no salt if possible. What foods can I eat? Seek help from a dietitian for individual calorie needs. Grains  Whole grain or whole wheat bread. Brown rice. Whole grain or whole wheat pasta. Quinoa, bulgur, and whole grain cereals. Low-sodium cereals. Corn or whole wheat flour tortillas. Whole  grain cornbread. Whole grain crackers. Low-sodium crackers. Vegetables  Fresh or frozen vegetables (raw, steamed, roasted, or grilled). Low-sodium or reduced-sodium tomato and vegetable juices. Low-sodium or reduced-sodium tomato sauce and paste. Low-sodium or reduced-sodium canned vegetables. Fruits  All fresh, canned (in natural juice), or frozen fruits. Meat and Other Protein Products  Ground beef (85% or leaner), grass-fed beef, or beef trimmed of fat. Skinless chicken or [place]. Ground chicken or [place]. Pork trimmed of fat. All fish and seafood. Eggs. Dried beans, peas, or lentils. Unsalted nuts and seeds. Unsalted canned beans. Dairy  Low-fat dairy products, such as skim or 1% milk, 2% or reduced-fat cheeses, low-fat ricotta or cottage cheese, or plain low-fat yogurt. Low-sodium or reduced-sodium cheeses. Fats and Oils  Tub margarines without trans fats. Light or reduced-fat mayonnaise and salad dressings (reduced sodium). Avocado. Safflower, olive, or canola oils. Natural peanut or almond butter. Other  Unsalted popcorn and pretzels. The items listed above may not be a complete list of recommended foods or beverages. Contact your dietitian for more options.  What foods are not recommended? Grains  White bread. White pasta. White rice. Refined cornbread. Bagels and croissants. Crackers that contain trans fat. Vegetables  Creamed or fried vegetables. Vegetables in a cheese sauce. Regular canned vegetables. Regular canned tomato sauce and paste. Regular tomato and vegetable juices. Fruits  Canned fruit in light or heavy syrup. Fruit juice. Meat and Other Protein Products  Fatty cuts of meat. Ribs, chicken wings, bacon, sausage, bologna, salami, chitterlings, fatback, hot dogs, bratwurst, and packaged luncheon meats.  Salted nuts and seeds. Canned beans with salt. Dairy  Whole or 2% milk, cream, half-and-half, and cream cheese. Whole-fat or sweetened yogurt. Full-fat cheeses or blue  cheese. Nondairy creamers and whipped toppings. Processed cheese, cheese spreads, or cheese curds. Condiments  Onion and garlic salt, seasoned salt, table salt, and sea salt. Canned and packaged gravies. Worcestershire sauce. Tartar sauce. Barbecue sauce. Teriyaki sauce. Soy sauce, including reduced sodium. Steak sauce. Fish sauce. Oyster sauce. Cocktail sauce. Horseradish. Ketchup and mustard. Meat flavorings and tenderizers. Bouillon cubes. Hot sauce. Tabasco sauce. Marinades. Taco seasonings. Relishes. Fats and Oils  Butter, stick margarine, lard, shortening, ghee, and bacon fat. Coconut, palm kernel, or palm oils. Regular salad dressings. Other  Pickles and olives. Salted popcorn and pretzels. The items listed above may not be a complete list of foods and beverages to avoid. Contact your dietitian for more information.  Where can I find more information? National Heart, Lung, and Blood Institute: travelstabloid.com This information is not intended to replace advice given to you by your health care provider. Make sure you discuss any questions you have with your health care provider. Document Released: 10/05/2011 Document Revised: 03/23/2016 Document Reviewed: 08/20/2013 Elsevier Interactive Patient Education  2017 [employer].

## 2016-11-09 ENCOUNTER — Telehealth: Payer: Self-pay

## 2016-11-09 DIAGNOSIS — A048 Other specified bacterial intestinal infections: Secondary | ICD-10-CM

## 2016-11-09 NOTE — Telephone Encounter (Signed)
Put H. Pylori stool antigen [facility] lab in call patient and have patient come in to the lab. See previous phone note from 09/19/16.   Left a message for patient to return my call.

## 2016-11-10 NOTE — Telephone Encounter (Signed)
Called patient to ask if he can come by our lab next week to have the H. Pylori stool antigen since it has been 6 weeks off ATB's. Lab order put in Epic. Patient states he will come by our lab next week.

## 2016-11-21 ENCOUNTER — Ambulatory Visit: Payer: Medicare Other | Attending: Family Medicine | Admitting: Family Medicine

## 2016-11-21 ENCOUNTER — Ambulatory Visit
Admission: RE | Admit: 2016-11-21 | Discharge: 2016-11-21 | Disposition: A | Discharge status: Home / Self Care | Payer: Medicare Other | Source: Ambulatory Visit | Attending: Family Medicine | Admitting: Family Medicine

## 2016-11-21 ENCOUNTER — Encounter: Payer: Self-pay | Admitting: Family Medicine

## 2016-11-21 VITALS — BP 130/72 | HR 64 | Temp 98.3°F | Ht 71.0 in | Wt 213.6 lb

## 2016-11-21 DIAGNOSIS — I10 Essential (primary) hypertension: Secondary | ICD-10-CM | POA: Insufficient documentation

## 2016-11-21 DIAGNOSIS — Z7982 Long term (current) use of aspirin: Secondary | ICD-10-CM | POA: Diagnosis not present

## 2016-11-21 DIAGNOSIS — M19041 Primary osteoarthritis, right hand: Secondary | ICD-10-CM | POA: Diagnosis not present

## 2016-11-21 DIAGNOSIS — M19042 Primary osteoarthritis, left hand: Principal | ICD-10-CM

## 2016-11-21 DIAGNOSIS — K7460 Unspecified cirrhosis of liver: Secondary | ICD-10-CM | POA: Diagnosis not present

## 2016-11-21 MED ORDER — DICLOFENAC SODIUM 1 % TD GEL: 4.0000 g | Freq: Four times a day (QID) | TRANSDERMAL | 5 refills | Status: DC

## 2016-11-21 MED ORDER — CARVEDILOL 6.25 MG PO TABS: 6.2500 mg | ORAL_TABLET | Freq: Two times a day (BID) | ORAL | 2 refills | Status: DC

## 2016-11-21 NOTE — Assessment & Plan Note (Signed)
Still a bit elevated Continue coreg 6.25 mg BID, norvasc 10 mg daily, HCTZ 25 mg daily Continue exercise Start reduced salt diet

## 2016-11-21 NOTE — Patient Instructions (Addendum)
Dustin [name] was seen today for hypertension and hand pain.  Diagnoses and all orders for this visit:  Primary osteoarthritis of both hands -     diclofenac sodium (VOLTAREN) 1 % GEL; Apply 4 g topically 4 (four) times daily. -     DG Hand Complete Left; Future -     DG Hand Complete Right; Future  Essential hypertension, benign -     carvedilol (COREG) 6.25 MG tablet; Take 1 tablet (6.25 mg total) by mouth 2 (two) times daily with a meal.   Conitine current regimen Decrease salt intake  F/u in 3 months for HTN  Dr. Du[facility] Address: [address], [place], [place] [address]  Phone: [phone]

## 2016-11-21 NOTE — Progress Notes (Signed)
Subjective:  Patient ID: DOCTOR COBAS, male    DOB: May 03, 1956  Age: 61 y.o. MRN: [id number]  CC: Hypertension and Hand Pain   HPI Dustin [name] has HTN, cirrhosis, presents for   1. CHRONIC HYPERTENSION  Disease Monitoring  Blood pressure range: not checking    Chest pain: no   Dyspnea: no   Claudication: no   Medication compliance: yes  Medication Side Effects  Lightheadedness: no   Urinary frequency: no   Edema: no   Impotence: no   Preventitive Healthcare:  Exercise: yes, gym 2-3 times per week for 45 mins    Diet Pattern: eating 3 meals a day and a lot of snacks high in salt   Salt Restriction: no, eating potato chips   3. Hand pains: middle  finger at PIP joint. His finger gest stuck and is painful.  For past 6 months. No hand swelling. Pain in finger comes and goes. Has tried OTC topical pain relief gel with temporary improvement. pain is 5/10. Pain is exacerbated by combing hair.  He reports that tramadol does not relieve pain. Today he reports reduced pain. He wears a splint. He now has some pain in R 2nd MCP.   Social History  Substance Use Topics  . Smoking status: Never Smoker  . Smokeless tobacco: Never Used  . Alcohol use No    Outpatient Medications Prior to Visit  Medication Sig Dispense Refill  . acetaminophen-codeine (TYLENOL #3) 300-30 MG tablet Take 1 tablet by mouth every 8 (eight) hours as needed. 60 tablet 2  . amLODipine (NORVASC) 10 MG tablet Take 1 tablet (10 mg total) by mouth daily. 90 tablet 3  . aspirin EC 325 MG tablet Take 1 tablet (325 mg total) by mouth daily. 30 tablet 0  . carvedilol (COREG) 6.25 MG tablet Take by mouth  3.125 (1.2 tablet) twice daily for two week, then 6.25 mg twice daily 60 tablet 2  . hydrochlorothiazide (HYDRODIURIL) 25 MG tablet Take 1 tablet (25 mg total) by mouth daily. 90 tablet 3  . naproxen (NAPROSYN) 500 MG tablet Take 1 tablet (500 mg total) by mouth 2 (two) times daily with a meal. 30 tablet 0    Facility-Administered Medications Prior to Visit  Medication Dose Route Frequency Provider Last Rate Last Dose  . 0.9 %  sodium chloride infusion  500 mL Intravenous Continuous Ladene Artist, MD        ROS Review of Systems  Constitutional: Negative for chills, fatigue, fever and unexpected weight change.  Eyes: Negative for visual disturbance.  Respiratory: Negative for cough and shortness of breath.   Cardiovascular: Negative for chest pain, palpitations and leg swelling.  Gastrointestinal: Negative for abdominal pain, blood in stool, constipation, diarrhea, nausea and vomiting.  Endocrine: Negative for polydipsia, polyphagia and polyuria.  Musculoskeletal: Positive for arthralgias (L hand joint pains, fingers get stuck). Negative for back pain, gait problem, myalgias and neck pain.  Skin: Negative for rash.  Allergic/Immunologic: Negative for immunocompromised state.  Neurological: Positive for dizziness.  Hematological: Negative for adenopathy. Does not bruise/bleed easily.  Psychiatric/Behavioral: Negative for dysphoric mood, sleep disturbance and suicidal ideas. The patient is not nervous/anxious.     Objective:  BP 130/72 (BP Location: Left Arm, Patient Position: Sitting, Cuff Size: Small)   Pulse 64   Temp 98.3 F (36.8 C) (Oral)   Ht 5\' 11"  (1.803 m)   Wt 213 lb 9.6 oz (96.9 kg)   SpO2 99%   BMI 29.79  kg/m   BP/Weight 11/21/2016 10/31/2016 [id number]  Systolic BP [id number]  Diastolic BP 72 86 78  Wt. (Lbs) 213.6 - 216.6  BMI 29.79 - 30.21    Physical Exam  Constitutional: He appears well-developed and well-nourished. No distress.  HENT:  Head: Normocephalic and atraumatic.  Neck: Normal range of motion. Neck supple.  Cardiovascular: Normal rate, regular rhythm, normal heart sounds and intact distal pulses.   Pulmonary/Chest: Effort normal and breath sounds normal.  Musculoskeletal: He exhibits no edema.       Right knee: He exhibits no swelling.        Left knee: He exhibits no swelling.       Hands:      Legs: Neurological: He is alert.  Skin: Skin is warm and dry. No rash noted. No erythema.  Psychiatric: He has a normal mood and affect.    Assessment & Plan:  Dustin [name] was seen today for hypertension and hand pain.  Diagnoses and all orders for this visit:  Primary osteoarthritis of both hands -     diclofenac sodium (VOLTAREN) 1 % GEL; Apply 4 g topically 4 (four) times daily. -     DG Hand Complete Left; Future -     DG Hand Complete Right; Future  Essential hypertension, benign -     carvedilol (COREG) 6.25 MG tablet; Take 1 tablet (6.25 mg total) by mouth 2 (two) times daily with a meal.   There are no diagnoses linked to this encounter.  No orders of the defined types were placed in this encounter.   Follow-up: Return in about 3 months (around 02/19/2017) for HTN and hand pains .   Boykin Nearing MD

## 2016-11-21 NOTE — Assessment & Plan Note (Signed)
OA in hands Topical NSAID Topical preferred due to HTN, chronic nature of pain  X-ray of hands

## 2016-11-24 ENCOUNTER — Other Ambulatory Visit: Payer: Self-pay | Admitting: Family Medicine

## 2016-11-24 DIAGNOSIS — M19041 Primary osteoarthritis, right hand: Secondary | ICD-10-CM

## 2016-11-24 DIAGNOSIS — M19042 Primary osteoarthritis, left hand: Principal | ICD-10-CM

## 2017-02-27 ENCOUNTER — Ambulatory Visit
Admission: RE | Admit: 2017-02-27 | Discharge: 2017-02-27 | Disposition: A | Discharge status: Home / Self Care | Payer: Medicare Other | Source: Ambulatory Visit | Attending: Internal Medicine | Admitting: Internal Medicine

## 2017-02-27 DIAGNOSIS — K7460 Unspecified cirrhosis of liver: Secondary | ICD-10-CM

## 2017-03-01 ENCOUNTER — Encounter: Payer: Self-pay | Admitting: Family Medicine

## 2017-03-01 ENCOUNTER — Ambulatory Visit: Payer: Medicare Other | Attending: Family Medicine | Admitting: Family Medicine

## 2017-03-01 VITALS — BP 126/74 | HR 64 | Temp 98.0°F | Resp 16 | Wt 217.0 lb

## 2017-03-01 DIAGNOSIS — M19042 Primary osteoarthritis, left hand: Secondary | ICD-10-CM

## 2017-03-01 DIAGNOSIS — Z79899 Other long term (current) drug therapy: Secondary | ICD-10-CM | POA: Diagnosis not present

## 2017-03-01 DIAGNOSIS — Z7982 Long term (current) use of aspirin: Secondary | ICD-10-CM | POA: Insufficient documentation

## 2017-03-01 DIAGNOSIS — E785 Hyperlipidemia, unspecified: Secondary | ICD-10-CM

## 2017-03-01 DIAGNOSIS — K7460 Unspecified cirrhosis of liver: Secondary | ICD-10-CM | POA: Diagnosis not present

## 2017-03-01 DIAGNOSIS — M19041 Primary osteoarthritis, right hand: Secondary | ICD-10-CM | POA: Diagnosis not present

## 2017-03-01 DIAGNOSIS — I10 Essential (primary) hypertension: Secondary | ICD-10-CM | POA: Diagnosis not present

## 2017-03-01 MED ORDER — HYDROCHLOROTHIAZIDE 25 MG PO TABS: 25.0000 mg | ORAL_TABLET | Freq: Every day | ORAL | 3 refills | Status: DC

## 2017-03-01 MED ORDER — DICLOFENAC SODIUM 1 % TD GEL: 4.0000 g | Freq: Four times a day (QID) | TRANSDERMAL | 5 refills | Status: DC

## 2017-03-01 MED ORDER — AMLODIPINE BESYLATE 10 MG PO TABS: 10.0000 mg | ORAL_TABLET | Freq: Every day | ORAL | 3 refills | Status: DC

## 2017-03-01 MED ORDER — CARVEDILOL 6.25 MG PO TABS: 6.2500 mg | ORAL_TABLET | Freq: Two times a day (BID) | ORAL | 3 refills | Status: DC

## 2017-03-01 NOTE — Progress Notes (Signed)
 Subjective:  Patient ID: Dustin [name], male    DOB: 07/10/1956  Age: 61 y.o. MRN: [id number]  CC: Hypertension   HPI Dustin [name] has HTN, cirrhosis, presents for   1. CHRONIC HYPERTENSION  Disease Monitoring  Blood pressure range: not checking    Chest pain: no   Dyspnea: no   Claudication: no   Medication compliance: yes  Medication Side Effects  Lightheadedness: no   Urinary frequency: no   Edema: no   Impotence: no   Preventitive Healthcare:  Exercise: yes, gym 2-3 times per week for 45 mins    Diet Pattern: eating 3 meals a day and a lot of snacks high in salt   Salt Restriction: no, eating potato chips   2. Hand OA: pain in L middle finger at PIP joint. His finger gest stuck and is painful.  For past 11 months. No hand swelling. Pain in finger comes and goes. Pain is 5/10. Pain is exacerbated by combing hait. He now has some pain in R 2nd MCP.  He has known OA in hands confirmed on x-ray. He has not seen ortho, but is interested in seeing them. Bracing and voltaren gel help.   Social History  Substance Use Topics  . Smoking status: Never Smoker  . Smokeless tobacco: Never Used  . Alcohol use No    Outpatient Medications Prior to Visit  Medication Sig Dispense Refill  . acetaminophen-codeine (TYLENOL #3) 300-30 MG tablet Take 1 tablet by mouth every 8 (eight) hours as needed. 60 tablet 2  . amLODipine (NORVASC) 10 MG tablet Take 1 tablet (10 mg total) by mouth daily. 90 tablet 3  . aspirin EC 325 MG tablet Take 1 tablet (325 mg total) by mouth daily. 30 tablet 0  . carvedilol (COREG) 6.25 MG tablet Take 1 tablet (6.25 mg total) by mouth 2 (two) times daily with a meal. 60 tablet 2  . diclofenac sodium (VOLTAREN) 1 % GEL Apply 4 g topically 4 (four) times daily. 100 g 5  . hydrochlorothiazide (HYDRODIURIL) 25 MG tablet Take 1 tablet (25 mg total) by mouth daily. 90 tablet 3   Facility-Administered Medications Prior to Visit  Medication Dose Route Frequency  Provider Last Rate Last Dose  . 0.9 %  sodium chloride infusion  500 mL Intravenous Continuous Malcolm T Stark, MD        ROS Review of Systems  Constitutional: Negative for chills, fatigue, fever and unexpected weight change.  Eyes: Negative for visual disturbance.  Respiratory: Negative for cough and shortness of breath.   Cardiovascular: Negative for chest pain, palpitations and leg swelling.  Gastrointestinal: Negative for abdominal pain, blood in stool, constipation, diarrhea, nausea and vomiting.  Endocrine: Negative for polydipsia, polyphagia and polyuria.  Musculoskeletal: Positive for arthralgias (L hand joint pains, fingers get stuck). Negative for back pain, gait problem, myalgias and neck pain.  Skin: Negative for rash.  Allergic/Immunologic: Negative for immunocompromised state.  Neurological: Positive for dizziness.  Hematological: Negative for adenopathy. Does not bruise/bleed easily.  Psychiatric/Behavioral: Negative for dysphoric mood, sleep disturbance and suicidal ideas. The patient is not nervous/anxious.     Objective:  BP 126/74   Pulse 64   Temp 98 F (36.7 C) (Oral)   Resp 16   Wt 217 lb (98.4 kg)   SpO2 95%   BMI 30.27 kg/m   BP/Weight 03/01/2017 11/21/2016 10/31/2016  Systolic BP 126 130 132  Diastolic BP 74 72 86  Wt. (Lbs) 217 213.6 -    BMI 30.27 29.79 -   Pulse Readings from Last 3 Encounters:  03/01/17 64  11/21/16 64  10/31/16 70    Physical Exam  Constitutional: He appears well-developed and well-nourished. No distress.  HENT:  Head: Normocephalic and atraumatic.  Neck: Normal range of motion. Neck supple.  Cardiovascular: Normal rate, regular rhythm, normal heart sounds and intact distal pulses.   Pulmonary/Chest: Effort normal and breath sounds normal.  Musculoskeletal: He exhibits no edema.       Right knee: He exhibits no swelling.       Left knee: He exhibits no swelling.       Hands:      Legs: Neurological: He is alert.  Skin:  Skin is warm and dry. No rash noted. No erythema.  Psychiatric: He has a normal mood and affect.    Assessment & Plan:  Dustin [name] was seen today for hypertension.  Diagnoses and all orders for this visit:  Essential hypertension, benign -     Lipid Panel -     CMP14+EGFR -     CBC -     amLODipine (NORVASC) 10 MG tablet; Take 1 tablet (10 mg total) by mouth daily. -     carvedilol (COREG) 6.25 MG tablet; Take 1 tablet (6.25 mg total) by mouth 2 (two) times daily with a meal. -     hydrochlorothiazide (HYDRODIURIL) 25 MG tablet; Take 1 tablet (25 mg total) by mouth daily.  Primary osteoarthritis of both hands -     Ambulatory referral to Orthopedic Surgery -     diclofenac sodium (VOLTAREN) 1 % GEL; Apply 4 g topically 4 (four) times daily.   There are no diagnoses linked to this encounter.  No orders of the defined types were placed in this encounter.   Follow-up: Return in about 6 months (around 09/01/2017) for HTN.   Josalyn Funches MD   

## 2017-03-01 NOTE — Assessment & Plan Note (Signed)
Well controlled BP continue current regimen Lipids and CMP

## 2017-03-01 NOTE — Patient Instructions (Addendum)
Dustin [name] was seen today for hypertension.  Diagnoses and all orders for this visit:  Essential hypertension, benign -     Lipid Panel -     CMP14+EGFR -     CBC -     amLODipine (NORVASC) 10 MG tablet; Take 1 tablet (10 mg total) by mouth daily. -     carvedilol (COREG) 6.25 MG tablet; Take 1 tablet (6.25 mg total) by mouth 2 (two) times daily with a meal. -     hydrochlorothiazide (HYDRODIURIL) 25 MG tablet; Take 1 tablet (25 mg total) by mouth daily.  Primary osteoarthritis of both hands -     Ambulatory referral to Orthopedic Surgery -     diclofenac sodium (VOLTAREN) 1 % GEL; Apply 4 g topically 4 (four) times daily.   f/u in 6 months for HTN  Dr. Adrian Blackwater

## 2017-03-01 NOTE — Assessment & Plan Note (Signed)
A: OA in hands P: Continue voltaren gel Continue brace prn Ortho referral for possible intraarticular injections

## 2017-03-02 ENCOUNTER — Telehealth: Payer: Self-pay

## 2017-03-02 DIAGNOSIS — E785 Hyperlipidemia, unspecified: Secondary | ICD-10-CM | POA: Insufficient documentation

## 2017-03-02 LAB — CMP14+EGFR
ALBUMIN: 4.6 g/dL (ref 3.6–4.8)
ALK PHOS: 55 IU/L (ref 39–117)
ALT: 18 IU/L (ref 0–44)
AST: 22 IU/L (ref 0–40)
Albumin/Globulin Ratio: 1.4 (ref 1.2–2.2)
BILIRUBIN TOTAL: 1.2 mg/dL (ref 0.0–1.2)
BUN/Creatinine Ratio: 15 (ref 10–24)
BUN: 17 mg/dL (ref 8–27)
CHLORIDE: 97 mmol/L (ref 96–106)
CO2: 24 mmol/L (ref 18–29)
CREATININE: 1.15 mg/dL (ref 0.76–1.27)
Calcium: 10.1 mg/dL (ref 8.6–10.2)
GFR calc non Af Amer: 68 mL/min/{1.73_m2} (ref >59)
GFR, EST AFRICAN AMERICAN: 79 mL/min/{1.73_m2} (ref >59)
GLUCOSE: 100 mg/dL — AB (ref 65–99)
Globulin, Total: 3.2 g/dL (ref 1.5–4.5)
Potassium: 3.8 mmol/L (ref 3.5–5.2)
Sodium: 136 mmol/L (ref 134–144)
TOTAL PROTEIN: 7.8 g/dL (ref 6.0–8.5)

## 2017-03-02 LAB — LIPID PANEL
Chol/HDL Ratio: 3.5 ratio (ref 0.0–5.0)
Cholesterol, Total: 194 mg/dL (ref 100–199)
HDL: 56 mg/dL (ref >39)
LDL Calculated: 125 mg/dL — ABNORMAL HIGH (ref 0–99)
TRIGLYCERIDES: 64 mg/dL (ref 0–149)
VLDL CHOLESTEROL CAL: 13 mg/dL (ref 5–40)

## 2017-03-02 LAB — CBC
HEMATOCRIT: 42.3 % (ref 37.5–51.0)
HEMOGLOBIN: 15 g/dL (ref 13.0–17.7)
MCH: 30.2 pg (ref 26.6–33.0)
MCHC: 35.5 g/dL (ref 31.5–35.7)
MCV: 85 fL (ref 79–97)
Platelets: 229 10*3/uL (ref 150–379)
RBC: 4.97 x10E6/uL (ref 4.14–5.80)
RDW: 14.2 % (ref 12.3–15.4)
WBC: 4.1 10*3/uL (ref 3.4–10.8)

## 2017-03-02 MED ORDER — ATORVASTATIN CALCIUM 20 MG PO TABS: 20.0000 mg | ORAL_TABLET | Freq: Every day | ORAL | 3 refills | Status: DC

## 2017-03-02 NOTE — Addendum Note (Signed)
Addended by: Boykin Nearing on: 03/02/2017 01:14 PM   Modules accepted: Orders

## 2017-03-02 NOTE — Telephone Encounter (Signed)
Pt was called and informed of lab results, and medication being sent to pharmacy.

## 2017-03-02 NOTE — Assessment & Plan Note (Signed)
10 year CVD risk is 14.5 % Start moderate to high intensity statin Ordered lipitor 20 mg daily

## 2017-03-14 ENCOUNTER — Encounter: Payer: Self-pay | Admitting: Family Medicine

## 2017-03-15 ENCOUNTER — Encounter: Payer: Self-pay | Admitting: Internal Medicine

## 2017-03-15 ENCOUNTER — Ambulatory Visit (INDEPENDENT_AMBULATORY_CARE_PROVIDER_SITE_OTHER): Payer: Medicare Other | Admitting: Internal Medicine

## 2017-03-15 VITALS — BP 126/79 | HR 58 | Temp 97.4°F | Ht 71.0 in | Wt 217.0 lb

## 2017-03-15 DIAGNOSIS — D3A098 Benign carcinoid tumors of other sites: Secondary | ICD-10-CM | POA: Diagnosis not present

## 2017-03-15 DIAGNOSIS — K7460 Unspecified cirrhosis of liver: Secondary | ICD-10-CM | POA: Diagnosis not present

## 2017-03-15 DIAGNOSIS — B182 Chronic viral hepatitis C: Secondary | ICD-10-CM | POA: Diagnosis not present

## 2017-03-15 LAB — COMPLETE METABOLIC PANEL WITH GFR
ALT: 18 U/L (ref 9–46)
AST: 22 U/L (ref 10–35)
Albumin: 4.6 g/dL (ref 3.6–5.1)
Alkaline Phosphatase: 49 U/L (ref 40–115)
BILIRUBIN TOTAL: 0.8 mg/dL (ref 0.2–1.2)
BUN: 16 mg/dL (ref 7–25)
CHLORIDE: 101 mmol/L (ref 98–110)
CO2: 21 mmol/L (ref 20–31)
Calcium: 9.7 mg/dL (ref 8.6–10.3)
Creat: 0.98 mg/dL (ref 0.70–1.25)
GFR, EST NON AFRICAN AMERICAN: 83 mL/min (ref >=60)
Glucose, Bld: 101 mg/dL — ABNORMAL HIGH (ref 65–99)
POTASSIUM: 3.6 mmol/L (ref 3.5–5.3)
Sodium: 137 mmol/L (ref 135–146)
TOTAL PROTEIN: 7.7 g/dL (ref 6.1–8.1)

## 2017-03-15 NOTE — Assessment & Plan Note (Signed)
Went to [facility] and no further intervention indicated per the patient.

## 2017-03-15 NOTE — Assessment & Plan Note (Signed)
SVR 24 today.  Will be considered cured if negative.  Antibody will remain positive.

## 2017-03-15 NOTE — Progress Notes (Signed)
   Subjective:    Patient ID: Dustin [name], male    DOB: 09/17/1956, 61 y.o.   MRN: [id number]  HPI Here for follow HCV. 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  SVR 12 was negative and here for SVR 24.  No new issues.  No associated jaundice.  No complaints. Recent screening ultrasound without mass.  No new medications.     Review of Systems  Constitutional: Negative for fatigue.  Gastrointestinal: Negative for diarrhea.  Skin: Negative for rash.       Objective:   Physical Exam  Constitutional: He appears well-developed and well-nourished. No distress.  HENT:  Mouth/Throat: No oropharyngeal exudate.  Eyes: No scleral icterus.  Cardiovascular: Normal rate, regular rhythm and normal heart sounds.   Pulmonary/Chest: Effort normal and breath sounds normal. No respiratory distress.  Lymphadenopathy:    He has no cervical adenopathy.  Skin: No rash noted.   SH: occasional alcohol       Assessment & Plan:

## 2017-03-15 NOTE — Assessment & Plan Note (Addendum)
Ashley screen negative.  Will repeat every 6 months.  rtc 1 year  Discussed avoiding alcohol

## 2017-03-17 LAB — HEPATITIS C RNA QUANTITATIVE
HCV QUANT LOG: NOT DETECTED {Log_IU}/mL
HCV QUANT: NOT DETECTED [IU]/mL

## 2017-03-29 ENCOUNTER — Ambulatory Visit
Admission: RE | Admit: 2017-03-29 | Discharge: 2017-03-29 | Disposition: A | Discharge status: Home / Self Care | Payer: Medicaid Other | Source: Ambulatory Visit | Attending: Internal Medicine | Admitting: Internal Medicine

## 2017-03-29 DIAGNOSIS — Z8619 Personal history of other infectious and parasitic diseases: Secondary | ICD-10-CM | POA: Diagnosis not present

## 2017-03-29 DIAGNOSIS — K7460 Unspecified cirrhosis of liver: Secondary | ICD-10-CM

## 2017-04-04 ENCOUNTER — Ambulatory Visit (INDEPENDENT_AMBULATORY_CARE_PROVIDER_SITE_OTHER): Payer: Medicare Other | Admitting: Orthopaedic Surgery

## 2017-04-04 ENCOUNTER — Encounter (INDEPENDENT_AMBULATORY_CARE_PROVIDER_SITE_OTHER): Payer: Self-pay | Admitting: Orthopaedic Surgery

## 2017-04-04 VITALS — BP 131/75 | HR 61 | Ht 72.0 in | Wt 215.0 lb

## 2017-04-04 DIAGNOSIS — M65332 Trigger finger, left middle finger: Secondary | ICD-10-CM

## 2017-04-04 DIAGNOSIS — M65321 Trigger finger, right index finger: Secondary | ICD-10-CM

## 2017-04-04 MED ORDER — BUPIVACAINE HCL 0.25 % IJ SOLN
0.3300 mL | INTRAMUSCULAR | Status: AC | PRN
  Administered 2017-04-04: .33 mL

## 2017-04-04 MED ORDER — METHYLPREDNISOLONE ACETATE 40 MG/ML IJ SUSP
13.3300 mg | INTRAMUSCULAR | Status: AC | PRN
  Administered 2017-04-04: 13.33 mg

## 2017-04-04 MED ORDER — LIDOCAINE HCL 1 % IJ SOLN
0.3000 mL | INTRAMUSCULAR | Status: AC | PRN
  Administered 2017-04-04: .3 mL

## 2017-04-04 NOTE — Progress Notes (Signed)
Office Visit Note/orthopedic consultation will requesting physician Dr. Nikki Dom   Patient: Dustin [name]           Date of Birth: 06/10/1956           MRN: [id number] Visit Date: 04/04/2017              Requested by: Boykin Nearing, MD [address] [place], [place] [address] PCP: Boykin Nearing, MD   Assessment & Plan: Visit Diagnoses:  1. Trigger finger, left middle finger   2. Trigger finger, right index finger     Plan: Injection performed for both above  Finger problems. If he has persistent problems the one he'll let us know we cannot discuss outpatient trigger finger release under some local anesthesia and IV sedation.  Follow-Up Instructions: No Follow-up on file.   Orders:  No orders of the defined types were placed in this encounter.  No orders of the defined types were placed in this encounter.     Procedures: Hand/UE Inj Date/Time: 04/04/2017 9:38 AM Performed by: Marybelle Killings Authorized by: Rodell Perna C   Condition: trigger finger   Location:  Long finger Site:  L long A1 Needle Size:  25 G Approach:  Volar Ultrasound Guidance: No   Medications:  0.3 mL lidocaine 1 %; 0.33 mL bupivacaine 0.25 %; 13.33 mg methylPREDNISolone acetate 40 MG/ML Hand/UE Inj Date/Time: 04/04/2017 9:39 AM Performed by: Marybelle Killings Authorized by: Rodell Perna C   Condition: trigger finger   Location:  Index finger Site:  R index A1 Medications:  0.33 mL bupivacaine 0.25 %; 13.33 mg methylPREDNISolone acetate 40 MG/ML     Clinical Data: No additional findings.   Subjective: Chief Complaint  Patient presents with  . Left Middle Finger - Pain  . Right Index Finger - Pain    HPI 61 year old male with persistent problems with left middle finger triggering gets locked at night he has to reduce it multiple times been going on for several months she's been wearing a splint for last 2 months states whenever he leaves it off if he flexes his hand he cannot  extend his long finger. Opposite right index finger starting to catch intermittently. He's used some Voltaren gel anti-inflammatories over-the-counter without relief. No history of previous hand trauma. Negative for rheumatoid arthritis.  Review of Systems 14 point review of systems positive for history of hepatic cirrhosis, hypertension, right total knee arthroplasty. Hyperlipidemia. Hepatitis C.   Objective: Vital Signs: BP 131/75   Pulse 61   Ht 6' (1.829 m)   Wt 215 lb (97.5 kg)   BMI 29.16 kg/m   Physical Exam  Constitutional: He is oriented to person, place, and time. He appears well-developed and well-nourished.  HENT:  Head: Normocephalic and atraumatic.  Eyes: EOM are normal. Pupils are equal, round, and reactive to light.  Neck: No tracheal deviation present. No thyromegaly present.  Cardiovascular: Normal rate.   Pulmonary/Chest: Effort normal. He has no wheezes.  Abdominal: Soft. Bowel sounds are normal.  Musculoskeletal:  Healed the total knee arthroplasty incision. Vision demonstrates triggering of the left middle finger. Screw is tenderness over the right index A1 pulley with the mild catching but no true triggering at this time. Sensation fingertip is intact. No extension weakness. Collateral ligaments are stable. No thenar or hyperthenar atrophy pulses are normal good cervical range of motion no subclavicular lymphadenopathy. Normal heel-to-toe gait.  Neurological: He is alert and oriented to person, place, and time.  Skin:  Skin is warm and dry. Capillary refill takes less than 2 seconds.  Psychiatric: He has a normal mood and affect. His behavior is normal. Judgment and thought content normal.    Ortho Exam  Specialty Comments:  No specialty comments available.  Imaging: No results found.   PMFS History: Patient Active Problem List   Diagnosis Date Noted  . Hyperlipidemia LDL goal <100 03/02/2017  . 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  Hepatic cirrhosis ([facility]) 04/27/2016  . Hep C w/o coma, chronic ([facility]) 11/22/2015  . Pain due to total right knee replacement ([facility]) 05/15/2015  . History of DVT (deep vein thrombosis) 05/09/2015  . 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 Left ventricular hypertrophy by electrocardiogram 04/07/2015  . Essential hypertension, benign 04/01/2014  . Osteoarthritis 04/01/2014   Past Medical History:  Diagnosis Date  . Arthritis    bil knees, left hand  . Carcinoid tumor   . DVT of upper extremity (deep vein thrombosis) (HCC)    left brachial vein, 12/2010  . Helicobacter pylori gastritis   . History of blood clots    to left arm  . Hypertension   . Shortness of breath dyspnea    with exertion  . Tubular adenoma of colon 2017    Family History  Problem Relation Age of Onset  . Cancer Mother   . Cancer Father        prostate cancer  . Diabetes Other   . Hypertension Other   . Diabetes Maternal Uncle   . Anesthesia problems Neg Hx   . Hypotension Neg Hx   . Malignant hyperthermia Neg Hx   . Pseudochol deficiency Neg Hx     Past Surgical History:  Procedure Laterality Date  . colonscopy      1 year ago   . KNEE ARTHROPLASTY Right 04/30/2015   Procedure: COMPUTER ASSISTED TOTAL KNEE ARTHROPLASTY;  Surgeon: Marybelle Killings, MD;  Location: [facility];  Service: Orthopedics;  Laterality: Right;  . Left  Hand   2011   cyst removed.  Marland Kitchen TOTAL KNEE ARTHROPLASTY  10/19/2011   Procedure: TOTAL KNEE ARTHROPLASTY;  Surgeon: Sharmon Revere;  Location: [facility];  Service: Orthopedics;  Laterality: Left;   Social History   Occupational History  . Not on file.   Social History Main Topics  . Smoking status: Never Smoker  . Smokeless tobacco: Never Used  . Alcohol use No  . Drug use: No  . Sexual activity: Yes

## 2017-08-03 IMAGING — MR MR ABDOMEN WO/W CM
10 of 18 series · 22 of 48 positions shown · IV contrast (multihance)
Comparison: Right upper quadrant ultrasound dated [DATE] 06/29/2016

CLINICAL DATA: Liver lesion on ultrasound, hepatitis C

EXAM:
MRI ABDOMEN WITHOUT AND WITH CONTRAST
TECHNIQUE: Multiplanar multisequence MR imaging of the abdomen was performed
both before and after the administration of intravenous contrast.
CONTRAST:  20mL MULTIHANCE GADOBENATE DIMEGLUMINE 529 MG/ML IV SOLN

[Series 3: T2 fat-sat · axial · 5.0mm · 0.86mm/px · 1 of 58 slices shown]
[im 1/58]
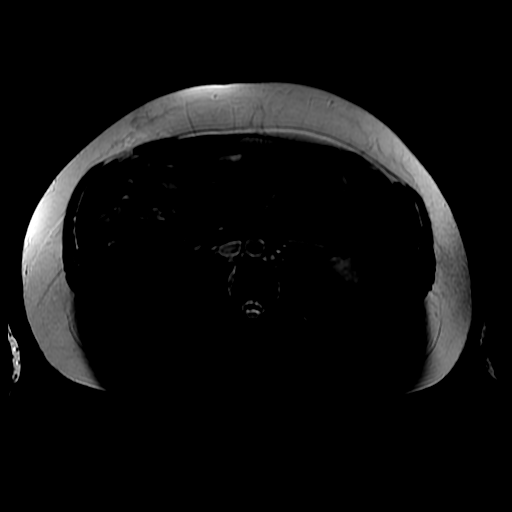

[Series 4: DWI b500 · axial · 6.0mm · 1.72mm/px · z∈[-57,+231]mm · 3 of 76 slices shown]
[im 1/76]
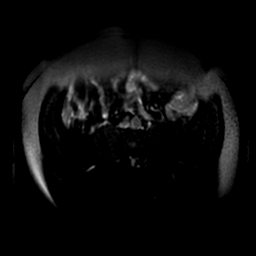
[im 38/76]
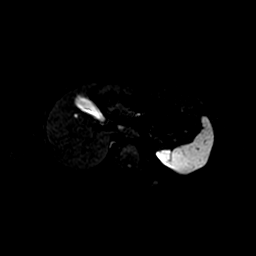
[im 76/76]
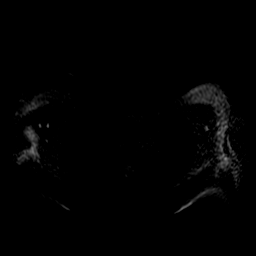

[Series 5: ax dualecho · axial · 5.0mm · 0.86mm/px · z∈[-39,+246]mm · 4 of 116 slices shown]
[im 1/116]
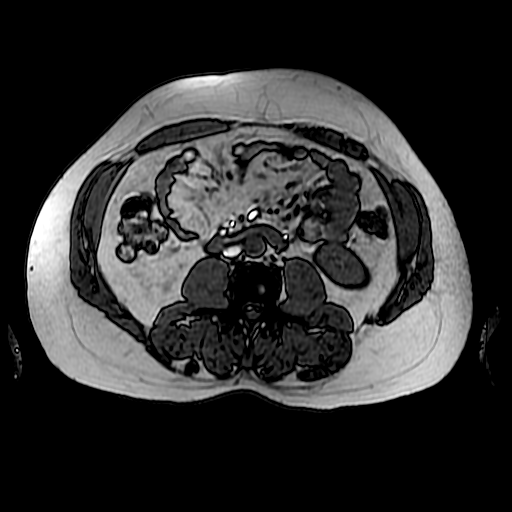
[im 39/116]
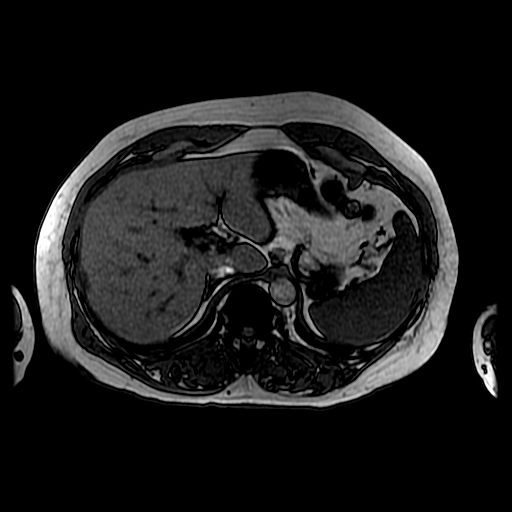
[im 77/116]
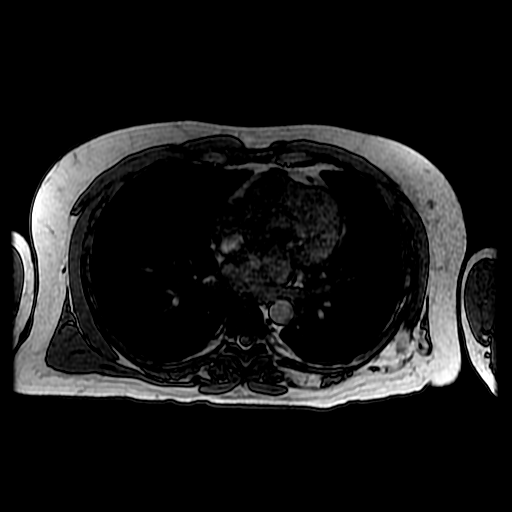
[im 116/116]
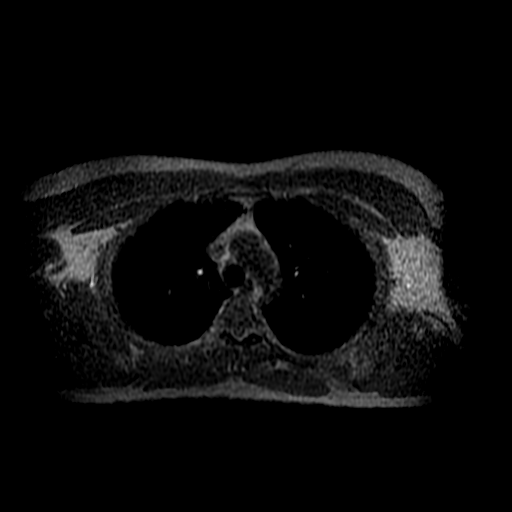

[Series 6: T2 · axial · 5.0mm · 0.86mm/px · z∈[-39,+246]mm · 2 of 58 slices shown (1 of 2)]
[im 1/58]
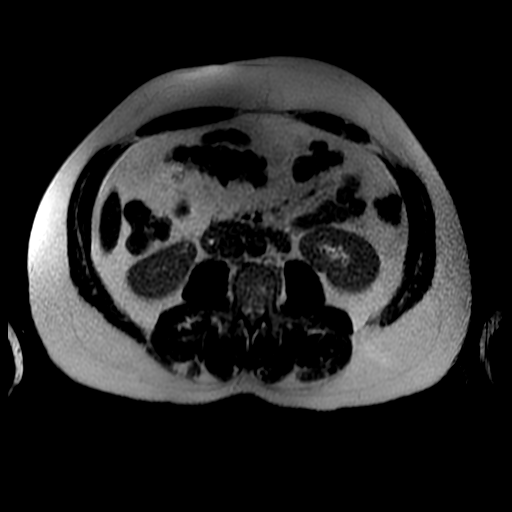
[im 58/58]
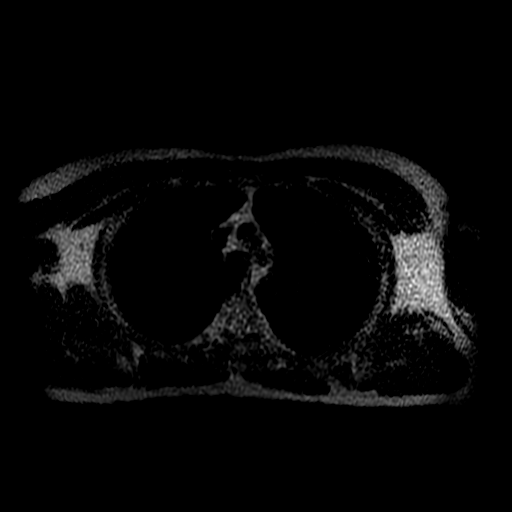

[Series 9: T2 · coronal · 5.0mm · 0.78mm/px · 2 of 44 slices shown (2 of 2)]
[im 1/44]
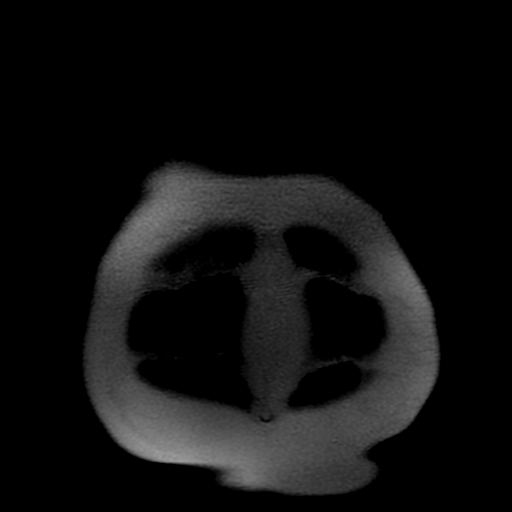
[im 44/44]
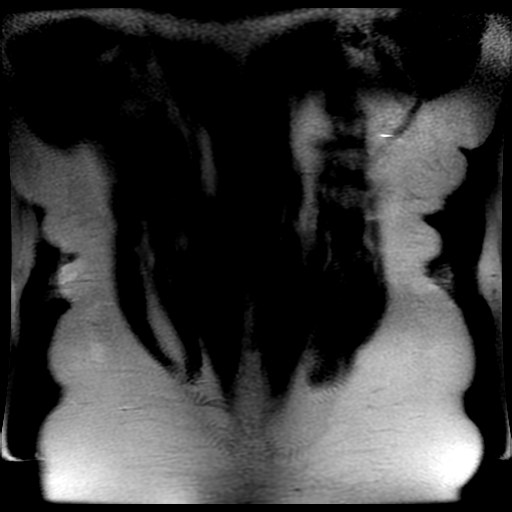

[Series 10: bSSFP · axial · 5.0mm · 0.86mm/px · z∈[-39,+246]mm · 2 of 58 slices shown]
[im 1/58]
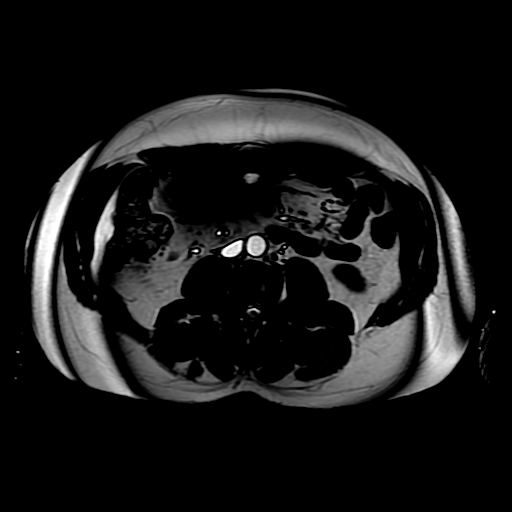
[im 58/58]
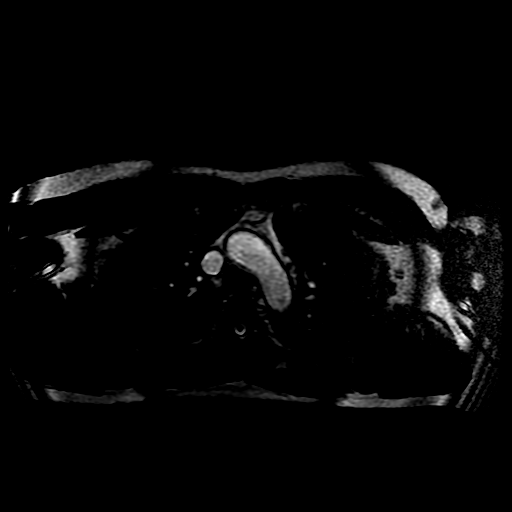

[Series 400: DWI · axial · 6.0mm · 1.72mm/px · 1 of 38 slices shown]
[im 1/38]
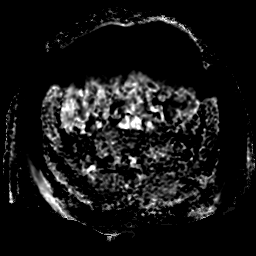

[Series 1300: T1 dynamic · axial · 5.8mm · 0.78mm/px · z∈[-64,+188]mm · 3 of 88 slices shown (1 of 3)]
[im 1/88]
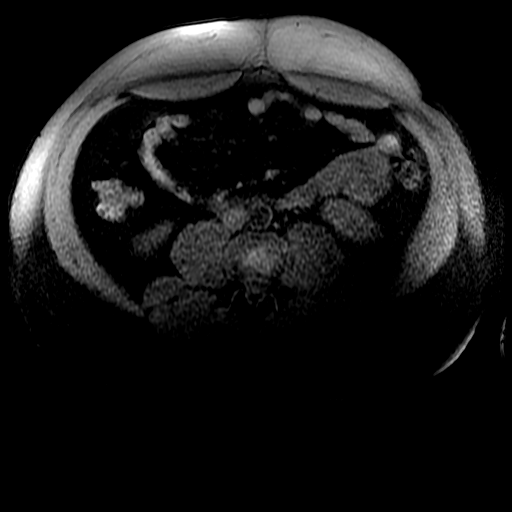
[im 44/88]
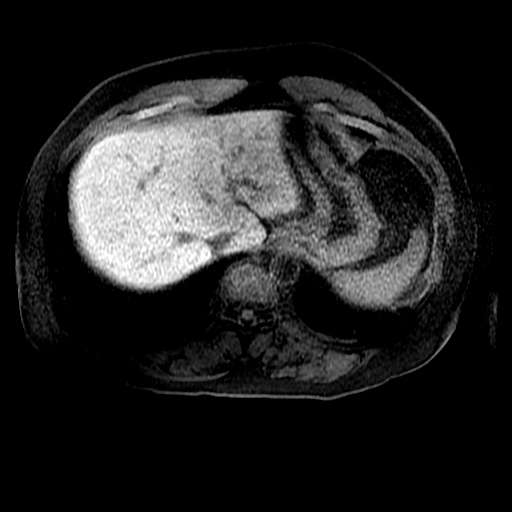
[im 88/88]
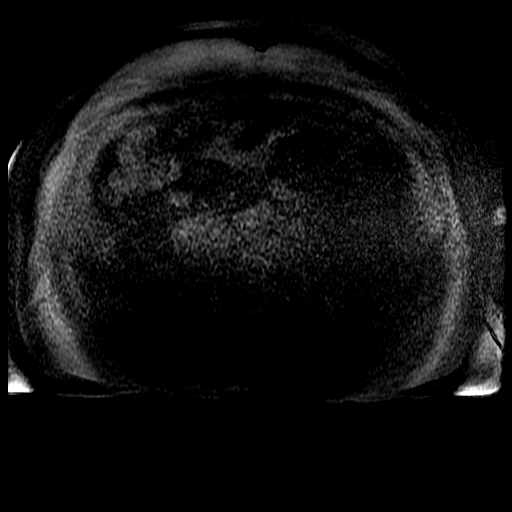

[Series 1301: T1 dynamic · axial · 5.8mm · 0.78mm/px · z∈[-64,+188]mm · 3 of 88 slices shown (2 of 3)]
[im 1/88]
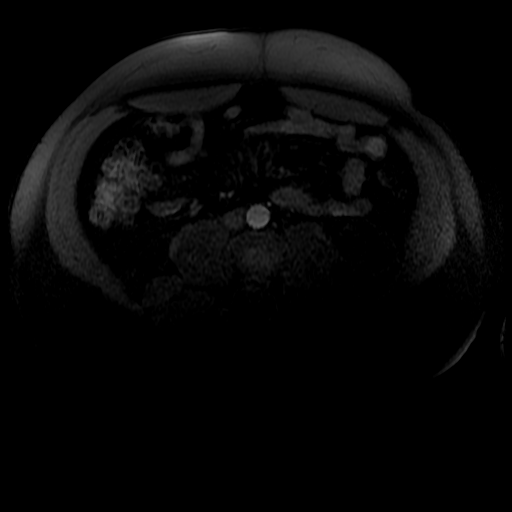
[im 44/88]
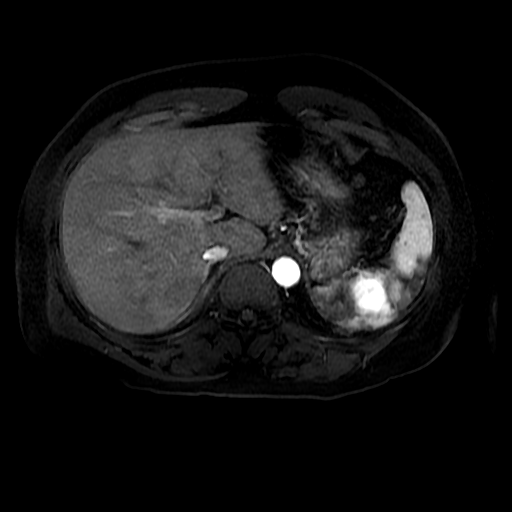
[im 88/88]
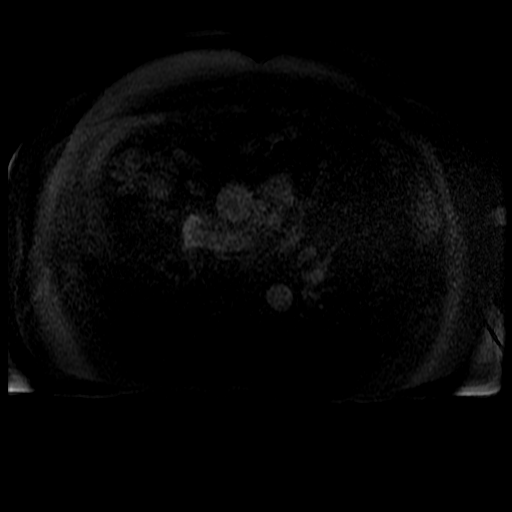

[Series 1302: T1 dynamic · axial · 5.8mm · 0.78mm/px · 1 of 88 slices shown (3 of 3)]
[im 1/88]
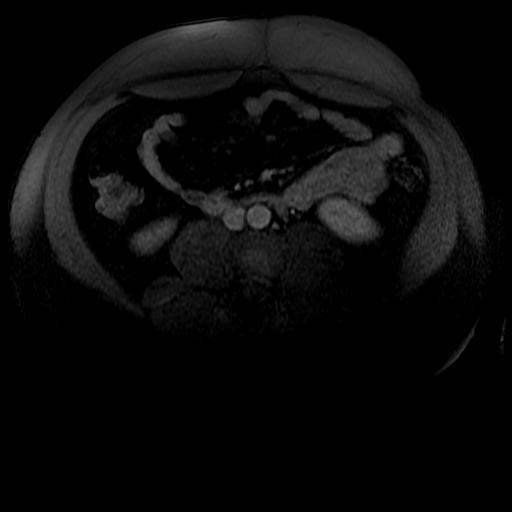

[22 of 48 positions shown; findings below may reference images not displayed]

FINDINGS: Lower chest: Lung bases are clear.

Hepatobiliary: Mildly macronodular hepatic contour, with prominence
of the caudate, suggesting early cirrhosis. No hepatic steatosis.

7 mm T2 hyperintense lesion in segment 4A (series 3/ image 29) with
early peripheral nodular arterial enhancement, likely reflecting a
benign hemangioma. 6 mm T2 hyperintense lesion in segment 5 (series
3/ image 33) with suspected peripheral rim arterial enhancement,
technically indeterminate but likely reflecting a benign hemangioma.

Gallbladder is within normal limits. No intrahepatic or extrahepatic
ductal dilatation.

Pancreas:  Within normal limits.

Spleen:  Normal in size.

Adrenals/Urinary Tract:  Adrenal glands are within normal limits.

Tiny renal cysts bilaterally.  No hydronephrosis.

Stomach/Bowel: Stomach is within normal limits.

Visualized bowel is unremarkable

Vascular/Lymphatic:  No evidence of abdominal aortic aneurysm.

10 mm gastrohepatic lymph node (series 3/image 36), likely reactive.

Other:  No abdominal ascites.

Musculoskeletal: No focal osseous lesions.
IMPRESSION: Two suspected benign hemangiomas measuring up to 7 mm, as described
above.

Suspected early cirrhosis.

## 2017-08-31 ENCOUNTER — Telehealth: Payer: Self-pay | Admitting: Family Medicine

## 2017-08-31 DIAGNOSIS — I10 Essential (primary) hypertension: Secondary | ICD-10-CM

## 2017-08-31 MED ORDER — CARVEDILOL 6.25 MG PO TABS: 6.2500 mg | ORAL_TABLET | Freq: Two times a day (BID) | ORAL | 1 refills | Status: DC

## 2017-08-31 MED ORDER — AMLODIPINE BESYLATE 10 MG PO TABS: 10.0000 mg | ORAL_TABLET | Freq: Every day | ORAL | 1 refills | Status: DC

## 2017-08-31 MED ORDER — HYDROCHLOROTHIAZIDE 25 MG PO TABS: 25.0000 mg | ORAL_TABLET | Freq: Every day | ORAL | 1 refills | Status: DC

## 2017-08-31 NOTE — Telephone Encounter (Signed)
Refilled

## 2017-08-31 NOTE — Telephone Encounter (Signed)
Patient came into office requesting an appt, next available is 12/14 but patient will run out of medication .  amLODipine (NORVASC) 10 MG tablet,carvedilol (COREG) 6.25 MG tablet,hydrochlorothiazide (HYDRODIURIL) 25 MG tablet. Please f/up

## 2017-10-12 ENCOUNTER — Ambulatory Visit: Payer: Medicare Other | Attending: Family Medicine | Admitting: Family Medicine

## 2017-10-12 ENCOUNTER — Encounter: Payer: Self-pay | Admitting: Family Medicine

## 2017-10-12 VITALS — BP 133/65 | HR 71 | Temp 97.4°F | Ht 72.0 in | Wt 220.8 lb

## 2017-10-12 DIAGNOSIS — Z86718 Personal history of other venous thrombosis and embolism: Secondary | ICD-10-CM | POA: Diagnosis not present

## 2017-10-12 DIAGNOSIS — Z79899 Other long term (current) drug therapy: Secondary | ICD-10-CM | POA: Diagnosis not present

## 2017-10-12 DIAGNOSIS — I10 Essential (primary) hypertension: Secondary | ICD-10-CM | POA: Diagnosis not present

## 2017-10-12 DIAGNOSIS — Z23 Encounter for immunization: Secondary | ICD-10-CM | POA: Insufficient documentation

## 2017-10-12 DIAGNOSIS — E785 Hyperlipidemia, unspecified: Secondary | ICD-10-CM | POA: Diagnosis not present

## 2017-10-12 DIAGNOSIS — E7800 Pure hypercholesterolemia, unspecified: Secondary | ICD-10-CM | POA: Insufficient documentation

## 2017-10-12 DIAGNOSIS — Z7982 Long term (current) use of aspirin: Secondary | ICD-10-CM | POA: Insufficient documentation

## 2017-10-12 DIAGNOSIS — M19042 Primary osteoarthritis, left hand: Secondary | ICD-10-CM | POA: Diagnosis not present

## 2017-10-12 DIAGNOSIS — M19041 Primary osteoarthritis, right hand: Secondary | ICD-10-CM | POA: Insufficient documentation

## 2017-10-12 MED ORDER — HYDROCHLOROTHIAZIDE 25 MG PO TABS: 25.0000 mg | ORAL_TABLET | Freq: Every day | ORAL | 1 refills | Status: DC

## 2017-10-12 MED ORDER — DICLOFENAC SODIUM 1 % TD GEL: 4.0000 g | Freq: Four times a day (QID) | TRANSDERMAL | 5 refills | Status: DC

## 2017-10-12 MED ORDER — CARVEDILOL 6.25 MG PO TABS: 6.2500 mg | ORAL_TABLET | Freq: Two times a day (BID) | ORAL | 1 refills | Status: DC

## 2017-10-12 MED ORDER — ATORVASTATIN CALCIUM 20 MG PO TABS: 20.0000 mg | ORAL_TABLET | Freq: Every day | ORAL | 1 refills | Status: DC

## 2017-10-12 MED ORDER — AMLODIPINE BESYLATE 10 MG PO TABS: 10.0000 mg | ORAL_TABLET | Freq: Every day | ORAL | 1 refills | Status: DC

## 2017-10-12 NOTE — Progress Notes (Signed)
Subjective:  Patient ID: Dustin [name], male    DOB: October 27, 1956  Age: 61 y.o. MRN: [id number]  CC: Hypertension   HPI Dustin [name] is a 61 year old male with a history of hypertension, hypercholesterolemia, osteoarthritis of the hands who presents today to get established with me as he was previously followed by Dr Adrian Blackwater.  He has been compliant with his antihypertensives and denies any adverse effects. Also tolerating his statin and denies myalgias. He is compliant with low-sodium, low-cholesterol diet and lipid panel from 02/2017 revealed LDL of 125.  He uses Voltaren gel intermittently for osteoarthritis of his hands and is recently status post steroid injection due to left middle trigger finger and is currently wearing a finger splint which helps with his symptoms. He would like to receive the flu shot today.   Past Medical History:  Diagnosis Date  . Arthritis    bil knees, left hand  . Carcinoid tumor   . DVT of upper extremity (deep vein thrombosis) (HCC)    left brachial vein, 12/2010  . Helicobacter pylori gastritis   . History of blood clots    to left arm  . Hypertension   . Shortness of breath dyspnea    with exertion  . Tubular adenoma of colon 2017    Past Surgical History:  Procedure Laterality Date  . colonscopy      1 year ago   . KNEE ARTHROPLASTY Right 04/30/2015   Procedure: COMPUTER ASSISTED TOTAL KNEE ARTHROPLASTY;  Surgeon: Marybelle Killings, MD;  Location: [facility];  Service: Orthopedics;  Laterality: Right;  . Left  Hand   2011   cyst removed.  Marland Kitchen TOTAL KNEE ARTHROPLASTY  10/19/2011   Procedure: TOTAL KNEE ARTHROPLASTY;  Surgeon: Sharmon Revere;  Location: [facility];  Service: Orthopedics;  Laterality: Left;    No Known Allergies   Outpatient Medications Prior to Visit  Medication Sig Dispense Refill  . aspirin EC 325 MG tablet Take 1 tablet (325 mg total) by mouth daily. 30 tablet 0  . vitamin E (VITAMIN E) 400 UNIT capsule Take 800 Units by mouth  daily.    Marland Kitchen amLODipine (NORVASC) 10 MG tablet Take 1 tablet (10 mg total) by mouth daily. 30 tablet 1  . atorvastatin (LIPITOR) 20 MG tablet Take 1 tablet (20 mg total) by mouth daily. 90 tablet 3  . carvedilol (COREG) 6.25 MG tablet Take 1 tablet (6.25 mg total) by mouth 2 (two) times daily with a meal. 60 tablet 1  . diclofenac sodium (VOLTAREN) 1 % GEL Apply 4 g topically 4 (four) times daily. 100 g 5  . hydrochlorothiazide (HYDRODIURIL) 25 MG tablet Take 1 tablet (25 mg total) by mouth daily. 30 tablet 1   No facility-administered medications prior to visit.     ROS Review of Systems  Constitutional: Negative for activity change and appetite change.  HENT: Negative for sinus pressure and sore throat.   Eyes: Negative for visual disturbance.  Respiratory: Negative for cough, chest tightness and shortness of breath.   Cardiovascular: Negative for chest pain and leg swelling.  Gastrointestinal: Negative for abdominal distention, abdominal pain, constipation and diarrhea.  Endocrine: Negative.   Genitourinary: Negative for dysuria.  Musculoskeletal:       See hpi  Skin: Negative for rash.  Allergic/Immunologic: Negative.   Neurological: Negative for weakness, light-headedness and numbness.  Psychiatric/Behavioral: Negative for dysphoric mood and suicidal ideas.    Objective:  BP 133/65   Pulse 71   Temp Marland Kitchen)  97.4 F (36.3 C) (Oral)   Ht 6' (1.829 m)   Wt 220 lb 12.8 oz (100.2 kg)   SpO2 97%   BMI 29.95 kg/m   BP/Weight 10/12/2017 04/04/2017 8/56/3149  Systolic BP 702 637 858  Diastolic BP 65 75 79  Wt. (Lbs) 220.8 215 217  BMI 29.95 29.16 30.27      Physical Exam  Constitutional: He is oriented to person, place, and time. He appears well-developed and well-nourished.  Cardiovascular: Normal rate, normal heart sounds and intact distal pulses.  No murmur heard. Pulmonary/Chest: Effort normal and breath sounds normal. He has no wheezes. He has no rales. He exhibits no  tenderness.  Abdominal: Soft. Bowel sounds are normal. He exhibits no distension and no mass. There is no tenderness.  Musculoskeletal: Normal range of motion.  Finger splint on left middle finger  Neurological: He is alert and oriented to person, place, and time.  Skin: Skin is warm and dry.  Psychiatric: He has a normal mood and affect.     CMP Latest Ref Rng & Units 03/15/2017 03/01/2017 07/18/2016  Glucose 65 - 99 mg/dL 101(H) 100(H) -  BUN 7 - 25 mg/dL 16 17 -  Creatinine 0.70 - 1.25 mg/dL 0.98 1.15 1.10  Sodium 135 - 146 mmol/L 137 136 -  Potassium 3.5 - 5.3 mmol/L 3.6 3.8 -  Chloride 98 - 110 mmol/L 101 97 -  CO2 20 - 31 mmol/L 21 24 -  Calcium 8.6 - 10.3 mg/dL 9.7 10.1 -  Total Protein 6.1 - 8.1 g/dL 7.7 7.8 -  Total Bilirubin 0.2 - 1.2 mg/dL 0.8 1.2 -  Alkaline Phos 40 - 115 U/L 49 55 -  AST 10 - 35 U/L 22 22 -  ALT 9 - 46 U/L 18 18 -    Lipid Panel     Component Value Date/Time   CHOL 194 03/01/2017 0914   TRIG 64 03/01/2017 0914   HDL 56 03/01/2017 0914   CHOLHDL 3.5 03/01/2017 0914   CHOLHDL 3.1 04/01/2014 1226   VLDL 9 04/01/2014 1226   LDLCALC 125 (H) 03/01/2017 0914    Assessment & Plan:   1. Essential hypertension, benign Controlled Low-sodium, DASH diet - amLODipine (NORVASC) 10 MG tablet; Take 1 tablet (10 mg total) by mouth daily.  Dispense: 90 tablet; Refill: 1 - carvedilol (COREG) 6.25 MG tablet; Take 1 tablet (6.25 mg total) by mouth 2 (two) times daily with a meal.  Dispense: 180 tablet; Refill: 1 - hydrochlorothiazide (HYDRODIURIL) 25 MG tablet; Take 1 tablet (25 mg total) by mouth daily.  Dispense: 90 tablet; Refill: 1 - CMP14+EGFR  2. Need for influenza vaccination - Flu Vaccine QUAD 36+ mos IM  3. Hyperlipidemia LDL goal <100 Controlled - atorvastatin (LIPITOR) 20 MG tablet; Take 1 tablet (20 mg total) by mouth daily.  Dispense: 90 tablet; Refill: 1  4. Primary osteoarthritis of both hands - diclofenac sodium (VOLTAREN) 1 % GEL; Apply 4 g  topically 4 (four) times daily.  Dispense: 100 g; Refill: 5   Meds ordered this encounter  Medications  . amLODipine (NORVASC) 10 MG tablet    Sig: Take 1 tablet (10 mg total) by mouth daily.    Dispense:  90 tablet    Refill:  1  . atorvastatin (LIPITOR) 20 MG tablet    Sig: Take 1 tablet (20 mg total) by mouth daily.    Dispense:  90 tablet    Refill:  1  . carvedilol (COREG) 6.25 MG tablet  Sig: Take 1 tablet (6.25 mg total) by mouth 2 (two) times daily with a meal.    Dispense:  180 tablet    Refill:  1  . hydrochlorothiazide (HYDRODIURIL) 25 MG tablet    Sig: Take 1 tablet (25 mg total) by mouth daily.    Dispense:  90 tablet    Refill:  1  . diclofenac sodium (VOLTAREN) 1 % GEL    Sig: Apply 4 g topically 4 (four) times daily.    Dispense:  100 g    Refill:  5    Follow-up: Return in about 6 months (around 04/12/2018) for follow up of Hypertension and Hypercholesterolemia.   Arnoldo Morale MD

## 2017-10-12 NOTE — Patient Instructions (Signed)

## 2017-10-13 LAB — CMP14+EGFR
ALBUMIN: 4.9 g/dL — AB (ref 3.6–4.8)
ALT: 22 IU/L (ref 0–44)
AST: 28 IU/L (ref 0–40)
Albumin/Globulin Ratio: 1.5 (ref 1.2–2.2)
Alkaline Phosphatase: 67 IU/L (ref 39–117)
BUN / CREAT RATIO: 17 (ref 10–24)
BUN: 18 mg/dL (ref 8–27)
Bilirubin Total: 1.4 mg/dL — ABNORMAL HIGH (ref 0.0–1.2)
CO2: 23 mmol/L (ref 20–29)
CREATININE: 1.07 mg/dL (ref 0.76–1.27)
Calcium: 9.9 mg/dL (ref 8.6–10.2)
Chloride: 100 mmol/L (ref 96–106)
GFR, EST AFRICAN AMERICAN: 86 mL/min/{1.73_m2} (ref >59)
GFR, EST NON AFRICAN AMERICAN: 75 mL/min/{1.73_m2} (ref >59)
GLOBULIN, TOTAL: 3.2 g/dL (ref 1.5–4.5)
Glucose: 85 mg/dL (ref 65–99)
Potassium: 3.7 mmol/L (ref 3.5–5.2)
SODIUM: 139 mmol/L (ref 134–144)
Total Protein: 8.1 g/dL (ref 6.0–8.5)

## 2018-02-19 ENCOUNTER — Other Ambulatory Visit: Payer: Self-pay | Admitting: Family Medicine

## 2018-02-19 DIAGNOSIS — E785 Hyperlipidemia, unspecified: Secondary | ICD-10-CM

## 2018-03-17 ENCOUNTER — Other Ambulatory Visit: Payer: Self-pay | Admitting: Family Medicine

## 2018-03-17 DIAGNOSIS — E785 Hyperlipidemia, unspecified: Secondary | ICD-10-CM

## 2018-03-19 ENCOUNTER — Ambulatory Visit (INDEPENDENT_AMBULATORY_CARE_PROVIDER_SITE_OTHER): Payer: Medicare Other | Admitting: Internal Medicine

## 2018-03-19 ENCOUNTER — Encounter: Payer: Self-pay | Admitting: Internal Medicine

## 2018-03-19 VITALS — BP 131/69 | HR 60 | Temp 98.3°F | Wt 223.0 lb

## 2018-03-19 DIAGNOSIS — K7460 Unspecified cirrhosis of liver: Secondary | ICD-10-CM | POA: Diagnosis not present

## 2018-03-19 DIAGNOSIS — B182 Chronic viral hepatitis C: Secondary | ICD-10-CM | POA: Diagnosis not present

## 2018-03-19 NOTE — Progress Notes (Signed)
   Subjective:    Patient ID: Dustin [name], male    DOB: [birth year], 62 y.o.   MRN: [id number]  HPI Here for follow up of cirrhosis.  Completed HCV treatment and achieved SVR.  Last screening ultrasound May 2018.  No new issues.  No alcohol.    Review of Systems  Constitutional: Negative for fatigue.  Gastrointestinal: Negative for diarrhea.  Skin: Negative for rash.       Objective:   Physical Exam  Constitutional: He appears well-developed and well-nourished. No distress.  Eyes: No scleral icterus.  Cardiovascular: Normal rate, regular rhythm and normal heart sounds.  No murmur heard. Pulmonary/Chest: Effort normal and breath sounds normal. No respiratory distress.  Skin: No rash noted.          Assessment & Plan:

## 2018-03-19 NOTE — Assessment & Plan Note (Signed)
Completed treatment and achieved svr.

## 2018-03-19 NOTE — Assessment & Plan Note (Signed)
Doing well.  Will do [place] screening now and in 6 months.  rtc 1 year.

## 2018-04-19 ENCOUNTER — Ambulatory Visit
Admission: RE | Admit: 2018-04-19 | Discharge: 2018-04-19 | Disposition: A | Discharge status: Home / Self Care | Payer: Medicare Other | Source: Ambulatory Visit | Attending: Internal Medicine | Admitting: Internal Medicine

## 2018-04-19 DIAGNOSIS — K7460 Unspecified cirrhosis of liver: Secondary | ICD-10-CM

## 2018-05-07 ENCOUNTER — Other Ambulatory Visit: Payer: Self-pay | Admitting: Family Medicine

## 2018-05-07 DIAGNOSIS — I10 Essential (primary) hypertension: Secondary | ICD-10-CM

## 2018-05-22 ENCOUNTER — Encounter: Payer: Self-pay | Admitting: Family Medicine

## 2018-05-22 ENCOUNTER — Ambulatory Visit: Payer: Medicare Other | Attending: Family Medicine | Admitting: Family Medicine

## 2018-05-22 DIAGNOSIS — I10 Essential (primary) hypertension: Secondary | ICD-10-CM | POA: Insufficient documentation

## 2018-05-22 DIAGNOSIS — E7800 Pure hypercholesterolemia, unspecified: Secondary | ICD-10-CM | POA: Diagnosis not present

## 2018-05-22 DIAGNOSIS — M65332 Trigger finger, left middle finger: Secondary | ICD-10-CM | POA: Diagnosis not present

## 2018-05-22 DIAGNOSIS — Z79899 Other long term (current) drug therapy: Secondary | ICD-10-CM | POA: Insufficient documentation

## 2018-05-22 DIAGNOSIS — Z7982 Long term (current) use of aspirin: Secondary | ICD-10-CM | POA: Diagnosis not present

## 2018-05-22 DIAGNOSIS — E785 Hyperlipidemia, unspecified: Secondary | ICD-10-CM

## 2018-05-22 DIAGNOSIS — Z86718 Personal history of other venous thrombosis and embolism: Secondary | ICD-10-CM | POA: Insufficient documentation

## 2018-05-22 MED ORDER — ZOSTER VAC RECOMB ADJUVANTED 50 MCG/0.5ML IM SUSR: 0.5000 mL | Freq: Once | INTRAMUSCULAR | 0 refills | Status: AC

## 2018-05-22 MED ORDER — HYDROCHLOROTHIAZIDE 25 MG PO TABS: 25.0000 mg | ORAL_TABLET | Freq: Every day | ORAL | 1 refills | Status: DC

## 2018-05-22 MED ORDER — ATORVASTATIN CALCIUM 20 MG PO TABS: 20.0000 mg | ORAL_TABLET | Freq: Every day | ORAL | 1 refills | Status: DC

## 2018-05-22 MED ORDER — DICLOFENAC SODIUM 1 % TD GEL: 4.0000 g | Freq: Four times a day (QID) | TRANSDERMAL | 5 refills | Status: DC

## 2018-05-22 MED ORDER — CARVEDILOL 6.25 MG PO TABS: 6.2500 mg | ORAL_TABLET | Freq: Two times a day (BID) | ORAL | 1 refills | Status: DC

## 2018-05-22 MED ORDER — AMLODIPINE BESYLATE 10 MG PO TABS: 10.0000 mg | ORAL_TABLET | Freq: Every day | ORAL | 1 refills | Status: DC

## 2018-05-22 NOTE — Progress Notes (Signed)
Subjective:  Patient ID: Dustin [name], male    DOB: 05-02-1956  Age: 62 y.o. MRN: [id number]  CC: Hypertension   HPI INAKI VANTINE  is a 62 year old male with a history of hypertension, hypercholesterolemia, osteoarthritis of the hands who presents today for follow-up visit. He does have a left middle finger trigger finger and received cortisone injections last year from his Orthopedic -Dr. Lorin Mercy.  He currently wears a splint as that assist with his pain and catching of his finger. Doing well on his antihypertensive and statin and denies adverse effects of myalgias; does not exercise regularly. He would like to have a 30-monthrefill of his medications With regards to healthcare maintenance he is due for Shingrix vaccine but on finding out he would have a co pay as per his insurance he declines. Denies chest pains or shortness of breath and he has no additional concerns today  Past Medical History:  Diagnosis Date  . Arthritis    bil knees, left hand  . Carcinoid tumor   . DVT of upper extremity (deep vein thrombosis) (HCC)    left brachial vein, 12/2010  . Helicobacter pylori gastritis   . History of blood clots    to left arm  . Hypertension   . Shortness of breath dyspnea    with exertion  . Tubular adenoma of colon 2017    Past Surgical History:  Procedure Laterality Date  . colonscopy      1 year ago   . KNEE ARTHROPLASTY Right 04/30/2015   Procedure: COMPUTER ASSISTED TOTAL KNEE ARTHROPLASTY;  Surgeon: MMarybelle Killings MD;  Location: [facility]  Service: Orthopedics;  Laterality: Right;  . Left  Hand   2011   cyst removed.  .Marland KitchenTOTAL KNEE ARTHROPLASTY  10/19/2011   Procedure: TOTAL KNEE ARTHROPLASTY;  Surgeon: ASharmon Revere  [place] [facility]  Service: Orthopedics;  Laterality: Left;    No Known Allergies   Outpatient Medications Prior to Visit  Medication Sig Dispense Refill  . aspirin EC 325 MG tablet Take 1 tablet (325 mg total) by mouth daily. 30 tablet 0  .  vitamin E (VITAMIN E) 400 UNIT capsule Take 800 Units by mouth daily.    .Marland KitchenamLODipine (NORVASC) 10 MG tablet Take 1 tablet (10 mg total) by mouth daily. 90 tablet 1  . atorvastatin (LIPITOR) 20 MG tablet TAKE 1 TABLET BY MOUTH DAILY 90 tablet 0  . carvedilol (COREG) 6.25 MG tablet Take 1 tablet (6.25 mg total) by mouth 2 (two) times daily with a meal. 180 tablet 1  . diclofenac sodium (VOLTAREN) 1 % GEL Apply 4 g topically 4 (four) times daily. 100 g 5  . hydrochlorothiazide (HYDRODIURIL) 25 MG tablet Take 1 tablet (25 mg total) by mouth daily. MUST HAVE APPT FOR FURTHER REFILLS 30 tablet 0   No facility-administered medications prior to visit.     ROS Review of Systems  Constitutional: Negative for activity change and appetite change.  HENT: Negative for sinus pressure and sore throat.   Eyes: Negative for visual disturbance.  Respiratory: Negative for cough, chest tightness and shortness of breath.   Cardiovascular: Negative for chest pain and leg swelling.  Gastrointestinal: Negative for abdominal distention, abdominal pain, constipation and diarrhea.  Endocrine: Negative.   Genitourinary: Negative for dysuria.  Musculoskeletal: Negative for joint swelling and myalgias.       See hpi  Skin: Negative for rash.  Allergic/Immunologic: Negative.   Neurological: Negative for weakness, light-headedness and  numbness.  Psychiatric/Behavioral: Negative for dysphoric mood and suicidal ideas.    Objective:  BP 125/68   Pulse 72   Temp 97.9 F (36.6 C) (Oral)   Ht 6' (1.829 m)   Wt 224 lb 3.2 oz (101.7 kg)   SpO2 98%   BMI 30.41 kg/m   BP/Weight 05/22/2018 03/19/2018 46/27/0350  Systolic BP 093 818 299  Diastolic BP 68 69 65  Wt. (Lbs) 224.2 223 220.8  BMI 30.41 30.24 29.95     Physical Exam  Constitutional: He is oriented to person, place, and time. He appears well-developed and well-nourished.  Cardiovascular: Normal rate, normal heart sounds and intact distal pulses.  No  murmur heard. Pulmonary/Chest: Effort normal and breath sounds normal. He has no wheezes. He has no rales. He exhibits no tenderness.  Abdominal: Soft. Bowel sounds are normal. He exhibits no distension and no mass. There is no tenderness.  Musculoskeletal:  Left middle finger in a splint  Neurological: He is alert and oriented to person, place, and time.  Skin: Skin is warm and dry.  Psychiatric: He has a normal mood and affect.    CMP Latest Ref Rng & Units 10/12/2017 03/15/2017 03/01/2017  Glucose 65 - 99 mg/dL 85 101(H) 100(H)  BUN 8 - 27 mg/dL 18 16 17   Creatinine 0.76 - 1.27 mg/dL 1.07 0.98 1.15  Sodium 134 - 144 mmol/L 139 137 136  Potassium 3.5 - 5.2 mmol/L 3.7 3.6 3.8  Chloride 96 - 106 mmol/L 100 101 97  CO2 20 - 29 mmol/L 23 21 24   Calcium 8.6 - 10.2 mg/dL 9.9 9.7 10.1  Total Protein 6.0 - 8.5 g/dL 8.1 7.7 7.8  Total Bilirubin 0.0 - 1.2 mg/dL 1.4(H) 0.8 1.2  Alkaline Phos 39 - 117 IU/L 67 49 55  AST 0 - 40 IU/L 28 22 22   ALT 0 - 44 IU/L 22 18 18     Lipid Panel     Component Value Date/Time   CHOL 194 03/01/2017 0914   TRIG 64 03/01/2017 0914   HDL 56 03/01/2017 0914   CHOLHDL 3.5 03/01/2017 0914   CHOLHDL 3.1 04/01/2014 1226   VLDL 9 04/01/2014 1226   LDLCALC 125 (H) 03/01/2017 0914     Assessment & Plan:   1. Essential hypertension, benign Controlled Counseled on blood pressure goal of less than 130/80, low-sodium, DASH diet, medication compliance, 150 minutes of moderate intensity exercise per week. Discussed medication compliance, adverse effects. - CMP14+EGFR; Future - hydrochlorothiazide (HYDRODIURIL) 25 MG tablet; Take 1 tablet (25 mg total) by mouth daily.  Dispense: 90 tablet; Refill: 1 - carvedilol (COREG) 6.25 MG tablet; Take 1 tablet (6.25 mg total) by mouth 2 (two) times daily with a meal.  Dispense: 180 tablet; Refill: 1 - amLODipine (NORVASC) 10 MG tablet; Take 1 tablet (10 mg total) by mouth daily.  Dispense: 90 tablet; Refill: 1  2.  Hyperlipidemia LDL goal <100 Controlled - Lipid panel; Future - atorvastatin (LIPITOR) 20 MG tablet; Take 1 tablet (20 mg total) by mouth daily.  Dispense: 90 tablet; Refill: 1  3. Trigger middle finger of left hand Stable Status post cortisone injections by orthopedics last year - diclofenac sodium (VOLTAREN) 1 % GEL; Apply 4 g topically 4 (four) times daily.  Dispense: 100 g; Refill: 5  Shingrix vaccine ordered which he declines on discovering he would have a co-pay as per his insurance guidelines.  Meds ordered this encounter  Medications  . hydrochlorothiazide (HYDRODIURIL) 25 MG tablet  Sig: Take 1 tablet (25 mg total) by mouth daily.    Dispense:  90 tablet    Refill:  1  . carvedilol (COREG) 6.25 MG tablet    Sig: Take 1 tablet (6.25 mg total) by mouth 2 (two) times daily with a meal.    Dispense:  180 tablet    Refill:  1  . atorvastatin (LIPITOR) 20 MG tablet    Sig: Take 1 tablet (20 mg total) by mouth daily.    Dispense:  90 tablet    Refill:  1    **Patient requests 90 days supply**  . amLODipine (NORVASC) 10 MG tablet    Sig: Take 1 tablet (10 mg total) by mouth daily.    Dispense:  90 tablet    Refill:  1  . diclofenac sodium (VOLTAREN) 1 % GEL    Sig: Apply 4 g topically 4 (four) times daily.    Dispense:  100 g    Refill:  5  . Zoster Vaccine Adjuvanted [facility]) injection    Sig: Inject 0.5 mLs into the muscle once for 1 dose.    Dispense:  0.5 mL    Refill:  0    Follow-up: Return in about 6 months (around 11/22/2018) for follow up of chronic medical conditions.   Charlott Rakes MD

## 2018-05-22 NOTE — Patient Instructions (Signed)

## 2018-05-23 ENCOUNTER — Other Ambulatory Visit: Payer: Self-pay

## 2018-05-23 ENCOUNTER — Ambulatory Visit (HOSPITAL_BASED_OUTPATIENT_CLINIC_OR_DEPARTMENT_OTHER): Payer: Medicare Other | Admitting: Pharmacist

## 2018-05-23 ENCOUNTER — Ambulatory Visit: Payer: Medicare Other | Attending: Family Medicine

## 2018-05-23 ENCOUNTER — Encounter: Payer: Self-pay | Admitting: Pharmacist

## 2018-05-23 DIAGNOSIS — E785 Hyperlipidemia, unspecified: Secondary | ICD-10-CM | POA: Diagnosis not present

## 2018-05-23 DIAGNOSIS — I10 Essential (primary) hypertension: Secondary | ICD-10-CM | POA: Insufficient documentation

## 2018-05-23 DIAGNOSIS — Z23 Encounter for immunization: Secondary | ICD-10-CM

## 2018-05-23 MED ORDER — ZOSTER VAC RECOMB ADJUVANTED 50 MCG/0.5ML IM SUSR: 0.5000 mL | Freq: Once | INTRAMUSCULAR | 1 refills | Status: AC

## 2018-05-23 MED FILL — SHINGRIX VIAL KIT: 50 | 1 days supply | Qty: 1 | Fill #0

## 2018-05-23 NOTE — Progress Notes (Signed)
Patient here for lab visit  

## 2018-05-23 NOTE — Progress Notes (Signed)
Patient presents to clinic for Shingrix vaccine administration. Has completed pre-visit questionnaire which included documented consent. No contraindications exist. Vaccine administered. Patient with no reactions to vaccine upon discharge.    Results and documentation to be scanned in to [facility].   Vaccine given by:  Ricardo Jericho, PharmD Candidate [employer] [place] [employer] Class of [phone]  Supervision provided by:  Benard Halsted, PharmD, [place] [phone]

## 2018-05-24 LAB — LIPID PANEL
CHOLESTEROL TOTAL: 116 mg/dL (ref 100–199)
Chol/HDL Ratio: 2.3 ratio (ref 0.0–5.0)
HDL: 50 mg/dL (ref >39)
LDL Calculated: 56 mg/dL (ref 0–99)
TRIGLYCERIDES: 49 mg/dL (ref 0–149)
VLDL Cholesterol Cal: 10 mg/dL (ref 5–40)

## 2018-05-24 LAB — CMP14+EGFR
A/G RATIO: 1.5 (ref 1.2–2.2)
ALBUMIN: 4.7 g/dL (ref 3.6–4.8)
ALK PHOS: 62 IU/L (ref 39–117)
ALT: 30 IU/L (ref 0–44)
AST: 31 IU/L (ref 0–40)
BILIRUBIN TOTAL: 1.1 mg/dL (ref 0.0–1.2)
BUN / CREAT RATIO: 22 (ref 10–24)
BUN: 21 mg/dL (ref 8–27)
CHLORIDE: 101 mmol/L (ref 96–106)
CO2: 21 mmol/L (ref 20–29)
Calcium: 9.5 mg/dL (ref 8.6–10.2)
Creatinine, Ser: 0.94 mg/dL (ref 0.76–1.27)
GFR calc non Af Amer: 87 mL/min/{1.73_m2} (ref >59)
GFR, EST AFRICAN AMERICAN: 100 mL/min/{1.73_m2} (ref >59)
GLOBULIN, TOTAL: 3.1 g/dL (ref 1.5–4.5)
GLUCOSE: 97 mg/dL (ref 65–99)
POTASSIUM: 4.1 mmol/L (ref 3.5–5.2)
SODIUM: 139 mmol/L (ref 134–144)
TOTAL PROTEIN: 7.8 g/dL (ref 6.0–8.5)

## 2018-07-09 ENCOUNTER — Ambulatory Visit
Admission: RE | Admit: 2018-07-09 | Discharge: 2018-07-09 | Disposition: A | Discharge status: Home / Self Care | Payer: Medicare Other | Source: Ambulatory Visit | Attending: Internal Medicine | Admitting: Internal Medicine

## 2018-07-09 DIAGNOSIS — K7460 Unspecified cirrhosis of liver: Secondary | ICD-10-CM

## 2018-07-09 DIAGNOSIS — R1011 Right upper quadrant pain: Secondary | ICD-10-CM | POA: Diagnosis not present

## 2018-09-12 ENCOUNTER — Other Ambulatory Visit: Payer: Self-pay | Admitting: Family Medicine

## 2018-09-12 DIAGNOSIS — I10 Essential (primary) hypertension: Secondary | ICD-10-CM

## 2018-11-11 ENCOUNTER — Ambulatory Visit: Payer: Medicare Other | Attending: Family Medicine | Admitting: Family Medicine

## 2018-11-11 ENCOUNTER — Other Ambulatory Visit: Payer: Self-pay | Admitting: Family Medicine

## 2018-11-11 ENCOUNTER — Encounter: Payer: Self-pay | Admitting: Family Medicine

## 2018-11-11 DIAGNOSIS — E785 Hyperlipidemia, unspecified: Secondary | ICD-10-CM

## 2018-11-11 DIAGNOSIS — I10 Essential (primary) hypertension: Secondary | ICD-10-CM | POA: Diagnosis not present

## 2018-11-11 DIAGNOSIS — M65332 Trigger finger, left middle finger: Secondary | ICD-10-CM

## 2018-11-11 MED ORDER — AMLODIPINE BESYLATE 10 MG PO TABS: 10.0000 mg | ORAL_TABLET | Freq: Every day | ORAL | 1 refills | Status: DC

## 2018-11-11 MED ORDER — CARVEDILOL 6.25 MG PO TABS: 6.2500 mg | ORAL_TABLET | Freq: Two times a day (BID) | ORAL | 1 refills | Status: DC

## 2018-11-11 MED ORDER — HYDROCHLOROTHIAZIDE 25 MG PO TABS: 25.0000 mg | ORAL_TABLET | Freq: Every day | ORAL | 1 refills | Status: DC

## 2018-11-11 MED ORDER — ATORVASTATIN CALCIUM 20 MG PO TABS: 20.0000 mg | ORAL_TABLET | Freq: Every day | ORAL | 1 refills | Status: DC

## 2018-11-11 NOTE — Progress Notes (Signed)
Subjective:  Patient ID: Dustin [name], male    DOB: Jun 22, 1956  Age: 63 y.o. MRN: [id number]  CC: Hypertension   HPI Dustin [name] s a 63 year old male with a history of hypertension, hypercholesterolemia, osteoarthritis of the hands who presents today for follow-up visit. He has been compliant with his antihypertensive and statin and denies adverse effects from his medications and his blood pressure and cholesterol are under control. He does have left middle finger trigger finger status post cortisone injection by orthopedics, Dr. Lorin Mercy and currently wears a splint with control of symptoms. He has no additional concerns today.  Past Medical History:  Diagnosis Date  . Arthritis    bil knees, left hand  . Carcinoid tumor   . DVT of upper extremity (deep vein thrombosis) (HCC)    left brachial vein, 12/2010  . Helicobacter pylori gastritis   . History of blood clots    to left arm  . Hypertension   . Shortness of breath dyspnea    with exertion  . Tubular adenoma of colon 2017    Past Surgical History:  Procedure Laterality Date  . colonscopy      1 year ago   . KNEE ARTHROPLASTY Right 04/30/2015   Procedure: COMPUTER ASSISTED TOTAL KNEE ARTHROPLASTY;  Surgeon: Marybelle Killings, MD;  Location: [place];  Service: Orthopedics;  Laterality: Right;  . Left  Hand   2011   cyst removed.  Marland Kitchen TOTAL KNEE ARTHROPLASTY  10/19/2011   Procedure: TOTAL KNEE ARTHROPLASTY;  Surgeon: Sharmon Revere;  Location: [place];  Service: Orthopedics;  Laterality: Left;    No Known Allergies   Outpatient Medications Prior to Visit  Medication Sig Dispense Refill  . aspirin EC 325 MG tablet Take 1 tablet (325 mg total) by mouth daily. 30 tablet 0  . diclofenac sodium (VOLTAREN) 1 % GEL Apply 4 g topically 4 (four) times daily. 100 g 5  . vitamin E (VITAMIN E) 400 UNIT capsule Take 800 Units by mouth daily.    Marland Kitchen amLODipine (NORVASC) 10 MG tablet TAKE 1 TABLET(10 MG) BY MOUTH DAILY 90 tablet 0  .  atorvastatin (LIPITOR) 20 MG tablet Take 1 tablet (20 mg total) by mouth daily. 90 tablet 1  . carvedilol (COREG) 6.25 MG tablet Take 1 tablet (6.25 mg total) by mouth 2 (two) times daily with a meal. 180 tablet 1  . hydrochlorothiazide (HYDRODIURIL) 25 MG tablet TAKE 1 TABLET(25 MG) BY MOUTH DAILY 90 tablet 0   No facility-administered medications prior to visit.     ROS Review of Systems  Constitutional: Negative for activity change and appetite change.  HENT: Negative for sinus pressure and sore throat.   Eyes: Negative for visual disturbance.  Respiratory: Negative for cough, chest tightness and shortness of breath.   Cardiovascular: Negative for chest pain and leg swelling.  Gastrointestinal: Negative for abdominal distention, abdominal pain, constipation and diarrhea.  Endocrine: Negative.   Genitourinary: Negative for dysuria.  Musculoskeletal: Negative for joint swelling and myalgias.  Skin: Negative for rash.  Allergic/Immunologic: Negative.   Neurological: Negative for weakness, light-headedness and numbness.  Psychiatric/Behavioral: Negative for dysphoric mood and suicidal ideas.    Objective:  BP 136/79   Pulse 80   Temp 97.7 F (36.5 C) (Oral)   Ht 6' (1.829 m)   Wt 216 lb 9.6 oz (98.2 kg)   SpO2 97%   BMI 29.38 kg/m   BP/Weight 11/11/2018 05/22/2018 7/78/2423  Systolic BP 536 144 315  Diastolic BP 79 68 69  Wt. (Lbs) 216.6 224.2 223  BMI 29.38 30.41 30.24      Physical Exam Constitutional:      Appearance: He is well-developed.  Cardiovascular:     Rate and Rhythm: Normal rate.     Heart sounds: Murmur (2/6 systolic) present.  Pulmonary:     Effort: Pulmonary effort is normal.     Breath sounds: Normal breath sounds. No wheezing or rales.  Chest:     Chest wall: No tenderness.  Abdominal:     General: Bowel sounds are normal. There is no distension.     Palpations: Abdomen is soft. There is no mass.     Tenderness: There is no abdominal  tenderness.  Musculoskeletal:     Comments: Left middle finger with splint in place  Neurological:     Mental Status: He is alert and oriented to person, place, and time.     CMP Latest Ref Rng & Units 11/11/2018 05/23/2018 10/12/2017  Glucose 65 - 99 mg/dL 99 97 85  BUN 8 - 27 mg/dL 22 21 18   Creatinine 0.76 - 1.27 mg/dL 1.19 0.94 1.07  Sodium 134 - 144 mmol/L 137 139 139  Potassium 3.5 - 5.2 mmol/L 3.5 4.1 3.7  Chloride 96 - 106 mmol/L 96 101 100  CO2 20 - 29 mmol/L 23 21 23   Calcium 8.6 - 10.2 mg/dL 10.3(H) 9.5 9.9  Total Protein 6.0 - 8.5 g/dL - 7.8 8.1  Total Bilirubin 0.0 - 1.2 mg/dL - 1.1 1.4(H)  Alkaline Phos 39 - 117 IU/L - 62 67  AST 0 - 40 IU/L - 31 28  ALT 0 - 44 IU/L - 30 22    Lipid Panel     Component Value Date/Time   CHOL 116 05/23/2018 0917   TRIG 49 05/23/2018 0917   HDL 50 05/23/2018 0917   CHOLHDL 2.3 05/23/2018 0917   CHOLHDL 3.1 04/01/2014 1226   VLDL 9 04/01/2014 1226   LDLCALC 56 05/23/2018 0917     Assessment & Plan:   1. Essential hypertension, benign Controlled Counseled on blood pressure goal of less than 130/80, low-sodium, DASH diet, medication compliance, 150 minutes of moderate intensity exercise per week. Discussed medication compliance, adverse effects. - Basic Metabolic Panel - amLODipine (NORVASC) 10 MG tablet; Take 1 tablet (10 mg total) by mouth daily.  Dispense: 90 tablet; Refill: 1 - carvedilol (COREG) 6.25 MG tablet; Take 1 tablet (6.25 mg total) by mouth 2 (two) times daily with a meal.  Dispense: 180 tablet; Refill: 1 - hydrochlorothiazide (HYDRODIURIL) 25 MG tablet; Take 1 tablet (25 mg total) by mouth daily.  Dispense: 90 tablet; Refill: 1  2. Hyperlipidemia LDL goal <100 Controlled - atorvastatin (LIPITOR) 20 MG tablet; Take 1 tablet (20 mg total) by mouth daily.  Dispense: 90 tablet; Refill: 1   Meds ordered this encounter  Medications  . amLODipine (NORVASC) 10 MG tablet    Sig: Take 1 tablet (10 mg total) by mouth  daily.    Dispense:  90 tablet    Refill:  1  . atorvastatin (LIPITOR) 20 MG tablet    Sig: Take 1 tablet (20 mg total) by mouth daily.    Dispense:  90 tablet    Refill:  1    **Patient requests 90 days supply**  . carvedilol (COREG) 6.25 MG tablet    Sig: Take 1 tablet (6.25 mg total) by mouth 2 (two) times daily with a meal.    Dispense:  180 tablet    Refill:  1  . hydrochlorothiazide (HYDRODIURIL) 25 MG tablet    Sig: Take 1 tablet (25 mg total) by mouth daily.    Dispense:  90 tablet    Refill:  1    Follow-up: Return in about 6 months (around 05/12/2019) for Follow-up of chronic medical conditions.   Charlott Rakes MD

## 2018-11-11 NOTE — Patient Instructions (Signed)

## 2018-11-12 LAB — BASIC METABOLIC PANEL
BUN / CREAT RATIO: 18 (ref 10–24)
BUN: 22 mg/dL (ref 8–27)
CO2: 23 mmol/L (ref 20–29)
Calcium: 10.3 mg/dL — ABNORMAL HIGH (ref 8.6–10.2)
Chloride: 96 mmol/L (ref 96–106)
Creatinine, Ser: 1.19 mg/dL (ref 0.76–1.27)
GFR calc Af Amer: 75 mL/min/{1.73_m2} (ref >59)
GFR calc non Af Amer: 65 mL/min/{1.73_m2} (ref >59)
Glucose: 99 mg/dL (ref 65–99)
Potassium: 3.5 mmol/L (ref 3.5–5.2)
Sodium: 137 mmol/L (ref 134–144)

## 2019-03-11 ENCOUNTER — Other Ambulatory Visit: Payer: Self-pay | Admitting: Family Medicine

## 2019-03-11 DIAGNOSIS — E785 Hyperlipidemia, unspecified: Secondary | ICD-10-CM

## 2019-04-03 ENCOUNTER — Encounter: Payer: Self-pay | Admitting: Family Medicine

## 2019-04-03 ENCOUNTER — Other Ambulatory Visit: Payer: Self-pay

## 2019-04-03 ENCOUNTER — Ambulatory Visit: Payer: Medicare Other | Attending: Family Medicine | Admitting: Family Medicine

## 2019-04-03 ENCOUNTER — Other Ambulatory Visit: Payer: Self-pay | Admitting: Family Medicine

## 2019-04-03 DIAGNOSIS — E785 Hyperlipidemia, unspecified: Secondary | ICD-10-CM

## 2019-04-03 DIAGNOSIS — I10 Essential (primary) hypertension: Secondary | ICD-10-CM

## 2019-04-03 DIAGNOSIS — M79642 Pain in left hand: Secondary | ICD-10-CM | POA: Diagnosis not present

## 2019-04-03 MED ORDER — MELOXICAM 7.5 MG PO TABS: 7.5000 mg | ORAL_TABLET | Freq: Every day | ORAL | 1 refills | Status: DC

## 2019-04-03 MED ORDER — ATORVASTATIN CALCIUM 20 MG PO TABS: ORAL_TABLET | ORAL | 1 refills | Status: DC

## 2019-04-03 MED ORDER — HYDROCHLOROTHIAZIDE 25 MG PO TABS: 25.0000 mg | ORAL_TABLET | Freq: Every day | ORAL | 1 refills | Status: DC

## 2019-04-03 MED ORDER — CARVEDILOL 6.25 MG PO TABS: 6.2500 mg | ORAL_TABLET | Freq: Two times a day (BID) | ORAL | 1 refills | Status: DC

## 2019-04-03 MED ORDER — AMLODIPINE BESYLATE 10 MG PO TABS: 10.0000 mg | ORAL_TABLET | Freq: Every day | ORAL | 1 refills | Status: DC

## 2019-04-03 NOTE — Progress Notes (Signed)
Patient has been called and DOB has been verified. Patient has been screened and transferred to PCP to start phone visit.     

## 2019-04-03 NOTE — Progress Notes (Signed)
Virtual Visit via Telephone Note  I connected with Dustin [name], on 04/03/2019 at 10:54 AM by telephone due to the COVID-19 pandemic and verified that I am speaking with the correct person using two identifiers.   Consent: I discussed the limitations, risks, security and privacy concerns of performing an evaluation and management service by telephone and the availability of in person appointments. I also discussed with the patient that there may be a patient responsible charge related to this service. The patient expressed understanding and agreed to proceed.   Location of Patient: Home  Location of Provider: Clinic   Persons participating in Telemedicine visit: Dvid Pendry Farrington-CMA Dr. Felecia Shelling    History of Present Illness: Dustin [name] is a 63 year old male with a history of hypertension, hypercholesterolemia, osteoarthritis of the hands who presents today for follow-up visit. He complains of pain in the thenar eminence of his left hand but denies presence of swelling and currently has no analgesic for pain relief.  He is able to open jars and denies numbness or tingling.  Pain is described as mild does not radiate and is not worse at certain times of the day. He has been compliant with his antihypertensive and his statin and denies adverse effects from his medications. He denies chest pains, pedal edema.   Past Medical History:  Diagnosis Date  . Arthritis    bil knees, left hand  . Carcinoid tumor   . DVT of upper extremity (deep vein thrombosis) (HCC)    left brachial vein, 12/2010  . Helicobacter pylori gastritis   . History of blood clots    to left arm  . Hypertension   . Shortness of breath dyspnea    with exertion  . Tubular adenoma of colon 2017   No Known Allergies  Current Outpatient Medications on File Prior to Visit  Medication Sig Dispense Refill  . amLODipine (NORVASC) 10 MG tablet Take 1 tablet (10 mg total) by mouth daily. 90  tablet 1  . aspirin EC 325 MG tablet Take 1 tablet (325 mg total) by mouth daily. 30 tablet 0  . atorvastatin (LIPITOR) 20 MG tablet TAKE 1 TABLET(20 MG) BY MOUTH DAILY 90 tablet 0  . carvedilol (COREG) 6.25 MG tablet Take 1 tablet (6.25 mg total) by mouth 2 (two) times daily with a meal. 180 tablet 1  . diclofenac sodium (VOLTAREN) 1 % GEL Apply 4 g topically 4 (four) times daily. 100 g 5  . hydrochlorothiazide (HYDRODIURIL) 25 MG tablet Take 1 tablet (25 mg total) by mouth daily. 90 tablet 1  . vitamin E (VITAMIN E) 400 UNIT capsule Take 800 Units by mouth daily.     No current facility-administered medications on file prior to visit.     Observations/Objective: Awake, alert, oriented x3 Not in acute distress  CMP Latest Ref Rng & Units 11/11/2018 05/23/2018 10/12/2017  Glucose 65 - 99 mg/dL 99 97 85  BUN 8 - 27 mg/dL 22 21 18   Creatinine 0.76 - 1.27 mg/dL 1.19 0.94 1.07  Sodium 134 - 144 mmol/L 137 139 139  Potassium 3.5 - 5.2 mmol/L 3.5 4.1 3.7  Chloride 96 - 106 mmol/L 96 101 100  CO2 20 - 29 mmol/L 23 21 23   Calcium 8.6 - 10.2 mg/dL 10.3(H) 9.5 9.9  Total Protein 6.0 - 8.5 g/dL - 7.8 8.1  Total Bilirubin 0.0 - 1.2 mg/dL - 1.1 1.4(H)  Alkaline Phos 39 - 117 IU/L - 62 67  AST 0 -  40 IU/L - 31 28  ALT 0 - 44 IU/L - 30 22     Assessment and Plan: 1. Essential hypertension, benign Stable Counseled on blood pressure goal of less than 130/80, low-sodium, DASH diet, medication compliance, 150 minutes of moderate intensity exercise per week. Discussed medication compliance, adverse effects. - amLODipine (NORVASC) 10 MG tablet; Take 1 tablet (10 mg total) by mouth daily.  Dispense: 90 tablet; Refill: 1 - hydrochlorothiazide (HYDRODIURIL) 25 MG tablet; Take 1 tablet (25 mg total) by mouth daily.  Dispense: 90 tablet; Refill: 1 - carvedilol (COREG) 6.25 MG tablet; Take 1 tablet (6.25 mg total) by mouth 2 (two) times daily with a meal.  Dispense: 180 tablet; Refill: 1  2.  Hyperlipidemia LDL goal <100 Controlled Low-cholesterol diet - atorvastatin (LIPITOR) 20 MG tablet; TAKE 1 TABLET(20 MG) BY MOUTH DAILY  Dispense: 90 tablet; Refill: 1  3. Pain of left hand Possibly underlying osteoarthritis Placed on NSAIDs - meloxicam (MOBIC) 7.5 MG tablet; Take 1 tablet (7.5 mg total) by mouth daily.  Dispense: 30 tablet; Refill: 1   Follow Up Instructions: 3 months   I discussed the assessment and treatment plan with the patient. The patient was provided an opportunity to ask questions and all were answered. The patient agreed with the plan and demonstrated an understanding of the instructions.   The patient was advised to call back or seek an in-person evaluation if the symptoms worsen or if the condition fails to improve as anticipated.     I provided 13 minutes total of non-face-to-face time during this encounter including median intraservice time, reviewing previous notes, labs, imaging, medications, management and patient verbalized understanding.     Charlott Rakes, MD, FAAFP. [facility] [place], [place]   04/03/2019, 10:54 AM

## 2019-05-25 ENCOUNTER — Other Ambulatory Visit: Payer: Self-pay | Admitting: Family Medicine

## 2019-05-25 DIAGNOSIS — M79642 Pain in left hand: Secondary | ICD-10-CM

## 2019-06-17 IMAGING — US US ABDOMEN LIMITED
1 series · 14 of 25 positions shown · non-contrast
Comparison: Right upper quadrant ultrasound April 19, 2018

CLINICAL DATA: Right upper quadrant ultrasound April 19, 2018

EXAM:
ULTRASOUND ABDOMEN LIMITED RIGHT UPPER QUADRANT

[Series 1: us abdomen limited · 0.22mm/px · 14 of 44 slices shown]
[im 1/44]
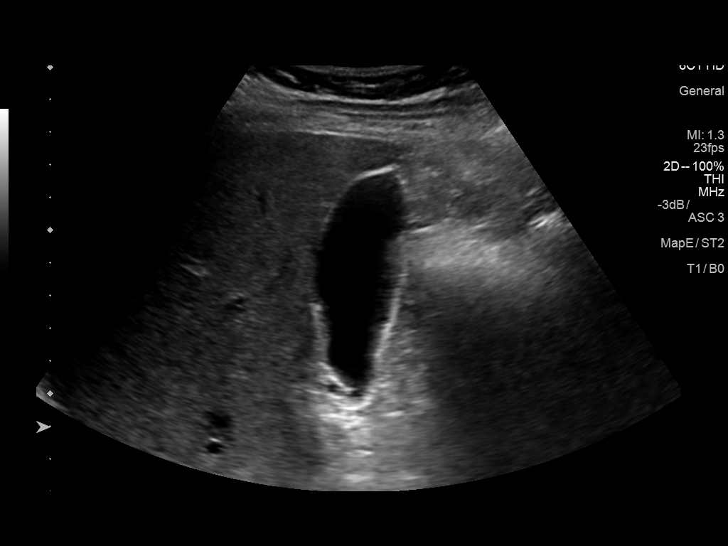
[im 4/44]
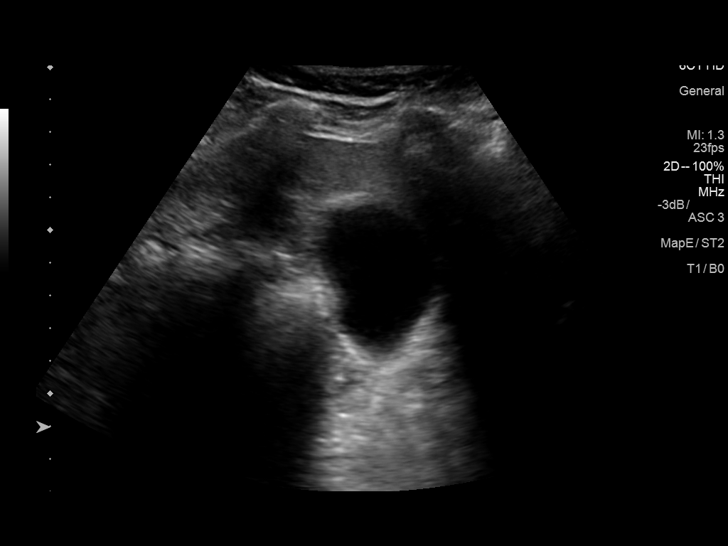
[im 8/44]
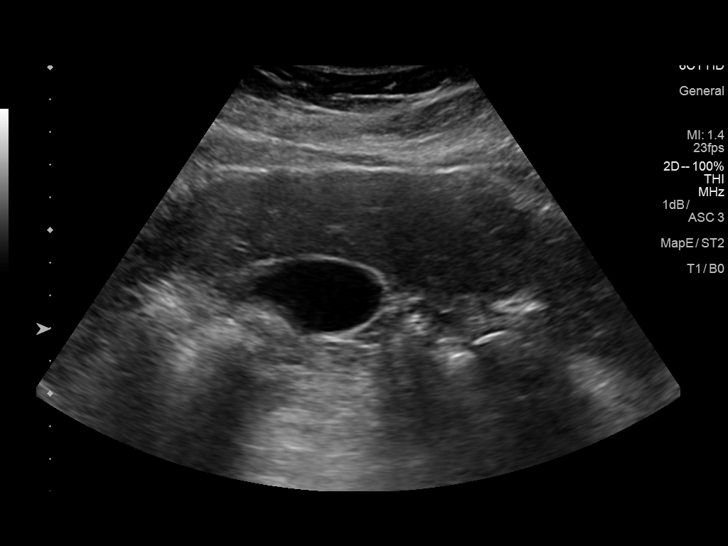
[im 11/44]
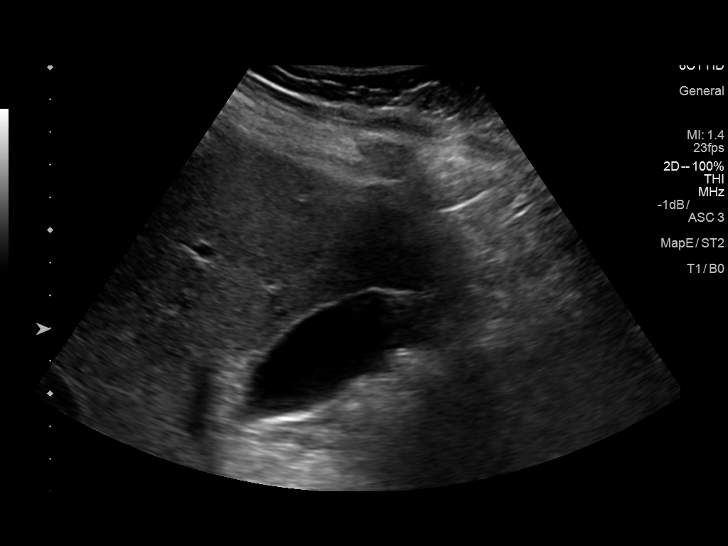
[im 15/44]
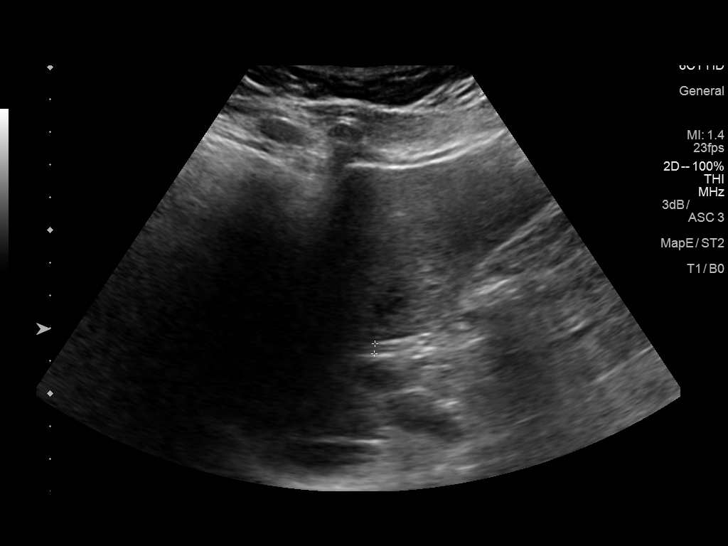
[im 17/44]
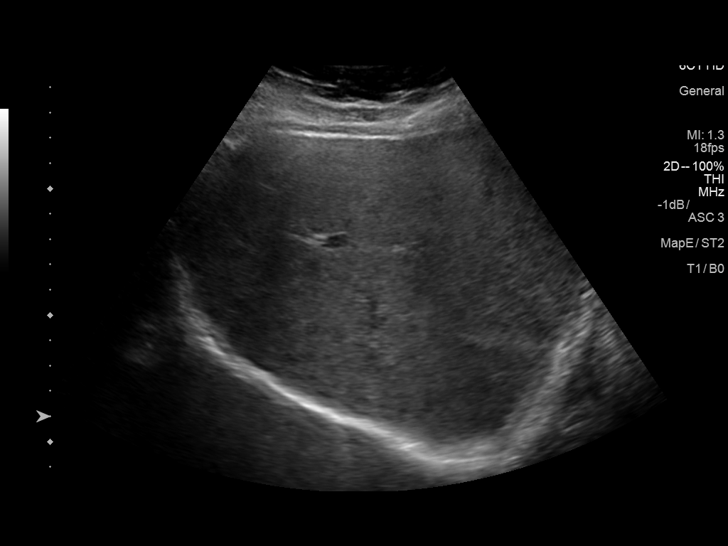
[im 20/44]
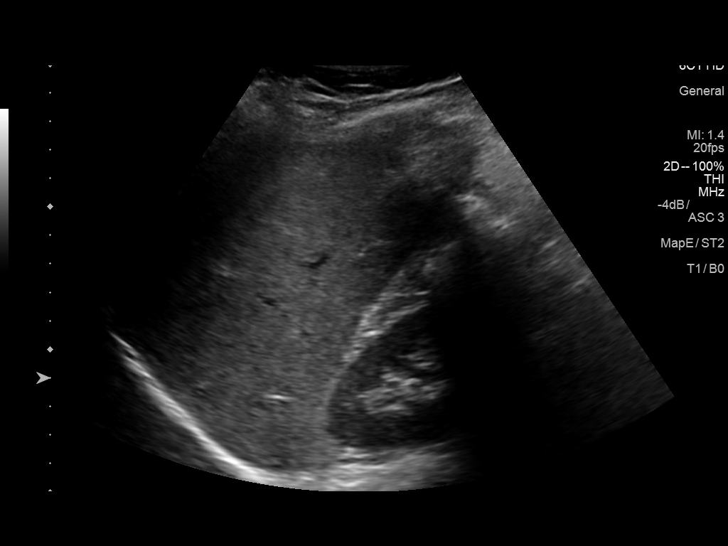
[im 24/44]
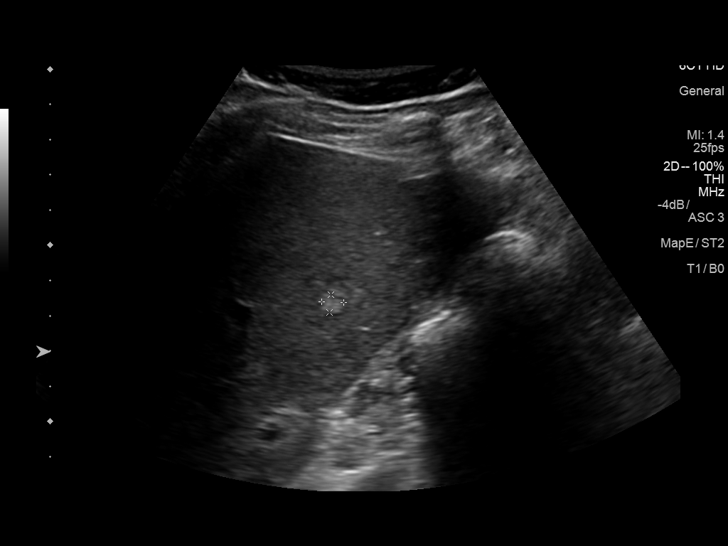
[im 27/44]
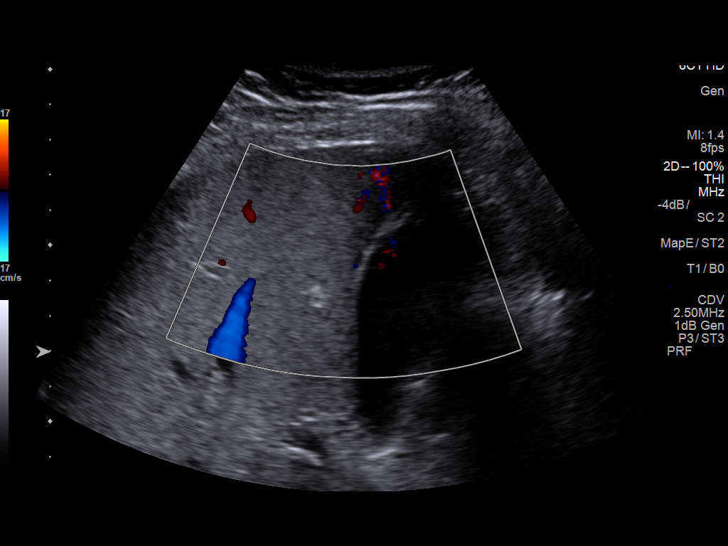
[im 29/44]
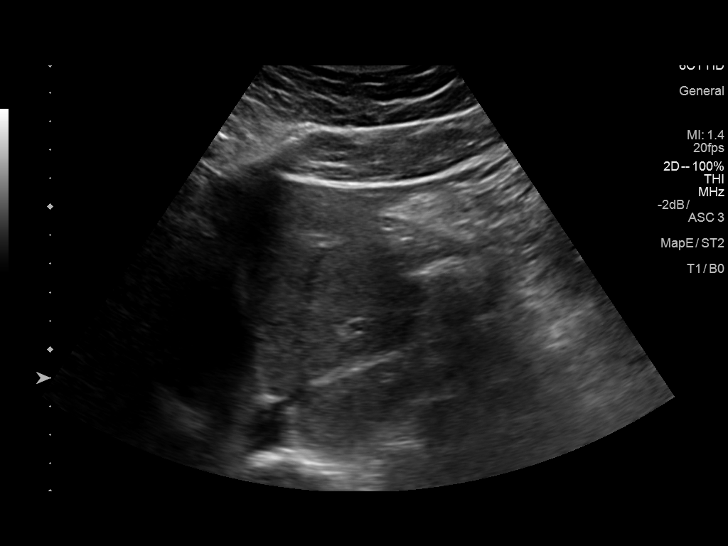
[im 33/44]
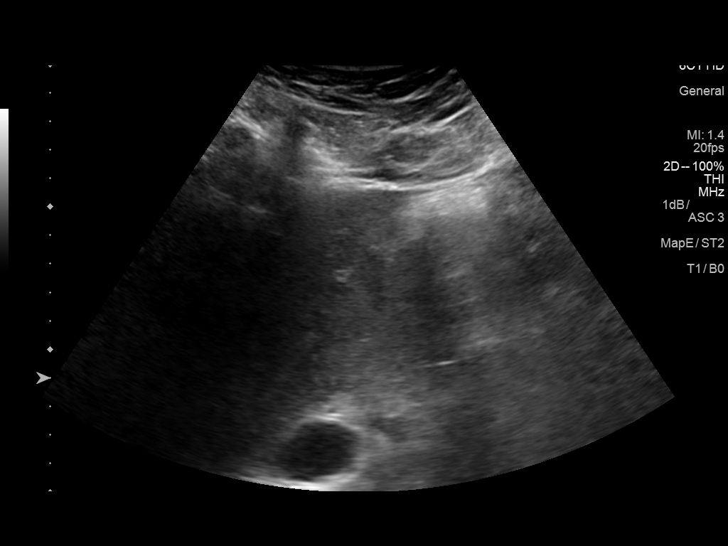
[im 36/44]
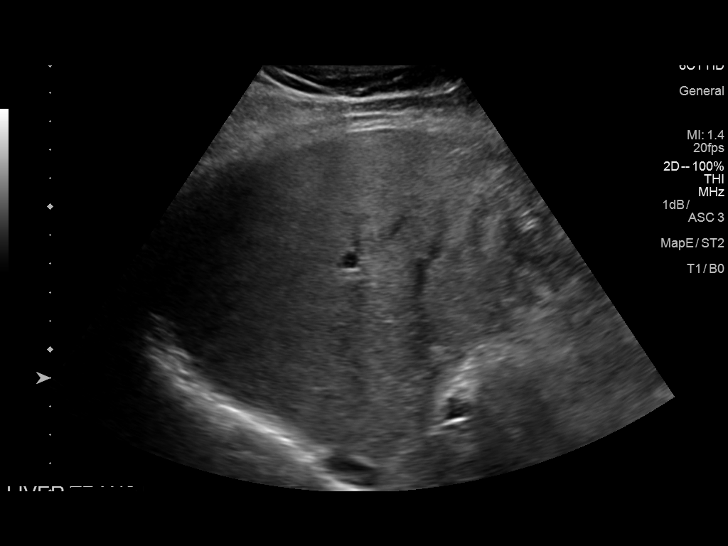
[im 40/44]
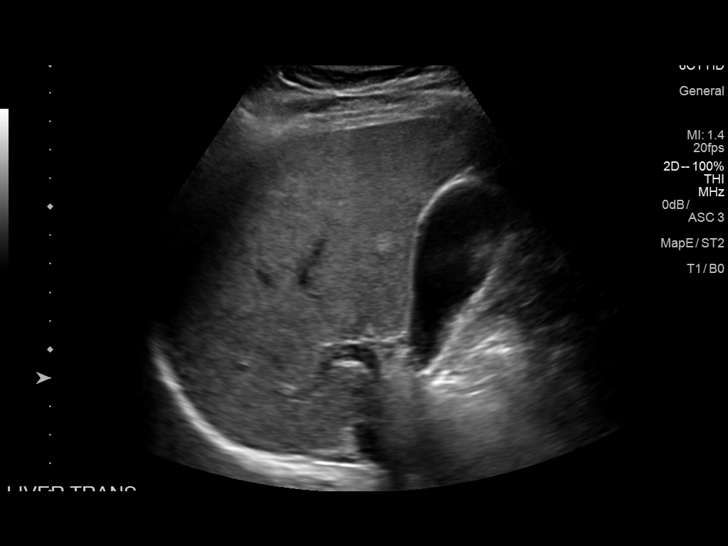
[im 44/44]
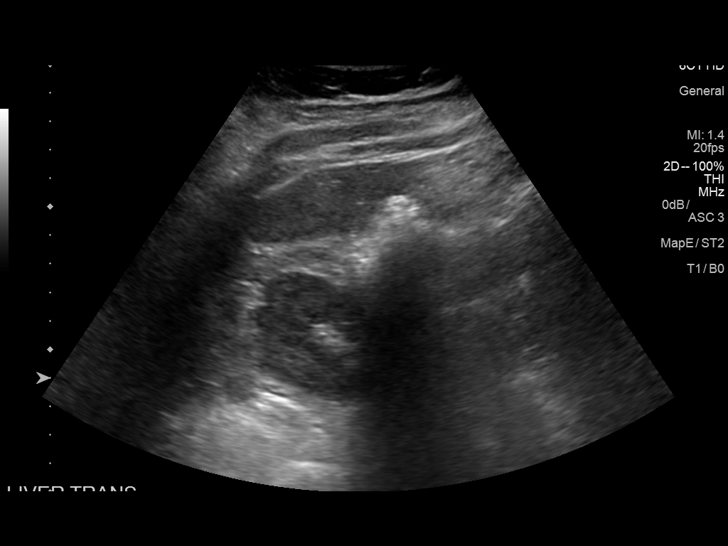

[14 of 25 positions shown; findings below may reference images not displayed]

FINDINGS: Gallbladder:

No gallstones or wall thickening visualized. No sonographic Murphy
sign noted by sonographer.

Common bile duct:

Diameter: 3.5 mm

Liver:

The hepatic echotexture is within the limits of normal. There is a
stable 6 x 5 x 6 mm hyperechoic nodule in the right lobe adjacent to
the gallbladder most compatible with a hemangioma. There is no
intrahepatic ductal dilation. The surface contour of the liver
remains smooth. Portal vein is patent on color Doppler imaging with
normal direction of blood flow towards the liver.
IMPRESSION: No acute abnormality of the liver is observed. A stable hyperechoic
focus compatible with hemangioma in the right lobe.

Normal appearance of the gallbladder and common bile duct.

## 2019-07-19 ENCOUNTER — Other Ambulatory Visit: Payer: Self-pay | Admitting: Family Medicine

## 2019-07-19 DIAGNOSIS — M79642 Pain in left hand: Secondary | ICD-10-CM

## 2019-09-13 ENCOUNTER — Other Ambulatory Visit: Payer: Self-pay | Admitting: Family Medicine

## 2019-09-13 DIAGNOSIS — M79642 Pain in left hand: Secondary | ICD-10-CM

## 2019-09-23 ENCOUNTER — Ambulatory Visit: Payer: Medicare Other | Attending: Family Medicine | Admitting: Family Medicine

## 2019-09-23 ENCOUNTER — Other Ambulatory Visit: Payer: Self-pay

## 2019-09-23 ENCOUNTER — Encounter: Payer: Self-pay | Admitting: Family Medicine

## 2019-09-23 VITALS — BP 138/78 | HR 86 | Temp 98.8°F | Resp 18 | Ht 71.0 in | Wt 228.0 lb

## 2019-09-23 DIAGNOSIS — E785 Hyperlipidemia, unspecified: Secondary | ICD-10-CM

## 2019-09-23 DIAGNOSIS — R05 Cough: Secondary | ICD-10-CM

## 2019-09-23 DIAGNOSIS — J328 Other chronic sinusitis: Secondary | ICD-10-CM

## 2019-09-23 DIAGNOSIS — I10 Essential (primary) hypertension: Secondary | ICD-10-CM

## 2019-09-23 MED ORDER — HYDROCHLOROTHIAZIDE 25 MG PO TABS: 25.0000 mg | ORAL_TABLET | Freq: Every day | ORAL | 1 refills | Status: DC

## 2019-09-23 MED ORDER — CARVEDILOL 6.25 MG PO TABS: 6.2500 mg | ORAL_TABLET | Freq: Two times a day (BID) | ORAL | 1 refills | Status: DC

## 2019-09-23 MED ORDER — CETIRIZINE HCL 10 MG PO TABS: 10.0000 mg | ORAL_TABLET | Freq: Every day | ORAL | 1 refills | Status: DC

## 2019-09-23 MED ORDER — ATORVASTATIN CALCIUM 20 MG PO TABS: ORAL_TABLET | ORAL | 1 refills | Status: DC

## 2019-09-23 MED ORDER — FLUTICASONE PROPIONATE 50 MCG/ACT NA SUSP: 2.0000 | Freq: Every day | NASAL | 6 refills | Status: DC

## 2019-09-23 MED ORDER — AMLODIPINE BESYLATE 10 MG PO TABS: 10.0000 mg | ORAL_TABLET | Freq: Every day | ORAL | 1 refills | Status: DC

## 2019-09-23 NOTE — Patient Instructions (Signed)
Postnasal Drip Postnasal drip is the feeling of mucus going down the back of your throat. Mucus is a slimy substance that moistens and cleans your nose and throat, as well as the air pockets in face bones near your forehead and cheeks (sinuses). Small amounts of mucus pass from your nose and sinuses down the back of your throat all the time. This is normal. When you produce too much mucus or the mucus gets too thick, you can feel it. Some common causes of postnasal drip include:  Having more mucus because of: ? A cold or the flu. ? Allergies. ? Cold air. ? Certain medicines.  Having more mucus that is thicker because of: ? A sinus or nasal infection. ? Dry air. ? A food allergy. Follow these instructions at home: Relieving discomfort   Gargle with a salt-water mixture 3-4 times a day or as needed. To make a salt-water mixture, completely dissolve -1 tsp of salt in 1 cup of warm water.  If the air in your home is dry, use a humidifier to add moisture to the air.  Use a saline spray or container (neti pot) to flush out the nose (nasal irrigation). These methods can help clear away mucus and keep the nasal passages moist. General instructions  Take over-the-counter and prescription medicines only as told by your health care provider.  Follow instructions from your health care provider about eating or drinking restrictions. You may need to avoid caffeine.  Avoid things that you know you are allergic to (allergens), like dust, mold, pollen, pets, or certain foods.  Drink enough fluid to keep your urine pale yellow.  Keep all follow-up visits as told by your health care provider. This is important. Contact a health care provider if:  You have a fever.  You have a sore throat.  You have difficulty swallowing.  You have headache.  You have sinus pain.  You have a cough that does not go away.  The mucus from your nose becomes thick and is green or yellow in color.  You have  cold or flu symptoms that last more than 10 days. Summary  Postnasal drip is the feeling of mucus going down the back of your throat.  If your health care provider approves, use nasal irrigation or a nasal spray 2?4 times a day.  Avoid things that you know you are allergic to (allergens), like dust, mold, pollen, pets, or certain foods. This information is not intended to replace advice given to you by your health care provider. Make sure you discuss any questions you have with your health care provider. Document Released: 01/29/2017 Document Revised: 02/07/2019 Document Reviewed: 01/29/2017 Elsevier Patient Education  2020 [employer].

## 2019-09-23 NOTE — Progress Notes (Signed)
Subjective:  Patient ID: Dustin [name], male    DOB: 1956-02-21  Age: 63 y.o. MRN: [id number]  CC: Cough   HPI Dustin [name] is a 63 year old male with a history of hypertension, hypercholesterolemia, osteoarthritis of the hands who presents today for follow-up visit. He complains of cough, associated with phlegm in his throat, wheezing and sometimes hears a rattling in his chest.  Denies chest pain, rhinorrhea, sore throat, sinus pressure. He also denies presence of a fever.  He is compliant with his antihypertensive and statin and denies adverse effects from his medications.  He exercises around his house and tries to adhere to a low-cholesterol, low-sodium diet. His osteoarthritis of the hand is stable but he does have a left middle finger trigger finger which sometimes catches in extension for which he uses a brace prescribed by his orthopedics. He has no additional concerns today.  Past Medical History:  Diagnosis Date  . Arthritis    bil knees, left hand  . Carcinoid tumor   . DVT of upper extremity (deep vein thrombosis) (HCC)    left brachial vein, 12/2010  . Helicobacter pylori gastritis   . History of blood clots    to left arm  . Hypertension   . Shortness of breath dyspnea    with exertion  . Tubular adenoma of colon 2017    Past Surgical History:  Procedure Laterality Date  . colonscopy      1 year ago   . KNEE ARTHROPLASTY Right 04/30/2015   Procedure: COMPUTER ASSISTED TOTAL KNEE ARTHROPLASTY;  Surgeon: Marybelle Killings, MD;  Location: [facility];  Service: Orthopedics;  Laterality: Right;  . Left  Hand   2011   cyst removed.  Marland Kitchen TOTAL KNEE ARTHROPLASTY  10/19/2011   Procedure: TOTAL KNEE ARTHROPLASTY;  Surgeon: Sharmon Revere;  Location: [facility];  Service: Orthopedics;  Laterality: Left;    Family History  Problem Relation Age of Onset  . Cancer Mother   . Cancer Father        prostate cancer  . Diabetes Other   . Hypertension Other   . Diabetes Maternal  Uncle   . Anesthesia problems Neg Hx   . Hypotension Neg Hx   . Malignant hyperthermia Neg Hx   . Pseudochol deficiency Neg Hx     No Known Allergies  Outpatient Medications Prior to Visit  Medication Sig Dispense Refill  . aspirin EC 325 MG tablet Take 1 tablet (325 mg total) by mouth daily. 30 tablet 0  . diclofenac sodium (VOLTAREN) 1 % GEL Apply 4 g topically 4 (four) times daily. 100 g 5  . meloxicam (MOBIC) 7.5 MG tablet TAKE 1 TABLET(7.5 MG) BY MOUTH DAILY 7 tablet 0  . vitamin E (VITAMIN E) 400 UNIT capsule Take 800 Units by mouth daily.    Marland Kitchen amLODipine (NORVASC) 10 MG tablet Take 1 tablet (10 mg total) by mouth daily. 90 tablet 1  . atorvastatin (LIPITOR) 20 MG tablet TAKE 1 TABLET(20 MG) BY MOUTH DAILY 90 tablet 1  . carvedilol (COREG) 6.25 MG tablet Take 1 tablet (6.25 mg total) by mouth 2 (two) times daily with a meal. 180 tablet 1  . hydrochlorothiazide (HYDRODIURIL) 25 MG tablet Take 1 tablet (25 mg total) by mouth daily. 90 tablet 1   No facility-administered medications prior to visit.      ROS Review of Systems  Constitutional: Negative for activity change and appetite change.  HENT: Negative for sinus pressure and sore  throat.   Eyes: Negative for visual disturbance.  Respiratory: Positive for cough and wheezing. Negative for chest tightness and shortness of breath.   Cardiovascular: Negative for chest pain and leg swelling.  Gastrointestinal: Negative for abdominal distention, abdominal pain, constipation and diarrhea.  Endocrine: Negative.   Genitourinary: Negative for dysuria.  Musculoskeletal:       See HPI  Skin: Negative for rash.  Allergic/Immunologic: Negative.   Neurological: Negative for weakness, light-headedness and numbness.  Psychiatric/Behavioral: Negative for dysphoric mood and suicidal ideas.    Objective:  BP 138/78 (BP Location: Left Arm, Patient Position: Sitting, Cuff Size: Large)   Pulse 86   Temp 98.8 F (37.1 C) (Oral)   Resp 18    Ht 5\' 11"  (1.803 m)   Wt 228 lb (103.4 kg)   SpO2 98%   BMI 31.80 kg/m   BP/Weight 09/23/2019 11/11/2018 [id number]  Systolic BP 0000000 [id number] 0000000  Diastolic BP 78 79 68  Wt. (Lbs) 228 216.6 224.2  BMI 31.8 29.38 30.41      Physical Exam Constitutional:      Appearance: He is well-developed.  Neck:     Vascular: No JVD.  Cardiovascular:     Rate and Rhythm: Normal rate.     Heart sounds: Normal heart sounds. No murmur.  Pulmonary:     Effort: Pulmonary effort is normal.     Breath sounds: Normal breath sounds. No wheezing or rales.  Chest:     Chest wall: No tenderness.  Abdominal:     General: Bowel sounds are normal. There is no distension.     Palpations: Abdomen is soft. There is no mass.     Tenderness: There is no abdominal tenderness.  Musculoskeletal: Normal range of motion.     Right lower leg: No edema.     Left lower leg: No edema.     Comments: Able to make a fist bilaterally Left middle finger catches on extension  Neurological:     Mental Status: He is alert and oriented to person, place, and time.  Psychiatric:        Mood and Affect: Mood normal.     CMP Latest Ref Rng & Units 11/11/2018 05/23/2018 10/12/2017  Glucose 65 - 99 mg/dL 99 97 85  BUN 8 - 27 mg/dL 22 21 18   Creatinine 0.76 - 1.27 mg/dL 1.19 0.94 1.07  Sodium 134 - 144 mmol/L 137 139 139  Potassium 3.5 - 5.2 mmol/L 3.5 4.1 3.7  Chloride 96 - 106 mmol/L 96 101 100  CO2 20 - 29 mmol/L 23 21 23   Calcium 8.6 - 10.2 mg/dL 10.3(H) 9.5 9.9  Total Protein 6.0 - 8.5 g/dL - 7.8 8.1  Total Bilirubin 0.0 - 1.2 mg/dL - 1.1 1.4(H)  Alkaline Phos 39 - 117 IU/L - 62 67  AST 0 - 40 IU/L - 31 28  ALT 0 - 44 IU/L - 30 22    Lipid Panel     Component Value Date/Time   CHOL 116 05/23/2018 0917   TRIG 49 05/23/2018 0917   HDL 50 05/23/2018 0917   CHOLHDL 2.3 05/23/2018 0917   CHOLHDL 3.1 04/01/2014 1226   VLDL 9 04/01/2014 1226   LDLCALC 56 05/23/2018 0917    CBC    Component Value Date/Time    WBC 4.1 03/01/2017 0914   WBC 3.9 (L) 11/18/2015 1016   RBC 4.97 03/01/2017 0914   RBC 4.88 11/18/2015 1016   HGB 15.0 03/01/2017 0914   HCT 42.3  03/01/2017 0914   PLT 229 03/01/2017 0914   MCV 85 03/01/2017 0914   MCH 30.2 03/01/2017 0914   MCH 29.3 11/18/2015 1016   MCHC 35.5 03/01/2017 0914   MCHC 34.2 11/18/2015 1016   RDW 14.2 03/01/2017 0914   LYMPHSABS 1.2 10/03/2014 1126   MONOABS 0.6 10/03/2014 1126   EOSABS 0.0 10/03/2014 1126   BASOSABS 0.0 10/03/2014 1126    Lab Results  Component Value Date   HGBA1C 5.40 11/18/2015    Assessment & Plan:   1. Essential hypertension, benign Controlled Counseled on blood pressure goal of less than 130/80, low-sodium, DASH diet, medication compliance, 150 minutes of moderate intensity exercise per week. Discussed medication compliance, adverse effects. - Complete Metabolic Panel with GFR - Lipid panel - amLODipine (NORVASC) 10 MG tablet; Take 1 tablet (10 mg total) by mouth daily.  Dispense: 90 tablet; Refill: 1 - carvedilol (COREG) 6.25 MG tablet; Take 1 tablet (6.25 mg total) by mouth 2 (two) times daily with a meal.  Dispense: 180 tablet; Refill: 1 - hydrochlorothiazide (HYDRODIURIL) 25 MG tablet; Take 1 tablet (25 mg total) by mouth daily.  Dispense: 90 tablet; Refill: 1  2. Hyperlipidemia LDL goal <100 Controlled Low-cholesterol diet - atorvastatin (LIPITOR) 20 MG tablet; TAKE 1 TABLET(20 MG) BY MOUTH DAILY  Dispense: 90 tablet; Refill: 1  3. Other chronic sinusitis Could explain his symptoms of cough Recommend antihistamine and nasal spray - fluticasone (FLONASE) 50 MCG/ACT nasal spray; Place 2 sprays into both nostrils daily.  Dispense: 16 g; Refill: 6 - cetirizine (ZYRTEC) 10 MG tablet; Take 1 tablet (10 mg total) by mouth daily.  Dispense: 30 tablet; Refill: 1    Meds ordered this encounter  Medications  . amLODipine (NORVASC) 10 MG tablet    Sig: Take 1 tablet (10 mg total) by mouth daily.    Dispense:  90  tablet    Refill:  1  . atorvastatin (LIPITOR) 20 MG tablet    Sig: TAKE 1 TABLET(20 MG) BY MOUTH DAILY    Dispense:  90 tablet    Refill:  1  . carvedilol (COREG) 6.25 MG tablet    Sig: Take 1 tablet (6.25 mg total) by mouth 2 (two) times daily with a meal.    Dispense:  180 tablet    Refill:  1  . hydrochlorothiazide (HYDRODIURIL) 25 MG tablet    Sig: Take 1 tablet (25 mg total) by mouth daily.    Dispense:  90 tablet    Refill:  1  . fluticasone (FLONASE) 50 MCG/ACT nasal spray    Sig: Place 2 sprays into both nostrils daily.    Dispense:  16 g    Refill:  6  . cetirizine (ZYRTEC) 10 MG tablet    Sig: Take 1 tablet (10 mg total) by mouth daily.    Dispense:  30 tablet    Refill:  1    Follow-up: Return in about 6 months (around 03/22/2020) for medical conditions.       Charlott Rakes, MD, FAAFP. [facility] [place], [place]   09/23/2019, 2:55 PM

## 2019-09-24 LAB — LIPID PANEL
Chol/HDL Ratio: 2.5 ratio (ref 0.0–5.0)
Cholesterol, Total: 128 mg/dL (ref 100–199)
HDL: 51 mg/dL (ref >39)
LDL Chol Calc (NIH): 63 mg/dL (ref 0–99)
Triglycerides: 66 mg/dL (ref 0–149)
VLDL Cholesterol Cal: 14 mg/dL (ref 5–40)

## 2019-09-24 LAB — CMP14+EGFR
ALT: 26 IU/L (ref 0–44)
AST: 32 IU/L (ref 0–40)
Albumin/Globulin Ratio: 1.6 (ref 1.2–2.2)
Albumin: 5.1 g/dL — ABNORMAL HIGH (ref 3.8–4.8)
Alkaline Phosphatase: 71 IU/L (ref 39–117)
BUN/Creatinine Ratio: 19 (ref 10–24)
BUN: 19 mg/dL (ref 8–27)
Bilirubin Total: 1.5 mg/dL — ABNORMAL HIGH (ref 0.0–1.2)
CO2: 23 mmol/L (ref 20–29)
Calcium: 10.3 mg/dL — ABNORMAL HIGH (ref 8.6–10.2)
Chloride: 101 mmol/L (ref 96–106)
Creatinine, Ser: 0.99 mg/dL (ref 0.76–1.27)
GFR calc Af Amer: 93 mL/min/{1.73_m2} (ref >59)
GFR calc non Af Amer: 81 mL/min/{1.73_m2} (ref >59)
Globulin, Total: 3.2 g/dL (ref 1.5–4.5)
Glucose: 96 mg/dL (ref 65–99)
Potassium: 3.3 mmol/L — ABNORMAL LOW (ref 3.5–5.2)
Sodium: 140 mmol/L (ref 134–144)
Total Protein: 8.3 g/dL (ref 6.0–8.5)

## 2019-11-12 ENCOUNTER — Other Ambulatory Visit: Payer: Self-pay | Admitting: Family Medicine

## 2019-11-12 DIAGNOSIS — J328 Other chronic sinusitis: Secondary | ICD-10-CM

## 2019-11-24 ENCOUNTER — Other Ambulatory Visit: Payer: Self-pay | Admitting: Family Medicine

## 2019-11-24 DIAGNOSIS — M65332 Trigger finger, left middle finger: Secondary | ICD-10-CM

## 2020-01-09 ENCOUNTER — Ambulatory Visit: Payer: Medicare Other | Attending: Internal Medicine

## 2020-01-09 DIAGNOSIS — Z23 Encounter for immunization: Secondary | ICD-10-CM

## 2020-01-09 NOTE — Progress Notes (Signed)
   Covid-19 Vaccination Clinic  Name:  Dustin [name]    MRN: [id number] DOB: [birth year]  01/09/2020  [name] was observed post Covid-19 immunization for 15 minutes without incident. He was provided with Vaccine Information Sheet and instruction to access the V-Safe system.   [name] was instructed to call 911 with any severe reactions post vaccine: Marland Kitchen Difficulty breathing  . Swelling of face and throat  . A fast heartbeat  . A bad rash all over body  . Dizziness and weakness   Immunizations Administered    Name Date Dose VIS Date Route   Pfizer COVID-19 Vaccine 01/09/2020  9:42 AM 0.3 mL 10/10/2019 Intramuscular   Manufacturer: [place]   Lot: [id number]   [place]: [id number]

## 2020-02-02 ENCOUNTER — Ambulatory Visit: Payer: Medicare Other | Attending: Internal Medicine

## 2020-02-02 DIAGNOSIS — Z23 Encounter for immunization: Secondary | ICD-10-CM

## 2020-02-02 NOTE — Progress Notes (Signed)
   Covid-19 Vaccination Clinic  Name:  Dustin [name]    MRN: [id number] DOB: 12-Feb-1956  02/02/2020  [name] was observed post Covid-19 immunization for 15 minutes without incident. He was provided with Vaccine Information Sheet and instruction to access the V-Safe system.   [name] was instructed to call 911 with any severe reactions post vaccine: Marland Kitchen Difficulty breathing  . Swelling of face and throat  . A fast heartbeat  . A bad rash all over body  . Dizziness and weakness   Immunizations Administered    Name Date Dose VIS Date Route   Pfizer COVID-19 Vaccine 02/02/2020 11:31 AM 0.3 mL 10/10/2019 Intramuscular   Manufacturer: [place]   Lot: [id number]   [place]: [id number]

## 2020-03-23 ENCOUNTER — Telehealth: Payer: Self-pay | Admitting: Family Medicine

## 2020-03-23 ENCOUNTER — Other Ambulatory Visit: Payer: Self-pay | Admitting: Family Medicine

## 2020-03-23 DIAGNOSIS — E785 Hyperlipidemia, unspecified: Secondary | ICD-10-CM

## 2020-03-23 DIAGNOSIS — I10 Essential (primary) hypertension: Secondary | ICD-10-CM

## 2020-03-23 NOTE — Telephone Encounter (Signed)
Patient needs an appointment

## 2020-03-23 NOTE — Telephone Encounter (Signed)
[facility] [phone] - [place], [place] - [address]  [place], [place]  [id number] atorvastatin (LIPITOR) 20 MG tablet [id number]  carvedilol (COREG) 6.25 MG tablet [id number]

## 2020-04-07 ENCOUNTER — Other Ambulatory Visit: Payer: Self-pay | Admitting: Family Medicine

## 2020-04-07 DIAGNOSIS — J328 Other chronic sinusitis: Secondary | ICD-10-CM

## 2020-04-09 ENCOUNTER — Other Ambulatory Visit: Payer: Self-pay | Admitting: Family Medicine

## 2020-04-09 DIAGNOSIS — J328 Other chronic sinusitis: Secondary | ICD-10-CM

## 2020-04-17 ENCOUNTER — Other Ambulatory Visit: Payer: Self-pay | Admitting: Family Medicine

## 2020-04-17 DIAGNOSIS — I10 Essential (primary) hypertension: Secondary | ICD-10-CM

## 2020-04-20 ENCOUNTER — Other Ambulatory Visit: Payer: Self-pay | Admitting: Family Medicine

## 2020-04-20 DIAGNOSIS — I10 Essential (primary) hypertension: Secondary | ICD-10-CM

## 2020-05-04 ENCOUNTER — Other Ambulatory Visit: Payer: Self-pay | Admitting: Family Medicine

## 2020-05-04 DIAGNOSIS — J328 Other chronic sinusitis: Secondary | ICD-10-CM

## 2020-05-20 ENCOUNTER — Other Ambulatory Visit: Payer: Self-pay | Admitting: Family Medicine

## 2020-05-20 DIAGNOSIS — I10 Essential (primary) hypertension: Secondary | ICD-10-CM

## 2020-05-20 NOTE — Telephone Encounter (Signed)
Requested medication (s) are due for refill today: yes  Requested medication (s) are on the active medication list: yes  Last refill:  12/24/2019  Future visit scheduled: no  Notes to clinic:  patient has not scheduled follow up    Requested Prescriptions  Pending Prescriptions Disp Refills   hydrochlorothiazide (HYDRODIURIL) 25 MG tablet [Pharmacy Med Name: HYDROCHLOROTHIAZIDE 25MG  TABLETS] 90 tablet 1    Sig: TAKE 1 TABLET(25 MG) BY MOUTH DAILY      Cardiovascular: Diuretics - Thiazide Failed - 05/20/2020 11:27 AM      Failed - Ca in normal range and within 360 days    Calcium  Date Value Ref Range Status  09/23/2019 10.3 (H) 8.6 - 10.2 mg/dL Final          Failed - K in normal range and within 360 days    Potassium  Date Value Ref Range Status  09/23/2019 3.3 (L) 3.5 - 5.2 mmol/L Final          Failed - Valid encounter within last 6 months    Recent Outpatient Visits           8 months ago Other chronic sinusitis   [place], Charlane Ferretti, MD   1 year ago Pain of left hand   [place], Enobong, MD   1 year ago Essential hypertension, benign   [place], Enobong, MD   1 year ago Essential hypertension, benign   [facility] Charlott Rakes, MD   2 years ago Need for influenza vaccination   [place], [place], MD              Passed - Cr in normal range and within 360 days    Creat  Date Value Ref Range Status  03/15/2017 0.98 0.70 - 1.25 mg/dL Final    Comment:      For patients > or = 64 years of age: The upper reference limit for Creatinine is approximately 13% higher for people identified as African-American.      Creatinine, Ser  Date Value Ref Range Status  09/23/2019 0.99 0.76 - 1.27 mg/dL Final          Passed - Na in normal range and within 360 days    Sodium   Date Value Ref Range Status  09/23/2019 140 134 - 144 mmol/L Final          Passed - Last BP in normal range    BP Readings from Last 1 Encounters:  09/23/19 138/78

## 2020-05-21 ENCOUNTER — Telehealth: Payer: Self-pay | Admitting: Family Medicine

## 2020-05-21 DIAGNOSIS — I10 Essential (primary) hypertension: Secondary | ICD-10-CM

## 2020-05-21 NOTE — Telephone Encounter (Signed)
Copied from [place] [id number]. Topic: Quick Communication - Rx Refill/Question >> May 21, 2020 12:57 PM Dustin [name] wrote: Pt called and scheduled the soonest available AM appt. Wants refill of - hydroCHLOROthiazide 25 MG TAKE 1 TABLET(25 MG) BY MOUTH DAILY - sent in to last him until then. He would also like to be contacted for sooner appt.

## 2020-05-25 ENCOUNTER — Other Ambulatory Visit: Payer: Self-pay | Admitting: Family Medicine

## 2020-05-25 DIAGNOSIS — I10 Essential (primary) hypertension: Secondary | ICD-10-CM

## 2020-05-25 MED ORDER — HYDROCHLOROTHIAZIDE 25 MG PO TABS: 25.0000 mg | ORAL_TABLET | Freq: Every day | ORAL | 0 refills | Status: DC

## 2020-05-25 NOTE — Telephone Encounter (Signed)
Rx sent 

## 2020-05-25 NOTE — Telephone Encounter (Signed)
Can you refill

## 2020-05-29 ENCOUNTER — Other Ambulatory Visit: Payer: Self-pay | Admitting: Family Medicine

## 2020-05-29 DIAGNOSIS — I10 Essential (primary) hypertension: Secondary | ICD-10-CM

## 2020-05-29 NOTE — Telephone Encounter (Signed)
Courtesy RF previously given - refilled with enough med to last until upcoming appt Requested Prescriptions  Pending Prescriptions Disp Refills  . carvedilol (COREG) 6.25 MG tablet [Pharmacy Med Name: CARVEDILOL 6.25MG  TABLETS] 26 tablet 0    Sig: TAKE 1 TABLET(6.25 MG) BY MOUTH TWICE DAILY WITH A MEAL     Cardiovascular:  Beta Blockers Failed - 05/29/2020  5:38 PM      Failed - Valid encounter within last 6 months    Recent Outpatient Visits          8 months ago Other chronic sinusitis   [place], Enobong, MD   1 year ago Pain of left hand   [place], Enobong, MD   1 year ago Essential hypertension, benign   [place] Charlott Rakes, MD   2 years ago Essential hypertension, benign   [place] Charlott Rakes, MD   2 years ago Need for influenza vaccination   [place], Enobong, MD      Future Appointments            In 1 week [place], Dionne Bucy, PA-C [place]   In 1 month [place], [place], MD [place] BP in normal range    BP Readings from Last 1 Encounters:  09/23/19 138/78         Passed - Last Heart Rate in normal range    Pulse Readings from Last 1 Encounters:  09/23/19 86

## 2020-05-30 ENCOUNTER — Other Ambulatory Visit: Payer: Self-pay | Admitting: Family Medicine

## 2020-05-30 DIAGNOSIS — J328 Other chronic sinusitis: Secondary | ICD-10-CM

## 2020-05-30 NOTE — Telephone Encounter (Signed)
Requested Prescriptions  Pending Prescriptions Disp Refills  . fluticasone (FLONASE) 50 MCG/ACT nasal spray [Pharmacy Med Name: FLUTICASONE 50MCG NASAL SP (120) RX] 16 g 0    Sig: PLACE 2 SPRAYS IN BOTH NOSTRILS     Ear, Nose, and Throat: Nasal Preparations - Corticosteroids Passed - 05/30/2020 10:28 AM      Passed - Valid encounter within last 12 months    Recent Outpatient Visits          8 months ago Other chronic sinusitis   [place], Charlane Ferretti, MD   1 year ago Pain of left hand   [place], Enobong, MD   1 year ago Essential hypertension, benign   [facility] Charlott Rakes, MD   2 years ago Essential hypertension, benign   [place] Charlott Rakes, MD   2 years ago Need for influenza vaccination   [place], Enobong, MD      Future Appointments            In 1 week [place], Dionne Bucy, PA-C [place]   In 1 month Charlott Rakes, MD [place]

## 2020-06-09 ENCOUNTER — Ambulatory Visit: Payer: Medicare Other | Admitting: Physician Assistant

## 2020-06-10 ENCOUNTER — Other Ambulatory Visit: Payer: Self-pay | Admitting: Physician Assistant

## 2020-06-10 ENCOUNTER — Ambulatory Visit: Payer: Medicare Other | Attending: Physician Assistant | Admitting: Physician Assistant

## 2020-06-10 ENCOUNTER — Encounter: Payer: Self-pay | Admitting: Physician Assistant

## 2020-06-10 ENCOUNTER — Other Ambulatory Visit: Payer: Self-pay

## 2020-06-10 DIAGNOSIS — I10 Essential (primary) hypertension: Secondary | ICD-10-CM | POA: Diagnosis not present

## 2020-06-10 DIAGNOSIS — E785 Hyperlipidemia, unspecified: Secondary | ICD-10-CM

## 2020-06-10 DIAGNOSIS — J328 Other chronic sinusitis: Secondary | ICD-10-CM | POA: Diagnosis not present

## 2020-06-10 DIAGNOSIS — M65332 Trigger finger, left middle finger: Secondary | ICD-10-CM | POA: Diagnosis not present

## 2020-06-10 DIAGNOSIS — M79642 Pain in left hand: Secondary | ICD-10-CM

## 2020-06-10 MED ORDER — CARVEDILOL 6.25 MG PO TABS: ORAL_TABLET | ORAL | 0 refills | Status: DC

## 2020-06-10 MED ORDER — ATORVASTATIN CALCIUM 20 MG PO TABS: ORAL_TABLET | ORAL | 1 refills | Status: DC

## 2020-06-10 MED ORDER — FLUTICASONE PROPIONATE 50 MCG/ACT NA SUSP: NASAL | 0 refills | Status: DC

## 2020-06-10 MED ORDER — AMLODIPINE BESYLATE 10 MG PO TABS: 10.0000 mg | ORAL_TABLET | Freq: Every day | ORAL | 0 refills | Status: DC

## 2020-06-10 MED ORDER — HYDROCHLOROTHIAZIDE 25 MG PO TABS: 25.0000 mg | ORAL_TABLET | Freq: Every day | ORAL | 0 refills | Status: DC

## 2020-06-10 MED ORDER — MELOXICAM 7.5 MG PO TABS: ORAL_TABLET | ORAL | 0 refills | Status: DC

## 2020-06-10 MED ORDER — DICLOFENAC SODIUM 1 % EX GEL: CUTANEOUS | 2 refills | Status: DC

## 2020-06-10 MED ORDER — CETIRIZINE HCL 10 MG PO TABS: ORAL_TABLET | ORAL | 1 refills | Status: DC

## 2020-06-10 NOTE — Telephone Encounter (Signed)
Notes to clinic:  Patient requesting 90 day supply   Requested Prescriptions  Pending Prescriptions Disp Refills   carvedilol (COREG) 6.25 MG tablet [Pharmacy Med Name: CARVEDILOL 6.25MG  TABLETS] 180 tablet     Sig: TAKE 1 TABLET(6.25 MG) BY MOUTH TWICE DAILY WITH A MEAL      Cardiovascular:  Beta Blockers Passed - 06/10/2020  9:00 AM      Passed - Last BP in normal range    BP Readings from Last 1 Encounters:  09/23/19 138/78          Passed - Last Heart Rate in normal range    Pulse Readings from Last 1 Encounters:  09/23/19 86          Passed - Valid encounter within last 6 months    Recent Outpatient Visits           Today Essential hypertension, benign   [place], [place], [place]   8 months ago Other chronic sinusitis   [place], [place], MD   1 year ago Pain of left hand   [place], [place], MD   1 year ago Essential hypertension, benign   [place], Charlane Ferretti, MD   2 years ago Essential hypertension, benign   [place], Charlane Ferretti, MD       Future Appointments             In 4 weeks Charlott Rakes, MD [place]              atorvastatin (LIPITOR) 20 MG tablet [Pharmacy Med Name: ATORVASTATIN 20MG  TABLETS] 90 tablet     Sig: TAKE 1 TABLET(20 MG) BY MOUTH DAILY      Cardiovascular:  Antilipid - Statins Failed - 06/10/2020  9:00 AM      Failed - LDL in normal range and within 360 days    LDL Chol Calc (NIH)  Date Value Ref Range Status  09/23/2019 63 0 - 99 mg/dL Final          Passed - Total Cholesterol in normal range and within 360 days    Cholesterol, Total  Date Value Ref Range Status  09/23/2019 128 100 - 199 mg/dL Final          Passed - HDL in normal range and within 360 days    HDL  Date Value Ref Range  Status  09/23/2019 51 >39 mg/dL Final          Passed - Triglycerides in normal range and within 360 days    Triglycerides  Date Value Ref Range Status  09/23/2019 66 0 - 149 mg/dL Final          Passed - Patient is not pregnant      Passed - Valid encounter within last 12 months    Recent Outpatient Visits           Today Essential hypertension, benign   [place], [place], [place]   8 months ago Other chronic sinusitis   [place], Charlane Ferretti, MD   1 year ago Pain of left hand   [place], Enobong, MD   1 year ago Essential hypertension, benign   [facility] Charlott Rakes, MD   2  years ago Essential hypertension, benign   [place], Charlane Ferretti, MD       Future Appointments             In 4 weeks Charlott Rakes, MD [place]              amLODipine (NORVASC) 10 MG tablet [Pharmacy Med Name: AMLODIPINE BESYLATE 10MG  TABLETS] 90 tablet     Sig: TAKE 1 TABLET(10 MG) BY MOUTH DAILY      Cardiovascular:  Calcium Channel Blockers Passed - 06/10/2020  9:00 AM      Passed - Last BP in normal range    BP Readings from Last 1 Encounters:  09/23/19 138/78          Passed - Valid encounter within last 6 months    Recent Outpatient Visits           Today Essential hypertension, benign   [place], [place], [place]   8 months ago Other chronic sinusitis   [place], Charlane Ferretti, MD   1 year ago Pain of left hand   [place], Enobong, MD   1 year ago Essential hypertension, benign   [place], Enobong, MD   2 years ago Essential hypertension, benign   [place], Enobong,  MD       Future Appointments             In 4 weeks Charlott Rakes, MD [place]

## 2020-06-10 NOTE — Progress Notes (Signed)
Patient ID: Dustin [name], male   DOB: 1956-01-08, 64 y.o.   MRN: [id number]   Virtual Visit via Telephone Note  I connected with Doristine Mango on 06/10/20 at  9:10 AM EDT by telephone and verified that I am speaking with the correct person using two identifiers.   I discussed the limitations, risks, security and privacy concerns of performing an evaluation and management service by telephone and the availability of in person appointments. I also discussed with the patient that there may be a patient responsible charge related to this service. The patient expressed understanding and agreed to proceed.  PATIENT visit by telephone virtually in the context of Covid-19 pandemic. Patient location:  home My Location:  [place] office Persons on the call:  Me and the patient  History of Present Illness:  Patient needs RF on meds.  He has an appt with his PCP 07/08/2020 and verbalizes awareness of this.  He denies any HA/CP/Dizziness.  He is compliant with meds.  Not checking BP out of the office.    Most recent BP in epic 09/23/2019=138/78     Observations/Objective:  NAD.  A&Ox3   Assessment and Plan: 1. Essential hypertension, benign - hydrochlorothiazide (HYDRODIURIL) 25 MG tablet; Take 1 tablet (25 mg total) by mouth daily.  Dispense: 30 tablet; Refill: 0 - carvedilol (COREG) 6.25 MG tablet; TAKE 1 TABLET(6.25 MG) BY MOUTH TWICE DAILY WITH A MEAL  Dispense: 60 tablet; Refill: 0 - amLODipine (NORVASC) 10 MG tablet; Take 1 tablet (10 mg total) by mouth daily.  Dispense: 30 tablet; Refill: 0  2. Hyperlipidemia LDL goal <100 - atorvastatin (LIPITOR) 20 MG tablet; TAKE 1 TABLET(20 MG) BY MOUTH DAILY  Dispense: 30 tablet; Refill: 1  3. Pain of left hand - meloxicam (MOBIC) 7.5 MG tablet; TAKE 1 TABLET(7.5 MG) BY MOUTH DAILY  Dispense: 30 tablet; Refill: 0  4. Other chronic sinusitis - fluticasone (FLONASE) 50 MCG/ACT nasal spray; PLACE 2 SPRAYS IN BOTH NOSTRILS  Dispense: 16 g; Refill: 0 -  cetirizine (ZYRTEC) 10 MG tablet; TAKE 1 TABLET(10 MG) BY MOUTH DAILY  Dispense: 30 tablet; Refill: 1  5. Trigger middle finger of left hand - diclofenac Sodium (VOLTAREN) 1 % GEL; APPLY 4 GRAMS EXTERNALLY TO THE AFFECTED AREA FOUR TIMES DAILY  Dispense: 100 g; Refill: 2    Follow Up Instructions: Come fasting for next appt with PCP 07/08/2020 and keep appt.  He verbalizes understanding   I discussed the assessment and treatment plan with the patient. The patient was provided an opportunity to ask questions and all were answered. The patient agreed with the plan and demonstrated an understanding of the instructions.   The patient was advised to call back or seek an in-person evaluation if the symptoms worsen or if the condition fails to improve as anticipated.  I provided 7 minutes of non-face-to-face time during this encounter.   Freeman Caldron, PA-C

## 2020-06-12 ENCOUNTER — Other Ambulatory Visit: Payer: Self-pay | Admitting: Family Medicine

## 2020-06-12 DIAGNOSIS — I10 Essential (primary) hypertension: Secondary | ICD-10-CM

## 2020-06-17 ENCOUNTER — Other Ambulatory Visit: Payer: Self-pay | Admitting: Physician Assistant

## 2020-06-17 DIAGNOSIS — I10 Essential (primary) hypertension: Secondary | ICD-10-CM

## 2020-06-17 NOTE — Telephone Encounter (Signed)
Requested medication (s) are due for refill today: yes  Requested medication (s) are on the active medication list: yes  Last refill: 06/10/20  #30  0 refills  Future visit scheduled: yes in 3 weeks  Notes to clinic:  Patient had tele visit with Freeman Caldron 06/10/20. She gave 30 day. Patient needs labs checked.    Requested Prescriptions  Pending Prescriptions Disp Refills   hydrochlorothiazide (HYDRODIURIL) 25 MG tablet [Pharmacy Med Name: HYDROCHLOROTHIAZIDE 25MG  TABLETS] 90 tablet     Sig: TAKE 1 TABLET(25 MG) BY MOUTH DAILY      Cardiovascular: Diuretics - Thiazide Failed - 06/17/2020  8:53 AM      Failed - Ca in normal range and within 360 days    Calcium  Date Value Ref Range Status  09/23/2019 10.3 (H) 8.6 - 10.2 mg/dL Final          Failed - K in normal range and within 360 days    Potassium  Date Value Ref Range Status  09/23/2019 3.3 (L) 3.5 - 5.2 mmol/L Final          Passed - Cr in normal range and within 360 days    Creat  Date Value Ref Range Status  03/15/2017 0.98 0.70 - 1.25 mg/dL Final    Comment:      For patients > or = 64 years of age: The upper reference limit for Creatinine is approximately 13% higher for people identified as African-American.      Creatinine, Ser  Date Value Ref Range Status  09/23/2019 0.99 0.76 - 1.27 mg/dL Final          Passed - Na in normal range and within 360 days    Sodium  Date Value Ref Range Status  09/23/2019 140 134 - 144 mmol/L Final          Passed - Last BP in normal range    BP Readings from Last 1 Encounters:  09/23/19 138/78          Passed - Valid encounter within last 6 months    Recent Outpatient Visits           1 week ago Essential hypertension, benign   Dustin [name], [place], [place]   8 months ago Other chronic sinusitis   [place], Charlane Ferretti, MD   1 year ago Pain of left hand   [place], Enobong, MD   1 year ago Essential hypertension, benign   [place], Enobong, MD   2 years ago Essential hypertension, benign   [place], Enobong, MD       Future Appointments             In 3 weeks Charlott Rakes, MD [place]

## 2020-06-22 ENCOUNTER — Other Ambulatory Visit: Payer: Self-pay | Admitting: Physician Assistant

## 2020-06-22 DIAGNOSIS — J328 Other chronic sinusitis: Secondary | ICD-10-CM

## 2020-06-22 NOTE — Telephone Encounter (Signed)
   Notes to clinic:  Patient requests 90 days supply   Requested Prescriptions  Pending Prescriptions Disp Refills   fluticasone (FLONASE) 50 MCG/ACT nasal spray [Pharmacy Med Name: FLUTICASONE 50MCG NASAL SP (120) RX] 48 g     Sig: PLACE 2 SPRAYS IN EACH NOSTRIL DAILY      Ear, Nose, and Throat: Nasal Preparations - Corticosteroids Passed - 06/22/2020 10:19 AM      Passed - Valid encounter within last 12 months    Recent Outpatient Visits           1 week ago Essential hypertension, benign   [place], [place], [place]   9 months ago Other chronic sinusitis   [place], Charlane Ferretti, MD   1 year ago Pain of left hand   [place], Enobong, MD   1 year ago Essential hypertension, benign   [place], Enobong, MD   2 years ago Essential hypertension, benign   [place], Enobong, MD       Future Appointments             In 2 weeks Charlott Rakes, MD [place]

## 2020-07-04 ENCOUNTER — Emergency Department (HOSPITAL_COMMUNITY): Payer: Medicare Other

## 2020-07-04 ENCOUNTER — Emergency Department (HOSPITAL_COMMUNITY)
Admission: EM | Admit: 2020-07-04 | Discharge: 2020-07-04 | Disposition: A | Discharge status: Home / Self Care | Payer: Medicare Other | Attending: Emergency Medicine | Admitting: Emergency Medicine

## 2020-07-04 ENCOUNTER — Encounter (HOSPITAL_COMMUNITY): Payer: Self-pay

## 2020-07-04 ENCOUNTER — Other Ambulatory Visit: Payer: Self-pay

## 2020-07-04 DIAGNOSIS — J40 Bronchitis, not specified as acute or chronic: Secondary | ICD-10-CM

## 2020-07-04 DIAGNOSIS — Z7982 Long term (current) use of aspirin: Secondary | ICD-10-CM | POA: Insufficient documentation

## 2020-07-04 DIAGNOSIS — J209 Acute bronchitis, unspecified: Secondary | ICD-10-CM | POA: Diagnosis not present

## 2020-07-04 DIAGNOSIS — R05 Cough: Secondary | ICD-10-CM | POA: Diagnosis not present

## 2020-07-04 DIAGNOSIS — Z79899 Other long term (current) drug therapy: Secondary | ICD-10-CM | POA: Diagnosis not present

## 2020-07-04 DIAGNOSIS — I10 Essential (primary) hypertension: Secondary | ICD-10-CM | POA: Insufficient documentation

## 2020-07-04 MED ORDER — AEROCHAMBER PLUS FLO-VU LARGE MISC
Status: AC
  Administered 2020-07-04: 1
  Filled 2020-07-04: qty 1

## 2020-07-04 MED ORDER — AEROCHAMBER PLUS FLO-VU LARGE MISC: 1.0000 | Freq: Once | Status: AC

## 2020-07-04 MED ORDER — ALBUTEROL SULFATE HFA 108 (90 BASE) MCG/ACT IN AERS
2.0000 | INHALATION_SPRAY | Freq: Once | RESPIRATORY_TRACT | Status: AC
  Administered 2020-07-04: 2 via RESPIRATORY_TRACT
  Filled 2020-07-04: qty 6.7

## 2020-07-04 MED ORDER — METHYLPREDNISOLONE 4 MG PO TBPK: ORAL_TABLET | ORAL | 0 refills | Status: DC

## 2020-07-04 NOTE — ED Triage Notes (Signed)
Patient complains of cough that is worse at night for 6 weeks. Has used otc meds with minimal relief. No other associated symptoms. nonsmoker

## 2020-07-04 NOTE — ED Provider Notes (Signed)
[facility] EMERGENCY DEPARTMENT Provider Note   CSN: [id number] Arrival date & time: 07/04/20  0840     History No chief complaint on file.   Dustin [name] is a 64 y.o. male.  HPI   This patient is a very pleasant 64 year old male with a history of hypertension on amlodipine, Coreg and hydrochlorothiazide.  Has known hyperlipidemia but does not smoke cigarettes.  He does have a history of a DVT in the past, as well as a history of a carcinoid tumor.  He presents to the hospital today with a complaint of a cough which he states is nonproductive and seems to be worse at night.  The patient has no history significant for allergies, he does do a lot of work outside.  He denies tobacco use   Past Medical History:  Diagnosis Date  . Arthritis    bil knees, left hand  . Carcinoid tumor   . DVT of upper extremity (deep vein thrombosis) (HCC)    left brachial vein, 12/2010  . Helicobacter pylori gastritis   . History of blood clots    to left arm  . Hypertension   . Shortness of breath dyspnea    with exertion  . Tubular adenoma of colon 2017    Patient Active Problem List   Diagnosis Date Noted  . Hyperlipidemia LDL goal <100 03/02/2017  . 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 Hepatic cirrhosis ([place]) 04/27/2016  . Hep C w/o coma, chronic ([place]) 11/22/2015  . Pain due to total right knee replacement ([place]) 05/15/2015  . History of DVT (deep vein thrombosis) 05/09/2015  . 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 Left ventricular hypertrophy by electrocardiogram 04/07/2015  . Essential hypertension, benign 04/01/2014  . Osteoarthritis 04/01/2014    Past Surgical History:  Procedure Laterality Date  . colonscopy      1 year ago   . KNEE ARTHROPLASTY Right 04/30/2015   Procedure: COMPUTER ASSISTED TOTAL KNEE ARTHROPLASTY;  Surgeon: Marybelle Killings, MD;  Location: [facility];  Service: Orthopedics;  Laterality: Right;  . Left  Hand    2011   cyst removed.  Marland Kitchen TOTAL KNEE ARTHROPLASTY  10/19/2011   Procedure: TOTAL KNEE ARTHROPLASTY;  Surgeon: Sharmon Revere;  Location: [facility];  Service: Orthopedics;  Laterality: Left;       Family History  Problem Relation Age of Onset  . Cancer Mother   . Cancer Father        prostate cancer  . Diabetes Other   . Hypertension Other   . Diabetes Maternal Uncle   . Anesthesia problems Neg Hx   . Hypotension Neg Hx   . Malignant hyperthermia Neg Hx   . Pseudochol deficiency Neg Hx     Social History   Tobacco Use  . Smoking status: Never Smoker  . Smokeless tobacco: Never Used  Substance Use Topics  . Alcohol use: No    Alcohol/week: 0.0 standard drinks  . Drug use: No    Home Medications Prior to Admission medications   Medication Sig Start Date End Date Taking? Authorizing Provider  amLODipine (NORVASC) 10 MG tablet Take 1 tablet (10 mg total) by mouth daily. 06/10/20   Argentina Donovan, PA-C  ASPIRIN 81 PO Take by mouth.    [provider]  aspirin EC 325 MG tablet Take 1 tablet (325 mg total) by mouth daily. Patient not taking: Reported on 06/10/2020 04/30/15   Lanae Crumbly, PA-C  atorvastatin (LIPITOR) 20 MG tablet TAKE 1 TABLET(20 MG) BY MOUTH DAILY 06/10/20   Freeman Caldron M, PA-C  carvedilol (COREG) 6.25 MG tablet TAKE 1 TABLET(6.25 MG) BY MOUTH TWICE DAILY WITH A MEAL 06/10/20   McClung, Levada Dy M, PA-C  cetirizine (ZYRTEC) 10 MG tablet TAKE 1 TABLET(10 MG) BY MOUTH DAILY 06/10/20   Argentina Donovan, PA-C  diclofenac Sodium (VOLTAREN) 1 % GEL APPLY 4 GRAMS EXTERNALLY TO THE AFFECTED AREA FOUR TIMES DAILY 06/10/20   Freeman Caldron M, PA-C  fluticasone (FLONASE) 50 MCG/ACT nasal spray PLACE 2 SPRAYS IN EACH NOSTRIL DAILY 06/22/20   Charlott Rakes, MD  hydrochlorothiazide (HYDRODIURIL) 25 MG tablet Take 1 tablet (25 mg total) by mouth daily. 06/10/20   Argentina Donovan, PA-C  meloxicam (MOBIC) 7.5 MG tablet TAKE 1 TABLET(7.5 MG) BY MOUTH DAILY 06/10/20    Argentina Donovan, PA-C  methylPREDNISolone (MEDROL DOSEPAK) 4 MG TBPK tablet Taper PO, over 6 days please 07/04/20   Noemi Chapel, MD  vitamin E (VITAMIN E) 400 UNIT capsule Take 800 Units by mouth daily.    [provider]    Allergies    Patient has no known allergies.  Review of Systems   Review of Systems  All other systems reviewed and are negative.   Physical Exam Updated Vital Signs BP (!) 151/86   Pulse 68   Temp (!) 97.5 F (36.4 C) (Oral)   Resp 16   SpO2 97%   Physical Exam Vitals and nursing note reviewed.  Constitutional:      General: He is not in acute distress.    Appearance: He is well-developed.  HENT:     Head: Normocephalic and atraumatic.     Mouth/Throat:     Pharynx: No oropharyngeal exudate.  Eyes:     General: No scleral icterus.       Right eye: No discharge.        Left eye: No discharge.     Conjunctiva/sclera: Conjunctivae normal.     Pupils: Pupils are equal, round, and reactive to light.  Neck:     Thyroid: No thyromegaly.     Vascular: No JVD.  Cardiovascular:     Rate and Rhythm: Normal rate and regular rhythm.     Heart sounds: Normal heart sounds. No murmur heard.  No friction rub. No gallop.   Pulmonary:     Effort: Pulmonary effort is normal. No respiratory distress.     Breath sounds: Wheezing present. No rales.  Abdominal:     General: Bowel sounds are normal. There is no distension.     Palpations: Abdomen is soft. There is no mass.     Tenderness: There is no abdominal tenderness.  Musculoskeletal:        General: No tenderness. Normal range of motion.     Cervical back: Normal range of motion and neck supple.  Lymphadenopathy:     Cervical: No cervical adenopathy.  Skin:    General: Skin is warm and dry.     Findings: No erythema or rash.  Neurological:     Mental Status: He is alert.     Coordination: Coordination normal.  Psychiatric:        Behavior: Behavior normal.     ED Results / Procedures /  Treatments   Labs (all labs ordered are listed, but only abnormal results are displayed) Labs Reviewed - No data to display  EKG None  Radiology DG Chest 2 View  Result Date: 07/04/2020 CLINICAL  DATA:  Dry cough for the past month. EXAM: CHEST - 2 VIEW COMPARISON:  10/03/2014 FINDINGS: Normal sized heart. Clear lungs. Stable mild diffuse peribronchial thickening. Unremarkable bones. IMPRESSION: Stable mild chronic bronchitic changes. Electronically Signed   By: Claudie Revering M.D.   On: 07/04/2020 09:44    Procedures Procedures (including critical care time)  Medications Ordered in ED Medications  albuterol (VENTOLIN HFA) 108 (90 Base) MCG/ACT inhaler 2 puff (has no administration in time range)  AeroChamber Plus Flo-Vu Medium MISC 1 each (has no administration in time range)    ED Course  I have reviewed the triage vital signs and the nursing notes.  Pertinent labs & imaging results that were available during my care of the patient were reviewed by me and considered in my medical decision making (see chart for details).    MDM Rules/Calculators/A&P                          Ongoing coughing, dry, vital signs are totally unremarkable except for mild hypertension at 151/86.  I have personally viewed the chest x-ray, there is no infiltrates effusions pneumothorax or abnormal mediastinum.  There is no subdiaphragmatic air and the skin and soft tissues appear unremarkable.  Occasional wheeze on inspiration and expiration, normal respiratory rate oxygen saturation temperature and heart rate.  No edema, not on any medications that would cause a cough, very stable for discharge with a likely noninfectious bronchitis.  Albuterol given here with a spacer for home, Medrol Dosepak, patient agreeable, can follow-up outpatient  Final Clinical Impression(s) / ED Diagnoses Final diagnoses:  Bronchitis    Rx / DC Orders ED Discharge Orders         Ordered    methylPREDNISolone (MEDROL  DOSEPAK) 4 MG TBPK tablet        07/04/20 1425           Noemi Chapel, MD 07/04/20 1426

## 2020-07-04 NOTE — ED Notes (Signed)
Patient verbalizes understanding of discharge instructions. Opportunity for questioning and answers were provided. Armband removed by staff, pt discharged from ED to home via POV  

## 2020-07-04 NOTE — Discharge Instructions (Signed)
Your x-ray was unremarkable and showed no signs of pneumonia or Covid Your treatment today will be the following medications  Albuterol, 2 puffs every 4 hours as needed for shortness of breath wheezing or coughing Medrol Dosepak, take this daily over the next 6 days  See your doctor in the next week if you are no better but be aware that coughing may last for weeks with these kind of illnesses.  If you are still coughing without improvement after 1 week or if you have still been coughing for a month even if you are improving you will need to see your doctor at the end of that time.

## 2020-07-05 ENCOUNTER — Other Ambulatory Visit: Payer: Self-pay | Admitting: Physician Assistant

## 2020-07-05 DIAGNOSIS — M79642 Pain in left hand: Secondary | ICD-10-CM

## 2020-07-08 ENCOUNTER — Encounter: Payer: Self-pay | Admitting: Family Medicine

## 2020-07-08 ENCOUNTER — Ambulatory Visit: Payer: Medicare Other | Attending: Family Medicine | Admitting: Family Medicine

## 2020-07-08 ENCOUNTER — Other Ambulatory Visit: Payer: Self-pay

## 2020-07-08 VITALS — BP 138/69 | HR 71 | Ht 71.0 in | Wt 225.8 lb

## 2020-07-08 DIAGNOSIS — Z23 Encounter for immunization: Secondary | ICD-10-CM | POA: Diagnosis not present

## 2020-07-08 DIAGNOSIS — E785 Hyperlipidemia, unspecified: Secondary | ICD-10-CM | POA: Diagnosis not present

## 2020-07-08 DIAGNOSIS — J328 Other chronic sinusitis: Secondary | ICD-10-CM

## 2020-07-08 DIAGNOSIS — I10 Essential (primary) hypertension: Secondary | ICD-10-CM | POA: Diagnosis not present

## 2020-07-08 MED ORDER — ALBUTEROL SULFATE HFA 108 (90 BASE) MCG/ACT IN AERS: 2.0000 | INHALATION_SPRAY | Freq: Four times a day (QID) | RESPIRATORY_TRACT | 1 refills | Status: DC | PRN

## 2020-07-08 MED ORDER — ATORVASTATIN CALCIUM 20 MG PO TABS: ORAL_TABLET | ORAL | 6 refills | Status: DC

## 2020-07-08 MED ORDER — CARVEDILOL 6.25 MG PO TABS: ORAL_TABLET | ORAL | 6 refills | Status: DC

## 2020-07-08 MED ORDER — FLUTICASONE PROPIONATE 50 MCG/ACT NA SUSP: NASAL | 6 refills | Status: DC

## 2020-07-08 MED ORDER — AMLODIPINE BESYLATE 10 MG PO TABS: 10.0000 mg | ORAL_TABLET | Freq: Every day | ORAL | 6 refills | Status: DC

## 2020-07-08 MED ORDER — HYDROCHLOROTHIAZIDE 25 MG PO TABS: 25.0000 mg | ORAL_TABLET | Freq: Every day | ORAL | 6 refills | Status: DC

## 2020-07-08 NOTE — Progress Notes (Signed)
Subjective:  Patient ID: Dustin [name], male    DOB: 1956-02-16  Age: 64 y.o. MRN: [id number]  CC: Hypertension   HPI Dustin [name] is a 63 year old male with a history of hypertension, hypercholesterolemia, osteoarthritis of the hands who presents today for follow-up visit  Treated for Bronchitis after an ED visit on 07/04/2020 and his cough has improved with the use of his MDI. Denies dyspnea, hoarseness and cough has been non productive.  With regards to his hypertension he has been compliant with his medications and is doing well on his statin.  He has no adverse effects from his medications.  On inquiring about his meloxicam use he does not recall needing meloxicam lately and denies presence of pain in his joints  Past Medical History:  Diagnosis Date  . Arthritis    bil knees, left hand  . Carcinoid tumor   . DVT of upper extremity (deep vein thrombosis) (HCC)    left brachial vein, 12/2010  . Helicobacter pylori gastritis   . History of blood clots    to left arm  . Hypertension   . Shortness of breath dyspnea    with exertion  . Tubular adenoma of colon 2017    Past Surgical History:  Procedure Laterality Date  . colonscopy      1 year ago   . KNEE ARTHROPLASTY Right 04/30/2015   Procedure: COMPUTER ASSISTED TOTAL KNEE ARTHROPLASTY;  Surgeon: Marybelle Killings, MD;  Location: [facility];  Service: Orthopedics;  Laterality: Right;  . Left  Hand   2011   cyst removed.  Marland Kitchen TOTAL KNEE ARTHROPLASTY  10/19/2011   Procedure: TOTAL KNEE ARTHROPLASTY;  Surgeon: Sharmon Revere;  Location: [facility];  Service: Orthopedics;  Laterality: Left;    Family History  Problem Relation Age of Onset  . Cancer Mother   . Cancer Father        prostate cancer  . Diabetes Other   . Hypertension Other   . Diabetes Maternal Uncle   . Anesthesia problems Neg Hx   . Hypotension Neg Hx   . Malignant hyperthermia Neg Hx   . Pseudochol deficiency Neg Hx     No Known Allergies  Outpatient  Medications Prior to Visit  Medication Sig Dispense Refill  . ASPIRIN 81 PO Take by mouth.    . cetirizine (ZYRTEC) 10 MG tablet TAKE 1 TABLET(10 MG) BY MOUTH DAILY 30 tablet 1  . diclofenac Sodium (VOLTAREN) 1 % GEL APPLY 4 GRAMS EXTERNALLY TO THE AFFECTED AREA FOUR TIMES DAILY 100 g 2  . meloxicam (MOBIC) 7.5 MG tablet TAKE 1 TABLET(7.5 MG) BY MOUTH DAILY 30 tablet 0  . vitamin E (VITAMIN E) 400 UNIT capsule Take 800 Units by mouth daily.    Marland Kitchen amLODipine (NORVASC) 10 MG tablet Take 1 tablet (10 mg total) by mouth daily. 30 tablet 0  . atorvastatin (LIPITOR) 20 MG tablet TAKE 1 TABLET(20 MG) BY MOUTH DAILY 30 tablet 1  . carvedilol (COREG) 6.25 MG tablet TAKE 1 TABLET(6.25 MG) BY MOUTH TWICE DAILY WITH A MEAL 60 tablet 0  . fluticasone (FLONASE) 50 MCG/ACT nasal spray PLACE 2 SPRAYS IN EACH NOSTRIL DAILY 48 g 0  . hydrochlorothiazide (HYDRODIURIL) 25 MG tablet Take 1 tablet (25 mg total) by mouth daily. 30 tablet 0  . aspirin EC 325 MG tablet Take 1 tablet (325 mg total) by mouth daily. (Patient not taking: Reported on 06/10/2020) 30 tablet 0  . methylPREDNISolone (MEDROL DOSEPAK) 4 MG  TBPK tablet Taper PO, over 6 days please (Patient not taking: Reported on 07/08/2020) 21 tablet 0   No facility-administered medications prior to visit.     ROS Review of Systems  Constitutional: Negative for activity change and appetite change.  HENT: Negative for sinus pressure and sore throat.   Eyes: Negative for visual disturbance.  Respiratory: Negative for cough, chest tightness and shortness of breath.   Cardiovascular: Negative for chest pain and leg swelling.  Gastrointestinal: Negative for abdominal distention, abdominal pain, constipation and diarrhea.  Endocrine: Negative.   Genitourinary: Negative for dysuria.  Musculoskeletal: Negative for joint swelling and myalgias.  Skin: Negative for rash.  Allergic/Immunologic: Negative.   Neurological: Negative for weakness, light-headedness and  numbness.  Psychiatric/Behavioral: Negative for dysphoric mood and suicidal ideas.    Objective:  BP 138/69   Pulse 71   Ht 5' 11"  (1.803 m)   Wt 225 lb 12.8 oz (102.4 kg)   SpO2 98%   BMI 31.49 kg/m   BP/Weight 07/08/2020 07/04/2020 25/00/3704  Systolic BP 888 916 945  Diastolic BP 69 86 78  Wt. (Lbs) 225.8 - 228  BMI 31.49 - 31.8      Physical Exam Constitutional:      Appearance: He is well-developed.  Neck:     Vascular: No JVD.  Cardiovascular:     Rate and Rhythm: Normal rate.     Heart sounds: Normal heart sounds. No murmur heard.   Pulmonary:     Effort: Pulmonary effort is normal.     Breath sounds: Normal breath sounds. No wheezing or rales.  Chest:     Chest wall: No tenderness.  Abdominal:     General: Bowel sounds are normal. There is no distension.     Palpations: Abdomen is soft. There is no mass.     Tenderness: There is no abdominal tenderness.  Musculoskeletal:        General: Normal range of motion.     Right lower leg: No edema.     Left lower leg: No edema.  Neurological:     Mental Status: He is alert and oriented to person, place, and time.  Psychiatric:        Mood and Affect: Mood normal.     CMP Latest Ref Rng & Units 09/23/2019 11/11/2018 05/23/2018  Glucose 65 - 99 mg/dL 96 99 97  BUN 8 - 27 mg/dL 19 22 21   Creatinine 0.76 - 1.27 mg/dL 0.99 1.19 0.94  Sodium 134 - 144 mmol/L 140 137 139  Potassium 3.5 - 5.2 mmol/L 3.3(L) 3.5 4.1  Chloride 96 - 106 mmol/L 101 96 101  CO2 20 - 29 mmol/L 23 23 21   Calcium 8.6 - 10.2 mg/dL 10.3(H) 10.3(H) 9.5  Total Protein 6.0 - 8.5 g/dL 8.3 - 7.8  Total Bilirubin 0.0 - 1.2 mg/dL 1.5(H) - 1.1  Alkaline Phos 39 - 117 IU/L 71 - 62  AST 0 - 40 IU/L 32 - 31  ALT 0 - 44 IU/L 26 - 30    Lipid Panel     Component Value Date/Time   CHOL 128 09/23/2019 1512   TRIG 66 09/23/2019 1512   HDL 51 09/23/2019 1512   CHOLHDL 2.5 09/23/2019 1512   CHOLHDL 3.1 04/01/2014 1226   VLDL 9 04/01/2014 1226    LDLCALC 63 09/23/2019 1512    CBC    Component Value Date/Time   WBC 4.1 03/01/2017 0914   WBC 3.9 (L) 11/18/2015 1016   RBC 4.97 03/01/2017  0914   RBC 4.88 11/18/2015 1016   HGB 15.0 03/01/2017 0914   HCT 42.3 03/01/2017 0914   PLT 229 03/01/2017 0914   MCV 85 03/01/2017 0914   MCH 30.2 03/01/2017 0914   MCH 29.3 11/18/2015 1016   MCHC 35.5 03/01/2017 0914   MCHC 34.2 11/18/2015 1016   RDW 14.2 03/01/2017 0914   LYMPHSABS 1.2 10/03/2014 1126   MONOABS 0.6 10/03/2014 1126   EOSABS 0.0 10/03/2014 1126   BASOSABS 0.0 10/03/2014 1126    Lab Results  Component Value Date   HGBA1C 5.40 11/18/2015    Assessment & Plan:  1. Essential hypertension, benign Controlled Continue current regimen Counseled on blood pressure goal of less than 130/80, low-sodium, DASH diet, medication compliance, 150 minutes of moderate intensity exercise per week. Discussed medication compliance, adverse effects. - CMP14+EGFR - Lipid panel - amLODipine (NORVASC) 10 MG tablet; Take 1 tablet (10 mg total) by mouth daily.  Dispense: 30 tablet; Refill: 6 - carvedilol (COREG) 6.25 MG tablet; TAKE 1 TABLET(6.25 MG) BY MOUTH TWICE DAILY WITH A MEAL  Dispense: 60 tablet; Refill: 6 - hydrochlorothiazide (HYDRODIURIL) 25 MG tablet; Take 1 tablet (25 mg total) by mouth daily.  Dispense: 30 tablet; Refill: 6  2. Hyperlipidemia LDL goal <100 Controlled Low-cholesterol diet - atorvastatin (LIPITOR) 20 MG tablet; TAKE 1 TABLET(20 MG) BY MOUTH DAILY  Dispense: 30 tablet; Refill: 6  3. Other chronic sinusitis Stable Prescribed albuterol MDI for recent bronchitis - fluticasone (FLONASE) 50 MCG/ACT nasal spray; PLACE 2 SPRAYS IN EACH NOSTRIL DAILY  Dispense: 48 g; Refill: 6    Meds ordered this encounter  Medications  . amLODipine (NORVASC) 10 MG tablet    Sig: Take 1 tablet (10 mg total) by mouth daily.    Dispense:  30 tablet    Refill:  6  . atorvastatin (LIPITOR) 20 MG tablet    Sig: TAKE 1 TABLET(20  MG) BY MOUTH DAILY    Dispense:  30 tablet    Refill:  6  . carvedilol (COREG) 6.25 MG tablet    Sig: TAKE 1 TABLET(6.25 MG) BY MOUTH TWICE DAILY WITH A MEAL    Dispense:  60 tablet    Refill:  6  . hydrochlorothiazide (HYDRODIURIL) 25 MG tablet    Sig: Take 1 tablet (25 mg total) by mouth daily.    Dispense:  30 tablet    Refill:  6  . fluticasone (FLONASE) 50 MCG/ACT nasal spray    Sig: PLACE 2 SPRAYS IN EACH NOSTRIL DAILY    Dispense:  48 g    Refill:  6    **Patient requests 90 days supply**  . albuterol (VENTOLIN HFA) 108 (90 Base) MCG/ACT inhaler    Sig: Inhale 2 puffs into the lungs every 6 (six) hours as needed for wheezing or shortness of breath.    Dispense:  8 g    Refill:  1    Follow-up: Return in about 6 months (around 01/05/2021) for chronic disease management.       Charlott Rakes, MD, FAAFP. [facility] and [place], [place]   07/08/2020, 9:00 AM

## 2020-07-08 NOTE — Progress Notes (Signed)
Wants to get a refill on inhaler and other medications.

## 2020-07-09 ENCOUNTER — Telehealth: Payer: Self-pay

## 2020-07-09 ENCOUNTER — Other Ambulatory Visit: Payer: Self-pay | Admitting: Physician Assistant

## 2020-07-09 DIAGNOSIS — I10 Essential (primary) hypertension: Secondary | ICD-10-CM

## 2020-07-09 LAB — CMP14+EGFR
ALT: 30 IU/L (ref 0–44)
AST: 24 IU/L (ref 0–40)
Albumin/Globulin Ratio: 1.4 (ref 1.2–2.2)
Albumin: 4.9 g/dL — ABNORMAL HIGH (ref 3.8–4.8)
Alkaline Phosphatase: 79 IU/L (ref 48–121)
BUN/Creatinine Ratio: 17 (ref 10–24)
BUN: 15 mg/dL (ref 8–27)
Bilirubin Total: 0.8 mg/dL (ref 0.0–1.2)
CO2: 25 mmol/L (ref 20–29)
Calcium: 9.9 mg/dL (ref 8.6–10.2)
Chloride: 98 mmol/L (ref 96–106)
Creatinine, Ser: 0.87 mg/dL (ref 0.76–1.27)
GFR calc Af Amer: 105 mL/min/{1.73_m2} (ref >59)
GFR calc non Af Amer: 91 mL/min/{1.73_m2} (ref >59)
Globulin, Total: 3.5 g/dL (ref 1.5–4.5)
Glucose: 102 mg/dL — ABNORMAL HIGH (ref 65–99)
Potassium: 3.7 mmol/L (ref 3.5–5.2)
Sodium: 138 mmol/L (ref 134–144)
Total Protein: 8.4 g/dL (ref 6.0–8.5)

## 2020-07-09 LAB — LIPID PANEL
Chol/HDL Ratio: 2.3 ratio (ref 0.0–5.0)
Cholesterol, Total: 122 mg/dL (ref 100–199)
HDL: 52 mg/dL (ref >39)
LDL Chol Calc (NIH): 60 mg/dL (ref 0–99)
Triglycerides: 37 mg/dL (ref 0–149)
VLDL Cholesterol Cal: 10 mg/dL (ref 5–40)

## 2020-07-09 NOTE — Telephone Encounter (Signed)
Patient name and DOB has been verified Patient was informed of lab results. Patient had no questions.  

## 2020-07-09 NOTE — Telephone Encounter (Signed)
-----   Message from Charlott Rakes, MD sent at 07/09/2020  8:46 AM EDT ----- Please inform the patient that labs are normal. Thank you.

## 2020-08-01 ENCOUNTER — Other Ambulatory Visit: Payer: Self-pay | Admitting: Physician Assistant

## 2020-08-01 DIAGNOSIS — M79642 Pain in left hand: Secondary | ICD-10-CM

## 2020-08-01 NOTE — Telephone Encounter (Signed)
Requested Prescriptions  Pending Prescriptions Disp Refills  . meloxicam (MOBIC) 7.5 MG tablet [Pharmacy Med Name: MELOXICAM 7.5MG  TABLETS] 30 tablet 0    Sig: TAKE 1 TABLET(7.5 MG) BY MOUTH DAILY     Analgesics:  COX2 Inhibitors Failed - 08/01/2020  1:05 PM      Failed - HGB in normal range and within 360 days    Hemoglobin  Date Value Ref Range Status  03/01/2017 15.0 13.0 - 17.7 g/dL Final         Passed - Cr in normal range and within 360 days    Creat  Date Value Ref Range Status  03/15/2017 0.98 0.70 - 1.25 mg/dL Final    Comment:      For patients > or = 64 years of age: The upper reference limit for Creatinine is approximately 13% higher for people identified as African-American.      Creatinine, Ser  Date Value Ref Range Status  07/08/2020 0.87 0.76 - 1.27 mg/dL Final         Passed - Patient is not pregnant      Passed - Valid encounter within last 12 months    Recent Outpatient Visits          3 weeks ago Need for immunization against influenza   [place], Enobong, MD   1 month ago Essential hypertension, benign   [place], [place], [place]   10 months ago Other chronic sinusitis   [place], Charlane Ferretti, MD   1 year ago Pain of left hand   [place], Enobong, MD   1 year ago Essential hypertension, benign   [place], Enobong, MD      Future Appointments            In 5 months Charlott Rakes, MD [place]

## 2020-08-05 ENCOUNTER — Other Ambulatory Visit: Payer: Self-pay | Admitting: Physician Assistant

## 2020-08-05 DIAGNOSIS — J328 Other chronic sinusitis: Secondary | ICD-10-CM

## 2020-08-18 ENCOUNTER — Other Ambulatory Visit: Payer: Self-pay | Admitting: Family Medicine

## 2020-08-18 NOTE — Telephone Encounter (Signed)
Requested Prescriptions  Pending Prescriptions Disp Refills  . albuterol (VENTOLIN HFA) 108 (90 Base) MCG/ACT inhaler [Pharmacy Med Name: ALBUTEROL HFA INH (200 PUFFS)6.7GM] 6.7 g 1    Sig: INHALE 2 PUFFS INTO THE LUNGS EVERY 6 HOURS AS NEEDED FOR WHEEZING OR SHORTNESS OF BREATH     Pulmonology:  Beta Agonists Failed - 08/18/2020  3:09 AM      Failed - One inhaler should last at least one month. If the patient is requesting refills earlier, contact the patient to check for uncontrolled symptoms.      Passed - Valid encounter within last 12 months    Recent Outpatient Visits          1 month ago Need for immunization against influenza   [place], Enobong, MD   2 months ago Essential hypertension, benign   [facility] [employer] [facility] [employer], [place], [place]   11 months ago Other chronic sinusitis   [place], Charlane Ferretti, MD   1 year ago Pain of left hand   [place], Enobong, MD   1 year ago Essential hypertension, benign   [place], Enobong, MD      Future Appointments            In 4 months Charlott Rakes, MD [place]

## 2020-08-27 ENCOUNTER — Other Ambulatory Visit: Payer: Self-pay | Admitting: Family Medicine

## 2020-08-27 DIAGNOSIS — M79642 Pain in left hand: Secondary | ICD-10-CM

## 2020-09-29 ENCOUNTER — Other Ambulatory Visit: Payer: Self-pay | Admitting: Family Medicine

## 2020-09-29 DIAGNOSIS — J328 Other chronic sinusitis: Secondary | ICD-10-CM

## 2020-10-02 ENCOUNTER — Other Ambulatory Visit: Payer: Self-pay | Admitting: Family Medicine

## 2020-10-02 NOTE — Telephone Encounter (Signed)
  Notes to clinic Pt asking for inhaler early.

## 2020-10-05 ENCOUNTER — Other Ambulatory Visit: Payer: Self-pay | Admitting: Family Medicine

## 2020-10-05 DIAGNOSIS — I10 Essential (primary) hypertension: Secondary | ICD-10-CM

## 2020-12-11 ENCOUNTER — Other Ambulatory Visit: Payer: Self-pay | Admitting: Family Medicine

## 2020-12-13 ENCOUNTER — Other Ambulatory Visit: Payer: Self-pay | Admitting: Family Medicine

## 2020-12-13 DIAGNOSIS — J328 Other chronic sinusitis: Secondary | ICD-10-CM

## 2020-12-13 DIAGNOSIS — M79642 Pain in left hand: Secondary | ICD-10-CM

## 2020-12-13 NOTE — Telephone Encounter (Signed)
Requested Prescriptions  Pending Prescriptions Disp Refills  . meloxicam (MOBIC) 7.5 MG tablet [Pharmacy Med Name: MELOXICAM 7.5MG  TABLETS] 30 tablet 3    Sig: TAKE 1 TABLET(7.5 MG) BY MOUTH DAILY     Analgesics:  COX2 Inhibitors Failed - 12/13/2020 10:08 AM      Failed - HGB in normal range and within 360 days    Hemoglobin  Date Value Ref Range Status  03/01/2017 15.0 13.0 - 17.7 g/dL Final         Passed - Cr in normal range and within 360 days    Creat  Date Value Ref Range Status  03/15/2017 0.98 0.70 - 1.25 mg/dL Final    Comment:      For patients > or = 65 years of age: The upper reference limit for Creatinine is approximately 13% higher for people identified as African-American.      Creatinine, Ser  Date Value Ref Range Status  07/08/2020 0.87 0.76 - 1.27 mg/dL Final         Passed - Patient is not pregnant      Passed - Valid encounter within last 12 months    Recent Outpatient Visits          5 months ago Need for immunization against influenza   [place], Enobong, MD   6 months ago Essential hypertension, benign   [place], [place], [place]   1 year ago Other chronic sinusitis   [place], Enobong, MD   1 year ago Pain of left hand   [place], Enobong, MD   2 years ago Essential hypertension, benign   [place], Enobong, MD      Future Appointments            In 3 weeks Charlott Rakes, MD [place]

## 2020-12-27 ENCOUNTER — Other Ambulatory Visit: Payer: Self-pay | Admitting: Family Medicine

## 2020-12-27 DIAGNOSIS — J328 Other chronic sinusitis: Secondary | ICD-10-CM

## 2020-12-27 NOTE — Telephone Encounter (Signed)
Requested Prescriptions  Pending Prescriptions Disp Refills  . cetirizine (ZYRTEC) 10 MG tablet [Pharmacy Med Name: CETIRIZINE 10MG  TABLETS] 90 tablet 0    Sig: TAKE 1 TABLET(10 MG) BY MOUTH DAILY     Ear, Nose, and Throat:  Antihistamines Passed - 12/27/2020  9:54 AM      Passed - Valid encounter within last 12 months    Recent Outpatient Visits          5 months ago Need for immunization against influenza   [place], Enobong, MD   6 months ago Essential hypertension, benign   [place], [place], [place]   1 year ago Other chronic sinusitis   [place], Enobong, MD   1 year ago Pain of left hand   [place], Enobong, MD   2 years ago Essential hypertension, benign   [place], MD      Future Appointments            In 1 week Charlott Rakes, MD [place]

## 2020-12-31 ENCOUNTER — Other Ambulatory Visit: Payer: Self-pay | Admitting: Family Medicine

## 2020-12-31 DIAGNOSIS — I10 Essential (primary) hypertension: Secondary | ICD-10-CM

## 2021-01-01 NOTE — Telephone Encounter (Signed)
Note attached to sig that pt must keep upcoming appt for more refills. Pt asking for med early- has OV on 01/06/21. Routing to office.

## 2021-01-01 NOTE — Telephone Encounter (Signed)
Requested Prescriptions  Pending Prescriptions Disp Refills  . albuterol (VENTOLIN HFA) 108 (90 Base) MCG/ACT inhaler [Pharmacy Med Name: ALBUTEROL HFA INH (200 PUFFS) 6.7GM] 6.7 g     Sig: INHALE 2 PUFFS INTO THE LUNGS EVERY 6 HOURS AS NEEDED FOR WHEEZING OR SHORTNESS OF BREATH     Pulmonology:  Beta Agonists Failed - 12/31/2020  4:14 PM      Failed - One inhaler should last at least one month. If the patient is requesting refills earlier, contact the patient to check for uncontrolled symptoms.      Passed - Valid encounter within last 12 months    Recent Outpatient Visits          5 months ago Need for immunization against influenza   [place], Enobong, MD   6 months ago Essential hypertension, benign   [place], [place], [place]   1 year ago Other chronic sinusitis   [place], Enobong, MD   1 year ago Pain of left hand   [place], Enobong, MD   2 years ago Essential hypertension, benign   [place], Enobong, MD      Future Appointments            In 5 days Charlott Rakes, MD [place]           . carvedilol (COREG) 6.25 MG tablet [Pharmacy Med Name: CARVEDILOL 6.25MG  TABLETS] 60 tablet 0    Sig: TAKE 1 TABLET(6.25 MG) BY MOUTH TWICE DAILY WITH A MEAL     Cardiovascular:  Beta Blockers Passed - 12/31/2020  4:14 PM      Passed - Last BP in normal range    BP Readings from Last 1 Encounters:  07/08/20 138/69         Passed - Last Heart Rate in normal range    Pulse Readings from Last 1 Encounters:  07/08/20 71         Passed - Valid encounter within last 6 months    Recent Outpatient Visits          5 months ago Need for immunization against influenza   [place], Enobong, MD   6 months ago Essential  hypertension, benign   [place], [place], [place]   1 year ago Other chronic sinusitis   [place], Enobong, MD   1 year ago Pain of left hand   [place], Enobong, MD   2 years ago Essential hypertension, benign   [place], Enobong, MD      Future Appointments            In 5 days Charlott Rakes, MD [place]

## 2021-01-03 ENCOUNTER — Other Ambulatory Visit: Payer: Self-pay | Admitting: Family Medicine

## 2021-01-03 DIAGNOSIS — I10 Essential (primary) hypertension: Secondary | ICD-10-CM

## 2021-01-06 ENCOUNTER — Encounter: Payer: Self-pay | Admitting: Family Medicine

## 2021-01-06 ENCOUNTER — Ambulatory Visit: Payer: Medicare Other | Attending: Family Medicine | Admitting: Family Medicine

## 2021-01-06 ENCOUNTER — Other Ambulatory Visit: Payer: Self-pay

## 2021-01-06 DIAGNOSIS — M19042 Primary osteoarthritis, left hand: Secondary | ICD-10-CM | POA: Diagnosis not present

## 2021-01-06 DIAGNOSIS — M19041 Primary osteoarthritis, right hand: Secondary | ICD-10-CM

## 2021-01-06 DIAGNOSIS — I10 Essential (primary) hypertension: Secondary | ICD-10-CM | POA: Diagnosis not present

## 2021-01-06 DIAGNOSIS — E785 Hyperlipidemia, unspecified: Secondary | ICD-10-CM | POA: Diagnosis not present

## 2021-01-06 MED ORDER — MELOXICAM 7.5 MG PO TABS: ORAL_TABLET | ORAL | 1 refills | Status: DC

## 2021-01-06 MED ORDER — ATORVASTATIN CALCIUM 20 MG PO TABS: ORAL_TABLET | ORAL | 1 refills | Status: DC

## 2021-01-06 MED ORDER — HYDROCHLOROTHIAZIDE 25 MG PO TABS: ORAL_TABLET | ORAL | 1 refills | Status: DC

## 2021-01-06 MED ORDER — AMLODIPINE BESYLATE 10 MG PO TABS: ORAL_TABLET | ORAL | 1 refills | Status: DC

## 2021-01-06 MED ORDER — CARVEDILOL 6.25 MG PO TABS: 6.2500 mg | ORAL_TABLET | Freq: Two times a day (BID) | ORAL | 1 refills | Status: DC

## 2021-01-06 NOTE — Progress Notes (Signed)
Subjective:  Patient ID: Dustin [name], male    DOB: 20-Nov-1955  Age: 65 y.o. MRN: [id number]  CC: Hypertension   HPI [name]  is a 65 year old male with a history of hypertension, hypercholesterolemia, osteoarthritis of the hands who presents today for follow-up visit. His arthritis is controlled on meloxicam.  He also has intermittent knee pains right greater than left.  Has had previous right knee surgery. Compliant with his antihypertensive and his statin and denies adverse effects from his medications He has no additional concerns today.  Past Medical History:  Diagnosis Date  . Arthritis    bil knees, left hand  . Carcinoid tumor   . DVT of upper extremity (deep vein thrombosis) (HCC)    left brachial vein, 12/2010  . Helicobacter pylori gastritis   . History of blood clots    to left arm  . Hypertension   . Shortness of breath dyspnea    with exertion  . Tubular adenoma of colon 2017    Past Surgical History:  Procedure Laterality Date  . colonscopy      1 year ago   . KNEE ARTHROPLASTY Right 04/30/2015   Procedure: COMPUTER ASSISTED TOTAL KNEE ARTHROPLASTY;  Surgeon: Marybelle Killings, MD;  Location: [facility];  Service: Orthopedics;  Laterality: Right;  . Left  Hand   2011   cyst removed.  Marland Kitchen TOTAL KNEE ARTHROPLASTY  10/19/2011   Procedure: TOTAL KNEE ARTHROPLASTY;  Surgeon: Sharmon Revere;  Location: [facility];  Service: Orthopedics;  Laterality: Left;    Family History  Problem Relation Age of Onset  . Cancer Mother   . Cancer Father        prostate cancer  . Diabetes Other   . Hypertension Other   . Diabetes Maternal Uncle   . Anesthesia problems Neg Hx   . Hypotension Neg Hx   . Malignant hyperthermia Neg Hx   . Pseudochol deficiency Neg Hx     No Known Allergies  Outpatient Medications Prior to Visit  Medication Sig Dispense Refill  . albuterol (VENTOLIN HFA) 108 (90 Base) MCG/ACT inhaler INHALE 2 PUFFS INTO THE LUNGS EVERY 6 HOURS AS NEEDED FOR  WHEEZING OR SHORTNESS OF BREATH 6.7 g 0  . ASPIRIN 81 PO Take by mouth.    Marland Kitchen aspirin EC 325 MG tablet Take 1 tablet (325 mg total) by mouth daily. 30 tablet 0  . cetirizine (ZYRTEC) 10 MG tablet TAKE 1 TABLET(10 MG) BY MOUTH DAILY 90 tablet 0  . diclofenac Sodium (VOLTAREN) 1 % GEL APPLY 4 GRAMS EXTERNALLY TO THE AFFECTED AREA FOUR TIMES DAILY 100 g 2  . fluticasone (FLONASE) 50 MCG/ACT nasal spray PLACE 2 SPRAYS IN EACH NOSTRIL DAILY 48 g 6  . vitamin E 180 MG (400 UNITS) capsule Take 800 Units by mouth daily.    Marland Kitchen amLODipine (NORVASC) 10 MG tablet TAKE 1 TABLET(10 MG) BY MOUTH DAILY 30 tablet 3  . atorvastatin (LIPITOR) 20 MG tablet TAKE 1 TABLET(20 MG) BY MOUTH DAILY 30 tablet 6  . carvedilol (COREG) 6.25 MG tablet TAKE 1 TABLET(6.25 MG) BY MOUTH TWICE DAILY WITH A MEAL 60 tablet 0  . hydrochlorothiazide (HYDRODIURIL) 25 MG tablet TAKE 1 TABLET(25 MG) BY MOUTH DAILY 30 tablet 0  . meloxicam (MOBIC) 7.5 MG tablet TAKE 1 TABLET(7.5 MG) BY MOUTH DAILY 30 tablet 3  . methylPREDNISolone (MEDROL DOSEPAK) 4 MG TBPK tablet Taper PO, over 6 days please (Patient not taking: No sig reported) 21 tablet  0   No facility-administered medications prior to visit.     ROS Review of Systems  Constitutional: Negative for activity change and appetite change.  HENT: Negative for sinus pressure and sore throat.   Eyes: Negative for visual disturbance.  Respiratory: Negative for cough, chest tightness and shortness of breath.   Cardiovascular: Negative for chest pain and leg swelling.  Gastrointestinal: Negative for abdominal distention, abdominal pain, constipation and diarrhea.  Endocrine: Negative.   Genitourinary: Negative for dysuria.  Musculoskeletal: Negative for joint swelling and myalgias.  Skin: Negative for rash.  Allergic/Immunologic: Negative.   Neurological: Negative for weakness, light-headedness and numbness.  Psychiatric/Behavioral: Negative for dysphoric mood and suicidal ideas.     Objective:  BP (!) 144/70   Pulse 65   Ht 5\' 11"  (1.803 m)   Wt 235 lb (106.6 kg)   SpO2 97%   BMI 32.78 kg/m   BP/Weight 01/06/2021 11/02/4313 4/0/0867  Systolic BP 619 509 326  Diastolic BP 70 69 86  Wt. (Lbs) 235 225.8 -  BMI 32.78 31.49 -      Physical Exam Constitutional:      Appearance: He is well-developed.  Neck:     Vascular: No JVD.  Cardiovascular:     Rate and Rhythm: Normal rate.     Heart sounds: Normal heart sounds. No murmur heard.   Pulmonary:     Effort: Pulmonary effort is normal.     Breath sounds: Normal breath sounds. No wheezing or rales.  Chest:     Chest wall: No tenderness.  Abdominal:     General: Bowel sounds are normal. There is no distension.     Palpations: Abdomen is soft. There is no mass.     Tenderness: There is no abdominal tenderness.  Musculoskeletal:     Right lower leg: No edema.     Left lower leg: No edema.     Comments: Slight right knee edema Healed vertical surgical scar right knee Left knee is normal  Neurological:     Mental Status: He is alert and oriented to person, place, and time.  Psychiatric:        Mood and Affect: Mood normal.     CMP Latest Ref Rng & Units 07/08/2020 09/23/2019 11/11/2018  Glucose 65 - 99 mg/dL 102(H) 96 99  BUN 8 - 27 mg/dL 15 19 22   Creatinine 0.76 - 1.27 mg/dL 0.87 0.99 1.19  Sodium 134 - 144 mmol/L 138 140 137  Potassium 3.5 - 5.2 mmol/L 3.7 3.3(L) 3.5  Chloride 96 - 106 mmol/L 98 101 96  CO2 20 - 29 mmol/L 25 23 23   Calcium 8.6 - 10.2 mg/dL 9.9 10.3(H) 10.3(H)  Total Protein 6.0 - 8.5 g/dL 8.4 8.3 -  Total Bilirubin 0.0 - 1.2 mg/dL 0.8 1.5(H) -  Alkaline Phos 48 - 121 IU/L 79 71 -  AST 0 - 40 IU/L 24 32 -  ALT 0 - 44 IU/L 30 26 -    Lipid Panel     Component Value Date/Time   CHOL 122 07/08/2020 0904   TRIG 37 07/08/2020 0904   HDL 52 07/08/2020 0904   CHOLHDL 2.3 07/08/2020 0904   CHOLHDL 3.1 04/01/2014 1226   VLDL 9 04/01/2014 1226   LDLCALC 60 07/08/2020 0904     CBC    Component Value Date/Time   WBC 4.1 03/01/2017 0914   WBC 3.9 (L) 11/18/2015 1016   RBC 4.97 03/01/2017 0914   RBC 4.88 11/18/2015 1016   HGB  15.0 03/01/2017 0914   HCT 42.3 03/01/2017 0914   PLT 229 03/01/2017 0914   MCV 85 03/01/2017 0914   MCH 30.2 03/01/2017 0914   MCH 29.3 11/18/2015 1016   MCHC 35.5 03/01/2017 0914   MCHC 34.2 11/18/2015 1016   RDW 14.2 03/01/2017 0914   LYMPHSABS 1.2 10/03/2014 1126   MONOABS 0.6 10/03/2014 1126   EOSABS 0.0 10/03/2014 1126   BASOSABS 0.0 10/03/2014 1126    Lab Results  Component Value Date   HGBA1C 5.40 11/18/2015    Assessment & Plan:  1. Hyperlipidemia LDL goal <100 Controlled Low-cholesterol diet - atorvastatin (LIPITOR) 20 MG tablet; TAKE 1 TABLET(20 MG) BY MOUTH DAILY  Dispense: 90 tablet; Refill: 1  2. Essential hypertension, benign Slightly above goal No regimen change today but advised to work on lifestyle modification Counseled on blood pressure goal of less than 130/80, low-sodium, DASH diet, medication compliance, 150 minutes of moderate intensity exercise per week. Discussed medication compliance, adverse effects. - Basic Metabolic Panel - carvedilol (COREG) 6.25 MG tablet; Take 1 tablet (6.25 mg total) by mouth 2 (two) times daily with a meal.  Dispense: 180 tablet; Refill: 1 - amLODipine (NORVASC) 10 MG tablet; TAKE 1 TABLET(10 MG) BY MOUTH DAILY  Dispense: 90 tablet; Refill: 1 - hydrochlorothiazide (HYDRODIURIL) 25 MG tablet; TAKE 1 TABLET(25 MG) BY MOUTH DAILY  Dispense: 90 tablet; Refill: 1  3. Primary osteoarthritis of both hands - meloxicam (MOBIC) 7.5 MG tablet; 1 tablet orally daily.  Dispense: 90 tablet; Refill: 1    Meds ordered this encounter  Medications  . atorvastatin (LIPITOR) 20 MG tablet    Sig: TAKE 1 TABLET(20 MG) BY MOUTH DAILY    Dispense:  90 tablet    Refill:  1  . carvedilol (COREG) 6.25 MG tablet    Sig: Take 1 tablet (6.25 mg total) by mouth 2 (two) times daily with a  meal.    Dispense:  180 tablet    Refill:  1  . meloxicam (MOBIC) 7.5 MG tablet    Sig: 1 tablet orally daily.    Dispense:  90 tablet    Refill:  1  . amLODipine (NORVASC) 10 MG tablet    Sig: TAKE 1 TABLET(10 MG) BY MOUTH DAILY    Dispense:  90 tablet    Refill:  1  . hydrochlorothiazide (HYDRODIURIL) 25 MG tablet    Sig: TAKE 1 TABLET(25 MG) BY MOUTH DAILY    Dispense:  90 tablet    Refill:  1    Follow-up: Return in about 6 months (around 07/09/2021) for Chronic disease management.       Charlott Rakes, MD, FAAFP. [facility] [place], [place]   01/06/2021, 1:59 PM

## 2021-01-07 ENCOUNTER — Telehealth: Payer: Self-pay

## 2021-01-07 LAB — BASIC METABOLIC PANEL
BUN/Creatinine Ratio: 14 (ref 10–24)
BUN: 14 mg/dL (ref 8–27)
CO2: 24 mmol/L (ref 20–29)
Calcium: 9.9 mg/dL (ref 8.6–10.2)
Chloride: 101 mmol/L (ref 96–106)
Creatinine, Ser: 0.98 mg/dL (ref 0.76–1.27)
Glucose: 111 mg/dL — ABNORMAL HIGH (ref 65–99)
Potassium: 4 mmol/L (ref 3.5–5.2)
Sodium: 139 mmol/L (ref 134–144)
eGFR: 86 mL/min/{1.73_m2} (ref >59)

## 2021-01-07 NOTE — Telephone Encounter (Signed)
Patient name and DOB has been verified Patient was informed of lab results. Patient had no questions.  

## 2021-01-07 NOTE — Telephone Encounter (Signed)
-----   Message from Charlott Rakes, MD sent at 01/07/2021  8:36 AM EST ----- Please inform the patient that labs are normal. Thank you.

## 2021-01-24 ENCOUNTER — Other Ambulatory Visit: Payer: Self-pay | Admitting: Family Medicine

## 2021-01-24 NOTE — Telephone Encounter (Signed)
Requested medication (s) are due for refill today -no  Requested medication (s) are on the active medication list -yes  Future visit scheduled -no  Last refill: 01/03/21  Notes to clinic: Attempted to call patient to screen breathing/reason needs early RF for inhaler- left message to call back- request sent for PCP review  Requested Prescriptions  Pending Prescriptions Disp Refills   albuterol (VENTOLIN HFA) 108 (90 Base) MCG/ACT inhaler [Pharmacy Med Name: ALBUTEROL HFA INH (200 PUFFS) 6.7GM] 6.7 g 0    Sig: INHALE 2 PUFFS INTO THE LUNGS EVERY 6 HOURS AS NEEDED FOR WHEEZING OR SHORTNESS OF BREATH      Pulmonology:  Beta Agonists Failed - 01/24/2021 10:43 AM      Failed - One inhaler should last at least one month. If the patient is requesting refills earlier, contact the patient to check for uncontrolled symptoms.      Passed - Valid encounter within last 12 months    Recent Outpatient Visits           2 weeks ago Hyperlipidemia LDL goal <100   [place] Charlott Rakes, MD   6 months ago Need for immunization against influenza   [place], Enobong, MD   7 months ago Essential hypertension, benign   [place], [place], [place]   1 year ago Other chronic sinusitis   [place], Enobong, MD   1 year ago Pain of left hand   [place], Charlane Ferretti, MD                    Requested Prescriptions  Pending Prescriptions Disp Refills   albuterol (VENTOLIN HFA) 108 (90 Base) MCG/ACT inhaler [Pharmacy Med Name: ALBUTEROL HFA INH (200 PUFFS) 6.7GM] 6.7 g 0    Sig: INHALE 2 PUFFS INTO THE LUNGS EVERY 6 HOURS AS NEEDED FOR WHEEZING OR SHORTNESS OF BREATH      Pulmonology:  Beta Agonists Failed - 01/24/2021 10:43 AM      Failed - One inhaler should last at least one month. If the patient is requesting  refills earlier, contact the patient to check for uncontrolled symptoms.      Passed - Valid encounter within last 12 months    Recent Outpatient Visits           2 weeks ago Hyperlipidemia LDL goal <100   [place] Charlott Rakes, MD   6 months ago Need for immunization against influenza   [place], Enobong, MD   7 months ago Essential hypertension, benign   [place], [place], [place]   1 year ago Other chronic sinusitis   [place], Enobong, MD   1 year ago Pain of left hand   [facility] Charlott Rakes, MD

## 2021-02-13 ENCOUNTER — Emergency Department (HOSPITAL_BASED_OUTPATIENT_CLINIC_OR_DEPARTMENT_OTHER)
Admission: EM | Admit: 2021-02-13 | Discharge: 2021-02-13 | Disposition: A | Discharge status: Home / Self Care | Payer: Medicare Other | Attending: Emergency Medicine | Admitting: Emergency Medicine

## 2021-02-13 ENCOUNTER — Encounter (HOSPITAL_BASED_OUTPATIENT_CLINIC_OR_DEPARTMENT_OTHER): Payer: Self-pay

## 2021-02-13 ENCOUNTER — Other Ambulatory Visit: Payer: Self-pay

## 2021-02-13 DIAGNOSIS — I10 Essential (primary) hypertension: Secondary | ICD-10-CM | POA: Insufficient documentation

## 2021-02-13 DIAGNOSIS — Z20822 Contact with and (suspected) exposure to covid-19: Secondary | ICD-10-CM

## 2021-02-13 DIAGNOSIS — U071 COVID-19: Secondary | ICD-10-CM | POA: Diagnosis not present

## 2021-02-13 DIAGNOSIS — Z7951 Long term (current) use of inhaled steroids: Secondary | ICD-10-CM | POA: Insufficient documentation

## 2021-02-13 DIAGNOSIS — Z7982 Long term (current) use of aspirin: Secondary | ICD-10-CM | POA: Insufficient documentation

## 2021-02-13 DIAGNOSIS — Z96652 Presence of left artificial knee joint: Secondary | ICD-10-CM | POA: Diagnosis not present

## 2021-02-13 DIAGNOSIS — Z79899 Other long term (current) drug therapy: Secondary | ICD-10-CM | POA: Insufficient documentation

## 2021-02-13 DIAGNOSIS — R059 Cough, unspecified: Secondary | ICD-10-CM | POA: Diagnosis present

## 2021-02-13 DIAGNOSIS — Z96651 Presence of right artificial knee joint: Secondary | ICD-10-CM | POA: Insufficient documentation

## 2021-02-13 LAB — SARS CORONAVIRUS 2 (TAT 6-24 HRS): SARS Coronavirus 2: POSITIVE — AB

## 2021-02-13 NOTE — ED Provider Notes (Signed)
[facility] EMERGENCY DEPARTMENT Provider Note   CSN: [id number] Arrival date & time: 02/13/21  0814     History Chief Complaint  Patient presents with  . Cough    Dustin [name] is a 65 y.o. male.  HPI      65yo male with history of carcinoid tumor, DVT, hypertension, hyperlipidemia presents with concern for cough, hoarse voice, subjective fever positive COVID test at home.    Friday developed cough, hoarse voice, had subjective fever, but didn't have it when checked. Feeling a little better today No shortness of breath, no chest pain No nausea or vomiting No ear pain Fiance also tested positive Has had vaccine and booster Feeling better but wanted to see if it was a false positive test.    Past Medical History:  Diagnosis Date  . Arthritis    bil knees, left hand  . Carcinoid tumor   . DVT of upper extremity (deep vein thrombosis) (HCC)    left brachial vein, 12/2010  . Helicobacter pylori gastritis   . History of blood clots    to left arm  . Hypertension   . Shortness of breath dyspnea    with exertion  . Tubular adenoma of colon 2017    Patient Active Problem List   Diagnosis Date Noted  . Hyperlipidemia LDL goal <100 03/02/2017  . 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 Hepatic cirrhosis ([place]) 04/27/2016  . Hep C w/o coma, chronic ([place]) 11/22/2015  . Pain due to total right knee replacement ([place]) 05/15/2015  . History of DVT (deep vein thrombosis) 05/09/2015  . 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 Left ventricular hypertrophy by electrocardiogram 04/07/2015  . Essential hypertension, benign 04/01/2014  . Osteoarthritis 04/01/2014    Past Surgical History:  Procedure Laterality Date  . colonscopy      1 year ago   . KNEE ARTHROPLASTY Right 04/30/2015   Procedure: COMPUTER ASSISTED TOTAL KNEE ARTHROPLASTY;  Surgeon: Marybelle Killings, MD;  Location: [facility];  Service: Orthopedics;  Laterality: Right;  . Left   Hand   2011   cyst removed.  Marland Kitchen TOTAL KNEE ARTHROPLASTY  10/19/2011   Procedure: TOTAL KNEE ARTHROPLASTY;  Surgeon: Sharmon Revere;  Location: [facility];  Service: Orthopedics;  Laterality: Left;       Family History  Problem Relation Age of Onset  . Cancer Mother   . Cancer Father        prostate cancer  . Diabetes Other   . Hypertension Other   . Diabetes Maternal Uncle   . Anesthesia problems Neg Hx   . Hypotension Neg Hx   . Malignant hyperthermia Neg Hx   . Pseudochol deficiency Neg Hx     Social History   Tobacco Use  . Smoking status: Never Smoker  . Smokeless tobacco: Never Used  Substance Use Topics  . Alcohol use: No    Alcohol/week: 0.0 standard drinks  . Drug use: No    Home Medications Prior to Admission medications   Medication Sig Start Date End Date Taking? Authorizing Provider  amLODipine (NORVASC) 10 MG tablet TAKE 1 TABLET(10 MG) BY MOUTH DAILY 01/06/21  Yes [place], Enobong, MD  ASPIRIN 81 PO Take by mouth.   Yes [provider]  atorvastatin (LIPITOR) 20 MG tablet TAKE 1 TABLET(20 MG) BY MOUTH DAILY 01/06/21  Yes [place], Enobong, MD  carvedilol (COREG) 6.25 MG tablet Take 1 tablet (6.25 mg total) by mouth 2 (two) times  daily with a meal. 01/06/21  Yes [place], Enobong, MD  albuterol (VENTOLIN HFA) 108 (90 Base) MCG/ACT inhaler INHALE 2 PUFFS INTO THE LUNGS EVERY 6 HOURS AS NEEDED FOR WHEEZING OR SHORTNESS OF BREATH 01/24/21   Charlott Rakes, MD  aspirin EC 325 MG tablet Take 1 tablet (325 mg total) by mouth daily. 04/30/15   Lanae Crumbly, PA-C  cetirizine (ZYRTEC) 10 MG tablet TAKE 1 TABLET(10 MG) BY MOUTH DAILY 12/27/20   Charlott Rakes, MD  diclofenac Sodium (VOLTAREN) 1 % GEL APPLY 4 GRAMS EXTERNALLY TO THE AFFECTED AREA FOUR TIMES DAILY 06/10/20   Freeman Caldron M, PA-C  fluticasone (FLONASE) 50 MCG/ACT nasal spray PLACE 2 SPRAYS IN EACH NOSTRIL DAILY 07/08/20   Charlott Rakes, MD  hydrochlorothiazide (HYDRODIURIL) 25 MG tablet TAKE 1 TABLET(25 MG)  BY MOUTH DAILY 01/06/21   Charlott Rakes, MD  meloxicam (MOBIC) 7.5 MG tablet 1 tablet orally daily. 01/06/21   Charlott Rakes, MD  methylPREDNISolone (MEDROL DOSEPAK) 4 MG TBPK tablet Taper PO, over 6 days please Patient not taking: No sig reported 07/04/20   Noemi Chapel, MD  vitamin E 180 MG (400 UNITS) capsule Take 800 Units by mouth daily.    [provider]    Allergies    Patient has no known allergies.  Review of Systems   Review of Systems  Constitutional: Positive for appetite change, fatigue and fever (subjective).  HENT: Positive for congestion and voice change.   Respiratory: Positive for cough. Negative for shortness of breath.   Cardiovascular: Negative for chest pain.  Gastrointestinal: Positive for diarrhea. Negative for abdominal pain, nausea and vomiting.  Musculoskeletal: Negative for myalgias.  Skin: Negative for rash.  Neurological: Negative for headaches.    Physical Exam Updated Vital Signs BP (!) 161/80 (BP Location: Right Arm)   Pulse 77   Temp 98.7 F (37.1 C) (Oral)   Resp 16   Ht 5\' 11"  (1.803 m)   Wt 98.4 kg   SpO2 96%   BMI 30.27 kg/m   Physical Exam Vitals and nursing note reviewed.  Constitutional:      General: He is not in acute distress.    Appearance: He is well-developed. He is not diaphoretic.  HENT:     Head: Normocephalic and atraumatic.  Eyes:     Conjunctiva/sclera: Conjunctivae normal.  Cardiovascular:     Rate and Rhythm: Normal rate and regular rhythm.     Heart sounds: Normal heart sounds. No murmur heard. No friction rub. No gallop.   Pulmonary:     Effort: Pulmonary effort is normal. No respiratory distress.     Breath sounds: Normal breath sounds. No wheezing or rales.  Abdominal:     General: There is no distension.     Palpations: Abdomen is soft.     Tenderness: There is no abdominal tenderness. There is no guarding.  Musculoskeletal:     Cervical back: Normal range of motion.  Skin:    General:  Skin is warm and dry.  Neurological:     Mental Status: He is alert and oriented to person, place, and time.     ED Results / Procedures / Treatments   Labs (all labs ordered are listed, but only abnormal results are displayed) Labs Reviewed  SARS CORONAVIRUS 2 (TAT 6-24 HRS) - Abnormal; Notable for the following components:      Result Value   SARS Coronavirus 2 POSITIVE (*)    All other components within normal limits    EKG  None  Radiology No results found.  Procedures Procedures   Medications Ordered in ED Medications - No data to display  ED Course  I have reviewed the triage vital signs and the nursing notes.  Pertinent labs & imaging results that were available during my care of the patient were reviewed by me and considered in my medical decision making (see chart for details).    MDM Rules/Calculators/A&P                          65yo male with history of carcinoid tumor, DVT, hypertension, hyperlipidemia presents with concern for cough, hoarse voice, subjective fever positive COVID test at home.   Suspect COVID 19. Do not suspect labs or XR will change course of care at this time. Normal oxygenation.    COVID 19 testing ordered and ambulatory referral for treatment placed.   Final Clinical Impression(s) / ED Diagnoses Final diagnoses:  Suspected COVID-19 virus infection    Rx / DC Orders ED Discharge Orders         Ordered    Ambulatory referral for Covid Treatment        02/13/21 1123           Gareth Morgan, MD 02/14/21 [phone]

## 2021-02-13 NOTE — ED Triage Notes (Signed)
Pt states cough and congestion since Friday, had positive covid home test. Subjective fever.  Taking theraflu, last taken saturday

## 2021-02-14 ENCOUNTER — Other Ambulatory Visit (HOSPITAL_COMMUNITY): Payer: Self-pay

## 2021-02-14 ENCOUNTER — Telehealth: Payer: Self-pay | Admitting: Unknown Physician Specialty

## 2021-02-14 MED ORDER — NIRMATRELVIR/RITONAVIR (PAXLOVID)TABLET
3.0000 | ORAL_TABLET | Freq: Two times a day (BID) | ORAL | 0 refills | Status: AC
  Filled 2021-02-14: qty 30, 5d supply, fill #0

## 2021-02-14 NOTE — Telephone Encounter (Signed)
Outpatient Oral COVID Treatment Note  I connected with Dustin [name] on 02/14/2021/9:44 AM by telephone and verified that I am speaking with the correct person using two identifiers.  I discussed the limitations, risks, security, and privacy concerns of performing an evaluation and management service by telephone and the availability of in person appointments. I also discussed with the patient that there may be a patient responsible charge related to this service. The patient expressed understanding and agreed to proceed.  Patient location: home Provider location: home  Diagnosis: COVID-19 infection  Purpose of visit: Discussion of potential use of Molnupiravir or Paxlovid, a new treatment for mild to moderate COVID-19 viral infection in non-hospitalized patients.   Subjective: Patient is a 65 y.o. male who has been diagnosed with COVID 19 viral infection.  Their symptoms began on 4/16 with cough.    Past Medical History:  Diagnosis Date  . Arthritis    bil knees, left hand  . Carcinoid tumor   . DVT of upper extremity (deep vein thrombosis) (HCC)    left brachial vein, 12/2010  . Helicobacter pylori gastritis   . History of blood clots    to left arm  . Hypertension   . Shortness of breath dyspnea    with exertion  . Tubular adenoma of colon 2017    No Known Allergies   Current Outpatient Medications:  .  albuterol (VENTOLIN HFA) 108 (90 Base) MCG/ACT inhaler, INHALE 2 PUFFS INTO THE LUNGS EVERY 6 HOURS AS NEEDED FOR WHEEZING OR SHORTNESS OF BREATH, Disp: 6.7 g, Rfl: 2 .  amLODipine (NORVASC) 10 MG tablet, TAKE 1 TABLET(10 MG) BY MOUTH DAILY, Disp: 90 tablet, Rfl: 1 .  ASPIRIN 81 PO, Take by mouth., Disp: , Rfl:  .  aspirin EC 325 MG tablet, Take 1 tablet (325 mg total) by mouth daily., Disp: 30 tablet, Rfl: 0 .  atorvastatin (LIPITOR) 20 MG tablet, TAKE 1 TABLET(20 MG) BY MOUTH DAILY, Disp: 90 tablet, Rfl: 1 .  carvedilol (COREG) 6.25 MG tablet, Take 1 tablet (6.25 mg total)  by mouth 2 (two) times daily with a meal., Disp: 180 tablet, Rfl: 1 .  cetirizine (ZYRTEC) 10 MG tablet, TAKE 1 TABLET(10 MG) BY MOUTH DAILY, Disp: 90 tablet, Rfl: 0 .  diclofenac Sodium (VOLTAREN) 1 % GEL, APPLY 4 GRAMS EXTERNALLY TO THE AFFECTED AREA FOUR TIMES DAILY, Disp: 100 g, Rfl: 2 .  fluticasone (FLONASE) 50 MCG/ACT nasal spray, PLACE 2 SPRAYS IN EACH NOSTRIL DAILY, Disp: 48 g, Rfl: 6 .  hydrochlorothiazide (HYDRODIURIL) 25 MG tablet, TAKE 1 TABLET(25 MG) BY MOUTH DAILY, Disp: 90 tablet, Rfl: 1 .  meloxicam (MOBIC) 7.5 MG tablet, 1 tablet orally daily., Disp: 90 tablet, Rfl: 1 .  methylPREDNISolone (MEDROL DOSEPAK) 4 MG TBPK tablet, Taper PO, over 6 days please (Patient not taking: No sig reported), Disp: 21 tablet, Rfl: 0 .  vitamin E 180 MG (400 UNITS) capsule, Take 800 Units by mouth daily., Disp: , Rfl:   Objective: Patient appears/sounds well.  They are in no apparent distress.  Breathing is non labored.  Mood and behavior are normal.  Laboratory Data:  Recent Results (from the past 2160 hour(s))  Basic Metabolic Panel     Status: Abnormal   Collection Time: 01/06/21  9:07 AM  Result Value Ref Range   Glucose 111 (H) 65 - 99 mg/dL   BUN 14 8 - 27 mg/dL   Creatinine, Ser 0.98 0.76 - 1.27 mg/dL   eGFR 86 >59 mL/min/1.73  BUN/Creatinine Ratio 14 10 - 24   Sodium 139 134 - 144 mmol/L   Potassium 4.0 3.5 - 5.2 mmol/L   Chloride 101 96 - 106 mmol/L   CO2 24 20 - 29 mmol/L   Calcium 9.9 8.6 - 10.2 mg/dL  SARS CORONAVIRUS 2 (TAT 6-24 HRS) Nasopharyngeal Nasopharyngeal Swab     Status: Abnormal   Collection Time: 02/13/21 11:09 AM   Specimen: Nasopharyngeal Swab  Result Value Ref Range   SARS Coronavirus 2 POSITIVE (A) NEGATIVE    Comment: (NOTE) SARS-CoV-2 target nucleic acids are DETECTED.  The SARS-CoV-2 RNA is generally detectable in upper and lower respiratory specimens during the acute phase of infection. Positive results are indicative of the presence of SARS-CoV-2  RNA. Clinical correlation with patient history and other diagnostic information is  necessary to determine patient infection status. Positive results do not rule out bacterial infection or co-infection with other viruses.  The expected result is Negative.  Fact Sheet for Patients: SugarRoll.be  Fact Sheet for Healthcare Providers: https://www.woods-mathews.com/  This test is not yet approved or cleared by the [place] FDA and  has been authorized for detection and/or diagnosis of SARS-CoV-2 by FDA under an Emergency Use Authorization (EUA). This EUA will remain  in effect (meaning this test can be used) for the duration of the COVID-19 declaration under Section 564(b)(1) of the Act, 21 U. S.C. section 360bbb-3(b)(1), unless the authorization is terminated or revoked sooner.   Performed at [facility], [place] [address] [place], [place] [address]      Assessment: 65 y.o. male with mild/moderate COVID 19 viral infection diagnosed on 4/16 at high risk for progression to severe COVID 19.  Plan:  This patient is a 65 y.o. male that meets the following criteria for Emergency Use Authorization of: Paxlovid 1. Age >12 yr AND > 40 kg 2. SARS-COV-2 positive test 3. Symptom onset < 5 days 4. Mild-to-moderate COVID disease with high risk for severe progression to hospitalization or death  I have spoken and communicated the following to the patient or parent/caregiver regarding: 1. Paxlovid is an unapproved drug that is authorized for use under an Emergency Use Authorization.  2. There are no adequate, approved, available products for the treatment of COVID-19 in adults who have mild-to-moderate COVID-19 and are at high risk for progressing to severe COVID-19, including hospitalization or death. 3. Other therapeutics are currently authorized. For additional information on all products authorized for treatment or prevention of COVID-19, please  see TanEmporium.pl.  4. There are benefits and risks of taking this treatment as outlined in the "Fact Sheet for Patients and Caregivers."  5. "Fact Sheet for Patients and Caregivers" was reviewed with patient. A hard copy will be provided to patient from pharmacy prior to the patient receiving treatment. 6. Patients should continue to self-isolate and use infection control measures (e.g., wear mask, isolate, social distance, avoid sharing personal items, clean and disinfect "high touch" surfaces, and frequent handwashing) according to CDC guidelines.  7. The patient or parent/caregiver has the option to accept or refuse treatment. 8. Patient medication history was reviewed for potential drug interactions:Interaction with home meds: Atorvastatin will hold and take 1/2 of Amlodipine 9. Patient's GFR was calculated to be 86, and they were therefore prescribed Normal dose (GFR>60) - nirmatrelvir 143m tab (2 tablet) by mouth twice daily AND ritonavir 1060mtab (1 tablet) by mouth twice daily   After reviewing above information with the patient, the patient agrees to receive Paxlovid.  Follow up instructions:    . Take prescription BID x 5 days as directed . Reach out to pharmacist for counseling on medication if desired . For concerns regarding further COVID symptoms please follow up with your PCP or urgent care . For urgent or life-threatening issues, seek care at your local emergency department  The patient was provided an opportunity to ask questions, and all were answered. The patient agreed with the plan and demonstrated an understanding of the instructions.   Script sent to [facility] and opted to pick up RX.  The patient was advised to call their PCP or seek an in-person evaluation if the symptoms worsen or if the condition fails to improve as anticipated.   I  provided 20 minutes of non face-to-face telephone visit time during this encounter, and > 50% was spent counseling as documented under my assessment & plan.  Kathrine Haddock, NP 02/14/2021 /9:44 AM

## 2021-02-19 ENCOUNTER — Other Ambulatory Visit: Payer: Self-pay | Admitting: Family Medicine

## 2021-02-19 DIAGNOSIS — I10 Essential (primary) hypertension: Secondary | ICD-10-CM

## 2021-03-08 ENCOUNTER — Emergency Department (HOSPITAL_COMMUNITY)
Admission: EM | Admit: 2021-03-08 | Discharge: 2021-03-08 | Disposition: A | Discharge status: Home / Self Care | Payer: Medicare Other | Attending: Emergency Medicine | Admitting: Emergency Medicine

## 2021-03-08 ENCOUNTER — Emergency Department (HOSPITAL_COMMUNITY): Payer: Medicare Other

## 2021-03-08 ENCOUNTER — Other Ambulatory Visit: Payer: Self-pay

## 2021-03-08 DIAGNOSIS — Z7982 Long term (current) use of aspirin: Secondary | ICD-10-CM | POA: Insufficient documentation

## 2021-03-08 DIAGNOSIS — Z96651 Presence of right artificial knee joint: Secondary | ICD-10-CM | POA: Diagnosis not present

## 2021-03-08 DIAGNOSIS — I10 Essential (primary) hypertension: Secondary | ICD-10-CM | POA: Insufficient documentation

## 2021-03-08 DIAGNOSIS — R059 Cough, unspecified: Secondary | ICD-10-CM | POA: Insufficient documentation

## 2021-03-08 DIAGNOSIS — R1110 Vomiting, unspecified: Secondary | ICD-10-CM | POA: Diagnosis not present

## 2021-03-08 DIAGNOSIS — Z79899 Other long term (current) drug therapy: Secondary | ICD-10-CM | POA: Insufficient documentation

## 2021-03-08 DIAGNOSIS — Z8616 Personal history of COVID-19: Secondary | ICD-10-CM | POA: Insufficient documentation

## 2021-03-08 MED ORDER — AZITHROMYCIN 250 MG PO TABS: 250.0000 mg | ORAL_TABLET | Freq: Every day | ORAL | 0 refills | Status: DC

## 2021-03-08 MED ORDER — ONDANSETRON HCL 4 MG PO TABS
4.0000 mg | ORAL_TABLET | Freq: Once | ORAL | Status: AC
  Administered 2021-03-08: 4 mg via ORAL
  Filled 2021-03-08: qty 1

## 2021-03-08 MED ORDER — DEXAMETHASONE 4 MG PO TABS
10.0000 mg | ORAL_TABLET | Freq: Once | ORAL | Status: AC
  Administered 2021-03-08: 10 mg via ORAL
  Filled 2021-03-08: qty 3

## 2021-03-08 MED ORDER — ONDANSETRON HCL 4 MG PO TABS: 4.0000 mg | ORAL_TABLET | Freq: Three times a day (TID) | ORAL | 0 refills | Status: DC | PRN

## 2021-03-08 NOTE — Discharge Instructions (Addendum)
Continue Tea and honey for cough.  Take antibiotic as prescribed.  You were given a dose of the long-acting steroid that I believe will also help with your cough.

## 2021-03-08 NOTE — ED Provider Notes (Signed)
[facility] EMERGENCY DEPARTMENT Provider Note   CSN: [id number] Arrival date & time: 03/08/21  0240     History Chief Complaint  Patient presents with  . Cough    Dustin [name] is a 65 y.o. male.  The history is provided by the patient.  Cough Cough characteristics:  Non-productive Sputum characteristics:  Nondescript Severity:  Mild Onset quality:  Gradual Timing:  Constant Progression:  Unchanged Chronicity:  New Context comment:  Patient with COVID 3 weeks ago continues to have daily cough.  Having some posttussis emesis as well.  Inhaler not helping much. Relieved by:  Nothing Worsened by:  Nothing Ineffective treatments:  Beta-agonist inhaler Associated symptoms: no chest pain, no chills, no diaphoresis, no ear fullness, no ear pain, no eye discharge, no fever, no headaches, no myalgias, no rash, no rhinorrhea, no shortness of breath, no sinus congestion, no sore throat, no weight loss and no wheezing        Past Medical History:  Diagnosis Date  . Arthritis    bil knees, left hand  . Carcinoid tumor   . DVT of upper extremity (deep vein thrombosis) (HCC)    left brachial vein, 12/2010  . Helicobacter pylori gastritis   . History of blood clots    to left arm  . Hypertension   . Shortness of breath dyspnea    with exertion  . Tubular adenoma of colon 2017    Patient Active Problem List   Diagnosis Date Noted  . Hyperlipidemia LDL goal <100 03/02/2017  . 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 Hepatic cirrhosis ([place]) 04/27/2016  . Hep C w/o coma, chronic ([place]) 11/22/2015  . Pain due to total right knee replacement ([place]) 05/15/2015  . History of DVT (deep vein thrombosis) 05/09/2015  . 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 Left ventricular hypertrophy by electrocardiogram 04/07/2015  . Essential hypertension, benign 04/01/2014  . Osteoarthritis 04/01/2014    Past Surgical History:  Procedure  Laterality Date  . colonscopy      1 year ago   . KNEE ARTHROPLASTY Right 04/30/2015   Procedure: COMPUTER ASSISTED TOTAL KNEE ARTHROPLASTY;  Surgeon: Marybelle Killings, MD;  Location: [place];  Service: Orthopedics;  Laterality: Right;  . Left  Hand   2011   cyst removed.  Marland Kitchen TOTAL KNEE ARTHROPLASTY  10/19/2011   Procedure: TOTAL KNEE ARTHROPLASTY;  Surgeon: Sharmon Revere;  Location: [place];  Service: Orthopedics;  Laterality: Left;       Family History  Problem Relation Age of Onset  . Cancer Mother   . Cancer Father        prostate cancer  . Diabetes Other   . Hypertension Other   . Diabetes Maternal Uncle   . Anesthesia problems Neg Hx   . Hypotension Neg Hx   . Malignant hyperthermia Neg Hx   . Pseudochol deficiency Neg Hx     Social History   Tobacco Use  . Smoking status: Never Smoker  . Smokeless tobacco: Never Used  Substance Use Topics  . Alcohol use: No    Alcohol/week: 0.0 standard drinks  . Drug use: No    Home Medications Prior to Admission medications   Medication Sig Start Date End Date Taking? Authorizing Provider  azithromycin (ZITHROMAX) 250 MG tablet Take 1 tablet (250 mg total) by mouth daily. Take first 2 tablets together, then 1 every day until finished. 03/08/21  Yes Kaedyn Polivka, DO  ondansetron [facility])  4 MG tablet Take 1 tablet (4 mg total) by mouth every 8 (eight) hours as needed for up to 10 doses for nausea or vomiting. 03/08/21  Yes Keyon Liller, DO  albuterol (VENTOLIN HFA) 108 (90 Base) MCG/ACT inhaler INHALE 2 PUFFS INTO THE LUNGS EVERY 6 HOURS AS NEEDED FOR WHEEZING OR SHORTNESS OF BREATH 01/24/21   Charlott Rakes, MD  amLODipine (NORVASC) 10 MG tablet TAKE 1 TABLET(10 MG) BY MOUTH DAILY 01/06/21   Charlott Rakes, MD  ASPIRIN 81 PO Take by mouth.    [provider]  aspirin EC 325 MG tablet Take 1 tablet (325 mg total) by mouth daily. 04/30/15   Lanae Crumbly, PA-C  atorvastatin (LIPITOR) 20 MG tablet TAKE 1 TABLET(20 MG) BY MOUTH  DAILY 01/06/21   Charlott Rakes, MD  carvedilol (COREG) 6.25 MG tablet Take 1 tablet (6.25 mg total) by mouth 2 (two) times daily with a meal. 01/06/21   Charlott Rakes, MD  cetirizine (ZYRTEC) 10 MG tablet TAKE 1 TABLET(10 MG) BY MOUTH DAILY 12/27/20   Charlott Rakes, MD  diclofenac Sodium (VOLTAREN) 1 % GEL APPLY 4 GRAMS EXTERNALLY TO THE AFFECTED AREA FOUR TIMES DAILY 06/10/20   Freeman Caldron M, PA-C  fluticasone (FLONASE) 50 MCG/ACT nasal spray PLACE 2 SPRAYS IN EACH NOSTRIL DAILY 07/08/20   Charlott Rakes, MD  hydrochlorothiazide (HYDRODIURIL) 25 MG tablet TAKE 1 TABLET(25 MG) BY MOUTH DAILY 02/19/21   Charlott Rakes, MD  meloxicam (MOBIC) 7.5 MG tablet 1 tablet orally daily. 01/06/21   Charlott Rakes, MD  methylPREDNISolone (MEDROL DOSEPAK) 4 MG TBPK tablet Taper PO, over 6 days please Patient not taking: No sig reported 07/04/20   Noemi Chapel, MD  vitamin E 180 MG (400 UNITS) capsule Take 800 Units by mouth daily.    [provider]    Allergies    Patient has no known allergies.  Review of Systems   Review of Systems  Constitutional: Negative for chills, diaphoresis, fever and weight loss.  HENT: Negative for ear pain, rhinorrhea and sore throat.   Eyes: Negative for pain, discharge and visual disturbance.  Respiratory: Positive for cough. Negative for shortness of breath and wheezing.   Cardiovascular: Negative for chest pain and palpitations.  Gastrointestinal: Positive for vomiting. Negative for abdominal pain and nausea.  Genitourinary: Negative for dysuria and hematuria.  Musculoskeletal: Negative for arthralgias, back pain and myalgias.  Skin: Negative for color change and rash.  Neurological: Negative for seizures, syncope and headaches.  All other systems reviewed and are negative.   Physical Exam Updated Vital Signs BP (!) 150/101 (BP Location: Right Arm)   Pulse 95   Temp 98.3 F (36.8 C)   Resp 16   Ht 5\' 11"  (1.803 m)   Wt 99.8 kg   SpO2 99%   BMI  30.68 kg/m   Physical Exam Vitals and nursing note reviewed.  Constitutional:      General: He is not in acute distress.    Appearance: He is well-developed. He is not ill-appearing.  HENT:     Head: Normocephalic and atraumatic.     Nose: Nose normal.     Mouth/Throat:     Mouth: Mucous membranes are moist.  Eyes:     Extraocular Movements: Extraocular movements intact.     Conjunctiva/sclera: Conjunctivae normal.     Pupils: Pupils are equal, round, and reactive to light.  Cardiovascular:     Rate and Rhythm: Normal rate and regular rhythm.     Pulses: Normal  pulses.     Heart sounds: Normal heart sounds. No murmur heard.   Pulmonary:     Effort: Pulmonary effort is normal. No respiratory distress.     Breath sounds: Normal breath sounds.  Abdominal:     Palpations: Abdomen is soft.     Tenderness: There is no abdominal tenderness.  Musculoskeletal:     Cervical back: Normal range of motion and neck supple.  Skin:    General: Skin is warm and dry.     Capillary Refill: Capillary refill takes less than 2 seconds.  Neurological:     General: No focal deficit present.     Mental Status: He is alert.     ED Results / Procedures / Treatments   Labs (all labs ordered are listed, but only abnormal results are displayed) Labs Reviewed - No data to display  EKG None  Radiology DG Chest Portable 1 View  Result Date: 03/08/2021 CLINICAL DATA:  Cough EXAM: PORTABLE CHEST 1 VIEW COMPARISON:  July 04, 2020 FINDINGS: Lungs are clear. Heart size is upper normal with pulmonary vascularity normal. No adenopathy. No pneumothorax. No bone lesions. IMPRESSION: Lungs clear.  Heart upper normal in size. Electronically Signed   By: Lowella Grip III M.D.   On: 03/08/2021 09:32    Procedures Procedures   Medications Ordered in ED Medications  dexamethasone (DECADRON) tablet 10 mg (has no administration in time range)  ondansetron (ZOFRAN) tablet 4 mg (4 mg Oral Given  03/08/21 5102)    ED Course  I have reviewed the triage vital signs and the nursing notes.  Pertinent labs & imaging results that were available during my care of the patient were reviewed by me and considered in my medical decision making (see chart for details).    MDM Rules/Calculators/A&P                          ROHAIL KLEES is a 65 year old male with history of high cholesterol who presents the ED with cough.  Patient with normal vitals.  No fever.  Diagnosed with COVID 3 weeks ago.  Still has cough but otherwise feels well.  Coughing is keeping him up at night.  Has had 2 or 3 episodes of posttussis emesis.  Patient is not in respiratory distress.  Overall clear breath sounds.  Has been using albuterol with some improvement.  No abdominal pain, no diarrhea, no fever.  Will obtain chest x-ray to evaluate for post viral pneumonia.  May consider antibiotic for possibly bronchitis but will consider steroids after obtaining chest x-ray.  No history of smoking or COPD but has used albuterol in the past for other cold.  Chest x-ray is normal.  Will give Z-Pak and Decadron for bronchitis.  Discharged in good condition.  Understands return precautions.  This chart was dictated using voice recognition software.  Despite best efforts to proofread,  errors can occur which can change the documentation meaning.    Final Clinical Impression(s) / ED Diagnoses Final diagnoses:  Cough    Rx / DC Orders ED Discharge Orders         Ordered    ondansetron (ZOFRAN) 4 MG tablet  Every 8 hours PRN        03/08/21 0940    azithromycin (ZITHROMAX) 250 MG tablet  Daily        03/08/21 0940           Lennice Sites, DO 03/08/21 [phone]

## 2021-03-08 NOTE — ED Triage Notes (Signed)
Pt walked to ER. C/O "coughing till I throw up."  Cough started a couple weeks ago. :It's just gotten worse"

## 2021-03-29 ENCOUNTER — Other Ambulatory Visit: Payer: Self-pay | Admitting: Family Medicine

## 2021-03-29 NOTE — Telephone Encounter (Signed)
  Notes to clinic: medication has been requested early  Review for okay to fill  Last filled on 03/09/2021  Requested Prescriptions  Pending Prescriptions Disp Refills   albuterol (VENTOLIN HFA) 108 (90 Base) MCG/ACT inhaler [Pharmacy Med Name: ALBUTEROL HFA INH (200 PUFFS) 6.7GM] 6.7 g 2    Sig: INHALE 2 PUFFS INTO THE LUNGS EVERY 6 HOURS AS NEEDED FOR WHEEZING OR SHORTNESS OF BREATH      Pulmonology:  Beta Agonists Failed - 03/29/2021 10:52 AM      Failed - One inhaler should last at least one month. If the patient is requesting refills earlier, contact the patient to check for uncontrolled symptoms.      Passed - Valid encounter within last 12 months    Recent Outpatient Visits           2 months ago Hyperlipidemia LDL goal <100   [place] Charlott Rakes, MD   8 months ago Need for immunization against influenza   [place], [place], MD   9 months ago Essential hypertension, benign   [place], [place], [place]   1 year ago Other chronic sinusitis   [place], Enobong, MD   1 year ago Pain of left hand   [facility] Charlott Rakes, MD

## 2021-04-28 ENCOUNTER — Ambulatory Visit (INDEPENDENT_AMBULATORY_CARE_PROVIDER_SITE_OTHER): Payer: Medicare Other | Admitting: Nurse Practitioner

## 2021-04-28 VITALS — BP 141/70 | HR 67 | Temp 97.9°F | Resp 18 | Ht 71.0 in | Wt 220.0 lb

## 2021-04-28 DIAGNOSIS — Z0000 Encounter for general adult medical examination without abnormal findings: Secondary | ICD-10-CM

## 2021-04-28 NOTE — Progress Notes (Signed)
Subjective:   Dustin [name] is a 65 y.o. male who presents for Medicare Annual/Subsequent preventive examination.  Review of Systems    Review of Systems  Constitutional: Negative.   HENT: Negative.    Eyes: Negative.   Respiratory: Negative.    Cardiovascular: Negative.   Gastrointestinal: Negative.   Genitourinary: Negative.   Musculoskeletal: Negative.   Skin: Negative.   Neurological: Negative.   Endo/Heme/Allergies: Negative.   Psychiatric/Behavioral: Negative.     Cardiac Risk Factors include: advanced age (>64men, >17 women);hypertension;obesity (BMI >30kg/m2)     Objective:    Today's Vitals   04/28/21 0804  BP: (!) 141/70  Pulse: 67  Resp: 18  Temp: 97.9 F (36.6 C)  SpO2: 99%  Weight: 220 lb (99.8 kg)  Height: 5\' 11"  (1.803 m)   Body mass index is 30.68 kg/m.  Advanced Directives 04/28/2021 02/13/2021 07/04/2020 03/01/2017 11/21/2016 10/09/2016 07/04/2016  Does Patient Have a Medical Advance Directive? No No No No No No No  Would patient like information on creating a medical advance directive? No - Patient declined No - Patient declined No - Patient declined No - Patient declined - - No - patient declined information    Current Medications (verified) Outpatient Encounter Medications as of 04/28/2021  Medication Sig   albuterol (VENTOLIN HFA) 108 (90 Base) MCG/ACT inhaler INHALE 2 PUFFS INTO THE LUNGS EVERY 6 HOURS AS NEEDED FOR WHEEZING OR SHORTNESS OF BREATH   amLODipine (NORVASC) 10 MG tablet TAKE 1 TABLET(10 MG) BY MOUTH DAILY   ASPIRIN 81 PO Take by mouth.   aspirin EC 325 MG tablet Take 1 tablet (325 mg total) by mouth daily.   atorvastatin (LIPITOR) 20 MG tablet TAKE 1 TABLET(20 MG) BY MOUTH DAILY   azithromycin (ZITHROMAX) 250 MG tablet Take 1 tablet (250 mg total) by mouth daily. Take first 2 tablets together, then 1 every day until finished.   carvedilol (COREG) 6.25 MG tablet Take 1 tablet (6.25 mg total) by mouth 2 (two) times daily with a meal.    cetirizine (ZYRTEC) 10 MG tablet TAKE 1 TABLET(10 MG) BY MOUTH DAILY   diclofenac Sodium (VOLTAREN) 1 % GEL APPLY 4 GRAMS EXTERNALLY TO THE AFFECTED AREA FOUR TIMES DAILY   fluticasone (FLONASE) 50 MCG/ACT nasal spray PLACE 2 SPRAYS IN EACH NOSTRIL DAILY   hydrochlorothiazide (HYDRODIURIL) 25 MG tablet TAKE 1 TABLET(25 MG) BY MOUTH DAILY   meloxicam (MOBIC) 7.5 MG tablet 1 tablet orally daily.   methylPREDNISolone (MEDROL DOSEPAK) 4 MG TBPK tablet Taper PO, over 6 days please (Patient not taking: No sig reported)   ondansetron (ZOFRAN) 4 MG tablet Take 1 tablet (4 mg total) by mouth every 8 (eight) hours as needed for up to 10 doses for nausea or vomiting.   vitamin E 180 MG (400 UNITS) capsule Take 800 Units by mouth daily.   No facility-administered encounter medications on file as of 04/28/2021.    Allergies (verified) Patient has no known allergies.   History: Past Medical History:  Diagnosis Date   Arthritis    bil knees, left hand   Carcinoid tumor    DVT of upper extremity (deep vein thrombosis) (HCC)    left brachial vein, 10/6107   Helicobacter pylori gastritis    History of blood clots    to left arm   Hypertension    Shortness of breath dyspnea    with exertion   Tubular adenoma of colon 2017   Past Surgical History:  Procedure Laterality Date  colonscopy      1 year ago    KNEE ARTHROPLASTY Right 04/30/2015   Procedure: COMPUTER ASSISTED TOTAL KNEE ARTHROPLASTY;  Surgeon: Marybelle Killings, MD;  Location: [place] [facility];  Service: Orthopedics;  Laterality: Right;   Left  Hand   2011   cyst removed.   TOTAL KNEE ARTHROPLASTY  10/19/2011   Procedure: TOTAL KNEE ARTHROPLASTY;  Surgeon: Sharmon Revere;  Location: [place];  Service: Orthopedics;  Laterality: Left;   Family History  Problem Relation Age of Onset   Cancer Mother    Cancer Father        prostate cancer   Diabetes Other    Hypertension Other    Diabetes Maternal Uncle    Anesthesia problems Neg Hx    Hypotension  Neg Hx    Malignant hyperthermia Neg Hx    Pseudochol deficiency Neg Hx    Social History   Socioeconomic History   Marital status: Single    Spouse name: Not on file   Number of children: Not on file   Years of education: Not on file   Highest education level: Not on file  Occupational History   Not on file  Tobacco Use   Smoking status: Never   Smokeless tobacco: Never  Substance and Sexual Activity   Alcohol use: No    Alcohol/week: 0.0 standard drinks   Drug use: No   Sexual activity: Yes  Other Topics Concern   Not on file  Social History Narrative   Not on file   Social Determinants of Health   Financial Resource Strain: Low Risk    Difficulty of Paying Living Expenses: Not hard at all  Food Insecurity: No Food Insecurity   Worried About Charity fundraiser in the Last Year: Never true   Ran Out of Food in the Last Year: Never true  Transportation Needs: No Transportation Needs   Lack of Transportation (Medical): No   Lack of Transportation (Non-Medical): No  Physical Activity: Sufficiently Active   Days of Exercise per Week: 4 days   Minutes of Exercise per Session: 60 min  Stress: No Stress Concern Present   Feeling of Stress : Not at all  Social Connections: Socially Isolated   Frequency of Communication with Friends and Family: More than three times a week   Frequency of Social Gatherings with Friends and Family: More than three times a week   Attends Religious Services: Never   Marine scientist or Organizations: No   Attends Music therapist: Never   Marital Status: Never married    Tobacco Counseling Counseling given: Not Answered   Clinical Intake:  Pre-visit preparation completed: No  Pain : No/denies pain     BMI - recorded: 30.68 Nutritional Risks: None Diabetes: No  What is the last grade level you completed in school?: 10th  Diabetic? no  Interpreter Needed?: No      Activities of Daily Living In your  present state of health, do you have any difficulty performing the following activities: 04/28/2021  Hearing? N  Vision? N  Difficulty concentrating or making decisions? N  Walking or climbing stairs? N  Dressing or bathing? N  Doing errands, shopping? N  Preparing Food and eating ? N  Using the Toilet? N  In the past six months, have you accidently leaked urine? N  Do you have problems with loss of bowel control? N  Managing your Medications? N  Managing your Finances? N  Housekeeping  or managing your Housekeeping? N  Some recent data might be hidden    Patient Care Team: Charlott Rakes, MD as PCP - General (Family Medicine)  Indicate any recent Medical Services you may have received from other than [facility] providers in the past year (date may be approximate).     Assessment:   This is a routine wellness examination for Raza.  Dietary issues and exercise activities discussed: Current Exercise Habits: Home exercise routine, Time (Minutes): 60, Frequency (Times/Week): 4, Weekly Exercise (Minutes/Week): 240, Intensity: Moderate, Exercise limited by: None identified   Goals Addressed             This Visit's Progress    DIET - REDUCE FAT INTAKE         Depression Screen PHQ 2/9 Scores 04/28/2021 04/28/2021 01/06/2021 07/08/2020 09/23/2019 11/11/2018 05/22/2018  PHQ - 2 Score 0 0 0 0 0 0 0  PHQ- 9 Score - - 0 0 - 0 0    Fall Risk Fall Risk  04/28/2021 01/06/2021 07/08/2020 09/23/2019 04/03/2019  Falls in the past year? 0 0 0 0 0  Number falls in past yr: 0 0 - - -  Injury with Fall? 0 0 - - -  Risk for fall due to : No Fall Risks - No Fall Risks - -  Follow up Education provided - - Falls evaluation completed -    FALL RISK PREVENTION PERTAINING TO THE HOME:  Any stairs in or around the home? No  If so, are there any without handrails? No  Home free of loose throw rugs in walkways, pet beds, electrical cords, etc? Yes  Adequate lighting in your home to reduce risk of falls?  Yes   ASSISTIVE DEVICES UTILIZED TO PREVENT FALLS:  Life alert? No  Use of a cane, walker or w/c? No  Grab bars in the bathroom? No  Shower chair or bench in shower? No  Elevated toilet seat or a handicapped toilet? No   TIMED UP AND GO:  Was the test performed? Yes .  Length of time to ambulate 10 feet: 3 sec.   Gait steady and fast without use of assistive device  Cognitive Function:        Immunizations Immunization History  Administered Date(s) Administered   Hepatitis B, adult 01/12/2016, 04/27/2016, 09/14/2016   Influenza,inj,Quad PF,6+ Mos 11/17/2014, 08/31/2015, 07/04/2016, 10/12/2017, 07/08/2020   PFIZER(Purple Top)SARS-COV-2 Vaccination 01/09/2020, 02/02/2020   Pneumococcal Polysaccharide-23 09/14/2016   Tdap 11/18/2015   Zoster Recombinat (Shingrix) 05/23/2018    TDAP status: Up to date  Flu Vaccine status: Up to date  Pneumococcal vaccine status: Due, Education has been provided regarding the importance of this vaccine. Advised may receive this vaccine at local pharmacy or Health Dept. Aware to provide a copy of the vaccination record if obtained from local pharmacy or Health Dept. Verbalized acceptance and understanding.  Covid-19 vaccine status: Completed vaccines  Qualifies for Shingles Vaccine? Yes   Zostavax completed No   Shingrix Completed?: Yes  Screening Tests Health Maintenance  Topic Date Due   Zoster Vaccines- Shingrix (2 of 2) 07/18/2018   COVID-19 Vaccine (3 - Booster for Pfizer series) 07/04/2020   PNA vac Low Risk Adult (1 of 2 - PCV13) 01/27/2021   INFLUENZA VACCINE  05/30/2021   COLONOSCOPY (Pts 45-47yrs Insurance coverage will need to be confirmed)  08/08/2021   TETANUS/TDAP  11/17/2025   Hepatitis C Screening  Completed   HIV Screening  Completed   HPV VACCINES  Aged Out  Health Maintenance  Health Maintenance Due  Topic Date Due   Zoster Vaccines- Shingrix (2 of 2) 07/18/2018   COVID-19 Vaccine (3 - Booster for Pfizer  series) 07/04/2020   PNA vac Low Risk Adult (1 of 2 - PCV13) 01/27/2021    Colorectal cancer screening: Type of screening: Colonoscopy. Completed  . Repeat every 10 years  Lung Cancer Screening: (Low Dose CT Chest recommended if Age 63-80 years, 30 pack-year currently smoking OR have quit w/in 15years.) does not qualify.   Lung Cancer Screening Referral: NA  Additional Screening:  Hepatitis C Screening: does qualify; Completed   Vision Screening: Recommended annual ophthalmology exams for early detection of glaucoma and other disorders of the eye. Is the patient up to date with their annual eye exam?  Yes  Who is the provider or what is the name of the office in which the patient attends annual eye exams? [facility] If pt is not established with a provider, would they like to be referred to a provider to establish care?  na .   Dental Screening: Recommended annual dental exams for proper oral hygiene  Community Resource Referral / Chronic Care Management: CRR required this visit?  No   CCM required this visit?  No      Plan:     I have personally reviewed and noted the following in the patient's chart:   Medical and social history Use of alcohol, tobacco or illicit drugs  Current medications and supplements including opioid prescriptions. Patient is not currently taking opioid prescriptions. Functional ability and status Nutritional status Physical activity Advanced directives List of other physicians Hospitalizations, surgeries, and ER visits in previous 12 months Vitals Screenings to include cognitive, depression, and falls Referrals and appointments  In addition, I have reviewed and discussed with patient certain preventive protocols, quality metrics, and best practice recommendations. A written personalized care plan for preventive services as well as general preventive health recommendations were provided to patient.     Fenton Foy, NP   04/28/2021

## 2021-04-28 NOTE — Patient Instructions (Addendum)
Dustin [name] , Thank you for taking time to come for your Medicare Wellness Visit. I appreciate your ongoing commitment to your health goals. Please review the following plan we discussed and let me know if I can assist you in the future.   These are the goals we discussed:  Goals      DIET - REDUCE FAT INTAKE        This is a list of the screening recommended for you and due dates:  Health Maintenance  Topic Date Due   Zoster (Shingles) Vaccine (2 of 2) 07/18/2018   COVID-19 Vaccine (3 - Booster for Pfizer series) 07/04/2020   Pneumonia vaccines (1 of 2 - PCV13) 01/27/2021   Flu Shot  05/30/2021   Colon Cancer Screening  08/08/2021   Tetanus Vaccine  11/17/2025   Hepatitis C Screening: USPSTF Recommendation to screen - Ages 18-79 yo.  Completed   HIV Screening  Completed   HPV Vaccine  Aged Out   Critical care medicine: Principles of diagnosis and management in the adult (4th ed., pp. 3151-7616). [place]."> Miller's anesthesia (8th ed., pp. 232-250). [place].">  Advance Directive  Advance directives are legal documents that allow you to make decisions about your health care and medical treatment in case you become unable to communicate for yourself. Advance directives let your wishes be known to family, friends,and health care providers. Discussing and writing advance directives should happen over time rather than all at once. Advance directives can be changed and updated at any time. There are different types of advance directives, such as: Medical power of attorney. Living will. Do not resuscitate (DNR) order or do not attempt resuscitation (DNAR) order. Health care proxy and medical power of attorney A health care proxy is also called a health care agent. This person is appointed to make medical decisions for you when you are unable to make decisions for yourself. Generally, people ask a trusted friend or family member to act as their proxy and represent their preferences. Make  sure you have an agreement with your trusted person to act as your proxy. A proxy may have tomake a medical decision on your behalf if your wishes are not known. A medical power of attorney, also called a durable power of attorney for health care, is a legal document that names your health care proxy. Depending on the laws in your state, the document may need to be: Signed. Notarized. Dated. Copied. Witnessed. Incorporated into your medical record. You may also want to appoint a trusted person to manage your money in the event you are unable to do so. This is called a durable power of attorney for finances. It is a separate legal document from the durable power of attorney for health care. You may choose your health care proxy or someone different toact as your agent in money matters. If you do not appoint a proxy, or there is a concern that the proxy is not acting in your best interest, a court may appoint a guardian to act on yourbehalf. Living will A living will is a set of instructions that state your wishes about medical care when you cannot express them yourself. Health care providers should keep a copy of your living will in your medical record. You may want to give a copy to family members or friends. To alert caregivers in case of an emergency, you can place a card in your wallet to let them know that you have a living will and where they can  find it. A living will is used if you become: Terminally ill. Disabled. Unable to communicate or make decisions. The following decisions should be included in your living will: To use or not to use life support equipment, such as dialysis machines and breathing machines (ventilators). Whether you want a DNR or DNAR order. This tells health care providers not to use cardiopulmonary resuscitation (CPR) if breathing or heartbeat stops. To use or not to use tube feeding. To be given or not to be given food and fluids. Whether you want comfort  (palliative) care when the goal becomes comfort rather than a cure. Whether you want to donate your organs and tissues. A living will does not give instructions for distributing your money andproperty if you should pass away. DNR or DNAR A DNR or DNAR order is a request not to have CPR in the event that your heart stops beating or you stop breathing. If a DNR or DNAR order has not been made and shared, a health care provider will try to help any patient whose heart has stopped or who has stopped breathing. If you plan to have surgery, talk with your health care provider about how your DNR or DNAR order will be followed ifproblems occur. What if I do not have an advance directive? Some states assign family decision makers to act on your behalf if you do not have an advance directive. Each state has its own laws about advance directives. You may want to check with your health care provider, attorney, orstate representative about the laws in your state. Summary Advance directives are legal documents that allow you to make decisions about your health care and medical treatment in case you become unable to communicate for yourself. The process of discussing and writing advance directives should happen over time. You can change and update advance directives at any time. Advance directives may include a medical power of attorney, a living will, and a DNR or DNAR order. This information is not intended to replace advice given to you by your health care provider. Make sure you discuss any questions you have with your healthcare provider. Document Revised: 07/20/2020 Document Reviewed: 07/20/2020 Elsevier Patient Education  2022 [place].  Colonoscopy, Adult A colonoscopy is a procedure to look at the entire large intestine. This procedure is done using a long, thin, flexible tube that has a camera on theend. You may have a colonoscopy: As a part of normal colorectal screening. If you have certain  symptoms, such as: A low number of red blood cells in your blood (anemia). Diarrhea that does not go away. Pain in your abdomen. Blood in your stool. A colonoscopy can help screen for and diagnose medical problems, including: Tumors. Extra tissue that grows where mucus forms (polyps). Inflammation. Areas of bleeding. Tell your health care provider about: Any allergies you have. All medicines you are taking, including vitamins, herbs, eye drops, creams, and over-the-counter medicines. Any problems you or family members have had with anesthetic medicines. Any blood disorders you have. Any surgeries you have had. Any medical conditions you have. Any problems you have had with having bowel movements. Whether you are pregnant or may be pregnant. What are the risks? Generally, this is a safe procedure. However, problems may occur, including: Bleeding. Damage to your intestine. Allergic reactions to medicines given during the procedure. Infection. This is rare. What happens before the procedure? Eating and drinking restrictions Follow instructions from your health care provider about eating or drinking restrictions, which may  include: A few days before the procedure: Follow a low-fiber diet. Avoid nuts, seeds, dried fruit, raw fruits, and vegetables. 1-3 days before the procedure: Eat only gelatin dessert or ice pops. Drink only clear liquids, such as water, clear juice, clear broth or bouillon, black coffee or tea, or clear soft drinks or sports drinks. Avoid liquids that contain red or purple dye. The day of the procedure: Do not eat solid foods. You may continue to drink clear liquids until up to 2 hours before the procedure. Do not eat or drink anything starting 2 hours before the procedure, or within the time period that your health care provider recommends. Bowel prep If you were prescribed a bowel prep to take by mouth (orally) to clean out your colon: Take it as told by your  health care provider. Starting the day before your procedure, you will need to drink a large amount of liquid medicine. The liquid will cause you to have many bowel movements of loose stool until your stool becomes almost clear or light green. If your skin or the opening between the buttocks (anus) gets irritated from diarrhea, you may relieve the irritation using: Wipes with medicine in them, such as adult wet wipes with aloe and vitamin E. A product to soothe skin, such as petroleum jelly. If you vomit while drinking the bowel prep: Take a break for up to 60 minutes. Begin the bowel prep again. Call your health care provider if you keep vomiting or you cannot take the bowel prep without vomiting. To clean out your colon, you may also be given: Laxative medicines. These help you have a bowel movement. Instructions for enema use. An enema is liquid medicine injected into your rectum. Medicines Ask your health care provider about: Changing or stopping your regular medicines or supplements. This is especially important if you are taking iron supplements, diabetes medicines, or blood thinners. Taking medicines such as aspirin and ibuprofen. These medicines can thin your blood. Do not take these medicines unless your health care provider tells you to take them. Taking over-the-counter medicines, vitamins, herbs, and supplements. General instructions Ask your health care provider what steps will be taken to help prevent infection. These may include washing skin with a germ-killing soap. Plan to have someone take you home from the hospital or clinic. What happens during the procedure?  An IV will be inserted into one of your veins. You may be given one or more of the following: A medicine to help you relax (sedative). A medicine to numb the area (local anesthetic). A medicine to make you fall asleep (general anesthetic). This is rarely needed. You will lie on your side with your knees bent. The  tube will: Have oil or gel put on it (be lubricated). Be inserted into your anus. Be gently eased through all parts of your large intestine. Air will be sent into your colon to keep it open. This may cause some pressure or cramping. Images will be taken with the camera and will appear on a screen. A small tissue sample may be removed to be looked at under a microscope (biopsy). The tissue may be sent to a lab for testing if any signs of problems are found. If small polyps are found, they may be removed and checked for cancer cells. When the procedure is finished, the tube will be removed. The procedure may vary among health care providers and hospitals. What happens after the procedure? Your blood pressure, heart rate, breathing rate, and blood  oxygen level will be monitored until you leave the hospital or clinic. You may have a small amount of blood in your stool. You may pass gas and have mild cramping or bloating in your abdomen. This is caused by the air that was used to open your colon during the exam. Do not drive for 24 hours after the procedure. It is up to you to get the results of your procedure. Ask your health care provider, or the department that is doing the procedure, when your results will be ready. Summary A colonoscopy is a procedure to look at the entire large intestine. Follow instructions from your health care provider about eating and drinking before the procedure. If you were prescribed an oral bowel prep to clean out your colon, take it as told by your health care provider. During the colonoscopy, a flexible tube with a camera on its end is inserted into the anus and then passed into the other parts of the large intestine. This information is not intended to replace advice given to you by your health care provider. Make sure you discuss any questions you have with your healthcare provider. Document Revised: 05/09/2019 Document Reviewed: 05/09/2019 Elsevier Patient  Education  [place] Prevention in the Home, Adult Falls can cause injuries and can happen to people of all ages. There are many things you can do to make your home safe and to help prevent falls. Ask forhelp when making these changes. What actions can I take to prevent falls? General Instructions Use good lighting in all rooms. Replace any light bulbs that burn out. Turn on the lights in dark areas. Use night-lights. Keep items that you use often in easy-to-reach places. Lower the shelves around your home if needed. Set up your furniture so you have a clear path. Avoid moving your furniture around. Do not have throw rugs or other things on the floor that can make you trip. Avoid walking on wet floors. If any of your floors are uneven, fix them. Add color or contrast paint or tape to clearly mark and help you see: Grab bars or handrails. First and last steps of staircases. Where the edge of each step is. If you use a stepladder: Make sure that it is fully opened. Do not climb a closed stepladder. Make sure the sides of the stepladder are locked in place. Ask someone to hold the stepladder while you use it. Know where your pets are when moving through your home. What can I do in the bathroom?     Keep the floor dry. Clean up any water on the floor right away. Remove soap buildup in the tub or shower. Use nonskid mats or decals on the floor of the tub or shower. Attach bath mats securely with double-sided, nonslip rug tape. If you need to sit down in the shower, use a plastic, nonslip stool. Install grab bars by the toilet and in the tub and shower. Do not use towel bars as grab bars. What can I do in the bedroom? Make sure that you have a light by your bed that is easy to reach. Do not use any sheets or blankets for your bed that hang to the floor. Have a firm chair with side arms that you can use for support when you get dressed. What can I do in the  kitchen? Clean up any spills right away. If you need to reach something above you, use a step stool with a grab bar. Keep  electrical cords out of the way. Do not use floor polish or wax that makes floors slippery. What can I do with my stairs? Do not leave any items on the stairs. Make sure that you have a light switch at the top and the bottom of the stairs. Make sure that there are handrails on both sides of the stairs. Fix handrails that are broken or loose. Install nonslip stair treads on all your stairs. Avoid having throw rugs at the top or bottom of the stairs. Choose a carpet that does not hide the edge of the steps on the stairs. Check carpeting to make sure that it is firmly attached to the stairs. Fix carpet that is loose or worn. What can I do on the outside of my home? Use bright outdoor lighting. Fix the edges of walkways and driveways and fix any cracks. Remove anything that might make you trip as you walk through a door, such as a raised step or threshold. Trim any bushes or trees on paths to your home. Check to see if handrails are loose or broken and that both sides of all steps have handrails. Install guardrails along the edges of any raised decks and porches. Clear paths of anything that can make you trip, such as tools or rocks. Have leaves, snow, or ice cleared regularly. Use sand or salt on paths during winter. Clean up any spills in your garage right away. This includes grease or oil spills. What other actions can I take? Wear shoes that: Have a low heel. Do not wear high heels. Have rubber bottoms. Feel good on your feet and fit well. Are closed at the toe. Do not wear open-toe sandals. Use tools that help you move around if needed. These include: Canes. Walkers. Scooters. Crutches. Review your medicines with your doctor. Some medicines can make you feel dizzy. This can increase your chance of falling. Ask your doctor what else you can do to help prevent  falls. Where to find more information Centers for Disease Control and Prevention, [place]: http://www.wolf.info/ National Institute on Aging: http://kim-miller.com/ Contact a doctor if: You are afraid of falling at home. You feel weak, drowsy, or dizzy at home. You fall at home. Summary There are many simple things that you can do to make your home safe and to help prevent falls. Ways to make your home safe include removing things that can make you trip and installing grab bars in the bathroom. Ask for help when making these changes in your home. This information is not intended to replace advice given to you by your health care provider. Make sure you discuss any questions you have with your healthcare provider. Document Revised: 05/19/2020 Document Reviewed: 05/19/2020 Elsevier Patient Education  [place] Maintenance, Male Adopting a healthy lifestyle and getting preventive care are important in promoting health and wellness. Ask your health care provider about: The right schedule for you to have regular tests and exams. Things you can do on your own to prevent diseases and keep yourself healthy. What should I know about diet, weight, and exercise? Eat a healthy diet  Eat a diet that includes plenty of vegetables, fruits, low-fat dairy products, and lean protein. Do not eat a lot of foods that are high in solid fats, added sugars, or sodium.  Maintain a healthy weight Body mass index (BMI) is a measurement that can be used to identify possible weight problems. It estimates body fat based on height and weight. Your health care  provider can help determine your BMI and help you achieve or maintain ahealthy weight. Get regular exercise Get regular exercise. This is one of the most important things you can do for your health. Most adults should: Exercise for at least 150 minutes each week. The exercise should increase your heart rate and make you sweat (moderate-intensity exercise). Do  strengthening exercises at least twice a week. This is in addition to the moderate-intensity exercise. Spend less time sitting. Even light physical activity can be beneficial. Watch cholesterol and blood lipids Have your blood tested for lipids and cholesterol at 65 years of age, then havethis test every 5 years. You may need to have your cholesterol levels checked more often if: Your lipid or cholesterol levels are high. You are older than 65 years of age. You are at high risk for heart disease. What should I know about cancer screening? Many types of cancers can be detected early and may often be prevented. Depending on your health history and family history, you may need to have cancer screening at various ages. This may include screening for: Colorectal cancer. Prostate cancer. Skin cancer. Lung cancer. What should I know about heart disease, diabetes, and high blood pressure? Blood pressure and heart disease High blood pressure causes heart disease and increases the risk of stroke. This is more likely to develop in people who have high blood pressure readings, are of African descent, or are overweight. Talk with your health care provider about your target blood pressure readings. Have your blood pressure checked: Every 3-5 years if you are 60-22 years of age. Every year if you are 48 years old or older. If you are between the ages of 78 and 44 and are a current or former smoker, ask your health care provider if you should have a one-time screening for abdominal aortic aneurysm (AAA). Diabetes Have regular diabetes screenings. This checks your fasting blood sugar level. Have the screening done: Once every three years after age 32 if you are at a normal weight and have a low risk for diabetes. More often and at a younger age if you are overweight or have a high risk for diabetes. What should I know about preventing infection? Hepatitis B If you have a higher risk for hepatitis B, you  should be screened for this virus. Talk with your health care provider to find out if you are at risk forhepatitis B infection. Hepatitis C Blood testing is recommended for: Everyone born from [birth year] through 1965. Anyone with known risk factors for hepatitis C. Sexually transmitted infections (STIs) You should be screened each year for STIs, including gonorrhea and chlamydia, if: You are sexually active and are younger than 66 years of age. You are older than 65 years of age and your health care provider tells you that you are at risk for this type of infection. Your sexual activity has changed since you were last screened, and you are at increased risk for chlamydia or gonorrhea. Ask your health care provider if you are at risk. Ask your health care provider about whether you are at high risk for HIV. Your health care provider may recommend a prescription medicine to help prevent HIV infection. If you choose to take medicine to prevent HIV, you should first get tested for HIV. You should then be tested every 3 months for as long as you are taking the medicine. Follow these instructions at home: Lifestyle Do not use any products that contain nicotine or tobacco, such as cigarettes,  e-cigarettes, and chewing tobacco. If you need help quitting, ask your health care provider. Do not use street drugs. Do not share needles. Ask your health care provider for help if you need support or information about quitting drugs. Alcohol use Do not drink alcohol if your health care provider tells you not to drink. If you drink alcohol: Limit how much you have to 0-2 drinks a day. Be aware of how much alcohol is in your drink. In the [place] one drink equals one 12 oz bottle of beer (355 mL), one 5 oz glass of wine (148 mL), or one 1 oz glass of hard liquor (44 mL). General instructions Schedule regular health, dental, and eye exams. Stay current with your vaccines. Tell your health care provider if: You often  feel depressed. You have ever been abused or do not feel safe at home. Summary Adopting a healthy lifestyle and getting preventive care are important in promoting health and wellness. Follow your health care provider's instructions about healthy diet, exercising, and getting tested or screened for diseases. Follow your health care provider's instructions on monitoring your cholesterol and blood pressure. This information is not intended to replace advice given to you by your health care provider. Make sure you discuss any questions you have with your healthcare provider. Document Revised: 10/09/2018 Document Reviewed: 10/09/2018 Elsevier Patient Education  2022 [employer].

## 2021-06-01 ENCOUNTER — Ambulatory Visit: Payer: Medicare Other | Attending: Physician Assistant | Admitting: Physician Assistant

## 2021-06-01 ENCOUNTER — Other Ambulatory Visit: Payer: Self-pay

## 2021-06-01 DIAGNOSIS — R059 Cough, unspecified: Secondary | ICD-10-CM

## 2021-06-01 MED ORDER — OMEPRAZOLE 20 MG PO CPDR: 20.0000 mg | DELAYED_RELEASE_CAPSULE | Freq: Every day | ORAL | 3 refills | Status: DC

## 2021-06-01 MED ORDER — ALBUTEROL SULFATE HFA 108 (90 BASE) MCG/ACT IN AERS: 2.0000 | INHALATION_SPRAY | Freq: Four times a day (QID) | RESPIRATORY_TRACT | 2 refills | Status: DC | PRN

## 2021-06-01 NOTE — Progress Notes (Signed)
Patient ID: Dustin [name], male   DOB: [birth year], 65 y.o.   MRN: [id number] Virtual Visit via Telephone Note  I connected with Dustin [name] on 06/01/21 at 10:50 AM EDT by telephone and verified that I am speaking with the correct person using two identifiers.  Location: Patient: home Provider: [facility] office   I discussed the limitations, risks, security and privacy concerns of performing an evaluation and management service by telephone and the availability of in person appointments. I also discussed with the patient that there may be a patient responsible charge related to this service. The patient expressed understanding and agreed to proceed.   History of Present Illness:  intermittent cough and wheezing. Not every day or night. He is using his albuterol every other day and it helps some.  Cough is non-productive.  It helps if he sleeps propped up a little.  he denies SOB/CP/edema.      Observations/Objective: NAD.  A&Ox3   Assessment and Plan: 1. Cough No signs of failure - omeprazole (PRILOSEC) 20 MG capsule; Take 1 capsule (20 mg total) by mouth daily.  Dispense: 30 capsule; Refill: 3 - albuterol (VENTOLIN HFA) 108 (90 Base) MCG/ACT inhaler; Inhale 2 puffs into the lungs every 6 (six) hours as needed for wheezing or shortness of breath.  Dispense: 18 g; Refill: 2    Follow Up Instructions: Phone appt in 3 weeks to see if there is improvement   I discussed the assessment and treatment plan with the patient. The patient was provided an opportunity to ask questions and all were answered. The patient agreed with the plan and demonstrated an understanding of the instructions.   The patient was advised to call back or seek an in-person evaluation if the symptoms worsen or if the condition fails to improve as anticipated.  I provided 15 minutes of non-face-to-face time during this encounter.   Freeman Caldron, PA-C

## 2021-06-20 ENCOUNTER — Telehealth: Payer: Self-pay | Admitting: *Deleted

## 2021-06-20 NOTE — Telephone Encounter (Signed)
-----   Message from [place] Donovan, [place] sent at 06/01/2021 11:37 AM EDT ----- Phone appt in 3 weeks to see if there is improvement

## 2021-06-27 ENCOUNTER — Other Ambulatory Visit: Payer: Self-pay | Admitting: Family Medicine

## 2021-06-27 DIAGNOSIS — I10 Essential (primary) hypertension: Secondary | ICD-10-CM

## 2021-06-27 DIAGNOSIS — M19041 Primary osteoarthritis, right hand: Secondary | ICD-10-CM

## 2021-06-27 MED ORDER — MELOXICAM 7.5 MG PO TABS: ORAL_TABLET | ORAL | 0 refills | Status: DC

## 2021-06-27 MED ORDER — CARVEDILOL 6.25 MG PO TABS: 6.2500 mg | ORAL_TABLET | Freq: Two times a day (BID) | ORAL | 0 refills | Status: DC

## 2021-06-27 MED ORDER — HYDROCHLOROTHIAZIDE 25 MG PO TABS: ORAL_TABLET | ORAL | 0 refills | Status: DC

## 2021-06-27 NOTE — Telephone Encounter (Signed)
Medication Refill - Medication:  hydrochlorothiazide (HYDRODIURIL) 25 MG tablet  carvedilol (COREG) 6.25 MG tablet  meloxicam (MOBIC) 7.5 MG tablet   *Pt does have appt on 11/23, and wants to see about getting a refill to hold until appt. Please advise.*  Has the patient contacted their pharmacy? No.  Preferred Pharmacy (with phone number or street name):  [facility] [phone] - [place], [place] AT [address] Phone:  [phone]  Fax:  [phone]      Agent: Please be advised that RX refills may take up to 3 business days. We ask that you follow-up with your pharmacy.

## 2021-06-27 NOTE — Telephone Encounter (Signed)
Requested Prescriptions  Pending Prescriptions Disp Refills  . hydrochlorothiazide (HYDRODIURIL) 25 MG tablet 90 tablet 0    Sig: .     Cardiovascular: Diuretics - Thiazide Failed - 06/27/2021  2:29 PM      Failed - Last BP in normal range    BP Readings from Last 1 Encounters:  04/28/21 (!) 141/70         Passed - Ca in normal range and within 360 days    Calcium  Date Value Ref Range Status  01/06/2021 9.9 8.6 - 10.2 mg/dL Final         Passed - Cr in normal range and within 360 days    Creat  Date Value Ref Range Status  03/15/2017 0.98 0.70 - 1.25 mg/dL Final    Comment:      For patients > or = 65 years of age: The upper reference limit for Creatinine is approximately 13% higher for people identified as African-American.      Creatinine, Ser  Date Value Ref Range Status  01/06/2021 0.98 0.76 - 1.27 mg/dL Final         Passed - K in normal range and within 360 days    Potassium  Date Value Ref Range Status  01/06/2021 4.0 3.5 - 5.2 mmol/L Final         Passed - Na in normal range and within 360 days    Sodium  Date Value Ref Range Status  01/06/2021 139 134 - 144 mmol/L Final         Passed - Valid encounter within last 6 months    Recent Outpatient Visits          3 weeks ago Cough   [place], [place], [place]   5 months ago Hyperlipidemia LDL goal <100   [place], Enobong, MD   11 months ago Need for immunization against influenza   [place], Enobong, MD   1 year ago Essential hypertension, benign   [place], [place], [place]   1 year ago Other chronic sinusitis   [place], Enobong, MD      Future Appointments            In 2 months Charlott Rakes, MD [place]           . meloxicam (MOBIC) 7.5 MG tablet 90 tablet 0     Sig: 1 tablet orally daily.     Analgesics:  COX2 Inhibitors Failed - 06/27/2021  2:29 PM      Failed - HGB in normal range and within 360 days    Hemoglobin  Date Value Ref Range Status  03/01/2017 15.0 13.0 - 17.7 g/dL Final         Passed - Cr in normal range and within 360 days    Creat  Date Value Ref Range Status  03/15/2017 0.98 0.70 - 1.25 mg/dL Final    Comment:      For patients > or = 65 years of age: The upper reference limit for Creatinine is approximately 13% higher for people identified as African-American.      Creatinine, Ser  Date Value Ref Range Status  01/06/2021 0.98 0.76 - 1.27 mg/dL Final         Passed - Patient is not pregnant  Passed - Valid encounter within last 12 months    Recent Outpatient Visits          3 weeks ago Cough   [place], [place], [place]   5 months ago Hyperlipidemia LDL goal <100   [place] Charlott Rakes, MD   11 months ago Need for immunization against influenza   [place], Enobong, MD   1 year ago Essential hypertension, benign   [place], [place], [place]   1 year ago Other chronic sinusitis   [place], Enobong, MD      Future Appointments            In 2 months Charlott Rakes, MD [place]           . carvedilol (COREG) 6.25 MG tablet 180 tablet 0    Sig: Take 1 tablet (6.25 mg total) by mouth 2 (two) times daily with a meal.     Cardiovascular:  Beta Blockers Failed - 06/27/2021  2:29 PM      Failed - Last BP in normal range    BP Readings from Last 1 Encounters:  04/28/21 (!) 141/70         Passed - Last Heart Rate in normal range    Pulse Readings from Last 1 Encounters:  04/28/21 67         Passed - Valid encounter within last 6 months    Recent Outpatient Visits           3 weeks ago Cough   [place], [place], [place]   5 months ago Hyperlipidemia LDL goal <100   [place], Enobong, MD   11 months ago Need for immunization against influenza   [place], Enobong, MD   1 year ago Essential hypertension, benign   [place], [place], [place]   1 year ago Other chronic sinusitis   [place], Enobong, MD      Future Appointments            In 2 months Charlott Rakes, MD [place]

## 2021-08-09 ENCOUNTER — Other Ambulatory Visit: Payer: Self-pay | Admitting: Family Medicine

## 2021-08-09 DIAGNOSIS — I10 Essential (primary) hypertension: Secondary | ICD-10-CM

## 2021-08-09 NOTE — Telephone Encounter (Signed)
Requested medications are due for refill today yes  Requested medications are on the active medication list yes  Last refill 05/13/21  Last visit 01/06/21  Future visit scheduled 09/21/21  Notes to clinic rx on current med list does not contain signature/directions, please assess.

## 2021-08-11 ENCOUNTER — Other Ambulatory Visit: Payer: Self-pay | Admitting: Family Medicine

## 2021-08-11 DIAGNOSIS — I10 Essential (primary) hypertension: Secondary | ICD-10-CM

## 2021-08-12 NOTE — Telephone Encounter (Signed)
Requested medication (s) are due for refill today- no  Requested medication (s) are on the active medication list -yes  Future visit scheduled -yes  Last refill: 08/09/21 #30  Notes to clinic: Request 90 day supply- Rx has notes- must keep appointment for future RF(over due follow up appointment)- sent for review of request   Requested Prescriptions  Pending Prescriptions Disp Refills   hydrochlorothiazide (HYDRODIURIL) 25 MG tablet [Pharmacy Med Name: HYDROCHLOROTHIAZIDE 25MG  TABLETS] 90 tablet     Sig: TAKE 1 TABLET(25 MG) BY MOUTH DAILY     Cardiovascular: Diuretics - Thiazide Failed - 08/11/2021  8:54 AM      Failed - Last BP in normal range    BP Readings from Last 1 Encounters:  04/28/21 (!) 141/70          Passed - Ca in normal range and within 360 days    Calcium  Date Value Ref Range Status  01/06/2021 9.9 8.6 - 10.2 mg/dL Final          Passed - Cr in normal range and within 360 days    Creat  Date Value Ref Range Status  03/15/2017 0.98 0.70 - 1.25 mg/dL Final    Comment:      For patients > or = 64 years of age: The upper reference limit for Creatinine is approximately 13% higher for people identified as African-American.      Creatinine, Ser  Date Value Ref Range Status  01/06/2021 0.98 0.76 - 1.27 mg/dL Final          Passed - K in normal range and within 360 days    Potassium  Date Value Ref Range Status  01/06/2021 4.0 3.5 - 5.2 mmol/L Final          Passed - Na in normal range and within 360 days    Sodium  Date Value Ref Range Status  01/06/2021 139 134 - 144 mmol/L Final          Passed - Valid encounter within last 6 months    Recent Outpatient Visits           2 months ago Cough   [place], [place], [place]   7 months ago Hyperlipidemia LDL goal <100   [place] Charlott Rakes, MD   1 year ago Need for immunization against influenza   [place], Enobong, MD   1 year ago Essential hypertension, benign   [place], [place], [place]   1 year ago Other chronic sinusitis   [place], Charlane Ferretti, MD       Future Appointments             In 1 month Charlott Rakes, MD [place]               Requested Prescriptions  Pending Prescriptions Disp Refills   hydrochlorothiazide (HYDRODIURIL) 25 MG tablet [Pharmacy Med Name: HYDROCHLOROTHIAZIDE 25MG  TABLETS] 90 tablet     Sig: TAKE 1 TABLET(25 MG) BY MOUTH DAILY     Cardiovascular: Diuretics - Thiazide Failed - 08/11/2021  8:54 AM      Failed - Last BP in normal range    BP Readings from Last 1 Encounters:  04/28/21 (!) 141/70          Passed - Ca in normal range and within 360  days    Calcium  Date Value Ref Range Status  01/06/2021 9.9 8.6 - 10.2 mg/dL Final          Passed - Cr in normal range and within 360 days    Creat  Date Value Ref Range Status  03/15/2017 0.98 0.70 - 1.25 mg/dL Final    Comment:      For patients > or = 65 years of age: The upper reference limit for Creatinine is approximately 13% higher for people identified as African-American.      Creatinine, Ser  Date Value Ref Range Status  01/06/2021 0.98 0.76 - 1.27 mg/dL Final          Passed - K in normal range and within 360 days    Potassium  Date Value Ref Range Status  01/06/2021 4.0 3.5 - 5.2 mmol/L Final          Passed - Na in normal range and within 360 days    Sodium  Date Value Ref Range Status  01/06/2021 139 134 - 144 mmol/L Final          Passed - Valid encounter within last 6 months    Recent Outpatient Visits           2 months ago Cough   [place], [place], [place]   7 months ago Hyperlipidemia LDL goal <100   [place] Charlott Rakes, MD   1  year ago Need for immunization against influenza   [place], Enobong, MD   1 year ago Essential hypertension, benign   [place], [place], [place]   1 year ago Other chronic sinusitis   [place], MD       Future Appointments             In 1 month Charlott Rakes, MD [place]

## 2021-08-19 ENCOUNTER — Other Ambulatory Visit: Payer: Self-pay | Admitting: Family Medicine

## 2021-08-19 DIAGNOSIS — E785 Hyperlipidemia, unspecified: Secondary | ICD-10-CM

## 2021-08-19 NOTE — Telephone Encounter (Signed)
Requested Prescriptions  Pending Prescriptions Disp Refills  . atorvastatin (LIPITOR) 20 MG tablet [Pharmacy Med Name: ATORVASTATIN 20MG  TABLETS] 90 tablet 0    Sig: TAKE 1 TABLET(20 MG) BY MOUTH DAILY     Cardiovascular:  Antilipid - Statins Failed - 08/19/2021  6:36 AM      Failed - Total Cholesterol in normal range and within 360 days    Cholesterol, Total  Date Value Ref Range Status  07/08/2020 122 100 - 199 mg/dL Final         Failed - LDL in normal range and within 360 days    LDL Chol Calc (NIH)  Date Value Ref Range Status  07/08/2020 60 0 - 99 mg/dL Final         Failed - HDL in normal range and within 360 days    HDL  Date Value Ref Range Status  07/08/2020 52 >39 mg/dL Final         Failed - Triglycerides in normal range and within 360 days    Triglycerides  Date Value Ref Range Status  07/08/2020 37 0 - 149 mg/dL Final         Passed - Patient is not pregnant      Passed - Valid encounter within last 12 months    Recent Outpatient Visits          2 months ago Cough   [place], [place], [place]   7 months ago Hyperlipidemia LDL goal <100   [place], Charlane Ferretti, MD   1 year ago Need for immunization against influenza   [place], Enobong, MD   1 year ago Essential hypertension, benign   [place], [place], [place]   1 year ago Other chronic sinusitis   [place], Enobong, MD      Future Appointments            In 1 month Charlott Rakes, MD [place]

## 2021-08-27 ENCOUNTER — Other Ambulatory Visit: Payer: Self-pay | Admitting: Family Medicine

## 2021-08-27 DIAGNOSIS — J328 Other chronic sinusitis: Secondary | ICD-10-CM

## 2021-08-27 NOTE — Telephone Encounter (Signed)
Requested Prescriptions  Pending Prescriptions Disp Refills  . fluticasone (FLONASE) 50 MCG/ACT nasal spray [Pharmacy Med Name: FLUTICASONE 50MCG NAS SP(120SP) RX] 48 g 0    Sig: SHAKE LIQUID AND USE 2 SPRAYS IN EACH NOSTRIL DAILY     Ear, Nose, and Throat: Nasal Preparations - Corticosteroids Passed - 08/27/2021  3:30 PM      Passed - Valid encounter within last 12 months    Recent Outpatient Visits          2 months ago Cough   [place], [place]   7 months ago Hyperlipidemia LDL goal <100   [place], Enobong, MD   1 year ago Need for immunization against influenza   [place], Enobong, MD   1 year ago Essential hypertension, benign   [place], [place], [place]   1 year ago Other chronic sinusitis   [place], Enobong, MD      Future Appointments            In 3 weeks Charlott Rakes, MD [place]

## 2021-09-18 ENCOUNTER — Other Ambulatory Visit: Payer: Self-pay | Admitting: Physician Assistant

## 2021-09-18 DIAGNOSIS — R059 Cough, unspecified: Secondary | ICD-10-CM

## 2021-09-18 NOTE — Telephone Encounter (Signed)
Requested medication (s) are due for refill today: no  Requested medication (s) are on the active medication list: yes  Last refill:  06/01/21 #30 3 RF  Future visit scheduled: yes 09/21/21  Notes to clinic:  pt due for refills but has OV in 3 days   Requested Prescriptions  Pending Prescriptions Disp Refills   omeprazole (PRILOSEC) 20 MG capsule [Pharmacy Med Name: OMEPRAZOLE 20MG  CAPSULES] 30 capsule 3    Sig: TAKE 1 CAPSULE(20 MG) BY MOUTH DAILY     Gastroenterology: Proton Pump Inhibitors Passed - 09/18/2021  3:09 AM      Passed - Valid encounter within last 12 months    Recent Outpatient Visits           3 months ago Cough   [place], [place], [place]   8 months ago Hyperlipidemia LDL goal <100   [place] Charlott Rakes, MD   1 year ago Need for immunization against influenza   [place], Enobong, MD   1 year ago Essential hypertension, benign   [place], [place], [place]   1 year ago Other chronic sinusitis   [place], Enobong, MD       Future Appointments             In 3 days Charlott Rakes, MD [place]

## 2021-09-19 ENCOUNTER — Other Ambulatory Visit: Payer: Self-pay | Admitting: Family Medicine

## 2021-09-19 DIAGNOSIS — R059 Cough, unspecified: Secondary | ICD-10-CM

## 2021-09-21 ENCOUNTER — Ambulatory Visit: Payer: Medicare Other | Attending: Family Medicine | Admitting: Family Medicine

## 2021-09-21 ENCOUNTER — Other Ambulatory Visit: Payer: Self-pay

## 2021-09-21 ENCOUNTER — Encounter: Payer: Self-pay | Admitting: Family Medicine

## 2021-09-21 VITALS — BP 118/83 | HR 68 | Ht 71.0 in | Wt 225.0 lb

## 2021-09-21 DIAGNOSIS — E785 Hyperlipidemia, unspecified: Secondary | ICD-10-CM | POA: Diagnosis not present

## 2021-09-21 DIAGNOSIS — Z23 Encounter for immunization: Secondary | ICD-10-CM

## 2021-09-21 DIAGNOSIS — M19042 Primary osteoarthritis, left hand: Secondary | ICD-10-CM | POA: Diagnosis not present

## 2021-09-21 DIAGNOSIS — I10 Essential (primary) hypertension: Secondary | ICD-10-CM | POA: Diagnosis not present

## 2021-09-21 DIAGNOSIS — K219 Gastro-esophageal reflux disease without esophagitis: Secondary | ICD-10-CM

## 2021-09-21 DIAGNOSIS — M19041 Primary osteoarthritis, right hand: Secondary | ICD-10-CM

## 2021-09-21 DIAGNOSIS — Z1211 Encounter for screening for malignant neoplasm of colon: Secondary | ICD-10-CM | POA: Diagnosis not present

## 2021-09-21 MED ORDER — AMLODIPINE BESYLATE 10 MG PO TABS
ORAL_TABLET | ORAL | 1 refills | Status: DC
Start: 2021-09-21 — End: 2022-04-26

## 2021-09-21 MED ORDER — OMEPRAZOLE 20 MG PO CPDR: 20.0000 mg | DELAYED_RELEASE_CAPSULE | Freq: Every day | ORAL | 1 refills | Status: DC

## 2021-09-21 MED ORDER — MELOXICAM 15 MG PO TABS: ORAL_TABLET | ORAL | 1 refills | Status: DC

## 2021-09-21 MED ORDER — ATORVASTATIN CALCIUM 20 MG PO TABS: 20.0000 mg | ORAL_TABLET | Freq: Every day | ORAL | 1 refills | Status: DC

## 2021-09-21 MED ORDER — CARVEDILOL 6.25 MG PO TABS: 6.2500 mg | ORAL_TABLET | Freq: Two times a day (BID) | ORAL | 1 refills | Status: DC

## 2021-09-21 MED ORDER — HYDROCHLOROTHIAZIDE 25 MG PO TABS: ORAL_TABLET | ORAL | 1 refills | Status: DC

## 2021-09-21 NOTE — Progress Notes (Signed)
Subjective:  Patient ID: Dustin [name], male    DOB: [birth year]  Age: 65 y.o. MRN: [id number]  CC: Hypertension   HPI Dustin [name] is a 65 y.o. year old male with a history of hypertension, hypercholesterolemia, osteoarthritis of the hands who presents today for follow-up visit.  Interval History: He complains Meloxicam is not helping with the pain in his hands.  He sometimes has to double up on his meloxicam.  In the past he received injections in his hands with some relief. Compliant with his antihypertensive and his statin and denies adverse effects from his medications. He has no additional concerns today. Past Medical History:  Diagnosis Date   Arthritis    bil knees, left hand   Carcinoid tumor    DVT of upper extremity (deep vein thrombosis) (HCC)    left brachial vein, 06/5620   Helicobacter pylori gastritis    History of blood clots    to left arm   Hypertension    Shortness of breath dyspnea    with exertion   Tubular adenoma of colon 2017    Past Surgical History:  Procedure Laterality Date   colonscopy      1 year ago    KNEE ARTHROPLASTY Right 04/30/2015   Procedure: COMPUTER ASSISTED TOTAL KNEE ARTHROPLASTY;  Surgeon: Marybelle Killings, MD;  Location: [facility];  Service: Orthopedics;  Laterality: Right;   Left  Hand   2011   cyst removed.   TOTAL KNEE ARTHROPLASTY  10/19/2011   Procedure: TOTAL KNEE ARTHROPLASTY;  Surgeon: Sharmon Revere;  Location: [facility];  Service: Orthopedics;  Laterality: Left;    Family History  Problem Relation Age of Onset   Cancer Mother    Cancer Father        prostate cancer   Diabetes Other    Hypertension Other    Diabetes Maternal Uncle    Anesthesia problems Neg Hx    Hypotension Neg Hx    Malignant hyperthermia Neg Hx    Pseudochol deficiency Neg Hx     No Known Allergies  Outpatient Medications Prior to Visit  Medication Sig Dispense Refill   albuterol (VENTOLIN HFA) 108 (90 Base) MCG/ACT inhaler Inhale 2 puffs into  the lungs every 6 (six) hours as needed for wheezing or shortness of breath. 18 g 2   ASPIRIN 81 PO Take by mouth.     aspirin EC 325 MG tablet Take 1 tablet (325 mg total) by mouth daily. 30 tablet 0   cetirizine (ZYRTEC) 10 MG tablet TAKE 1 TABLET(10 MG) BY MOUTH DAILY 90 tablet 0   diclofenac Sodium (VOLTAREN) 1 % GEL APPLY 4 GRAMS EXTERNALLY TO THE AFFECTED AREA FOUR TIMES DAILY 100 g 2   fluticasone (FLONASE) 50 MCG/ACT nasal spray SHAKE LIQUID AND USE 2 SPRAYS IN EACH NOSTRIL DAILY 48 g 0   ondansetron (ZOFRAN) 4 MG tablet Take 1 tablet (4 mg total) by mouth every 8 (eight) hours as needed for up to 10 doses for nausea or vomiting. 10 tablet 0   vitamin E 180 MG (400 UNITS) capsule Take 800 Units by mouth daily.     amLODipine (NORVASC) 10 MG tablet TAKE 1 TABLET(10 MG) BY MOUTH DAILY 90 tablet 1   atorvastatin (LIPITOR) 20 MG tablet TAKE 1 TABLET(20 MG) BY MOUTH DAILY 90 tablet 0   carvedilol (COREG) 6.25 MG tablet Take 1 tablet (6.25 mg total) by mouth 2 (two) times daily with a meal. 180 tablet 0  hydrochlorothiazide (HYDRODIURIL) 25 MG tablet TAKE 1 TABLET(25 MG) BY MOUTH DAILY 30 tablet 0   meloxicam (MOBIC) 7.5 MG tablet 1 tablet orally daily. 90 tablet 0   omeprazole (PRILOSEC) 20 MG capsule TAKE 1 CAPSULE(20 MG) BY MOUTH DAILY 30 capsule 0   No facility-administered medications prior to visit.     ROS Review of Systems  Constitutional:  Negative for activity change and appetite change.  HENT:  Negative for sinus pressure and sore throat.   Eyes:  Negative for visual disturbance.  Respiratory:  Negative for cough, chest tightness and shortness of breath.   Cardiovascular:  Negative for chest pain and leg swelling.  Gastrointestinal:  Negative for abdominal distention, abdominal pain, constipation and diarrhea.  Endocrine: Negative.   Genitourinary:  Negative for dysuria.  Musculoskeletal:        See HPI  Skin:  Negative for rash.  Allergic/Immunologic: Negative.    Neurological:  Negative for weakness, light-headedness and numbness.  Psychiatric/Behavioral:  Negative for dysphoric mood and suicidal ideas.   Objective:  BP 118/83   Pulse 68   Ht 5' 11"  (1.803 m)   Wt 225 lb (102.1 kg)   SpO2 97%   BMI 31.38 kg/m   BP/Weight 09/21/2021 04/28/2021 01/20/4009  Systolic BP 272 536 644  Diastolic BP 83 70 034  Wt. (Lbs) 225 220 220  BMI 31.38 30.68 30.68      Physical Exam Constitutional:      Appearance: He is well-developed.  Cardiovascular:     Rate and Rhythm: Normal rate.     Heart sounds: Normal heart sounds. No murmur heard. Pulmonary:     Effort: Pulmonary effort is normal.     Breath sounds: Normal breath sounds. No wheezing or rales.  Chest:     Chest wall: No tenderness.  Abdominal:     General: Bowel sounds are normal. There is no distension.     Palpations: Abdomen is soft. There is no mass.     Tenderness: There is no abdominal tenderness.  Musculoskeletal:        General: Normal range of motion.     Right lower leg: No edema.     Left lower leg: No edema.     Comments: See HPI  Neurological:     Mental Status: He is alert and oriented to person, place, and time.  Psychiatric:        Mood and Affect: Mood normal.    CMP Latest Ref Rng & Units 01/06/2021 07/08/2020 09/23/2019  Glucose 65 - 99 mg/dL 111(H) 102(H) 96  BUN 8 - 27 mg/dL 14 15 19   Creatinine 0.76 - 1.27 mg/dL 0.98 0.87 0.99  Sodium 134 - 144 mmol/L 139 138 140  Potassium 3.5 - 5.2 mmol/L 4.0 3.7 3.3(L)  Chloride 96 - 106 mmol/L 101 98 101  CO2 20 - 29 mmol/L 24 25 23   Calcium 8.6 - 10.2 mg/dL 9.9 9.9 10.3(H)  Total Protein 6.0 - 8.5 g/dL - 8.4 8.3  Total Bilirubin 0.0 - 1.2 mg/dL - 0.8 1.5(H)  Alkaline Phos 48 - 121 IU/L - 79 71  AST 0 - 40 IU/L - 24 32  ALT 0 - 44 IU/L - 30 26    Lipid Panel     Component Value Date/Time   CHOL 122 07/08/2020 0904   TRIG 37 07/08/2020 0904   HDL 52 07/08/2020 0904   CHOLHDL 2.3 07/08/2020 0904   CHOLHDL 3.1  04/01/2014 1226   VLDL 9 04/01/2014 1226  LDLCALC 60 07/08/2020 0904    CBC    Component Value Date/Time   WBC 4.1 03/01/2017 0914   WBC 3.9 (L) 11/18/2015 1016   RBC 4.97 03/01/2017 0914   RBC 4.88 11/18/2015 1016   HGB 15.0 03/01/2017 0914   HCT 42.3 03/01/2017 0914   PLT 229 03/01/2017 0914   MCV 85 03/01/2017 0914   MCH 30.2 03/01/2017 0914   MCH 29.3 11/18/2015 1016   MCHC 35.5 03/01/2017 0914   MCHC 34.2 11/18/2015 1016   RDW 14.2 03/01/2017 0914   LYMPHSABS 1.2 10/03/2014 1126   MONOABS 0.6 10/03/2014 1126   EOSABS 0.0 10/03/2014 1126   BASOSABS 0.0 10/03/2014 1126    Lab Results  Component Value Date   HGBA1C 5.40 11/18/2015    Assessment & Plan:  1. Essential hypertension, benign Controlled Continue current regimen Counseled on blood pressure goal of less than 130/80, low-sodium, DASH diet, medication compliance, 150 minutes of moderate intensity exercise per week. Discussed medication compliance, adverse effects. - LP+Non-HDL Cholesterol - CMP14+EGFR - amLODipine (NORVASC) 10 MG tablet; TAKE 1 TABLET(10 MG) BY MOUTH DAILY  Dispense: 90 tablet; Refill: 1 - carvedilol (COREG) 6.25 MG tablet; Take 1 tablet (6.25 mg total) by mouth 2 (two) times daily with a meal.  Dispense: 180 tablet; Refill: 1 - hydrochlorothiazide (HYDRODIURIL) 25 MG tablet; TAKE 1 TABLET(25 MG) BY MOUTH DAILY  Dispense: 90 tablet; Refill: 1  2. Hyperlipidemia LDL goal <100 Controlled Low-cholesterol diet Continue statin - atorvastatin (LIPITOR) 20 MG tablet; Take 1 tablet (20 mg total) by mouth daily.  Dispense: 90 tablet; Refill: 1  3. Gastroesophageal reflux disease without esophagitis Controlled - omeprazole (PRILOSEC) 20 MG capsule; Take 1 capsule (20 mg total) by mouth daily.  Dispense: 90 capsule; Refill: 1  4. Primary osteoarthritis of both hands Uncontrolled Increased meloxicam dose If symptoms persist he has been advised to call the clinic and I will place a referral to  orthopedic for him - meloxicam (MOBIC) 15 MG tablet; 1 tablet orally daily.  Dispense: 90 tablet; Refill: 1  5. Screening for colon cancer - Ambulatory referral to Gastroenterology  6. Need for immunization against influenza He was scheduled to receive the flu shot today and pneumonia vaccine today I was informed by the nurse that her student working with her administered a regular flu vaccine and high-dose flu vaccine as well to the patient instead of a high-dose flu vaccine and PCV 20 as I ordered. I reported case to poison control who obtained patient information and advised that the patient might develop flulike symptoms but nothing additional needs to be done.  I called Mr. Rodda on the phone and apologized for this medication error and provided recommendations for treatment of flulike symptoms.  He is to notify me if he develops any additional symptoms.  Safety zone portal created and nursing student has been educated. - Flu Vaccine QUAD 69moIM (Fluarix, Fluzone & Alfiuria Quad PF)     Meds ordered this encounter  Medications   amLODipine (NORVASC) 10 MG tablet    Sig: TAKE 1 TABLET(10 MG) BY MOUTH DAILY    Dispense:  90 tablet    Refill:  1   atorvastatin (LIPITOR) 20 MG tablet    Sig: Take 1 tablet (20 mg total) by mouth daily.    Dispense:  90 tablet    Refill:  1   carvedilol (COREG) 6.25 MG tablet    Sig: Take 1 tablet (6.25 mg total) by mouth 2 (two) times  daily with a meal.    Dispense:  180 tablet    Refill:  1   hydrochlorothiazide (HYDRODIURIL) 25 MG tablet    Sig: TAKE 1 TABLET(25 MG) BY MOUTH DAILY    Dispense:  90 tablet    Refill:  1   omeprazole (PRILOSEC) 20 MG capsule    Sig: Take 1 capsule (20 mg total) by mouth daily.    Dispense:  90 capsule    Refill:  1   meloxicam (MOBIC) 15 MG tablet    Sig: 1 tablet orally daily.    Dispense:  90 tablet    Refill:  1    Dose increase    Follow-up: Return in about 1 week (around 09/28/2021) for Shingles  vaccine with Lurena Joiner; 6 months with PCP.       Charlott Rakes, MD, FAAFP. [facility] [place], [place]   09/21/2021, 11:11 AM

## 2021-09-22 LAB — CMP14+EGFR
ALT: 26 IU/L (ref 0–44)
AST: 33 IU/L (ref 0–40)
Albumin/Globulin Ratio: 1.6 (ref 1.2–2.2)
Albumin: 4.9 g/dL — ABNORMAL HIGH (ref 3.8–4.8)
Alkaline Phosphatase: 68 IU/L (ref 44–121)
BUN/Creatinine Ratio: 16 (ref 10–24)
BUN: 16 mg/dL (ref 8–27)
Bilirubin Total: 1.2 mg/dL (ref 0.0–1.2)
CO2: 28 mmol/L (ref 20–29)
Calcium: 10.1 mg/dL (ref 8.6–10.2)
Chloride: 99 mmol/L (ref 96–106)
Creatinine, Ser: 1.01 mg/dL (ref 0.76–1.27)
Globulin, Total: 3.1 g/dL (ref 1.5–4.5)
Glucose: 107 mg/dL — ABNORMAL HIGH (ref 70–99)
Potassium: 4.3 mmol/L (ref 3.5–5.2)
Sodium: 139 mmol/L (ref 134–144)
Total Protein: 8 g/dL (ref 6.0–8.5)
eGFR: 83 mL/min/{1.73_m2} (ref >59)

## 2021-09-22 LAB — LP+NON-HDL CHOLESTEROL
Cholesterol, Total: 138 mg/dL (ref 100–199)
HDL: 51 mg/dL (ref >39)
LDL Chol Calc (NIH): 77 mg/dL (ref 0–99)
Total Non-HDL-Chol (LDL+VLDL): 87 mg/dL (ref 0–129)
Triglycerides: 41 mg/dL (ref 0–149)
VLDL Cholesterol Cal: 10 mg/dL (ref 5–40)

## 2021-09-25 ENCOUNTER — Other Ambulatory Visit: Payer: Self-pay | Admitting: Family Medicine

## 2021-09-25 DIAGNOSIS — M19041 Primary osteoarthritis, right hand: Secondary | ICD-10-CM

## 2021-09-25 DIAGNOSIS — M19042 Primary osteoarthritis, left hand: Secondary | ICD-10-CM

## 2021-09-26 NOTE — Telephone Encounter (Signed)
Requested medications are due for refill today NO  Requested medications are on the active medication list NO dose inconsistent  Last visit 09/21/21  Future visit scheduled 03/21/22  Notes to clinic  Dose inconsistent with current med list, please assess. Requested Prescriptions  Pending Prescriptions Disp Refills   meloxicam (MOBIC) 7.5 MG tablet [Pharmacy Med Name: MELOXICAM 7.5MG  TABLETS] 90 tablet 0    Sig: TAKE 1 TABLET BY MOUTH DAILY     Analgesics:  COX2 Inhibitors Failed - 09/25/2021 11:11 AM      Failed - HGB in normal range and within 360 days    Hemoglobin  Date Value Ref Range Status  03/01/2017 15.0 13.0 - 17.7 g/dL Final          Passed - Cr in normal range and within 360 days    Creat  Date Value Ref Range Status  03/15/2017 0.98 0.70 - 1.25 mg/dL Final    Comment:      For patients > or = 65 years of age: The upper reference limit for Creatinine is approximately 13% higher for people identified as African-American.      Creatinine, Ser  Date Value Ref Range Status  09/21/2021 1.01 0.76 - 1.27 mg/dL Final          Passed - Patient is not pregnant      Passed - Valid encounter within last 12 months    Recent Outpatient Visits           5 days ago Screening for colon cancer   [place], Charlane Ferretti, MD   3 months ago Cough   [place], [place], [place]   8 months ago Hyperlipidemia LDL goal <100   [place] Charlott Rakes, MD   1 year ago Need for immunization against influenza   [place], Enobong, MD   1 year ago Essential hypertension, benign   [place], [place], [place]       Future Appointments             In 2 weeks [place], [place], [place]   In 5 months Charlott Rakes, MD [place]

## 2021-10-05 ENCOUNTER — Other Ambulatory Visit: Payer: Self-pay | Admitting: Family Medicine

## 2021-10-05 DIAGNOSIS — R059 Cough, unspecified: Secondary | ICD-10-CM

## 2021-10-05 NOTE — Telephone Encounter (Signed)
Requested Prescriptions  Pending Prescriptions Disp Refills  . albuterol (VENTOLIN HFA) 108 (90 Base) MCG/ACT inhaler [Pharmacy Med Name: ALBUTEROL HFA INH (200 PUFFS) 6.7GM] 6.7 g 0    Sig: INHALE 2 PUFFS INTO THE LUNGS EVERY 6 HOURS AS NEEDED FOR WHEEZING OR SHORTNESS OF BREATH     Pulmonology:  Beta Agonists Failed - 10/05/2021 10:35 AM      Failed - One inhaler should last at least one month. If the patient is requesting refills earlier, contact the patient to check for uncontrolled symptoms.      Passed - Valid encounter within last 12 months    Recent Outpatient Visits          2 weeks ago Screening for colon cancer   [place], Enobong, MD   4 months ago Cough   [place], [place]   9 months ago Hyperlipidemia LDL goal <100   [place] Charlott Rakes, MD   1 year ago Need for immunization against influenza   [place], Enobong, MD   1 year ago Essential hypertension, benign   [place], Dionne [place], [place]      Future Appointments            In 5 days Daisy [place], Jarome Matin, [place]   In 5 months Charlott Rakes, MD [place]

## 2021-10-07 ENCOUNTER — Other Ambulatory Visit: Payer: Self-pay | Admitting: Family Medicine

## 2021-10-07 DIAGNOSIS — R059 Cough, unspecified: Secondary | ICD-10-CM

## 2021-10-10 ENCOUNTER — Ambulatory Visit: Payer: Medicare Other | Attending: Family Medicine | Admitting: Pharmacist

## 2021-10-10 ENCOUNTER — Other Ambulatory Visit: Payer: Self-pay

## 2021-10-10 DIAGNOSIS — Z9229 Personal history of other drug therapy: Secondary | ICD-10-CM

## 2021-10-10 NOTE — Progress Notes (Signed)
Patient presents for vaccination against Shingrix per orders of Dr. Margarita Rana. He brings with him records today from [facility]. Apparently, he received his second Shingrix vaccination in May of this year. First dose was in 2019. Per CDC guidelines, it is not recommended to revaccinate at this time. I have updated his health maintenance in [facility].  Benard Halsted, PharmD, [place], [place] [phone]

## 2021-10-23 ENCOUNTER — Other Ambulatory Visit: Payer: Self-pay | Admitting: Family Medicine

## 2021-10-23 DIAGNOSIS — I10 Essential (primary) hypertension: Secondary | ICD-10-CM

## 2021-10-24 ENCOUNTER — Other Ambulatory Visit: Payer: Self-pay | Admitting: Family Medicine

## 2021-10-24 DIAGNOSIS — K219 Gastro-esophageal reflux disease without esophagitis: Secondary | ICD-10-CM

## 2021-10-25 NOTE — Telephone Encounter (Signed)
Requested medication (s) are due for refill today: no  Requested medication (s) are on the active medication list: yes  Last refill:  09/21/21 90 with 1 refill  Future visit scheduled: yes  Notes to clinic: Patient is requesting refill to soon       Requested Prescriptions  Pending Prescriptions Disp Refills   amLODipine (NORVASC) 10 MG tablet [Pharmacy Med Name: AMLODIPINE BESYLATE 10MG  TABLETS] 90 tablet 1    Sig: TAKE 1 TABLET(10 MG) BY MOUTH DAILY     Cardiovascular:  Calcium Channel Blockers Passed - 10/23/2021  3:11 AM      Passed - Last BP in normal range    BP Readings from Last 1 Encounters:  09/21/21 118/83          Passed - Valid encounter within last 6 months    Recent Outpatient Visits           2 weeks ago History of herpes zoster virus vaccination   [place], Jarome Matin, RPH-CPP   1 month ago Screening for colon cancer   [place], Enobong, MD   4 months ago Cough   [place], [place], [place]   9 months ago Hyperlipidemia LDL goal <100   [facility] Charlott Rakes, MD   1 year ago Need for immunization against influenza   [place], MD       Future Appointments             In 4 months Charlott Rakes, MD [place]

## 2021-10-27 ENCOUNTER — Other Ambulatory Visit: Payer: Self-pay | Admitting: Family Medicine

## 2021-10-27 DIAGNOSIS — R059 Cough, unspecified: Secondary | ICD-10-CM

## 2021-10-28 MED ORDER — ALBUTEROL SULFATE HFA 108 (90 BASE) MCG/ACT IN AERS: 2.0000 | INHALATION_SPRAY | Freq: Four times a day (QID) | RESPIRATORY_TRACT | 0 refills | Status: DC | PRN

## 2021-11-03 ENCOUNTER — Encounter: Payer: Self-pay | Admitting: Gastroenterology

## 2021-11-21 ENCOUNTER — Other Ambulatory Visit: Payer: Self-pay | Admitting: Family Medicine

## 2021-11-21 DIAGNOSIS — J328 Other chronic sinusitis: Secondary | ICD-10-CM

## 2021-11-21 NOTE — Telephone Encounter (Signed)
Requested Prescriptions  Pending Prescriptions Disp Refills   fluticasone (FLONASE) 50 MCG/ACT nasal spray [Pharmacy Med Name: FLUTICASONE 50MCG NAS SP(120SP) RX] 48 g 0    Sig: SHAKE LIQUID AND USE 2 SPRAYS IN EACH NOSTRIL DAILY     Ear, Nose, and Throat: Nasal Preparations - Corticosteroids Passed - 11/21/2021 10:30 AM      Passed - Valid encounter within last 12 months    Recent Outpatient Visits          1 month ago History of herpes zoster virus vaccination   [place], Jarome Matin, RPH-CPP   2 months ago Screening for colon cancer   [place], Enobong, MD   5 months ago Cough   [place], [place]   10 months ago Hyperlipidemia LDL goal <100   [place], Enobong, MD   1 year ago Need for immunization against influenza   [place], MD      Future Appointments            In 4 months Charlott Rakes, MD [place]

## 2022-02-03 ENCOUNTER — Other Ambulatory Visit: Payer: Self-pay | Admitting: Family Medicine

## 2022-02-03 DIAGNOSIS — I10 Essential (primary) hypertension: Secondary | ICD-10-CM

## 2022-02-03 NOTE — Telephone Encounter (Signed)
Requested Prescriptions  ?Pending Prescriptions Disp Refills  ?? hydrochlorothiazide (HYDRODIURIL) 25 MG tablet [Pharmacy Med Name: HYDROCHLOROTHIAZIDE '25MG'$ TABLETS] 90 tablet 0  ?  Sig: TAKE 1 TABLET(25 MG) BY MOUTH DAILY  ?  ? Cardiovascular: Diuretics - Thiazide Passed - 02/03/2022  8:33 AM  ?  ?  Passed - Cr in normal range and within 180 days  ?  Creat  ?Date Value Ref Range Status  ?03/15/2017 0.98 0.70 - 1.25 mg/dL Final  ?  Comment:  ?    ?For patients > or = 66 years of age: The upper reference limit for ?Creatinine is approximately 13% higher for people identified as ?African-American. ?  ?  ? ?Creatinine, Ser  ?Date Value Ref Range Status  ?09/21/2021 1.01 0.76 - 1.27 mg/dL Final  ?   ?  ?  Passed - K in normal range and within 180 days  ?  Potassium  ?Date Value Ref Range Status  ?09/21/2021 4.3 3.5 - 5.2 mmol/L Final  ?   ?  ?  Passed - Na in normal range and within 180 days  ?  Sodium  ?Date Value Ref Range Status  ?09/21/2021 139 134 - 144 mmol/L Final  ?   ?  ?  Passed - Last BP in normal range  ?  BP Readings from Last 1 Encounters:  ?09/21/21 118/83  ?   ?  ?  Passed - Valid encounter within last 6 months  ?  Recent Outpatient Visits   ?      ? 3 months ago History of herpes zoster virus vaccination  ? [place], RPH-CPP  ? 4 months ago Screening for colon cancer  ? [place], Charlane Ferretti, MD  ? 8 months ago Cough  ? [place], [place], [place]  ? 1 year ago Hyperlipidemia LDL goal <100  ? [place], MD  ? 1 year ago Need for immunization against influenza  ? [place] Charlott Rakes, MD  ?  ?  ?Future Appointments   ?        ? In 1 month Charlott Rakes, MD [place]  ?  ? ?  ?  ?  ? ? ?

## 2022-02-15 ENCOUNTER — Other Ambulatory Visit: Payer: Self-pay | Admitting: Family Medicine

## 2022-02-15 DIAGNOSIS — J328 Other chronic sinusitis: Secondary | ICD-10-CM

## 2022-03-04 ENCOUNTER — Other Ambulatory Visit: Payer: Self-pay | Admitting: Family Medicine

## 2022-03-04 DIAGNOSIS — M19042 Primary osteoarthritis, left hand: Secondary | ICD-10-CM

## 2022-03-04 DIAGNOSIS — M19041 Primary osteoarthritis, right hand: Secondary | ICD-10-CM

## 2022-03-18 ENCOUNTER — Other Ambulatory Visit: Payer: Self-pay | Admitting: Family Medicine

## 2022-03-18 DIAGNOSIS — R059 Cough, unspecified: Secondary | ICD-10-CM

## 2022-03-21 ENCOUNTER — Ambulatory Visit: Payer: Medicare Other | Admitting: Family Medicine

## 2022-04-03 ENCOUNTER — Other Ambulatory Visit (HOSPITAL_COMMUNITY): Payer: Self-pay

## 2022-04-14 ENCOUNTER — Other Ambulatory Visit: Payer: Self-pay | Admitting: Family Medicine

## 2022-04-14 DIAGNOSIS — K219 Gastro-esophageal reflux disease without esophagitis: Secondary | ICD-10-CM

## 2022-04-16 ENCOUNTER — Other Ambulatory Visit: Payer: Self-pay | Admitting: Family Medicine

## 2022-04-16 DIAGNOSIS — I10 Essential (primary) hypertension: Secondary | ICD-10-CM

## 2022-04-26 ENCOUNTER — Other Ambulatory Visit: Payer: Self-pay | Admitting: Family Medicine

## 2022-04-26 DIAGNOSIS — I10 Essential (primary) hypertension: Secondary | ICD-10-CM

## 2022-04-26 NOTE — Telephone Encounter (Signed)
Patient has future OV scheduled, will refill medication.  Requested Prescriptions  Pending Prescriptions Disp Refills  . amLODipine (NORVASC) 10 MG tablet [Pharmacy Med Name: AMLODIPINE BESYLATE '10MG'$  TABLETS] 90 tablet 1    Sig: TAKE 1 TABLET(10 MG) BY MOUTH DAILY     Cardiovascular: Calcium Channel Blockers 2 Failed - 04/26/2022  6:48 AM      Failed - Valid encounter within last 6 months    Recent Outpatient Visits          6 months ago History of herpes zoster virus vaccination   [place], Jarome Matin, RPH-CPP   7 months ago Screening for colon cancer   [place] Charlott Rakes, MD   10 months ago Cough   [place], [place], [place]   1 year ago Hyperlipidemia LDL goal <100   [place], Enobong, MD   1 year ago Need for immunization against influenza   [place], Enobong, MD      Future Appointments            In 1 month Charlott Rakes, MD [place] BP in normal range    BP Readings from Last 1 Encounters:  09/21/21 118/83         Passed - Last Heart Rate in normal range    Pulse Readings from Last 1 Encounters:  09/21/21 68

## 2022-05-13 ENCOUNTER — Other Ambulatory Visit: Payer: Self-pay | Admitting: Family Medicine

## 2022-05-13 DIAGNOSIS — J328 Other chronic sinusitis: Secondary | ICD-10-CM

## 2022-05-15 NOTE — Telephone Encounter (Signed)
Requested Prescriptions  Pending Prescriptions Disp Refills  . fluticasone (FLONASE) 50 MCG/ACT nasal spray [Pharmacy Med Name: FLUTICASONE 50MCG NASAL SP (120) RX] 48 g 0    Sig: SHAKE LIQUID AND USE 2 SPRAYS IN EACH NOSTRIL DAILY     Ear, Nose, and Throat: Nasal Preparations - Corticosteroids Passed - 05/13/2022 10:25 AM      Passed - Valid encounter within last 12 months    Recent Outpatient Visits          7 months ago History of herpes zoster virus vaccination   [place], Jarome Matin, RPH-CPP   7 months ago Screening for colon cancer   [place], Enobong, MD   11 months ago Cough   [place], [place], [place]   1 year ago Hyperlipidemia LDL goal <100   [place], Enobong, MD   1 year ago Need for immunization against influenza   [place], MD      Future Appointments            In 3 weeks Charlott Rakes, MD [place]

## 2022-05-16 ENCOUNTER — Other Ambulatory Visit: Payer: Self-pay | Admitting: Family Medicine

## 2022-05-16 DIAGNOSIS — E785 Hyperlipidemia, unspecified: Secondary | ICD-10-CM

## 2022-05-17 NOTE — Telephone Encounter (Signed)
Requested Prescriptions  Pending Prescriptions Disp Refills  . atorvastatin (LIPITOR) 20 MG tablet [Pharmacy Med Name: ATORVASTATIN '20MG'$  TABLETS] 90 tablet 0    Sig: TAKE 1 TABLET(20 MG) BY MOUTH DAILY     Cardiovascular:  Antilipid - Statins Failed - 05/16/2022  6:39 AM      Failed - Lipid Panel in normal range within the last 12 months    Cholesterol, Total  Date Value Ref Range Status  09/21/2021 138 100 - 199 mg/dL Final   LDL Chol Calc (NIH)  Date Value Ref Range Status  09/21/2021 77 0 - 99 mg/dL Final   HDL  Date Value Ref Range Status  09/21/2021 51 >39 mg/dL Final   Triglycerides  Date Value Ref Range Status  09/21/2021 41 0 - 149 mg/dL Final         Passed - Patient is not pregnant      Passed - Valid encounter within last 12 months    Recent Outpatient Visits          7 months ago History of herpes zoster virus vaccination   [place], Jarome Matin, RPH-CPP   7 months ago Screening for colon cancer   [place], Charlane Ferretti, MD   11 months ago Cough   [place], [place], [place]   1 year ago Hyperlipidemia LDL goal <100   [facility] Charlott Rakes, MD   1 year ago Need for immunization against influenza   [place], Enobong, MD      Future Appointments            In 3 weeks Charlott Rakes, MD [place]

## 2022-05-30 ENCOUNTER — Other Ambulatory Visit: Payer: Self-pay | Admitting: Family Medicine

## 2022-05-30 DIAGNOSIS — R059 Cough, unspecified: Secondary | ICD-10-CM

## 2022-06-01 ENCOUNTER — Other Ambulatory Visit: Payer: Self-pay | Admitting: Family Medicine

## 2022-06-01 DIAGNOSIS — M19041 Primary osteoarthritis, right hand: Secondary | ICD-10-CM

## 2022-06-07 ENCOUNTER — Ambulatory Visit: Payer: Medicare Other | Attending: Family Medicine | Admitting: Family Medicine

## 2022-06-07 ENCOUNTER — Encounter: Payer: Self-pay | Admitting: Family Medicine

## 2022-06-07 VITALS — BP 128/60 | HR 73 | Temp 97.5°F | Ht 71.0 in | Wt 214.4 lb

## 2022-06-07 DIAGNOSIS — J328 Other chronic sinusitis: Secondary | ICD-10-CM

## 2022-06-07 DIAGNOSIS — Z1211 Encounter for screening for malignant neoplasm of colon: Secondary | ICD-10-CM | POA: Diagnosis not present

## 2022-06-07 DIAGNOSIS — R053 Chronic cough: Secondary | ICD-10-CM

## 2022-06-07 DIAGNOSIS — E785 Hyperlipidemia, unspecified: Secondary | ICD-10-CM | POA: Diagnosis not present

## 2022-06-07 DIAGNOSIS — I10 Essential (primary) hypertension: Secondary | ICD-10-CM

## 2022-06-07 DIAGNOSIS — Z131 Encounter for screening for diabetes mellitus: Secondary | ICD-10-CM

## 2022-06-07 DIAGNOSIS — Z23 Encounter for immunization: Secondary | ICD-10-CM | POA: Diagnosis not present

## 2022-06-07 DIAGNOSIS — K219 Gastro-esophageal reflux disease without esophagitis: Secondary | ICD-10-CM | POA: Diagnosis not present

## 2022-06-07 DIAGNOSIS — E119 Type 2 diabetes mellitus without complications: Secondary | ICD-10-CM | POA: Diagnosis not present

## 2022-06-07 MED ORDER — ATORVASTATIN CALCIUM 20 MG PO TABS: ORAL_TABLET | ORAL | 1 refills | Status: DC

## 2022-06-07 MED ORDER — CARVEDILOL 6.25 MG PO TABS: ORAL_TABLET | ORAL | 1 refills | Status: DC

## 2022-06-07 MED ORDER — ALBUTEROL SULFATE HFA 108 (90 BASE) MCG/ACT IN AERS: 2.0000 | INHALATION_SPRAY | Freq: Four times a day (QID) | RESPIRATORY_TRACT | 0 refills | Status: DC | PRN

## 2022-06-07 MED ORDER — FLUTICASONE PROPIONATE 50 MCG/ACT NA SUSP: 1.0000 | Freq: Every day | NASAL | 1 refills | Status: DC

## 2022-06-07 MED ORDER — OMEPRAZOLE 20 MG PO CPDR: DELAYED_RELEASE_CAPSULE | ORAL | 1 refills | Status: DC

## 2022-06-07 MED ORDER — AMLODIPINE BESYLATE 10 MG PO TABS: 10.0000 mg | ORAL_TABLET | Freq: Every day | ORAL | 1 refills | Status: DC

## 2022-06-07 MED ORDER — HYDROCHLOROTHIAZIDE 25 MG PO TABS: 25.0000 mg | ORAL_TABLET | Freq: Every day | ORAL | 1 refills | Status: DC

## 2022-06-07 NOTE — Progress Notes (Signed)
Subjective:  Patient ID: Dustin [name], male    DOB: 1956-06-03  Age: 66 y.o. MRN: [id number]  CC: Hypertension   HPI Dustin [name] is a 66 y.o. year old male with a history of hypertension, hypercholesterolemia, osteoarthritis of the hands who presents today for follow-up visit.  Interval History: He endorses adherence with his antihypertensive and his statin.  States he does not always eat right as he endorses eating canned foods.  He walks on some occasions for exercise. Osteoarthritis of the hands are controlled on his NSAIDs Denies additional concerns today.  Past Medical History:  Diagnosis Date   Arthritis    bil knees, left hand   Carcinoid tumor    DVT of upper extremity (deep vein thrombosis) (HCC)    left brachial vein, 06/1637   Helicobacter pylori gastritis    History of blood clots    to left arm   Hypertension    Shortness of breath dyspnea    with exertion   Tubular adenoma of colon 2017    Past Surgical History:  Procedure Laterality Date   colonscopy      1 year ago    KNEE ARTHROPLASTY Right 04/30/2015   Procedure: COMPUTER ASSISTED TOTAL KNEE ARTHROPLASTY;  Surgeon: Marybelle Killings, MD;  Location: [place];  Service: Orthopedics;  Laterality: Right;   Left  Hand   2011   cyst removed.   TOTAL KNEE ARTHROPLASTY  10/19/2011   Procedure: TOTAL KNEE ARTHROPLASTY;  Surgeon: Sharmon Revere;  Location: [place];  Service: Orthopedics;  Laterality: Left;    Family History  Problem Relation Age of Onset   Cancer Mother    Cancer Father        prostate cancer   Diabetes Other    Hypertension Other    Diabetes Maternal Uncle    Anesthesia problems Neg Hx    Hypotension Neg Hx    Malignant hyperthermia Neg Hx    Pseudochol deficiency Neg Hx     Social History   Socioeconomic History   Marital status: Single    Spouse name: Not on file   Number of children: Not on file   Years of education: Not on file   Highest education level: Not on file   Occupational History   Not on file  Tobacco Use   Smoking status: Never   Smokeless tobacco: Never  Substance and Sexual Activity   Alcohol use: No    Alcohol/week: 0.0 standard drinks of alcohol   Drug use: No   Sexual activity: Yes  Other Topics Concern   Not on file  Social History Narrative   Not on file   Social Determinants of Health   Financial Resource Strain: Low Risk  (04/28/2021)   Overall Financial Resource Strain (CARDIA)    Difficulty of Paying Living Expenses: Not hard at all  Food Insecurity: No Food Insecurity (04/28/2021)   Hunger Vital Sign    Worried About Running Out of Food in the Last Year: Never true    Ran Out of Food in the Last Year: Never true  Transportation Needs: No Transportation Needs (04/28/2021)   PRAPARE - Hydrologist (Medical): No    Lack of Transportation (Non-Medical): No  Physical Activity: Sufficiently Active (04/28/2021)   Exercise Vital Sign    Days of Exercise per Week: 4 days    Minutes of Exercise per Session: 60 min  Stress: No Stress Concern Present (04/28/2021)   [place]  [facility]    Feeling of Stress : Not at all  Social Connections: Socially Isolated (04/28/2021)   Social Connection and Isolation Panel [NHANES]    Frequency of Communication with Friends and Family: More than three times a week    Frequency of Social Gatherings with Friends and Family: More than three times a week    Attends Religious Services: Never    Marine scientist or Organizations: No    Attends Archivist Meetings: Never    Marital Status: Never married    No Known Allergies  Outpatient Medications Prior to Visit  Medication Sig Dispense Refill   ASPIRIN 81 PO Take by mouth.     aspirin EC 325 MG tablet Take 1 tablet (325 mg total) by mouth daily. 30 tablet 0   cetirizine (ZYRTEC) 10 MG tablet TAKE 1 TABLET(10 MG) BY MOUTH DAILY 90 tablet 0    diclofenac Sodium (VOLTAREN) 1 % GEL APPLY 4 GRAMS EXTERNALLY TO THE AFFECTED AREA FOUR TIMES DAILY 100 g 2   meloxicam (MOBIC) 15 MG tablet TAKE 1 TABLET BY MOUTH DAILY 90 tablet 0   ondansetron (ZOFRAN) 4 MG tablet Take 1 tablet (4 mg total) by mouth every 8 (eight) hours as needed for up to 10 doses for nausea or vomiting. 10 tablet 0   vitamin E 180 MG (400 UNITS) capsule Take 800 Units by mouth daily.     albuterol (VENTOLIN HFA) 108 (90 Base) MCG/ACT inhaler INHALE 2 PUFFS INTO THE LUNGS EVERY 6 HOURS AS NEEDED FOR WHEEZING OR SHORTNESS OF BREATH 6.7 g 0   amLODipine (NORVASC) 10 MG tablet TAKE 1 TABLET(10 MG) BY MOUTH DAILY 90 tablet 0   atorvastatin (LIPITOR) 20 MG tablet TAKE 1 TABLET(20 MG) BY MOUTH DAILY 90 tablet 0   carvedilol (COREG) 6.25 MG tablet TAKE 1 TABLET(6.25 MG) BY MOUTH TWICE DAILY WITH A MEAL 180 tablet 0   fluticasone (FLONASE) 50 MCG/ACT nasal spray SHAKE LIQUID AND USE 2 SPRAYS IN EACH NOSTRIL DAILY 48 g 0   hydrochlorothiazide (HYDRODIURIL) 25 MG tablet TAKE 1 TABLET(25 MG) BY MOUTH DAILY 90 tablet 0   omeprazole (PRILOSEC) 20 MG capsule TAKE 1 CAPSULE(20 MG) BY MOUTH DAILY 90 capsule 0   No facility-administered medications prior to visit.     ROS Review of Systems  Constitutional:  Negative for activity change and appetite change.  HENT:  Negative for sinus pressure and sore throat.   Respiratory:  Negative for chest tightness, shortness of breath and wheezing.   Cardiovascular:  Negative for chest pain and palpitations.  Gastrointestinal:  Negative for abdominal distention, abdominal pain and constipation.  Genitourinary: Negative.   Musculoskeletal: Negative.   Psychiatric/Behavioral:  Negative for behavioral problems and dysphoric mood.     Objective:  BP 128/60   Pulse 73   Temp (!) 97.5 F (36.4 C) (Oral)   Ht 5' 11"  (1.803 m)   Wt 214 lb 6.4 oz (97.3 kg)   SpO2 97%   BMI 29.90 kg/m      06/07/2022    8:50 AM 06/07/2022    8:40 AM 09/21/2021     9:15 AM  BP/Weight  Systolic BP 409 811 914  Diastolic BP 60 74 83  Wt. (Lbs)  214.4 225  BMI  29.9 kg/m2 31.38 kg/m2      Physical Exam Constitutional:      Appearance: He is well-developed.  Cardiovascular:     Rate and  Rhythm: Normal rate.     Heart sounds: Normal heart sounds. No murmur heard. Pulmonary:     Effort: Pulmonary effort is normal.     Breath sounds: Normal breath sounds. No wheezing or rales.  Chest:     Chest wall: No tenderness.  Abdominal:     General: Bowel sounds are normal. There is no distension.     Palpations: Abdomen is soft. There is no mass.     Tenderness: There is no abdominal tenderness.  Musculoskeletal:        General: Normal range of motion.     Right lower leg: No edema.     Left lower leg: No edema.  Neurological:     Mental Status: He is alert and oriented to person, place, and time.  Psychiatric:        Mood and Affect: Mood normal.        Latest Ref Rng & Units 09/21/2021   10:17 AM 01/06/2021    9:07 AM 07/08/2020    9:04 AM  CMP  Glucose 70 - 99 mg/dL 107  111  102   BUN 8 - 27 mg/dL 16  14  15    Creatinine 0.76 - 1.27 mg/dL 1.01  0.98  0.87   Sodium 134 - 144 mmol/L 139  139  138   Potassium 3.5 - 5.2 mmol/L 4.3  4.0  3.7   Chloride 96 - 106 mmol/L 99  101  98   CO2 20 - 29 mmol/L 28  24  25    Calcium 8.6 - 10.2 mg/dL 10.1  9.9  9.9   Total Protein 6.0 - 8.5 g/dL 8.0   8.4   Total Bilirubin 0.0 - 1.2 mg/dL 1.2   0.8   Alkaline Phos 44 - 121 IU/L 68   79   AST 0 - 40 IU/L 33   24   ALT 0 - 44 IU/L 26   30     Lipid Panel     Component Value Date/Time   CHOL 138 09/21/2021 1017   TRIG 41 09/21/2021 1017   HDL 51 09/21/2021 1017   CHOLHDL 2.3 07/08/2020 0904   CHOLHDL 3.1 04/01/2014 1226   VLDL 9 04/01/2014 1226   LDLCALC 77 09/21/2021 1017    CBC    Component Value Date/Time   WBC 4.1 03/01/2017 0914   WBC 3.9 (L) 11/18/2015 1016   RBC 4.97 03/01/2017 0914   RBC 4.88 11/18/2015 1016   HGB 15.0  03/01/2017 0914   HCT 42.3 03/01/2017 0914   PLT 229 03/01/2017 0914   MCV 85 03/01/2017 0914   MCH 30.2 03/01/2017 0914   MCH 29.3 11/18/2015 1016   MCHC 35.5 03/01/2017 0914   MCHC 34.2 11/18/2015 1016   RDW 14.2 03/01/2017 0914   LYMPHSABS 1.2 10/03/2014 1126   MONOABS 0.6 10/03/2014 1126   EOSABS 0.0 10/03/2014 1126   BASOSABS 0.0 10/03/2014 1126    Lab Results  Component Value Date   HGBA1C 5.40 11/18/2015    Assessment & Plan:  1. Essential hypertension, benign Controlled Continue current regimen Counseled on blood pressure goal of less than 130/80, low-sodium, DASH diet, medication compliance, 150 minutes of moderate intensity exercise per week. Discussed medication compliance, adverse effects. - amLODipine (NORVASC) 10 MG tablet; Take 1 tablet (10 mg total) by mouth daily.  Dispense: 90 tablet; Refill: 1 - carvedilol (COREG) 6.25 MG tablet; TAKE 1 TABLET(6.25 MG) BY MOUTH TWICE DAILY WITH A MEAL  Dispense: 180 tablet;  Refill: 1 - hydrochlorothiazide (HYDRODIURIL) 25 MG tablet; Take 1 tablet (25 mg total) by mouth daily.  Dispense: 90 tablet; Refill: 1 - CMP14+EGFR  2. Hyperlipidemia LDL goal <100 Controlled Low-cholesterol diet - atorvastatin (LIPITOR) 20 MG tablet; TAKE 1 TABLET(20 MG) BY MOUTH DAILY  Dispense: 90 tablet; Refill: 1 - LP+Non-HDL Cholesterol  3. Cough He uses this intermittently - albuterol (VENTOLIN HFA) 108 (90 Base) MCG/ACT inhaler; Inhale 2 puffs into the lungs every 6 (six) hours as needed for wheezing or shortness of breath.  Dispense: 6.7 g; Refill: 0  4. Other chronic sinusitis Controlled - fluticasone (FLONASE) 50 MCG/ACT nasal spray; Place 1 spray into both nostrils daily.  Dispense: 48 g; Refill: 1  5. Gastroesophageal reflux disease without esophagitis Stable - omeprazole (PRILOSEC) 20 MG capsule; TAKE 1 CAPSULE(20 MG) BY MOUTH DAILY  Dispense: 90 capsule; Refill: 1  6. Screening for diabetes mellitus - Hemoglobin A1c  7.  Screening for colon cancer Declines colonoscopy - Cologuard  8. pneumococcal vaccine PCV 20 administered  Health Care Maintenance: PCV 20 administered today Meds ordered this encounter  Medications   amLODipine (NORVASC) 10 MG tablet    Sig: Take 1 tablet (10 mg total) by mouth daily.    Dispense:  90 tablet    Refill:  1   atorvastatin (LIPITOR) 20 MG tablet    Sig: TAKE 1 TABLET(20 MG) BY MOUTH DAILY    Dispense:  90 tablet    Refill:  1   carvedilol (COREG) 6.25 MG tablet    Sig: TAKE 1 TABLET(6.25 MG) BY MOUTH TWICE DAILY WITH A MEAL    Dispense:  180 tablet    Refill:  1   hydrochlorothiazide (HYDRODIURIL) 25 MG tablet    Sig: Take 1 tablet (25 mg total) by mouth daily.    Dispense:  90 tablet    Refill:  1   albuterol (VENTOLIN HFA) 108 (90 Base) MCG/ACT inhaler    Sig: Inhale 2 puffs into the lungs every 6 (six) hours as needed for wheezing or shortness of breath.    Dispense:  6.7 g    Refill:  0   fluticasone (FLONASE) 50 MCG/ACT nasal spray    Sig: Place 1 spray into both nostrils daily.    Dispense:  48 g    Refill:  1   omeprazole (PRILOSEC) 20 MG capsule    Sig: TAKE 1 CAPSULE(20 MG) BY MOUTH DAILY    Dispense:  90 capsule    Refill:  1    Follow-up: Return in about 6 months (around 12/08/2022).       Charlott Rakes, MD, FAAFP. [facility] [place], [place]   06/07/2022, 9:20 AM

## 2022-06-07 NOTE — Patient Instructions (Addendum)
DASH Eating Plan DASH stands for Dietary Approaches to Stop Hypertension. The DASH eating plan is a healthy eating plan that has been shown to: Reduce high blood pressure (hypertension). Reduce your risk for type 2 diabetes, heart disease, and stroke. Help with weight loss. What are tips for following this plan? Reading food labels Check food labels for the amount of salt (sodium) per serving. Choose foods with less than 5 percent of the Daily Value of sodium. Generally, foods with less than 300 milligrams (mg) of sodium per serving fit into this eating plan. To find whole grains, look for the word "whole" as the first word in the ingredient list. Shopping Buy products labeled as "low-sodium" or "no salt added." Buy fresh foods. Avoid canned foods and pre-made or frozen meals. Cooking Avoid adding salt when cooking. Use salt-free seasonings or herbs instead of table salt or sea salt. Check with your health care provider or pharmacist before using salt substitutes. Do not fry foods. Cook foods using healthy methods such as baking, boiling, grilling, roasting, and broiling instead. Cook with heart-healthy oils, such as olive, canola, avocado, soybean, or sunflower oil. Meal planning  Eat a balanced diet that includes: 4 or more servings of fruits and 4 or more servings of vegetables each day. Try to fill one-half of your plate with fruits and vegetables. 6-8 servings of whole grains each day. Less than 6 oz (170 g) of lean meat, poultry, or fish each day. A 3-oz (85-g) serving of meat is about the same size as a deck of cards. One egg equals 1 oz (28 g). 2-3 servings of low-fat dairy each day. One serving is 1 cup (237 mL). 1 serving of nuts, seeds, or beans 5 times each week. 2-3 servings of heart-healthy fats. Healthy fats called omega-3 fatty acids are found in foods such as walnuts, flaxseeds, fortified milks, and eggs. These fats are also found in cold-water fish, such as sardines, salmon,  and mackerel. Limit how much you eat of: Canned or prepackaged foods. Food that is high in trans fat, such as some fried foods. Food that is high in saturated fat, such as fatty meat. Desserts and other sweets, sugary drinks, and other foods with added sugar. Full-fat dairy products. Do not salt foods before eating. Do not eat more than 4 egg yolks a week. Try to eat at least 2 vegetarian meals a week. Eat more home-cooked food and less restaurant, buffet, and fast food. Lifestyle When eating at a restaurant, ask that your food be prepared with less salt or no salt, if possible. If you drink alcohol: Limit how much you use to: 0-1 drink a day for women who are not pregnant. 0-2 drinks a day for men. Be aware of how much alcohol is in your drink. In the [place] one drink equals one 12 oz bottle of beer (355 mL), one 5 oz glass of wine (148 mL), or one 1 oz glass of hard liquor (44 mL). General information Avoid eating more than 2,300 mg of salt a day. If you have hypertension, you may need to reduce your sodium intake to 1,500 mg a day. Work with your health care provider to maintain a healthy body weight or to lose weight. Ask what an ideal weight is for you. Get at least 30 minutes of exercise that causes your heart to beat faster (aerobic exercise) most days of the week. Activities may include walking, swimming, or biking. Work with your health care provider or dietitian to   adjust your eating plan to your individual calorie needs. What foods should I eat? Fruits All fresh, dried, or frozen fruit. Canned fruit in natural juice (without added sugar). Vegetables Fresh or frozen vegetables (raw, steamed, roasted, or grilled). Low-sodium or reduced-sodium tomato and vegetable juice. Low-sodium or reduced-sodium tomato sauce and tomato paste. Low-sodium or reduced-sodium canned vegetables. Grains Whole-grain or whole-wheat bread. Whole-grain or whole-wheat pasta. Brown rice. Oatmeal. Quinoa.  Bulgur. Whole-grain and low-sodium cereals. Pita bread. Low-fat, low-sodium crackers. Whole-wheat flour tortillas. Meats and other proteins Skinless chicken or turkey. Ground chicken or turkey. Pork with fat trimmed off. Fish and seafood. Egg whites. Dried beans, peas, or lentils. Unsalted nuts, nut butters, and seeds. Unsalted canned beans. Lean cuts of beef with fat trimmed off. Low-sodium, lean precooked or cured meat, such as sausages or meat loaves. Dairy Low-fat (1%) or fat-free (skim) milk. Reduced-fat, low-fat, or fat-free cheeses. Nonfat, low-sodium ricotta or cottage cheese. Low-fat or nonfat yogurt. Low-fat, low-sodium cheese. Fats and oils Soft margarine without trans fats. Vegetable oil. Reduced-fat, low-fat, or light mayonnaise and salad dressings (reduced-sodium). Canola, safflower, olive, avocado, soybean, and sunflower oils. Avocado. Seasonings and condiments Herbs. Spices. Seasoning mixes without salt. Other foods Unsalted popcorn and pretzels. Fat-free sweets. The items listed above may not be a complete list of foods and beverages you can eat. Contact a dietitian for more information. What foods should I avoid? Fruits Canned fruit in a light or heavy syrup. Fried fruit. Fruit in cream or butter sauce. Vegetables Creamed or fried vegetables. Vegetables in a cheese sauce. Regular canned vegetables (not low-sodium or reduced-sodium). Regular canned tomato sauce and paste (not low-sodium or reduced-sodium). Regular tomato and vegetable juice (not low-sodium or reduced-sodium). Pickles. Olives. Grains Baked goods made with fat, such as croissants, muffins, or some breads. Dry pasta or rice meal packs. Meats and other proteins Fatty cuts of meat. Ribs. Fried meat. Bacon. Bologna, salami, and other precooked or cured meats, such as sausages or meat loaves. Fat from the back of a pig (fatback). Bratwurst. Salted nuts and seeds. Canned beans with added salt. Canned or smoked fish.  Whole eggs or egg yolks. Chicken or turkey with skin. Dairy Whole or 2% milk, cream, and half-and-half. Whole or full-fat cream cheese. Whole-fat or sweetened yogurt. Full-fat cheese. Nondairy creamers. Whipped toppings. Processed cheese and cheese spreads. Fats and oils Butter. Stick margarine. Lard. Shortening. Ghee. Bacon fat. Tropical oils, such as coconut, palm kernel, or palm oil. Seasonings and condiments Onion salt, garlic salt, seasoned salt, table salt, and sea salt. Worcestershire sauce. Tartar sauce. Barbecue sauce. Teriyaki sauce. Soy sauce, including reduced-sodium. Steak sauce. Canned and packaged gravies. Fish sauce. Oyster sauce. Cocktail sauce. Store-bought horseradish. Ketchup. Mustard. Meat flavorings and tenderizers. Bouillon cubes. Hot sauces. Pre-made or packaged marinades. Pre-made or packaged taco seasonings. Relishes. Regular salad dressings. Other foods Salted popcorn and pretzels. The items listed above may not be a complete list of foods and beverages you should avoid. Contact a dietitian for more information. Where to find more information National Heart, Lung, and Blood Institute: www.nhlbi.nih.gov American Heart Association: www.heart.org Academy of Nutrition and Dietetics: www.eatright.org National Kidney Foundation: www.kidney.org Summary The DASH eating plan is a healthy eating plan that has been shown to reduce high blood pressure (hypertension). It may also reduce your risk for type 2 diabetes, heart disease, and stroke. When on the DASH eating plan, aim to eat more fresh fruits and vegetables, whole grains, lean proteins, low-fat dairy, and heart-healthy fats. With the DASH   eating plan, you should limit salt (sodium) intake to 2,300 mg a day. If you have hypertension, you may need to reduce your sodium intake to 1,500 mg a day. Work with your health care provider or dietitian to adjust your eating plan to your individual calorie needs. This information is not  intended to replace advice given to you by your health care provider. Make sure you discuss any questions you have with your health care provider. Document Revised: 09/19/2019 Document Reviewed: 09/19/2019 Elsevier Patient Education  [place]. Pneumococcal Conjugate Vaccine: What You Need to Know 1. Why get vaccinated? Pneumococcal conjugate vaccine can prevent pneumococcal disease. Pneumococcal disease refers to any illness caused by pneumococcal bacteria. These bacteria can cause many types of illnesses, including pneumonia, which is an infection of the lungs. Pneumococcal bacteria are one of the most common causes of pneumonia. Besides pneumonia, pneumococcal bacteria can also cause: Ear infections Sinus infections Meningitis (infection of the tissue covering the brain and spinal cord) Bacteremia (infection of the blood) Anyone can get pneumococcal disease, but children under 38 years old, people with certain medical conditions or other risk factors, and adults 23 years or older are at the highest risk. Most pneumococcal infections are mild. However, some can result in long-term problems, such as brain damage or hearing loss. Meningitis, bacteremia, and pneumonia caused by pneumococcal disease can be fatal. 2. Pneumococcal conjugate vaccine Pneumococcal conjugate vaccine helps protect against bacteria that cause pneumococcal disease. There are three pneumococcal conjugate vaccines (PCV13, PCV15, and PCV20). The different vaccines are recommended for different people based on their age and medical status. PCV13 Infants and young children usually need 4 doses of PCV13, at ages 28, 50, 67, and 12-15 months. Older children (through age 37 months) may be vaccinated with PCV13 if they did not receive the recommended doses. Children and adolescents 23-24 years of age with certain medical conditions should receive a single dose of PCV13 if they did not already receive PCV13. PCV15 or  PCV20 Adults 50 through 66 years old with certain medical conditions or other risk factors who have not already received a pneumococcal conjugate vaccine should receive either: a single dose of PCV15 followed by a dose of pneumococcal polysaccharide vaccine (PPSV23), or a single dose of PCV20. Adults 80 years or older who have not already received a pneumococcal conjugate vaccine should receive either: a single dose of PCV15 followed by a dose of PPSV23, or a single dose of PCV20. Your health care provider can give you more information. 3. Talk with your health care provider Tell your vaccination provider if the person getting the vaccine: Has had an allergic reaction after a previous dose of any type of pneumococcal conjugate vaccine (PCV13, PCV15, PCV20, or an earlier pneumococcal conjugate vaccine known as PCV7), or to any vaccine containing diphtheria toxoid (for example, DTaP), or has any severe, life-threatening allergies In some cases, your health care provider may decide to postpone pneumococcal conjugate vaccination until a future visit. People with minor illnesses, such as a cold, may be vaccinated. People who are moderately or severely ill should usually wait until they recover. Your health care provider can give you more information. 4. Risks of a vaccine reaction Redness, swelling, pain, or tenderness where the shot is given, and fever, loss of appetite, fussiness (irritability), feeling tired, headache, muscle aches, joint pain, and chills can happen after pneumococcal conjugate vaccination. Young children may be at increased risk for seizures caused by fever after PCV13 if it is administered  at the same time as inactivated influenza vaccine. Ask your health care provider for more information. People sometimes faint after medical procedures, including vaccination. Tell your provider if you feel dizzy or have vision changes or ringing in the ears. As with any medicine, there is a very  remote chance of a vaccine causing a severe allergic reaction, other serious injury, or death. 5. What if there is a serious problem? An allergic reaction could occur after the vaccinated person leaves the clinic. If you see signs of a severe allergic reaction (hives, swelling of the face and throat, difficulty breathing, a fast heartbeat, dizziness, or weakness), call 9-1-1 and get the person to the nearest hospital. For other signs that concern you, call your health care provider. Adverse reactions should be reported to the Vaccine Adverse Event Reporting System (VAERS). Your health care provider will usually file this report, or you can do it yourself. Visit the VAERS website at www.vaers.SamedayNews.es or call 2484816737. VAERS is only for reporting reactions, and VAERS staff members do not give medical advice. 6. The National Vaccine Injury Compensation Program The [employer] Vaccine Injury Compensation Program (VICP) is a federal program that was created to compensate people who may have been injured by certain vaccines. Claims regarding alleged injury or death due to vaccination have a time limit for filing, which may be as short as two years. Visit the VICP website at GoldCloset.com.ee or call (440) 711-2134 to learn about the program and about filing a claim. 7. How can I learn more? Ask your health care provider. Call your local or state health department. Visit the website of the Food and Drug Administration (FDA) for vaccine package inserts and additional information at TraderRating.uy. Contact the Centers for Disease Control and Prevention (CDC): Call 937-584-9968 (1-800-CDC-INFO) or Visit CDC's website at http://hunter.com/. Source: CDC Vaccine Information Statement (Interim) Pneumococcal Conjugate Vaccine (12/03/2020) This same material is available at http://www.wolf.info/ for no charge. This information is not intended to replace advice given to you  by your health care provider. Make sure you discuss any questions you have with your health care provider. Document Revised: 09/14/2021 Document Reviewed: 12/17/2020 Elsevier Patient Education  [place].

## 2022-06-07 NOTE — Progress Notes (Signed)
Discuss colonoscopy PCV-20

## 2022-06-08 LAB — CMP14+EGFR
ALT: 22 IU/L (ref 0–44)
AST: 25 IU/L (ref 0–40)
Albumin/Globulin Ratio: 1.7 (ref 1.2–2.2)
Albumin: 5.1 g/dL — ABNORMAL HIGH (ref 3.9–4.9)
Alkaline Phosphatase: 67 IU/L (ref 44–121)
BUN/Creatinine Ratio: 19 (ref 10–24)
BUN: 18 mg/dL (ref 8–27)
Bilirubin Total: 1.4 mg/dL — ABNORMAL HIGH (ref 0.0–1.2)
CO2: 25 mmol/L (ref 20–29)
Calcium: 10.2 mg/dL (ref 8.6–10.2)
Chloride: 100 mmol/L (ref 96–106)
Creatinine, Ser: 0.96 mg/dL (ref 0.76–1.27)
Globulin, Total: 3 g/dL (ref 1.5–4.5)
Glucose: 105 mg/dL — ABNORMAL HIGH (ref 70–99)
Potassium: 4 mmol/L (ref 3.5–5.2)
Sodium: 140 mmol/L (ref 134–144)
Total Protein: 8.1 g/dL (ref 6.0–8.5)
eGFR: 87 mL/min/{1.73_m2} (ref >59)

## 2022-06-08 LAB — LP+NON-HDL CHOLESTEROL
Cholesterol, Total: 128 mg/dL (ref 100–199)
HDL: 53 mg/dL (ref >39)
LDL Chol Calc (NIH): 63 mg/dL (ref 0–99)
Total Non-HDL-Chol (LDL+VLDL): 75 mg/dL (ref 0–129)
Triglycerides: 57 mg/dL (ref 0–149)
VLDL Cholesterol Cal: 12 mg/dL (ref 5–40)

## 2022-06-08 LAB — HEMOGLOBIN A1C
Est. average glucose Bld gHb Est-mCnc: 128 mg/dL
Hgb A1c MFr Bld: 6.1 % — ABNORMAL HIGH (ref 4.8–5.6)

## 2022-06-10 ENCOUNTER — Other Ambulatory Visit: Payer: Self-pay | Admitting: Family Medicine

## 2022-06-10 DIAGNOSIS — M19041 Primary osteoarthritis, right hand: Secondary | ICD-10-CM

## 2022-06-12 DIAGNOSIS — Z1211 Encounter for screening for malignant neoplasm of colon: Secondary | ICD-10-CM | POA: Diagnosis not present

## 2022-06-12 NOTE — Telephone Encounter (Signed)
Last OV 06/07/22 and future OV scheduled for 12/11/22. Will refill medication.  Requested Prescriptions  Pending Prescriptions Disp Refills  . meloxicam (MOBIC) 7.5 MG tablet [Pharmacy Med Name: MELOXICAM 7.5MG TABLETS] 90 tablet 1    Sig: TAKE 1 TABLET BY MOUTH DAILY     Analgesics:  COX2 Inhibitors Failed - 06/10/2022  1:47 PM      Failed - Manual Review: Labs are only required if the patient has taken medication for more than 8 weeks.      Failed - HGB in normal range and within 360 days    Hemoglobin  Date Value Ref Range Status  03/01/2017 15.0 13.0 - 17.7 g/dL Final         Failed - HCT in normal range and within 360 days    Hematocrit  Date Value Ref Range Status  03/01/2017 42.3 37.5 - 51.0 % Final         Passed - Cr in normal range and within 360 days    Creat  Date Value Ref Range Status  03/15/2017 0.98 0.70 - 1.25 mg/dL Final    Comment:      For patients > or = 66 years of age: The upper reference limit for Creatinine is approximately 13% higher for people identified as African-American.      Creatinine, Ser  Date Value Ref Range Status  06/07/2022 0.96 0.76 - 1.27 mg/dL Final         Passed - AST in normal range and within 360 days    AST  Date Value Ref Range Status  06/07/2022 25 0 - 40 IU/L Final         Passed - ALT in normal range and within 360 days    ALT  Date Value Ref Range Status  06/07/2022 22 0 - 44 IU/L Final         Passed - eGFR is 30 or above and within 360 days    GFR, Est African American  Date Value Ref Range Status  03/15/2017 >89 >=60 mL/min Final   GFR calc Af Amer  Date Value Ref Range Status  07/08/2020 105 >59 mL/min/1.73 Final    Comment:    **[facility] currently reports eGFR in compliance with the current**   recommendations of the [employer]. [facility] will   update reporting as new guidelines are published from the NKF-ASN   Task force.    GFR, Est Non African American  Date Value Ref Range Status   03/15/2017 83 >=60 mL/min Final   GFR calc non Af Amer  Date Value Ref Range Status  07/08/2020 91 >59 mL/min/1.73 Final   eGFR  Date Value Ref Range Status  06/07/2022 87 >59 mL/min/1.73 Final         Passed - Patient is not pregnant      Passed - Valid encounter within last 12 months    Recent Outpatient Visits          5 days ago Screening for diabetes mellitus   [place], Charlane Ferretti, MD   8 months ago History of herpes zoster virus vaccination   [place], Jarome Matin, RPH-CPP   8 months ago Screening for colon cancer   [place], MD   1 year ago Cough   [place], [place]   1 year ago Hyperlipidemia LDL goal <100  [place], MD      Future Appointments            In 6 months Charlott Rakes, MD [place]

## 2022-06-16 ENCOUNTER — Ambulatory Visit: Payer: Medicare Other | Attending: Family Medicine

## 2022-06-16 DIAGNOSIS — Z0000 Encounter for general adult medical examination without abnormal findings: Secondary | ICD-10-CM | POA: Diagnosis not present

## 2022-06-16 NOTE — Patient Instructions (Signed)
[name] , Thank you for taking time to come for your Medicare Wellness Visit. I appreciate your ongoing commitment to your health goals. Please review the following plan we discussed and let me know if I can assist you in the future.   These are the goals we discussed:  Goals      DIET - REDUCE FAT INTAKE        This is a list of the screening recommended for you and due dates:  Health Maintenance  Topic Date Due   COVID-19 Vaccine (4 - Pfizer series) 05/18/2021   Colon Cancer Screening  08/08/2021   Flu Shot  05/30/2022   Tetanus Vaccine  11/17/2025   Pneumonia Vaccine  Completed   Hepatitis C Screening: USPSTF Recommendation to screen - Ages 18-79 yo.  Completed   Zoster (Shingles) Vaccine  Completed   HPV Vaccine  Aged Out  Health Maintenance After Age 87 After age 100, you are at a higher risk for certain long-term diseases and infections as well as injuries from falls. Falls are a major cause of broken bones and head injuries in people who are older than age 36. Getting regular preventive care can help to keep you healthy and well. Preventive care includes getting regular testing and making lifestyle changes as recommended by your health care provider. Talk with your health care provider about: Which screenings and tests you should have. A screening is a test that checks for a disease when you have no symptoms. A diet and exercise plan that is right for you. What should I know about screenings and tests to prevent falls? Screening and testing are the best ways to find a health problem early. Early diagnosis and treatment give you the best chance of managing medical conditions that are common after age 48. Certain conditions and lifestyle choices may make you more likely to have a fall. Your health care provider may recommend: Regular vision checks. Poor vision and conditions such as cataracts can make you more likely to have a fall. If you wear glasses, make sure to get your  prescription updated if your vision changes. Medicine review. Work with your health care provider to regularly review all of the medicines you are taking, including over-the-counter medicines. Ask your health care provider about any side effects that may make you more likely to have a fall. Tell your health care provider if any medicines that you take make you feel dizzy or sleepy. Strength and balance checks. Your health care provider may recommend certain tests to check your strength and balance while standing, walking, or changing positions. Foot health exam. Foot pain and numbness, as well as not wearing proper footwear, can make you more likely to have a fall. Screenings, including: Osteoporosis screening. Osteoporosis is a condition that causes the bones to get weaker and break more easily. Blood pressure screening. Blood pressure changes and medicines to control blood pressure can make you feel dizzy. Depression screening. You may be more likely to have a fall if you have a fear of falling, feel depressed, or feel unable to do activities that you used to do. Alcohol use screening. Using too much alcohol can affect your balance and may make you more likely to have a fall. Follow these instructions at home: Lifestyle Do not drink alcohol if: Your health care provider tells you not to drink. If you drink alcohol: Limit how much you have to: 0-1 drink a day for women. 0-2 drinks a day for men. Know  how much alcohol is in your drink. In the [place] one drink equals one 12 oz bottle of beer (355 mL), one 5 oz glass of wine (148 mL), or one 1 oz glass of hard liquor (44 mL). Do not use any products that contain nicotine or tobacco. These products include cigarettes, chewing tobacco, and vaping devices, such as e-cigarettes. If you need help quitting, ask your health care provider. Activity  Follow a regular exercise program to stay fit. This will help you maintain your balance. Ask your health  care provider what types of exercise are appropriate for you. If you need a cane or walker, use it as recommended by your health care provider. Wear supportive shoes that have nonskid soles. Safety  Remove any tripping hazards, such as rugs, cords, and clutter. Install safety equipment such as grab bars in bathrooms and safety rails on stairs. Keep rooms and walkways well-lit. General instructions Talk with your health care provider about your risks for falling. Tell your health care provider if: You fall. Be sure to tell your health care provider about all falls, even ones that seem minor. You feel dizzy, tiredness (fatigue), or off-balance. Take over-the-counter and prescription medicines only as told by your health care provider. These include supplements. Eat a healthy diet and maintain a healthy weight. A healthy diet includes low-fat dairy products, low-fat (lean) meats, and fiber from whole grains, beans, and lots of fruits and vegetables. Stay current with your vaccines. Schedule regular health, dental, and eye exams. Summary Having a healthy lifestyle and getting preventive care can help to protect your health and wellness after age 76. Screening and testing are the best way to find a health problem early and help you avoid having a fall. Early diagnosis and treatment give you the best chance for managing medical conditions that are more common for people who are older than age 36. Falls are a major cause of broken bones and head injuries in people who are older than age 95. Take precautions to prevent a fall at home. Work with your health care provider to learn what changes you can make to improve your health and wellness and to prevent falls. This information is not intended to replace advice given to you by your health care provider. Make sure you discuss any questions you have with your health care provider. Document Revised: 03/07/2021 Document Reviewed: 03/07/2021 Elsevier  Patient Education  [place].

## 2022-06-16 NOTE — Progress Notes (Signed)
Subjective:   Dustin [name] is a 66 y.o. male who presents for Medicare Annual/Subsequent preventive examination.  Review of Systems     I connected with Dustin [name] on 06/16/2022 at 2:27 pm by telephone and verified that I am speaking with the correct person using two identifiers. I discussed the limitations, risks, security and privacy concerns of performing an evaluation and management service by telephone and the availability of in person appointments. I also discussed with the patient that there may be a patient responsible charge related to this service. The patient expressed understanding and agreed to proceed.   Patient location: Home  My Location: [place] on the telephone call: Myself and Patient      Cardiac Risk Factors include: none     Objective:    There were no vitals filed for this visit. There is no height or weight on file to calculate BMI.     06/16/2022    2:29 PM 04/28/2021    8:17 AM 02/13/2021   10:59 AM 07/04/2020    8:58 AM 03/01/2017    8:40 AM 11/21/2016    8:40 AM 10/09/2016    9:36 AM  Advanced Directives  Does Patient Have a Medical Advance Directive? No No No No No No No  Would patient like information on creating a medical advance directive? No - Patient declined No - Patient declined No - Patient declined No - Patient declined No - Patient declined      Current Medications (verified) Outpatient Encounter Medications as of 06/16/2022  Medication Sig   albuterol (VENTOLIN HFA) 108 (90 Base) MCG/ACT inhaler Inhale 2 puffs into the lungs every 6 (six) hours as needed for wheezing or shortness of breath.   amLODipine (NORVASC) 10 MG tablet Take 1 tablet (10 mg total) by mouth daily.   ASPIRIN 81 PO Take by mouth.   aspirin EC 325 MG tablet Take 1 tablet (325 mg total) by mouth daily.   atorvastatin (LIPITOR) 20 MG tablet TAKE 1 TABLET(20 MG) BY MOUTH DAILY   carvedilol (COREG) 6.25 MG tablet TAKE 1 TABLET(6.25 MG) BY  MOUTH TWICE DAILY WITH A MEAL   cetirizine (ZYRTEC) 10 MG tablet TAKE 1 TABLET(10 MG) BY MOUTH DAILY   diclofenac Sodium (VOLTAREN) 1 % GEL APPLY 4 GRAMS EXTERNALLY TO THE AFFECTED AREA FOUR TIMES DAILY   fluticasone (FLONASE) 50 MCG/ACT nasal spray Place 1 spray into both nostrils daily.   hydrochlorothiazide (HYDRODIURIL) 25 MG tablet Take 1 tablet (25 mg total) by mouth daily.   meloxicam (MOBIC) 15 MG tablet TAKE 1 TABLET BY MOUTH DAILY   meloxicam (MOBIC) 7.5 MG tablet TAKE 1 TABLET BY MOUTH DAILY   omeprazole (PRILOSEC) 20 MG capsule TAKE 1 CAPSULE(20 MG) BY MOUTH DAILY   ondansetron (ZOFRAN) 4 MG tablet Take 1 tablet (4 mg total) by mouth every 8 (eight) hours as needed for up to 10 doses for nausea or vomiting.   vitamin E 180 MG (400 UNITS) capsule Take 800 Units by mouth daily.   No facility-administered encounter medications on file as of 06/16/2022.    Allergies (verified) Patient has no known allergies.   History: Past Medical History:  Diagnosis Date   Arthritis    bil knees, left hand   Carcinoid tumor    DVT of upper extremity (deep vein thrombosis) (HCC)    left brachial vein, 01/85   Helicobacter pylori gastritis    History of blood clots    to left  arm   Hypertension    Shortness of breath dyspnea    with exertion   Tubular adenoma of colon 2017   Past Surgical History:  Procedure Laterality Date   colonscopy      1 year ago    KNEE ARTHROPLASTY Right 04/30/2015   Procedure: COMPUTER ASSISTED TOTAL KNEE ARTHROPLASTY;  Surgeon: Marybelle Killings, MD;  Location: [facility];  Service: Orthopedics;  Laterality: Right;   Left  Hand   2011   cyst removed.   TOTAL KNEE ARTHROPLASTY  10/19/2011   Procedure: TOTAL KNEE ARTHROPLASTY;  Surgeon: Sharmon Revere;  Location: [facility];  Service: Orthopedics;  Laterality: Left;   Family History  Problem Relation Age of Onset   Cancer Mother    Cancer Father        prostate cancer   Diabetes Other    Hypertension Other     Diabetes Maternal Uncle    Anesthesia problems Neg Hx    Hypotension Neg Hx    Malignant hyperthermia Neg Hx    Pseudochol deficiency Neg Hx    Social History   Socioeconomic History   Marital status: Single    Spouse name: Not on file   Number of children: Not on file   Years of education: Not on file   Highest education level: Not on file  Occupational History   Not on file  Tobacco Use   Smoking status: Never   Smokeless tobacco: Never  Substance and Sexual Activity   Alcohol use: No    Alcohol/week: 0.0 standard drinks of alcohol   Drug use: No   Sexual activity: Yes  Other Topics Concern   Not on file  Social History Narrative   Not on file   Social Determinants of Health   Financial Resource Strain: Low Risk  (04/28/2021)   Overall Financial Resource Strain (CARDIA)    Difficulty of Paying Living Expenses: Not hard at all  Food Insecurity: No Food Insecurity (04/28/2021)   Hunger Vital Sign    Worried About Running Out of Food in the Last Year: Never true    Ran Out of Food in the Last Year: Never true  Transportation Needs: No Transportation Needs (04/28/2021)   PRAPARE - Hydrologist (Medical): No    Lack of Transportation (Non-Medical): No  Physical Activity: Sufficiently Active (04/28/2021)   Exercise Vital Sign    Days of Exercise per Week: 4 days    Minutes of Exercise per Session: 60 min  Stress: No Stress Concern Present (04/28/2021)   [place]    Feeling of Stress : Not at all  Social Connections: Socially Isolated (04/28/2021)   Social Connection and Isolation Panel [NHANES]    Frequency of Communication with Friends and Family: More than three times a week    Frequency of Social Gatherings with Friends and Family: More than three times a week    Attends Religious Services: Never    Marine scientist or Organizations: No    Attends Arts administrator: Never    Marital Status: Never married    Tobacco Counseling Counseling given: Not Answered   Clinical Intake:  Pre-visit preparation completed: No  Pain : No/denies pain     Nutritional Risks: None Diabetes: No  How often do you need to have someone help you when you read instructions, pamphlets, or other written materials from your doctor or pharmacy?: 1 -  Never  Diabetic? No  Interpreter Needed?: No      Activities of Daily Living    06/16/2022    2:29 PM  In your present state of health, do you have any difficulty performing the following activities:  Hearing? 0  Vision? 0  Difficulty concentrating or making decisions? 0  Walking or climbing stairs? 0  Dressing or bathing? 0  Doing errands, shopping? 0  Preparing Food and eating ? N  Using the Toilet? N  In the past six months, have you accidently leaked urine? N  Do you have problems with loss of bowel control? N  Managing your Medications? N  Managing your Finances? N  Housekeeping or managing your Housekeeping? N    Patient Care Team: Charlott Rakes, MD as PCP - General (Family Medicine)  Indicate any recent Medical Services you may have received from other than [facility] providers in the past year (date may be approximate).     Assessment:   This is a routine wellness examination for Dustin [name].  Hearing/Vision screen No results found.  Dietary issues and exercise activities discussed: Current Exercise Habits: Home exercise routine, Type of exercise: walking;stretching, Time (Minutes): 30, Frequency (Times/Week): 4, Weekly Exercise (Minutes/Week): 120, Intensity: Mild, Exercise limited by: None identified   Goals Addressed   None    Depression Screen    06/16/2022    2:29 PM 06/07/2022    8:43 AM 04/28/2021    8:12 AM 04/28/2021    8:11 AM 01/06/2021    8:35 AM 07/08/2020    8:47 AM 09/23/2019    2:26 PM  PHQ 2/9 Scores  PHQ - 2 Score 0 0 0 0 0 0 0  PHQ- 9 Score  0   0 0     Fall  Risk    06/16/2022    2:29 PM 06/07/2022    8:42 AM 09/21/2021    9:16 AM 04/28/2021    8:18 AM 01/06/2021    8:35 AM  Fall Risk   Falls in the past year? 0 0 0 0 0  Number falls in past yr: 0 0  0 0  Injury with Fall? 0 0  0 0  Risk for fall due to : No Fall Risks   No Fall Risks   Follow up    Education provided     [place]:  Any stairs in or around the home? No  If so, are there any without handrails? No  Home free of loose throw rugs in walkways, pet beds, electrical cords, etc? Yes  Adequate lighting in your home to reduce risk of falls? Yes   ASSISTIVE DEVICES UTILIZED TO PREVENT FALLS:  Life alert? No  Use of a cane, walker or w/c? No  Grab bars in the bathroom? Yes  Shower chair or bench in shower? No  Elevated toilet seat or a handicapped toilet? No   TIMED UP AND GO:  Was the test performed? No .  Length of time to ambulate 10 feet: 0  sec.   Gait steady and fast without use of assistive device  Cognitive Function:    06/16/2022    2:31 PM 04/28/2021    8:20 AM  MMSE - Mini Mental State Exam  Orientation to time 5 5  Orientation to Place 5 5  Registration 3 3  Attention/ Calculation 5 5  Recall 3 3  Language- name 2 objects 2 2  Language- repeat 1 1  Language- follow 3  step command 3 3  Language- read & follow direction 1 1  Write a sentence 1 1  Copy design 1 1  Total score 30 30        06/16/2022    2:32 PM  6CIT Screen  What Year? 0 points  What month? 0 points  What time? 0 points  Count back from 20 0 points  Months in reverse 0 points  Repeat phrase 0 points  Total Score 0 points    Immunizations Immunization History  Administered Date(s) Administered   Hepatitis B, adult 01/12/2016, 04/27/2016, 09/14/2016   Influenza, Quadrivalent, Recombinant, Inj, Pf 07/29/2019   Influenza,inj,Quad PF,6+ Mos 11/17/2014, 08/31/2015, 07/04/2016, 10/12/2017, 07/08/2020, 09/21/2021   PFIZER(Purple Top)SARS-COV-2  Vaccination 01/09/2020, 02/02/2020   PNEUMOCOCCAL CONJUGATE-20 06/07/2022   Pneumococcal Polysaccharide-23 09/14/2016   Tdap 11/18/2015   Unspecified SARS-COV-2 Vaccination 03/23/2021   Zoster Recombinat (Shingrix) 05/23/2018, 03/23/2021    TDAP status: Up to date  Flu Vaccine status: Up to date  Pneumococcal vaccine status: Up to date  Covid-19 vaccine status: Completed vaccines  Qualifies for Shingles Vaccine? Yes   Zostavax completed Yes   Shingrix Completed?: Yes  Screening Tests Health Maintenance  Topic Date Due   COVID-19 Vaccine (4 - Pfizer series) 05/18/2021   COLONOSCOPY (Pts 45-107yr Insurance coverage will need to be confirmed)  08/08/2021   INFLUENZA VACCINE  05/30/2022   TETANUS/TDAP  11/17/2025   Pneumonia Vaccine 66 Years old  Completed   Hepatitis C Screening  Completed   Zoster Vaccines- Shingrix  Completed   HPV VACCINES  Aged Out    Health Maintenance  Health Maintenance Due  Topic Date Due   COVID-19 Vaccine (4 - Pfizer series) 05/18/2021   COLONOSCOPY (Pts 45-474yrInsurance coverage will need to be confirmed)  08/08/2021   INFLUENZA VACCINE  05/30/2022    Colorectal cancer screening: Type of screening: Cologuard. Completed 06/13/2022(returned Sample). Repeat every 3 years  Lung Cancer Screening: (Low Dose CT Chest recommended if Age 66-80ears, 30 pack-year currently smoking OR have quit w/in 15years.) does not qualify.   Lung Cancer Screening Referral: No  Additional Screening:  Hepatitis C Screening: does qualify; Completed 03/19/2018  Vision Screening: Recommended annual ophthalmology exams for early detection of glaucoma and other disorders of the eye. Is the patient up to date with their annual eye exam?  No  Who is the provider or what is the name of the office in which the patient attends annual eye exams? [facility] If pt is not established with a provider, would they like to be referred to a provider to establish care? No .    Dental Screening: Recommended annual dental exams for proper oral hygiene  Community Resource Referral / Chronic Care Management: CRR required this visit?  No   CCM required this visit?  No      Plan:     I have personally reviewed and noted the following in the patient's chart:   Medical and social history Use of alcohol, tobacco or illicit drugs  Current medications and supplements including opioid prescriptions. Patient is not currently taking opioid prescriptions. Functional ability and status Nutritional status Physical activity Advanced directives List of other physicians Hospitalizations, surgeries, and ER visits in previous 12 months Vitals Screenings to include cognitive, depression, and falls Referrals and appointments  In addition, I have reviewed and discussed with patient certain preventive protocols, quality metrics, and best practice recommendations. A written personalized care plan for preventive services as well as general preventive health  recommendations were provided to patient.     Gomez Cleverly, [place]   06/16/2022   Nurse Notes: I spent 30 minutes on this telephone encounter  AVS mailed to patient

## 2022-06-21 LAB — COLOGUARD: COLOGUARD: NEGATIVE

## 2022-07-15 ENCOUNTER — Other Ambulatory Visit: Payer: Self-pay | Admitting: Family Medicine

## 2022-07-15 DIAGNOSIS — R053 Chronic cough: Secondary | ICD-10-CM

## 2022-07-17 NOTE — Telephone Encounter (Signed)
Requested Prescriptions  Pending Prescriptions Disp Refills  . albuterol (VENTOLIN HFA) 108 (90 Base) MCG/ACT inhaler [Pharmacy Med Name: ALBUTEROL HFA INH (200 PUFFS) 6.7GM] 6.7 g 0    Sig: INHALE 2 PUFFS INTO THE LUNGS EVERY 6 HOURS AS NEEDED FOR WHEEZING OR SHORTNESS OF BREATH     Pulmonology:  Beta Agonists 2 Passed - 07/15/2022 10:27 AM      Passed - Last BP in normal range    BP Readings from Last 1 Encounters:  06/07/22 128/60         Passed - Last Heart Rate in normal range    Pulse Readings from Last 1 Encounters:  06/07/22 73         Passed - Valid encounter within last 12 months    Recent Outpatient Visits          1 month ago Screening for diabetes mellitus   [place], Charlane Ferretti, MD   9 months ago History of herpes zoster virus vaccination   [place], Jarome Matin, RPH-CPP   9 months ago Screening for colon cancer   [place], MD   1 year ago Cough   [place], [place]   1 year ago Hyperlipidemia LDL goal <100   [place], MD      Future Appointments            In 4 months Charlott Rakes, MD [place]

## 2022-08-06 ENCOUNTER — Other Ambulatory Visit: Payer: Self-pay | Admitting: Family Medicine

## 2022-08-06 DIAGNOSIS — R053 Chronic cough: Secondary | ICD-10-CM

## 2022-10-15 ENCOUNTER — Other Ambulatory Visit: Payer: Self-pay | Admitting: Family Medicine

## 2022-10-15 DIAGNOSIS — E785 Hyperlipidemia, unspecified: Secondary | ICD-10-CM

## 2022-10-17 ENCOUNTER — Other Ambulatory Visit: Payer: Self-pay | Admitting: Family Medicine

## 2022-10-17 DIAGNOSIS — R053 Chronic cough: Secondary | ICD-10-CM

## 2022-12-08 ENCOUNTER — Other Ambulatory Visit: Payer: Self-pay | Admitting: Family Medicine

## 2022-12-08 DIAGNOSIS — Z91148 Patient's other noncompliance with medication regimen for other reason: Secondary | ICD-10-CM

## 2022-12-11 ENCOUNTER — Ambulatory Visit: Payer: Medicare Other | Admitting: Family Medicine

## 2022-12-13 ENCOUNTER — Encounter: Payer: Self-pay | Admitting: Physician Assistant

## 2022-12-13 ENCOUNTER — Ambulatory Visit: Payer: 59 | Attending: Family Medicine | Admitting: Physician Assistant

## 2022-12-13 VITALS — BP 122/68 | HR 71 | Temp 98.5°F | Wt 220.4 lb

## 2022-12-13 DIAGNOSIS — M19041 Primary osteoarthritis, right hand: Secondary | ICD-10-CM | POA: Diagnosis not present

## 2022-12-13 DIAGNOSIS — R053 Chronic cough: Secondary | ICD-10-CM

## 2022-12-13 DIAGNOSIS — M19042 Primary osteoarthritis, left hand: Secondary | ICD-10-CM

## 2022-12-13 DIAGNOSIS — I10 Essential (primary) hypertension: Secondary | ICD-10-CM

## 2022-12-13 DIAGNOSIS — R7303 Prediabetes: Secondary | ICD-10-CM

## 2022-12-13 DIAGNOSIS — E785 Hyperlipidemia, unspecified: Secondary | ICD-10-CM | POA: Diagnosis not present

## 2022-12-13 DIAGNOSIS — K219 Gastro-esophageal reflux disease without esophagitis: Secondary | ICD-10-CM

## 2022-12-13 DIAGNOSIS — M65332 Trigger finger, left middle finger: Secondary | ICD-10-CM

## 2022-12-13 MED ORDER — DICLOFENAC SODIUM 75 MG PO TBEC: 75.0000 mg | DELAYED_RELEASE_TABLET | Freq: Two times a day (BID) | ORAL | 1 refills | Status: DC

## 2022-12-13 MED ORDER — HYDROCHLOROTHIAZIDE 25 MG PO TABS: 25.0000 mg | ORAL_TABLET | Freq: Every day | ORAL | 1 refills | Status: DC

## 2022-12-13 MED ORDER — DICLOFENAC SODIUM 1 % EX GEL: CUTANEOUS | 2 refills | Status: AC

## 2022-12-13 MED ORDER — OMEPRAZOLE 20 MG PO CPDR: DELAYED_RELEASE_CAPSULE | ORAL | 1 refills | Status: DC

## 2022-12-13 MED ORDER — CARVEDILOL 6.25 MG PO TABS: ORAL_TABLET | ORAL | 1 refills | Status: DC

## 2022-12-13 MED ORDER — ALBUTEROL SULFATE HFA 108 (90 BASE) MCG/ACT IN AERS: 2.0000 | INHALATION_SPRAY | Freq: Four times a day (QID) | RESPIRATORY_TRACT | 1 refills | Status: DC | PRN

## 2022-12-13 MED ORDER — AMLODIPINE BESYLATE 10 MG PO TABS: 10.0000 mg | ORAL_TABLET | Freq: Every day | ORAL | 1 refills | Status: DC

## 2022-12-13 MED ORDER — ATORVASTATIN CALCIUM 20 MG PO TABS: ORAL_TABLET | ORAL | 1 refills | Status: DC

## 2022-12-13 NOTE — Patient Instructions (Signed)
Hyperglycemia Hyperglycemia is when the sugar (glucose) level in your blood is too high. High blood sugar can happen to people who have or do not have diabetes. High blood sugar can happen quickly. It can be an emergency. What are the causes? If you have diabetes, high blood sugar may be caused by: Medicines that increase blood sugar or affect your control of diabetes. Getting less physical activity. Overeating. Being sick or injured or having an infection. Having surgery. Stress. Not giving yourself enough insulin (if you are taking it). You may have high blood sugar because you have diabetes that has not been diagnosed yet. If you do not have diabetes, high blood sugar may be caused by: Certain medicines. Stress. A bad illness. An infection. Having surgery. Diseases of the pancreas. What increases the risk? This condition is more likely to develop in people who have risk factors for diabetes, such as: Having a family member with diabetes. Certain conditions in which the body's defense system (immune system) attacks itself. These are called autoimmune disorders. Being overweight. Not being active. Having a condition called insulin resistance. Having a history of: Prediabetes. Diabetes when pregnant. Polycystic ovarian syndrome (PCOS). What are the signs or symptoms? This condition may not cause symptoms. If you do have symptoms, they may include: Feeling more thirsty than normal. Needing to pee (urinate) more often than normal. Hunger. Feeling very tired. Blurry eyesight (vision). You may get other symptoms as the condition gets worse, such as: Dry mouth. Pain in your belly (abdomen). Not being hungry (loss of appetite). Breath that smells fruity. Weakness. Weight loss that is not planned. A tingling or numb feeling in your hands or feet. A headache. Cuts or bruises that heal slowly. How is this treated? Treatment depends on the cause of your condition. Treatment may  include: Taking medicine to control your blood sugar levels. Changing your medicine or dosage if you take insulin or other diabetes medicines. Lifestyle changes. These may include: Exercising more. Eating healthier foods. Losing weight. Treating an illness or infection. Checking your blood sugar more often. Stopping or reducing steroid medicines. If your condition gets very bad, you will need to be treated in the hospital. Follow these instructions at home: General instructions Take over-the-counter and prescription medicines only as told by your doctor. Do not smoke or use any products that contain nicotine or tobacco. If you need help quitting, ask your doctor. If you drink alcohol: Limit how much you have to: 0-1 drink a day for women who are not pregnant. 0-2 drinks a day for men. Know how much alcohol is in a drink. In the [place] one drink equals one 12 oz bottle of beer (355 mL), one 5 oz glass of wine (148 mL), or one 1 oz glass of hard liquor (44 mL). Manage stress. If you need help with this, ask your doctor. Do exercises as told by your doctor. Keep all follow-up visits. Eating and drinking  Stay at a healthy weight. Make sure you drink enough fluid when you: Exercise. Get sick. Are in hot temperatures. Drink enough fluid to keep your pee (urine) pale yellow. If you have diabetes:  Know the symptoms of high blood sugar. Follow your diabetes management plan as told by your doctor. Make sure you: Take insulin and medicines as told. Follow your exercise plan. Follow your meal plan. Eat on time. Do not skip meals. Check your blood sugar as often as told. Make sure you check before and after exercise. If you  exercise longer or in a different way, check your blood sugar more often. Follow your sick day plan whenever you cannot eat or drink normally. Make this plan ahead of time with your doctor. Share your diabetes management plan with people in your workplace, school,  and household. Check your pee for ketones when you are ill and as told by your doctor. Carry a card or wear jewelry that says that you have diabetes. Where to find more information American Diabetes Association: www.diabetes.org Contact a doctor if: Your blood sugar level is at or above 240 mg/dL (13.3 mmol/L) for 2 days in a row. You have problems keeping your blood sugar in your target range. You have high blood pressure often. You have signs of illness, such as: Feeling like you may vomit (feeling nauseous). Vomiting. A fever. Get help right away if: Your blood sugar monitor reads "high" even when you are taking insulin. You have trouble breathing. You have a change in how you think, feel, or act (mental status). You feel like you may vomit, and the feeling does not go away. You cannot stop vomiting. These symptoms may be an emergency. Get medical help right away. Call your local emergency services (911 in the [place] Do not wait to see if the symptoms will go away. Do not drive yourself to the hospital. Summary Hyperglycemia is when the sugar (glucose) level in your blood is too high. High blood sugar can happen to people who have or do not have diabetes. Make sure you drink enough fluids and follow your meal plan. Exercise as often as told by your doctor. Contact your doctor if you have problems keeping your blood sugar in your target range. This information is not intended to replace advice given to you by your health care provider. Make sure you discuss any questions you have with your health care provider. Document Revised: 07/30/2020 Document Reviewed: 07/30/2020 Elsevier Patient Education  [place].

## 2022-12-13 NOTE — Progress Notes (Signed)
Patient ID: Dustin [name], male   DOB: 19-Jan-1956, 67 y.o.   MRN: [id number]   Dustin [name], is a 67 y.o. male  [id number]  [id number]  DOB - 1956/09/18  Chief Complaint  Patient presents with   Hypertension    Still having a Cough with use of inhalers       Subjective:   Dustin [name] is a 67 y.o. male here today for med RF.  He does not think he is using the inhaler correctly bc he says it causes him to cough.  He denies daily use.  He has a BP cuff at home but does not check blood pressures.  He denies CP/SOB/dizziness.  He is doing well.  He would like to try something different for arthritis bc he does not feel like mobic helps very much.    No problems updated.  ALLERGIES: No Known Allergies  PAST MEDICAL HISTORY: Past Medical History:  Diagnosis Date   Arthritis    bil knees, left hand   Carcinoid tumor    DVT of upper extremity (deep vein thrombosis) (HCC)    left brachial vein, [id number]   Helicobacter pylori gastritis    History of blood clots    to left arm   Hypertension    Shortness of breath dyspnea    with exertion   Tubular adenoma of colon 2017    MEDICATIONS AT HOME: Prior to Admission medications   Medication Sig Start Date End Date Taking? Authorizing Provider  diclofenac (VOLTAREN) 75 MG EC tablet Take 1 tablet (75 mg total) by mouth 2 (two) times daily. Prn pain 12/13/22  Yes Octavie Westerhold M, PA-C  albuterol (VENTOLIN HFA) 108 (90 Base) MCG/ACT inhaler Inhale 2 puffs into the lungs every 6 (six) hours as needed for wheezing or shortness of breath. 12/13/22   Argentina Donovan, PA-C  amLODipine (NORVASC) 10 MG tablet Take 1 tablet (10 mg total) by mouth daily. 12/13/22   Argentina Donovan, PA-C  ASPIRIN 81 PO Take by mouth.    [provider]  atorvastatin (LIPITOR) 20 MG tablet TAKE 1 TABLET(20 MG) BY MOUTH DAILY 12/13/22   Freeman Caldron M, PA-C  carvedilol (COREG) 6.25 MG tablet TAKE 1 TABLET(6.25 MG) BY MOUTH TWICE DAILY WITH A  MEAL 12/13/22   Colton Engdahl, Levada Dy M, PA-C  cetirizine (ZYRTEC) 10 MG tablet TAKE 1 TABLET(10 MG) BY MOUTH DAILY 12/27/20   Charlott Rakes, MD  diclofenac Sodium (VOLTAREN) 1 % GEL APPLY 4 GRAMS EXTERNALLY TO THE AFFECTED AREA FOUR TIMES DAILY 12/13/22   Freeman Caldron M, PA-C  fluticasone (FLONASE) 50 MCG/ACT nasal spray Place 1 spray into both nostrils daily. 06/07/22   Charlott Rakes, MD  hydrochlorothiazide (HYDRODIURIL) 25 MG tablet Take 1 tablet (25 mg total) by mouth daily. 12/13/22   Argentina Donovan, PA-C  omeprazole (PRILOSEC) 20 MG capsule TAKE 1 CAPSULE(20 MG) BY MOUTH DAILY 12/13/22   Freeman Caldron M, PA-C  ondansetron (ZOFRAN) 4 MG tablet Take 1 tablet (4 mg total) by mouth every 8 (eight) hours as needed for up to 10 doses for nausea or vomiting. 03/08/21   Curatolo, Adam, DO  vitamin E 180 MG (400 UNITS) capsule Take 800 Units by mouth daily.    [provider]    ROS: Neg HEENT Neg cardiac Neg GI Neg GU Neg psych Neg neuro  Objective:   Vitals:   12/13/22 0902 12/13/22 0917  BP: (!) 152/77 122/68  Pulse: 71   Temp: 98.5 F (36.9  C)   SpO2: 97%   Weight: 220 lb 6.4 oz (100 kg)    Exam General appearance : Awake, alert, not in any distress. Speech Clear. Not toxic looking HEENT: Atraumatic and Normocephalic Neck: Supple, no JVD. No cervical lymphadenopathy.  Chest: Good air entry bilaterally, CTAB.  No rales/rhonchi/wheezing(none today) CVS: S1 S2 regular, no murmurs.  Extremities: B/L Lower Ext shows no edema, both legs are warm to touch Neurology: Awake alert, and oriented X 3, CN II-XII intact, Non focal Skin: No Rash  Data Review Lab Results  Component Value Date   HGBA1C 6.1 (H) 06/07/2022   HGBA1C 5.40 11/18/2015    Assessment & Plan   1. Prediabetes He admits to eating a lot of sugar.   I have had a lengthy discussion and provided education about insulin resistance and the intake of too much sugar/refined carbohydrates.  I have advised the  patient to work at a goal of eliminating sugary drinks, candy, desserts, sweets, refined sugars, processed foods, and white carbohydrates.  The patient expresses understanding.  - Hemoglobin A1c  2. Essential hypertension, benign Controlled-continue - amLODipine (NORVASC) 10 MG tablet; Take 1 tablet (10 mg total) by mouth daily.  Dispense: 90 tablet; Refill: 1 - carvedilol (COREG) 6.25 MG tablet; TAKE 1 TABLET(6.25 MG) BY MOUTH TWICE DAILY WITH A MEAL  Dispense: 180 tablet; Refill: 1 - hydrochlorothiazide (HYDRODIURIL) 25 MG tablet; Take 1 tablet (25 mg total) by mouth daily.  Dispense: 90 tablet; Refill: 1  3. Hyperlipidemia LDL goal <100 - atorvastatin (LIPITOR) 20 MG tablet; TAKE 1 TABLET(20 MG) BY MOUTH DAILY  Dispense: 90 tablet; Refill: 1  4. Chronic cough Requesting spacer and patient education - albuterol (VENTOLIN HFA) 108 (90 Base) MCG/ACT inhaler; Inhale 2 puffs into the lungs every 6 (six) hours as needed for wheezing or shortness of breath.  Dispense: 18 g; Refill: 1  5. Primary osteoarthritis of both hands Says mobic not helping.  Stopped mobic - diclofenac (VOLTAREN) 75 MG EC tablet; Take 1 tablet (75 mg total) by mouth 2 (two) times daily. Prn pain  Dispense: 60 tablet; Refill: 1  6. Trigger middle finger of left hand - diclofenac Sodium (VOLTAREN) 1 % GEL; APPLY 4 GRAMS EXTERNALLY TO THE AFFECTED AREA FOUR TIMES DAILY  Dispense: 100 g; Refill: 2  7. Gastroesophageal reflux disease without esophagitis - omeprazole (PRILOSEC) 20 MG capsule; TAKE 1 CAPSULE(20 MG) BY MOUTH DAILY  Dispense: 90 capsule; Refill: 1  Last labs reviewed  Return in about 6 months (around 06/13/2023) for PCP for chronic conditions.  The patient was given clear instructions to go to ER or return to medical center if symptoms don't improve, worsen or new problems develop. The patient verbalized understanding. The patient was told to call to get lab results if they haven't heard anything in the next  week.      Freeman Caldron, PA-C [facility] [place], [place]   12/13/2022, 9:20 AM

## 2022-12-14 LAB — HEMOGLOBIN A1C
Est. average glucose Bld gHb Est-mCnc: 128 mg/dL
Hgb A1c MFr Bld: 6.1 % — ABNORMAL HIGH (ref 4.8–5.6)

## 2023-01-18 ENCOUNTER — Ambulatory Visit: Payer: 59 | Attending: Family Medicine | Admitting: Pharmacist

## 2023-01-18 ENCOUNTER — Encounter: Payer: Self-pay | Admitting: Pharmacist

## 2023-01-18 DIAGNOSIS — Z79899 Other long term (current) drug therapy: Secondary | ICD-10-CM

## 2023-01-18 DIAGNOSIS — J328 Other chronic sinusitis: Secondary | ICD-10-CM

## 2023-01-18 MED ORDER — CETIRIZINE HCL 10 MG PO TABS: 10.0000 mg | ORAL_TABLET | Freq: Every day | ORAL | 1 refills | Status: DC

## 2023-01-18 MED ORDER — FLUTICASONE PROPIONATE 50 MCG/ACT NA SUSP: 1.0000 | Freq: Every day | NASAL | 1 refills | Status: DC

## 2023-01-18 NOTE — Progress Notes (Signed)
01/18/2023 Name: Dustin [name] MRN: [id number] DOB: 29-Feb-1956   Subjective:  Patient presents to clinic today for medication management.  Care Team: Primary Care Provider: Charlott Rakes, MD ; Next Scheduled Visit: 06/13/2023  Medication Access/Adherence  Current Pharmacy:  [facility] [phone] [address], [place] [address] Phone: [phone] Fax: [place] [address] Phone: [phone] Fax: [phone]   Patient reports affordability concerns with their medications: Yes  Patient reports access/transportation concerns to their pharmacy: No  Patient reports adherence concerns with their medications:  No      Objective:   Lab Results  Component Value Date   HGBA1C 6.1 (H) 12/13/2022    Lab Results  Component Value Date   CREATININE 0.96 06/07/2022   BUN 18 06/07/2022   NA 140 06/07/2022   K 4.0 06/07/2022   CL 100 06/07/2022   CO2 25 06/07/2022    Lab Results  Component Value Date   CHOL 128 06/07/2022   HDL 53 06/07/2022   LDLCALC 63 06/07/2022   TRIG 57 06/07/2022   CHOLHDL 2.3 07/08/2020    Medications Reviewed Today     Reviewed by Mathis Dad (Physician Assistant) on 12/13/22 at [place] List Status: <None>   Medication Order Taking? Sig Documenting Provider Last Dose Status Informant  albuterol (VENTOLIN HFA) 108 (90 Base) MCG/ACT inhaler [id number]  Inhale 2 puffs into the lungs every 6 (six) hours as needed for wheezing or shortness of breath. Freeman Caldron M, PA-C  Active   amLODipine (NORVASC) 10 MG tablet [id number]  Take 1 tablet (10 mg total) by mouth daily. Argentina Donovan, [place]  Active   ASPIRIN 81 PO [id number] No Take by mouth. [provider] Taking Active   atorvastatin (LIPITOR) 20 MG tablet [id number]  TAKE 1 TABLET(20 MG) BY MOUTH  DAILY Argentina Donovan, PA-C  Active   carvedilol (COREG) 6.25 MG tablet [id number]  TAKE 1 TABLET(6.25 MG) BY MOUTH TWICE DAILY WITH A MEAL McClung, Angela M, PA-C  Active   cetirizine (ZYRTEC) 10 MG tablet [id number] No TAKE 1 TABLET(10 MG) BY MOUTH DAILY Charlott Rakes, MD Taking Active   diclofenac (VOLTAREN) 75 MG EC tablet [id number] Yes Take 1 tablet (75 mg total) by mouth 2 (two) times daily. Prn pain Argentina Donovan, [place]  Active   diclofenac Sodium (VOLTAREN) 1 % GEL [id number]  APPLY 4 GRAMS EXTERNALLY TO THE AFFECTED AREA FOUR TIMES DAILY Argentina Donovan, PA-C  Active   fluticasone (FLONASE) 50 MCG/ACT nasal spray [id number] No Place 1 spray into both nostrils daily. Charlott Rakes, MD Taking Active   hydrochlorothiazide (HYDRODIURIL) 25 MG tablet [id number]  Take 1 tablet (25 mg total) by mouth daily. Freeman Caldron M, [place]  Active   omeprazole (PRILOSEC) 20 MG capsule [id number]  TAKE 1 CAPSULE(20 MG) BY MOUTH DAILY Argentina Donovan, PA-C  Active   ondansetron (ZOFRAN) 4 MG tablet [id number] No Take 1 tablet (4 mg total) by mouth every 8 (eight) hours as needed for up to 10 doses for nausea or vomiting. Lennice Sites, DO Taking Active   vitamin E 180 MG (400 UNITS) capsule [id number] No Take 800 Units by mouth daily. [provider] Taking Active             A/P:  Patient has no know  adherence challenges. Requested refills of Flonase, cetirizine, and atorvastatin. Refills sent to his pharmacy today. Patient instructed to reach out if any refills are needed prior to his upcoming PCP appointment in August.   Maryan Puls, PharmD PGY-1 [facility] Pharmacy Resident

## 2023-01-25 ENCOUNTER — Other Ambulatory Visit: Payer: Self-pay | Admitting: Family Medicine

## 2023-01-25 DIAGNOSIS — I10 Essential (primary) hypertension: Secondary | ICD-10-CM

## 2023-01-30 ENCOUNTER — Other Ambulatory Visit: Payer: Self-pay | Admitting: Physician Assistant

## 2023-01-30 DIAGNOSIS — R053 Chronic cough: Secondary | ICD-10-CM

## 2023-01-30 NOTE — Telephone Encounter (Signed)
Rx 12/13/22 18g 1RF- too soon Requested Prescriptions  Pending Prescriptions Disp Refills   albuterol (VENTOLIN HFA) 108 (90 Base) MCG/ACT inhaler [Pharmacy Med Name: ALBUTEROL HFA INH(200 PUFFS) 18GM] 18 g 1    Sig: INHALE 2 PUFFS INTO THE LUNGS EVERY 6 HOURS AS NEEDED FOR WHEEZING OR SHORTNESS OF BREATH     Pulmonology:  Beta Agonists 2 Passed - 01/30/2023 10:10 AM      Passed - Last BP in normal range    BP Readings from Last 1 Encounters:  12/13/22 122/68         Passed - Last Heart Rate in normal range    Pulse Readings from Last 1 Encounters:  12/13/22 71         Passed - Valid encounter within last 12 months    Recent Outpatient Visits           1 week ago Medication management   [place], RPH-CPP   1 month ago Prediabetes   [place], [place]   7 months ago Screening for diabetes mellitus   [place], [place], MD   1 year ago History of herpes zoster virus vaccination   [place], RPH-CPP   1 year ago Screening for colon cancer   [place], [place]ong, MD       Future Appointments             In 4 months Charlott Rakes, MD [place]

## 2023-02-02 ENCOUNTER — Other Ambulatory Visit: Payer: Self-pay | Admitting: Physician Assistant

## 2023-02-02 DIAGNOSIS — M19041 Primary osteoarthritis, right hand: Secondary | ICD-10-CM

## 2023-02-05 NOTE — Telephone Encounter (Signed)
Requested medication (s) are due for refill today: yes  Requested medication (s) are on the active medication list: yes  Last refill:  12/13/22  Future visit scheduled: yes  Notes to clinic:  Unable to refill per protocol due to failed labs, no updated results.      Requested Prescriptions  Pending Prescriptions Disp Refills   diclofenac (VOLTAREN) 75 MG EC tablet [Pharmacy Med Name: DICLOFENAC SODIUM 75MG  DR TABLETS] 60 tablet 1    Sig: TAKE 1 TABLET(75 MG) BY MOUTH TWICE DAILY AS NEEDED FOR PAIN     Analgesics:  NSAIDS Failed - 02/02/2023  6:17 PM      Failed - Manual Review: Labs are only required if the patient has taken medication for more than 8 weeks.      Failed - HGB in normal range and within 360 days    Hemoglobin  Date Value Ref Range Status  03/01/2017 15.0 13.0 - 17.7 g/dL Final         Failed - PLT in normal range and within 360 days    Platelets  Date Value Ref Range Status  03/01/2017 229 150 - 379 x10E3/uL Final         Failed - HCT in normal range and within 360 days    Hematocrit  Date Value Ref Range Status  03/01/2017 42.3 37.5 - 51.0 % Final         Passed - Cr in normal range and within 360 days    Creat  Date Value Ref Range Status  03/15/2017 0.98 0.70 - 1.25 mg/dL Final    Comment:      For patients > or = 67 years of age: The upper reference limit for Creatinine is approximately 13% higher for people identified as African-American.      Creatinine, Ser  Date Value Ref Range Status  06/07/2022 0.96 0.76 - 1.27 mg/dL Final         Passed - eGFR is 30 or above and within 360 days    GFR, Est African American  Date Value Ref Range Status  03/15/2017 >89 >=60 mL/min Final   GFR calc Af Amer  Date Value Ref Range Status  07/08/2020 105 >59 mL/min/1.73 Final    Comment:    **[facility] currently reports eGFR in compliance with the current**   recommendations of the [employer]. [facility] will   update reporting as new  guidelines are published from the NKF-ASN   Task force.    GFR, Est Non African American  Date Value Ref Range Status  03/15/2017 83 >=60 mL/min Final   GFR calc non Af Amer  Date Value Ref Range Status  07/08/2020 91 >59 mL/min/1.73 Final   eGFR  Date Value Ref Range Status  06/07/2022 87 >59 mL/min/1.73 Final         Passed - Patient is not pregnant      Passed - Valid encounter within last 12 months    Recent Outpatient Visits           2 weeks ago Medication management   [facility] [place], Cornelius Moras, RPH-CPP   1 month ago Prediabetes   [facility] [place], [place], [place]   8 months ago Screening for diabetes mellitus   [place] [facility] Hoy Register, MD   1 year ago History of herpes zoster virus vaccination   [facility] [place], Cornelius Moras, RPH-CPP   1 year ago Screening for colon cancer   [place] Hoy Register, MD       Future Appointments             In 4 months Hoy Register, MD [facility]

## 2023-02-07 ENCOUNTER — Other Ambulatory Visit: Payer: Self-pay | Admitting: Physician Assistant

## 2023-04-02 ENCOUNTER — Other Ambulatory Visit: Payer: Self-pay | Admitting: Family Medicine

## 2023-04-02 DIAGNOSIS — M19042 Primary osteoarthritis, left hand: Secondary | ICD-10-CM

## 2023-04-03 NOTE — Telephone Encounter (Signed)
Requested medication (s) are due for refill today: Yes  Requested medication (s) are on the active medication list: Yes  Last refill:  02/05/23  Future visit scheduled: Yes  Notes to clinic:  Protocol indicates lab work needed.    Requested Prescriptions  Pending Prescriptions Disp Refills   diclofenac (VOLTAREN) 75 MG EC tablet [Pharmacy Med Name: DICLOFENAC SODIUM 75MG  DR TABLETS] 60 tablet 1    Sig: TAKE 1 TABLET(75 MG) BY MOUTH TWICE DAILY AS NEEDED FOR PAIN     Analgesics:  NSAIDS Failed - 04/02/2023 10:59 AM      Failed - Manual Review: Labs are only required if the patient has taken medication for more than 8 weeks.      Failed - HGB in normal range and within 360 days    Hemoglobin  Date Value Ref Range Status  03/01/2017 15.0 13.0 - 17.7 g/dL Final         Failed - PLT in normal range and within 360 days    Platelets  Date Value Ref Range Status  03/01/2017 229 150 - 379 x10E3/uL Final         Failed - HCT in normal range and within 360 days    Hematocrit  Date Value Ref Range Status  03/01/2017 42.3 37.5 - 51.0 % Final         Passed - Cr in normal range and within 360 days    Creat  Date Value Ref Range Status  03/15/2017 0.98 0.70 - 1.25 mg/dL Final    Comment:      For patients > or = 67 years of age: The upper reference limit for Creatinine is approximately 13% higher for people identified as African-American.      Creatinine, Ser  Date Value Ref Range Status  06/07/2022 0.96 0.76 - 1.27 mg/dL Final         Passed - eGFR is 30 or above and within 360 days    GFR, Est African American  Date Value Ref Range Status  03/15/2017 >89 >=60 mL/min Final   GFR calc Af Amer  Date Value Ref Range Status  07/08/2020 105 >59 mL/min/1.73 Final    Comment:    **[facility] currently reports eGFR in compliance with the current**   recommendations of the [employer]. [facility] will   update reporting as new guidelines are published from the  NKF-ASN   Task force.    GFR, Est Non African American  Date Value Ref Range Status  03/15/2017 83 >=60 mL/min Final   GFR calc non Af Amer  Date Value Ref Range Status  07/08/2020 91 >59 mL/min/1.73 Final   eGFR  Date Value Ref Range Status  06/07/2022 87 >59 mL/min/1.73 Final         Passed - Patient is not pregnant      Passed - Valid encounter within last 12 months    Recent Outpatient Visits           2 months ago Medication management   [facility] [place], Cornelius Moras, RPH-CPP   3 months ago Prediabetes   [facility] [place], [place], [place]   10 months ago Screening for diabetes mellitus   [facility] Hoy Register, MD   1 year ago History of herpes zoster virus vaccination   [facility] [place], Cornelius Moras, RPH-CPP   1 year  ago Screening for colon cancer   [facility] [facility] Hoy Register, MD       Future Appointments             In 2 months Hoy Register, MD [facility]

## 2023-04-17 NOTE — Patient Instructions (Incomplete)
Dustin [name] , Thank you for taking time to come for your Medicare Wellness Visit. I appreciate your ongoing commitment to your health goals. Please review the following plan we discussed and let me know if I can assist you in the future.   These are the goals we discussed:  Goals      DIET - REDUCE FAT INTAKE        This is a list of the screening recommended for you and due dates:  Health Maintenance  Topic Date Due   COVID-19 Vaccine (4 - 2023-24 season) 06/30/2022   Flu Shot  05/31/2023   Medicare Annual Wellness Visit  06/17/2023   Cologuard (Stool DNA test)  06/12/2025   DTaP/Tdap/Td vaccine (2 - Td or Tdap) 11/17/2025   Pneumonia Vaccine  Completed   Hepatitis C Screening  Completed   Zoster (Shingles) Vaccine  Completed   HPV Vaccine  Aged Out   Colon Cancer Screening  Discontinued    Advanced directives: Information on Advanced Care Planning can be found at [facility] Advance Health Care Directives Advance Health Care Directives ([email or web address] Please bring a copy of your health care power of attorney and living will to the office to be added to your chart at your convenience.  Conditions/risks identified: Aim for 30 minutes of exercise or brisk walking, 6-8 glasses of water, and 5 servings of fruits and vegetables each day.  Next appointment: Follow up in one year for your annual wellness visit.   Preventive Care 67 Years and Older, Male  Preventive care refers to lifestyle choices and visits with your health care provider that can promote health and wellness. What does preventive care include? A yearly physical exam. This is also called an annual well check. Dental exams once or twice a year. Routine eye exams. Ask your health care provider how often you should have your eyes checked. Personal lifestyle choices, including: Daily care of your teeth and gums. Regular physical activity. Eating a healthy diet. Avoiding tobacco and drug use. Limiting  alcohol use. Practicing safe sex. Taking low doses of aspirin every day. Taking vitamin and mineral supplements as recommended by your health care provider. What happens during an annual well check? The services and screenings done by your health care provider during your annual well check will depend on your age, overall health, lifestyle risk factors, and family history of disease. Counseling  Your health care provider may ask you questions about your: Alcohol use. Tobacco use. Drug use. Emotional well-being. Home and relationship well-being. Sexual activity. Eating habits. History of falls. Memory and ability to understand (cognition). Work and work Astronomer. Screening  You may have the following tests or measurements: Height, weight, and BMI. Blood pressure. Lipid and cholesterol levels. These may be checked every 5 years, or more frequently if you are over 37 years old. Skin check. Lung cancer screening. You may have this screening every year starting at age 31 if you have a 30-pack-year history of smoking and currently smoke or have quit within the past 15 years. Fecal occult blood test (FOBT) of the stool. You may have this test every year starting at age 39. Flexible sigmoidoscopy or colonoscopy. You may have a sigmoidoscopy every 5 years or a colonoscopy every 10 years starting at age 72. Prostate cancer screening. Recommendations will vary depending on your family history and other risks. Hepatitis C blood test. Hepatitis B blood test. Sexually transmitted disease (STD) testing. Diabetes screening. This is done by checking  your blood sugar (glucose) after you have not eaten for a while (fasting). You may have this done every 1-3 years. Abdominal aortic aneurysm (AAA) screening. You may need this if you are a current or former smoker. Osteoporosis. You may be screened starting at age 54 if you are at high risk. Talk with your health care provider about your test results,  treatment options, and if necessary, the need for more tests. Vaccines  Your health care provider may recommend certain vaccines, such as: Influenza vaccine. This is recommended every year. Tetanus, diphtheria, and acellular pertussis (Tdap, Td) vaccine. You may need a Td booster every 10 years. Zoster vaccine. You may need this after age 54. Pneumococcal 13-valent conjugate (PCV13) vaccine. One dose is recommended after age 75. Pneumococcal polysaccharide (PPSV23) vaccine. One dose is recommended after age 64. Talk to your health care provider about which screenings and vaccines you need and how often you need them. This information is not intended to replace advice given to you by your health care provider. Make sure you discuss any questions you have with your health care provider. Document Released: 11/12/2015 Document Revised: 07/05/2016 Document Reviewed: 08/17/2015 Elsevier Interactive Patient Education  2017 [employer].  Fall Prevention in the Home Falls can cause injuries. They can happen to people of all ages. There are many things you can do to make your home safe and to help prevent falls. What can I do on the outside of my home? Regularly fix the edges of walkways and driveways and fix any cracks. Remove anything that might make you trip as you walk through a door, such as a raised step or threshold. Trim any bushes or trees on the path to your home. Use bright outdoor lighting. Clear any walking paths of anything that might make someone trip, such as rocks or tools. Regularly check to see if handrails are loose or broken. Make sure that both sides of any steps have handrails. Any raised decks and porches should have guardrails on the edges. Have any leaves, snow, or ice cleared regularly. Use sand or salt on walking paths during winter. Clean up any spills in your garage right away. This includes oil or grease spills. What can I do in the bathroom? Use night  lights. Install grab bars by the toilet and in the tub and shower. Do not use towel bars as grab bars. Use non-skid mats or decals in the tub or shower. If you need to sit down in the shower, use a plastic, non-slip stool. Keep the floor dry. Clean up any water that spills on the floor as soon as it happens. Remove soap buildup in the tub or shower regularly. Attach bath mats securely with double-sided non-slip rug tape. Do not have throw rugs and other things on the floor that can make you trip. What can I do in the bedroom? Use night lights. Make sure that you have a light by your bed that is easy to reach. Do not use any sheets or blankets that are too big for your bed. They should not hang down onto the floor. Have a firm chair that has side arms. You can use this for support while you get dressed. Do not have throw rugs and other things on the floor that can make you trip. What can I do in the kitchen? Clean up any spills right away. Avoid walking on wet floors. Keep items that you use a lot in easy-to-reach places. If you need to reach something  above you, use a strong step stool that has a grab bar. Keep electrical cords out of the way. Do not use floor polish or wax that makes floors slippery. If you must use wax, use non-skid floor wax. Do not have throw rugs and other things on the floor that can make you trip. What can I do with my stairs? Do not leave any items on the stairs. Make sure that there are handrails on both sides of the stairs and use them. Fix handrails that are broken or loose. Make sure that handrails are as long as the stairways. Check any carpeting to make sure that it is firmly attached to the stairs. Fix any carpet that is loose or worn. Avoid having throw rugs at the top or bottom of the stairs. If you do have throw rugs, attach them to the floor with carpet tape. Make sure that you have a light switch at the top of the stairs and the bottom of the stairs. If  you do not have them, ask someone to add them for you. What else can I do to help prevent falls? Wear shoes that: Do not have high heels. Have rubber bottoms. Are comfortable and fit you well. Are closed at the toe. Do not wear sandals. If you use a stepladder: Make sure that it is fully opened. Do not climb a closed stepladder. Make sure that both sides of the stepladder are locked into place. Ask someone to hold it for you, if possible. Clearly mark and make sure that you can see: Any grab bars or handrails. First and last steps. Where the edge of each step is. Use tools that help you move around (mobility aids) if they are needed. These include: Canes. Walkers. Scooters. Crutches. Turn on the lights when you go into a dark area. Replace any light bulbs as soon as they burn out. Set up your furniture so you have a clear path. Avoid moving your furniture around. If any of your floors are uneven, fix them. If there are any pets around you, be aware of where they are. Review your medicines with your doctor. Some medicines can make you feel dizzy. This can increase your chance of falling. Ask your doctor what other things that you can do to help prevent falls. This information is not intended to replace advice given to you by your health care provider. Make sure you discuss any questions you have with your health care provider. Document Released: 08/12/2009 Document Revised: 03/23/2016 Document Reviewed: 11/20/2014 Elsevier Interactive Patient Education  2017 [employer].

## 2023-04-17 NOTE — Progress Notes (Unsigned)
Subjective:   Dustin [name] is a 67 y.o. male who presents for Medicare Annual/Subsequent preventive examination.  Visit Complete: {[phone]}  Patient Medicare AWV questionnaire was completed by the patient on ***; I have confirmed that all information answered by patient is correct and no changes since this date.  Review of Systems    ***       Objective:    There were no vitals filed for this visit. There is no height or weight on file to calculate BMI.     06/16/2022    2:29 PM 04/28/2021    8:17 AM 02/13/2021   10:59 AM 07/04/2020    8:58 AM 03/01/2017    8:40 AM 11/21/2016    8:40 AM 10/09/2016    9:36 AM  Advanced Directives  Does Patient Have a Medical Advance Directive? No No No No No No No  Would patient like information on creating a medical advance directive? No - Patient declined No - Patient declined No - Patient declined No - Patient declined No - Patient declined      Current Medications (verified) Outpatient Encounter Medications as of 04/18/2023  Medication Sig   albuterol (VENTOLIN HFA) 108 (90 Base) MCG/ACT inhaler Inhale 2 puffs into the lungs every 6 (six) hours as needed for wheezing or shortness of breath.   amLODipine (NORVASC) 10 MG tablet Take 1 tablet (10 mg total) by mouth daily.   ASPIRIN 81 PO Take by mouth.   atorvastatin (LIPITOR) 20 MG tablet TAKE 1 TABLET(20 MG) BY MOUTH DAILY   carvedilol (COREG) 6.25 MG tablet TAKE 1 TABLET(6.25 MG) BY MOUTH TWICE DAILY WITH A MEAL   cetirizine (ZYRTEC) 10 MG tablet Take 1 tablet (10 mg total) by mouth daily.   diclofenac (VOLTAREN) 75 MG EC tablet TAKE 1 TABLET(75 MG) BY MOUTH TWICE DAILY AS NEEDED FOR PAIN   diclofenac Sodium (VOLTAREN) 1 % GEL APPLY 4 GRAMS EXTERNALLY TO THE AFFECTED AREA FOUR TIMES DAILY   fluticasone (FLONASE) 50 MCG/ACT nasal spray Place 1 spray into both nostrils daily.   hydrochlorothiazide (HYDRODIURIL) 25 MG tablet Take 1 tablet (25 mg total) by mouth daily.    vitamin E 180 MG (400 UNITS) capsule Take 800 Units by mouth daily.   No facility-administered encounter medications on file as of 04/18/2023.    Allergies (verified) Patient has no known allergies.   History: Past Medical History:  Diagnosis Date   Arthritis    bil knees, left hand   Carcinoid tumor    DVT of upper extremity (deep vein thrombosis) (HCC)    left brachial vein, 12/2010   Helicobacter pylori gastritis    History of blood clots    to left arm   Hypertension    Shortness of breath dyspnea    with exertion   Tubular adenoma of colon 2017   Past Surgical History:  Procedure Laterality Date   colonscopy      1 year ago    KNEE ARTHROPLASTY Right 04/30/2015   Procedure: COMPUTER ASSISTED TOTAL KNEE ARTHROPLASTY;  Surgeon: Eldred Manges, MD;  Location: [facility] OR;  Service: Orthopedics;  Laterality: Right;   Left  Hand   2011   cyst removed.   TOTAL KNEE ARTHROPLASTY  10/19/2011   Procedure: TOTAL KNEE ARTHROPLASTY;  Surgeon: Kennieth Rad;  Location: [facility] OR;  Service: Orthopedics;  Laterality: Left;   Family History  Problem Relation Age of Onset   Cancer Mother    Cancer Father  prostate cancer   Diabetes Other    Hypertension Other    Diabetes Maternal Uncle    Anesthesia problems Neg Hx    Hypotension Neg Hx    Malignant hyperthermia Neg Hx    Pseudochol deficiency Neg Hx    Social History   Socioeconomic History   Marital status: Single    Spouse name: Not on file   Number of children: Not on file   Years of education: Not on file   Highest education level: Not on file  Occupational History   Not on file  Tobacco Use   Smoking status: Never   Smokeless tobacco: Never  Substance and Sexual Activity   Alcohol use: No    Alcohol/week: 0.0 standard drinks of alcohol   Drug use: No   Sexual activity: Yes  Other Topics Concern   Not on file  Social History Narrative   Not on file   Social Determinants of Health   Financial Resource Strain:  Low Risk  (01/18/2023)   Overall Financial Resource Strain (CARDIA)    Difficulty of Paying Living Expenses: Not hard at all  Food Insecurity: No Food Insecurity (01/18/2023)   Hunger Vital Sign    Worried About Running Out of Food in the Last Year: Never true    Ran Out of Food in the Last Year: Never true  Transportation Needs: No Transportation Needs (01/18/2023)   PRAPARE - Administrator, Civil Service (Medical): No    Lack of Transportation (Non-Medical): No  Physical Activity: Insufficiently Active (01/18/2023)   Exercise Vital Sign    Days of Exercise per Week: 3 days    Minutes of Exercise per Session: 30 min  Stress: No Stress Concern Present (01/18/2023)   [employer] of Occupational Health - Occupational Stress Questionnaire    Feeling of Stress : Not at all  Social Connections: Socially Isolated (01/18/2023)   Social Connection and Isolation Panel [NHANES]    Frequency of Communication with Friends and Family: Once a week    Frequency of Social Gatherings with Friends and Family: Once a week    Attends Religious Services: Never    Database administrator or Organizations: No    Attends Banker Meetings: Never    Marital Status: Never married    Tobacco Counseling Counseling given: Not Answered   Clinical Intake:                        Activities of Daily Living    06/16/2022    2:29 PM  In your present state of health, do you have any difficulty performing the following activities:  Hearing? 0  Vision? 0  Difficulty concentrating or making decisions? 0  Walking or climbing stairs? 0  Dressing or bathing? 0  Doing errands, shopping? 0  Preparing Food and eating ? N  Using the Toilet? N  In the past six months, have you accidently leaked urine? N  Do you have problems with loss of bowel control? N  Managing your Medications? N  Managing your Finances? N  Housekeeping or managing your Housekeeping? N    Patient Care  Team: Hoy Register, MD as PCP - General (Family Medicine)  Indicate any recent Medical Services you may have received from other than [facility] providers in the past year (date may be approximate).     Assessment:   This is a routine wellness examination for Cordney.  Hearing/Vision screen No results found.  Dietary issues and exercise activities discussed:     Goals Addressed   None    Depression Screen    01/18/2023   11:00 AM 12/13/2022    9:09 AM 06/16/2022    2:29 PM 06/07/2022    8:43 AM 04/28/2021    8:12 AM 04/28/2021    8:11 AM 01/06/2021    8:35 AM  PHQ 2/9 Scores  PHQ - 2 Score 0 0 0 0 0 0 0  PHQ- 9 Score  0  0   0    Fall Risk    06/16/2022    2:29 PM 06/07/2022    8:42 AM 09/21/2021    9:16 AM 04/28/2021    8:18 AM 01/06/2021    8:35 AM  Fall Risk   Falls in the past year? 0 0 0 0 0  Number falls in past yr: 0 0  0 0  Injury with Fall? 0 0  0 0  Risk for fall due to : No Fall Risks   No Fall Risks   Follow up    Education provided     MEDICARE RISK AT HOME:   TIMED UP AND GO:  Was the test performed?  No    Cognitive Function:    06/16/2022    2:31 PM 04/28/2021    8:20 AM  MMSE - Mini Mental State Exam  Orientation to time 5 5  Orientation to Place 5 5  Registration 3 3  Attention/ Calculation 5 5  Recall 3 3  Language- name 2 objects 2 2  Language- repeat 1 1  Language- follow 3 step command 3 3  Language- read & follow direction 1 1  Write a sentence 1 1  Copy design 1 1  Total score 30 30        06/16/2022    2:32 PM  6CIT Screen  What Year? 0 points  What month? 0 points  What time? 0 points  Count back from 20 0 points  Months in reverse 0 points  Repeat phrase 0 points  Total Score 0 points    Immunizations Immunization History  Administered Date(s) Administered   Hepatitis B, ADULT 01/12/2016, 04/27/2016, 09/14/2016   Influenza, Quadrivalent, Recombinant, Inj, Pf 07/29/2019   Influenza,inj,Quad PF,6+ Mos 11/17/2014,  08/31/2015, 07/04/2016, 10/12/2017, 07/08/2020, 09/21/2021   PFIZER(Purple Top)SARS-COV-2 Vaccination 01/09/2020, 02/02/2020   PNEUMOCOCCAL CONJUGATE-20 06/07/2022   Pneumococcal Polysaccharide-23 09/14/2016   Tdap 11/18/2015   Unspecified SARS-COV-2 Vaccination 03/23/2021   Zoster Recombinat (Shingrix) 05/23/2018, 03/23/2021    TDAP status: Up to date  Pneumococcal vaccine status: Up to date  Covid-19 vaccine status: Information provided on how to obtain vaccines.   Qualifies for Shingles Vaccine? Yes   Zostavax completed No   Shingrix Completed?: Yes  Screening Tests Health Maintenance  Topic Date Due   COVID-19 Vaccine (4 - 2023-24 season) 06/30/2022   INFLUENZA VACCINE  05/31/2023   Medicare Annual Wellness (AWV)  06/17/2023   Fecal DNA (Cologuard)  06/12/2025   DTaP/Tdap/Td (2 - Td or Tdap) 11/17/2025   Pneumonia Vaccine 66+ Years old  Completed   Hepatitis C Screening  Completed   Zoster Vaccines- Shingrix  Completed   HPV VACCINES  Aged Out   Colonoscopy  Discontinued    Health Maintenance  Health Maintenance Due  Topic Date Due   COVID-19 Vaccine (4 - 2023-24 season) 06/30/2022    Colorectal cancer screening: Type of screening: Cologuard. Completed 06/12/22. Repeat every 3 years  Lung Cancer Screening: (Low  Dose CT Chest recommended if Age 28-80 years, 20 pack-year currently smoking OR have quit w/in 15years.) does not qualify.   Lung Cancer Screening Referral: n/a  Additional Screening:  Hepatitis C Screening: does qualify; Completed 03/19/18  Vision Screening: Recommended annual ophthalmology exams for early detection of glaucoma and other disorders of the eye. Is the patient up to date with their annual eye exam?  {YES/NO:21197} Who is the provider or what is the name of the office in which the patient attends annual eye exams? *** If pt is not established with a provider, would they like to be referred to a provider to establish care? {YES/NO:21197}.    Dental Screening: Recommended annual dental exams for proper oral hygiene  Community Resource Referral / Chronic Care Management: CRR required this visit?  {YES/NO:21197}  CCM required this visit?  {CCM Required [phone]}     Plan:     I have personally reviewed and noted the following in the patient's chart:   Medical and social history Use of alcohol, tobacco or illicit drugs  Current medications and supplements including opioid prescriptions. {Opioid [phone]} Functional ability and status Nutritional status Physical activity Advanced directives List of other physicians Hospitalizations, surgeries, and ER visits in previous 12 months Vitals Screenings to include cognitive, depression, and falls Referrals and appointments  In addition, I have reviewed and discussed with patient certain preventive protocols, quality metrics, and best practice recommendations. A written personalized care plan for preventive services as well as general preventive health recommendations were provided to patient.     Kandis Fantasia [place], [place]   1/61/0960   After Visit Summary: (MyChart) Due to this being a telephonic visit, the after visit summary with patients personalized plan was offered to patient via MyChart   Nurse Notes: ***

## 2023-04-18 ENCOUNTER — Ambulatory Visit: Payer: 59 | Attending: Family Medicine

## 2023-04-18 VITALS — Ht 71.0 in | Wt 220.0 lb

## 2023-04-18 DIAGNOSIS — Z0000 Encounter for general adult medical examination without abnormal findings: Secondary | ICD-10-CM | POA: Diagnosis not present

## 2023-06-13 ENCOUNTER — Encounter: Payer: Self-pay | Admitting: Family Medicine

## 2023-06-13 ENCOUNTER — Ambulatory Visit: Payer: 59 | Attending: Family Medicine | Admitting: Family Medicine

## 2023-06-13 VITALS — BP 125/71 | HR 65 | Ht 71.0 in | Wt 222.6 lb

## 2023-06-13 DIAGNOSIS — E785 Hyperlipidemia, unspecified: Secondary | ICD-10-CM

## 2023-06-13 DIAGNOSIS — I10 Essential (primary) hypertension: Secondary | ICD-10-CM

## 2023-06-13 DIAGNOSIS — R7303 Prediabetes: Secondary | ICD-10-CM

## 2023-06-13 LAB — POCT GLYCOSYLATED HEMOGLOBIN (HGB A1C): HbA1c, POC (controlled diabetic range): 6 % (ref 0.0–7.0)

## 2023-06-13 MED ORDER — ATORVASTATIN CALCIUM 20 MG PO TABS: ORAL_TABLET | ORAL | 1 refills | Status: DC

## 2023-06-13 MED ORDER — AMLODIPINE BESYLATE 10 MG PO TABS: 10.0000 mg | ORAL_TABLET | Freq: Every day | ORAL | 1 refills | Status: DC

## 2023-06-13 MED ORDER — CARVEDILOL 6.25 MG PO TABS: ORAL_TABLET | ORAL | 1 refills | Status: DC

## 2023-06-13 MED ORDER — HYDROCHLOROTHIAZIDE 25 MG PO TABS: 25.0000 mg | ORAL_TABLET | Freq: Every day | ORAL | 1 refills | Status: DC

## 2023-06-13 NOTE — Patient Instructions (Signed)
Exercising to Stay Healthy To become healthy and stay healthy, it is recommended that you do moderate-intensity and vigorous-intensity exercise. You can tell that you are exercising at a moderate intensity if your heart starts beating faster and you start breathing faster but can still hold a conversation. You can tell that you are exercising at a vigorous intensity if you are breathing much harder and faster and cannot hold a conversation while exercising. How can exercise benefit me? Exercising regularly is important. It has many health benefits, such as: Improving overall fitness, flexibility, and endurance. Increasing bone density. Helping with weight control. Decreasing body fat. Increasing muscle strength and endurance. Reducing stress and tension, anxiety, depression, or anger. Improving overall health. What guidelines should I follow while exercising? Before you start a new exercise program, talk with your health care provider. Do not exercise so much that you hurt yourself, feel dizzy, or get very short of breath. Wear comfortable clothes and wear shoes with good support. Drink plenty of water while you exercise to prevent dehydration or heat stroke. Work out until your breathing and your heartbeat get faster (moderate intensity). How often should I exercise? Choose an activity that you enjoy, and set realistic goals. Your health care provider can help you make an activity plan that is individually designed and works best for you. Exercise regularly as told by your health care provider. This may include: Doing strength training two times a week, such as: Lifting weights. Using resistance bands. Push-ups. Sit-ups. Yoga. Doing a certain intensity of exercise for a given amount of time. Choose from these options: A total of 150 minutes of moderate-intensity exercise every week. A total of 75 minutes of vigorous-intensity exercise every week. A mix of moderate-intensity and  vigorous-intensity exercise every week. Children, pregnant women, people who have not exercised regularly, people who are overweight, and older adults may need to talk with a health care provider about what activities are safe to perform. If you have a medical condition, be sure to talk with your health care provider before you start a new exercise program. What are some exercise ideas? Moderate-intensity exercise ideas include: Walking 1 mile (1.6 km) in about 15 minutes. Biking. Hiking. Golfing. Dancing. Water aerobics. Vigorous-intensity exercise ideas include: Walking 4.5 miles (7.2 km) or more in about 1 hour. Jogging or running 5 miles (8 km) in about 1 hour. Biking 10 miles (16.1 km) or more in about 1 hour. Lap swimming. Roller-skating or in-line skating. Cross-country skiing. Vigorous competitive sports, such as football, basketball, and soccer. Jumping rope. Aerobic dancing. What are some everyday activities that can help me get exercise? Yard work, such as: Pushing a lawn mower. Raking and bagging leaves. Washing your car. Pushing a stroller. Shoveling snow. Gardening. Washing windows or floors. How can I be more active in my day-to-day activities? Use stairs instead of an elevator. Take a walk during your lunch break. If you drive, park your car farther away from your work or school. If you take public transportation, get off one stop early and walk the rest of the way. Stand up or walk around during all of your indoor phone calls. Get up, stretch, and walk around every 30 minutes throughout the day. Enjoy exercise with a friend. Support to continue exercising will help you keep a regular routine of activity. Where to find more information You can find more information about exercising to stay healthy from: U.S. Department of Health and Human Services: www.hhs.gov Centers for Disease Control and Prevention (  CDC): www.cdc.gov Summary Exercising regularly is  important. It will improve your overall fitness, flexibility, and endurance. Regular exercise will also improve your overall health. It can help you control your weight, reduce stress, and improve your bone density. Do not exercise so much that you hurt yourself, feel dizzy, or get very short of breath. Before you start a new exercise program, talk with your health care provider. This information is not intended to replace advice given to you by your health care provider. Make sure you discuss any questions you have with your health care provider. Document Revised: 02/11/2021 Document Reviewed: 02/11/2021 Elsevier Patient Education  2024 Elsevier Inc.  

## 2023-06-13 NOTE — Progress Notes (Signed)
Subjective:  Patient ID: Dustin [name], male    DOB: 1956/02/26  Age: 67 y.o. MRN: [id number]  CC: Medical Management of Chronic Issues   HPI Dustin [name] is a 67 y.o. year old male with a history of hypertension, hypercholesterolemia, osteoarthritis of the hands, Prediabetes who presents today for follow-up visit.   Interval History: Discussed the use of AI scribe software for clinical note transcription with the patient, who gave verbal consent to proceed.  He presents for a routine follow-up. He reports adherence to his current medication regimen, which includes amlodipine, carvedilol, hydrochlorothiazide for blood pressure control, and atorvastatin for cholesterol management. He expresses surprise at the need for three medications to control his blood pressure, but reports no issues with his current regimen.  Dustin [name] also has a diagnosis of prediabetes. He has been advised to monitor his diet and exercise regularly to prevent progression to diabetes. He reports some difficulty adhering to dietary recommendations, but his recent lab results show a slight decrease in his levels with A1c of 6.0 down from 6.1 previously, indicating he is managing his condition well.  Dustin [name] is also physically active, engaging in regular walking and exercises with resistance bands. He has also been dieting to maintain his weight. He reports no concerns or issues for today's visit.        Past Medical History:  Diagnosis Date   Arthritis    bil knees, left hand   Carcinoid tumor    DVT of upper extremity (deep vein thrombosis) (HCC)    left brachial vein, 12/2010   Helicobacter pylori gastritis    History of blood clots    to left arm   Hypertension    Shortness of breath dyspnea    with exertion   Tubular adenoma of colon 2017    Past Surgical History:  Procedure Laterality Date   colonscopy      1 year ago    KNEE ARTHROPLASTY Right 04/30/2015   Procedure: COMPUTER ASSISTED TOTAL KNEE  ARTHROPLASTY;  Surgeon: Eldred Manges, MD;  Location: [facility] OR;  Service: Orthopedics;  Laterality: Right;   Left  Hand   2011   cyst removed.   TOTAL KNEE ARTHROPLASTY  10/19/2011   Procedure: TOTAL KNEE ARTHROPLASTY;  Surgeon: Kennieth Rad;  Location: [facility] OR;  Service: Orthopedics;  Laterality: Left;    Family History  Problem Relation Age of Onset   Cancer Mother    Cancer Father        prostate cancer   Diabetes Other    Hypertension Other    Diabetes Maternal Uncle    Anesthesia problems Neg Hx    Hypotension Neg Hx    Malignant hyperthermia Neg Hx    Pseudochol deficiency Neg Hx     Social History   Socioeconomic History   Marital status: Single    Spouse name: Not on file   Number of children: Not on file   Years of education: Not on file   Highest education level: Not on file  Occupational History   Not on file  Tobacco Use   Smoking status: Never   Smokeless tobacco: Never  Substance and Sexual Activity   Alcohol use: No    Alcohol/week: 0.0 standard drinks of alcohol   Drug use: No   Sexual activity: Yes  Other Topics Concern   Not on file  Social History Narrative   Not on file   Social Determinants of Health   Financial Resource Strain: Low  Risk  (04/18/2023)   Overall Financial Resource Strain (CARDIA)    Difficulty of Paying Living Expenses: Not hard at all  Food Insecurity: No Food Insecurity (04/18/2023)   Hunger Vital Sign    Worried About Running Out of Food in the Last Year: Never true    Ran Out of Food in the Last Year: Never true  Transportation Needs: No Transportation Needs (04/18/2023)   PRAPARE - Administrator, Civil Service (Medical): No    Lack of Transportation (Non-Medical): No  Physical Activity: Insufficiently Active (04/18/2023)   Exercise Vital Sign    Days of Exercise per Week: 3 days    Minutes of Exercise per Session: 30 min  Stress: No Stress Concern Present (04/18/2023)   [employer] of Occupational Health  - Occupational Stress Questionnaire    Feeling of Stress : Not at all  Social Connections: Socially Isolated (04/18/2023)   Social Connection and Isolation Panel [NHANES]    Frequency of Communication with Friends and Family: More than three times a week    Frequency of Social Gatherings with Friends and Family: Three times a week    Attends Religious Services: Never    Active Member of Clubs or Organizations: No    Attends Banker Meetings: Never    Marital Status: Never married    No Known Allergies  Outpatient Medications Prior to Visit  Medication Sig Dispense Refill   albuterol (VENTOLIN HFA) 108 (90 Base) MCG/ACT inhaler Inhale 2 puffs into the lungs every 6 (six) hours as needed for wheezing or shortness of breath. 18 g 1   ASPIRIN 81 PO Take by mouth.     cetirizine (ZYRTEC) 10 MG tablet Take 1 tablet (10 mg total) by mouth daily. 90 tablet 1   diclofenac (VOLTAREN) 75 MG EC tablet TAKE 1 TABLET(75 MG) BY MOUTH TWICE DAILY AS NEEDED FOR PAIN 60 tablet 1   diclofenac Sodium (VOLTAREN) 1 % GEL APPLY 4 GRAMS EXTERNALLY TO THE AFFECTED AREA FOUR TIMES DAILY 100 g 2   fluticasone (FLONASE) 50 MCG/ACT nasal spray Place 1 spray into both nostrils daily. 48 g 1   vitamin E 180 MG (400 UNITS) capsule Take 800 Units by mouth daily.     amLODipine (NORVASC) 10 MG tablet Take 1 tablet (10 mg total) by mouth daily. 90 tablet 1   atorvastatin (LIPITOR) 20 MG tablet TAKE 1 TABLET(20 MG) BY MOUTH DAILY 90 tablet 1   carvedilol (COREG) 6.25 MG tablet TAKE 1 TABLET(6.25 MG) BY MOUTH TWICE DAILY WITH A MEAL 180 tablet 0   hydrochlorothiazide (HYDRODIURIL) 25 MG tablet Take 1 tablet (25 mg total) by mouth daily. 90 tablet 1   No facility-administered medications prior to visit.     ROS Review of Systems  Constitutional:  Negative for activity change and appetite change.  HENT:  Negative for sinus pressure and sore throat.   Respiratory:  Negative for chest tightness, shortness of  breath and wheezing.   Cardiovascular:  Negative for chest pain and palpitations.  Gastrointestinal:  Negative for abdominal distention, abdominal pain and constipation.  Genitourinary: Negative.   Musculoskeletal: Negative.   Psychiatric/Behavioral:  Negative for behavioral problems and dysphoric mood.     Objective:  BP 125/71   Pulse 65   Ht 5\' 11"  (1.803 m)   Wt 222 lb 9.6 oz (101 kg)   SpO2 98%   BMI 31.05 kg/m      06/13/2023    9:04 AM 04/18/2023  9:51 AM 12/13/2022    9:17 AM  BP/Weight  Systolic BP 125 -- 122  Diastolic BP 71 -- 68  Wt. (Lbs) 222.6 220   BMI 31.05 kg/m2 30.68 kg/m2       Physical Exam Constitutional:      Appearance: He is well-developed.  Cardiovascular:     Rate and Rhythm: Normal rate.     Heart sounds: Normal heart sounds. No murmur heard. Pulmonary:     Effort: Pulmonary effort is normal.     Breath sounds: Normal breath sounds. No wheezing or rales.  Chest:     Chest wall: No tenderness.  Abdominal:     General: Bowel sounds are normal. There is no distension.     Palpations: Abdomen is soft. There is no mass.     Tenderness: There is no abdominal tenderness.  Musculoskeletal:        General: Normal range of motion.     Right lower leg: No edema.     Left lower leg: No edema.  Neurological:     Mental Status: He is alert and oriented to person, place, and time.  Psychiatric:        Mood and Affect: Mood normal.        Latest Ref Rng & Units 06/07/2022    9:15 AM 09/21/2021   10:17 AM 01/06/2021    9:07 AM  CMP  Glucose 70 - 99 mg/dL 387  564  332   BUN 8 - 27 mg/dL 18  16  14    Creatinine 0.76 - 1.27 mg/dL 9.51  8.84  1.66   Sodium 134 - 144 mmol/L 140  139  139   Potassium 3.5 - 5.2 mmol/L 4.0  4.3  4.0   Chloride 96 - 106 mmol/L 100  99  101   CO2 20 - 29 mmol/L 25  28  24    Calcium 8.6 - 10.2 mg/dL 06.3  01.6  9.9   Total Protein 6.0 - 8.5 g/dL 8.1  8.0    Total Bilirubin 0.0 - 1.2 mg/dL 1.4  1.2    Alkaline Phos  44 - 121 IU/L 67  68    AST 0 - 40 IU/L 25  33    ALT 0 - 44 IU/L 22  26      Lipid Panel     Component Value Date/Time   CHOL 128 06/07/2022 0915   TRIG 57 06/07/2022 0915   HDL 53 06/07/2022 0915   CHOLHDL 2.3 07/08/2020 0904   CHOLHDL 3.1 04/01/2014 1226   VLDL 9 04/01/2014 1226   LDLCALC 63 06/07/2022 0915    CBC    Component Value Date/Time   WBC 4.1 03/01/2017 0914   WBC 3.9 (L) 11/18/2015 1016   RBC 4.97 03/01/2017 0914   RBC 4.88 11/18/2015 1016   HGB 15.0 03/01/2017 0914   HCT 42.3 03/01/2017 0914   PLT 229 03/01/2017 0914   MCV 85 03/01/2017 0914   MCH 30.2 03/01/2017 0914   MCH 29.3 11/18/2015 1016   MCHC 35.5 03/01/2017 0914   MCHC 34.2 11/18/2015 1016   RDW 14.2 03/01/2017 0914   LYMPHSABS 1.2 10/03/2014 1126   MONOABS 0.6 10/03/2014 1126   EOSABS 0.0 10/03/2014 1126   BASOSABS 0.0 10/03/2014 1126    Lab Results  Component Value Date   HGBA1C 6.0 06/13/2023    Assessment & Plan:      Hypertension Well controlled on Amlodipine, Carvedilol, and Hydrochlorothiazide. No reported side effects. -Continue  current medications. -[facility] at [facility]. -Counseled on blood pressure goal of less than 130/80, low-sodium, DASH diet, medication compliance, 150 minutes of moderate intensity exercise per week. Discussed medication compliance, adverse effects.   Hyperlipidemia Last cholesterol check in August 2023 was normal. Patient is on Atorvastatin. -Order lipid panel today. -Continue Atorvastatin.  Prediabetes HbA1c decreased from 6.1 to 6.0. Patient reports regular exercise and dieting to maintain weight. -Continue lifestyle modifications. -Monitor HbA1c levels.  General Health Maintenance -Order liver and kidney function tests. -Recommend annual flu shot at pharmacy. -Schedule follow-up visit in six months.          Meds ordered this encounter  Medications   amLODipine (NORVASC) 10 MG tablet    Sig: Take 1 tablet (10 mg  total) by mouth daily.    Dispense:  90 tablet    Refill:  1   atorvastatin (LIPITOR) 20 MG tablet    Sig: TAKE 1 TABLET(20 MG) BY MOUTH DAILY    Dispense:  90 tablet    Refill:  1   carvedilol (COREG) 6.25 MG tablet    Sig: TAKE 1 TABLET(6.25 MG) BY MOUTH TWICE DAILY WITH A MEAL    Dispense:  180 tablet    Refill:  1   hydrochlorothiazide (HYDRODIURIL) 25 MG tablet    Sig: Take 1 tablet (25 mg total) by mouth daily.    Dispense:  90 tablet    Refill:  1    Follow-up: Return in about 6 months (around 12/14/2023) for Chronic medical conditions.       Hoy Register, MD, FAAFP. [facility] [place], [place] [phone]   06/13/2023, 10:12 AM

## 2023-06-14 LAB — CMP14+EGFR
ALT: 34 IU/L (ref 0–44)
AST: 30 IU/L (ref 0–40)
Albumin: 4.5 g/dL (ref 3.9–4.9)
Alkaline Phosphatase: 64 IU/L (ref 44–121)
BUN/Creatinine Ratio: 14 (ref 10–24)
BUN: 15 mg/dL (ref 8–27)
Bilirubin Total: 1.3 mg/dL — ABNORMAL HIGH (ref 0.0–1.2)
CO2: 24 mmol/L (ref 20–29)
Calcium: 10 mg/dL (ref 8.6–10.2)
Chloride: 101 mmol/L (ref 96–106)
Creatinine, Ser: 1.1 mg/dL (ref 0.76–1.27)
Globulin, Total: 2.7 g/dL (ref 1.5–4.5)
Glucose: 104 mg/dL — ABNORMAL HIGH (ref 70–99)
Potassium: 3.9 mmol/L (ref 3.5–5.2)
Sodium: 139 mmol/L (ref 134–144)
Total Protein: 7.2 g/dL (ref 6.0–8.5)
eGFR: 74 mL/min/{1.73_m2} (ref >59)

## 2023-06-14 LAB — LP+NON-HDL CHOLESTEROL
Cholesterol, Total: 118 mg/dL (ref 100–199)
HDL: 45 mg/dL (ref >39)
LDL Chol Calc (NIH): 60 mg/dL (ref 0–99)
Total Non-HDL-Chol (LDL+VLDL): 73 mg/dL (ref 0–129)
Triglycerides: 58 mg/dL (ref 0–149)
VLDL Cholesterol Cal: 13 mg/dL (ref 5–40)

## 2023-06-15 ENCOUNTER — Other Ambulatory Visit: Payer: Self-pay | Admitting: Physician Assistant

## 2023-06-15 DIAGNOSIS — K219 Gastro-esophageal reflux disease without esophagitis: Secondary | ICD-10-CM

## 2023-06-19 ENCOUNTER — Other Ambulatory Visit: Payer: Self-pay | Admitting: Family Medicine

## 2023-06-19 DIAGNOSIS — M19041 Primary osteoarthritis, right hand: Secondary | ICD-10-CM

## 2023-07-14 ENCOUNTER — Other Ambulatory Visit: Payer: Self-pay | Admitting: Physician Assistant

## 2023-07-14 DIAGNOSIS — I10 Essential (primary) hypertension: Secondary | ICD-10-CM

## 2023-07-16 NOTE — Telephone Encounter (Signed)
Requested Prescriptions  Refused Prescriptions Disp Refills   amLODipine (NORVASC) 10 MG tablet [Pharmacy Med Name: AMLODIPINE BESYLATE 10MG  TABLETS] 90 tablet 1    Sig: TAKE 1 TABLET(10 MG) BY MOUTH DAILY     Cardiovascular: Calcium Channel Blockers 2 Passed - 07/14/2023  3:09 AM      Passed - Last BP in normal range    BP Readings from Last 1 Encounters:  06/13/23 125/71         Passed - Last Heart Rate in normal range    Pulse Readings from Last 1 Encounters:  06/13/23 65         Passed - Valid encounter within last 6 months    Recent Outpatient Visits           1 month ago Prediabetes   [facility] [place] [facility] [place], Odette Horns, MD   5 months ago Medication management   [facility] [place], Cornelius Moras, RPH-CPP   7 months ago Prediabetes   [facility] [place], [place], [place]   1 year ago Screening for diabetes mellitus   [place] [facility] Hoy Register, MD   1 year ago History of herpes zoster virus vaccination   [facility] [place], Cornelius Moras, RPH-CPP       Future Appointments             In 5 months Hoy Register, MD [facility]

## 2023-08-27 ENCOUNTER — Other Ambulatory Visit: Payer: Self-pay | Admitting: Family Medicine

## 2023-08-27 DIAGNOSIS — M19041 Primary osteoarthritis, right hand: Secondary | ICD-10-CM

## 2023-08-28 NOTE — Telephone Encounter (Signed)
Requested medication (s) are due for refill today: yes  Requested medication (s) are on the active medication list: yes  Last refill:  06/19/23  Future visit scheduled: yes  Notes to clinic:  Unable to refill per protocol due to failed labs, no updated results.      Requested Prescriptions  Pending Prescriptions Disp Refills   diclofenac (VOLTAREN) 75 MG EC tablet [Pharmacy Med Name: DICLOFENAC SODIUM 75MG  DR TABLETS] 60 tablet 1    Sig: TAKE 1 TABLET(75 MG) BY MOUTH TWICE DAILY AS NEEDED FOR PAIN     Analgesics:  NSAIDS Failed - 08/27/2023 10:00 AM      Failed - Manual Review: Labs are only required if the patient has taken medication for more than 8 weeks.      Failed - HGB in normal range and within 360 days    Hemoglobin  Date Value Ref Range Status  03/01/2017 15.0 13.0 - 17.7 g/dL Final         Failed - PLT in normal range and within 360 days    Platelets  Date Value Ref Range Status  03/01/2017 229 150 - 379 x10E3/uL Final         Failed - HCT in normal range and within 360 days    Hematocrit  Date Value Ref Range Status  03/01/2017 42.3 37.5 - 51.0 % Final         Passed - Cr in normal range and within 360 days    Creat  Date Value Ref Range Status  03/15/2017 0.98 0.70 - 1.25 mg/dL Final    Comment:      For patients > or = 67 years of age: The upper reference limit for Creatinine is approximately 13% higher for people identified as African-American.      Creatinine, Ser  Date Value Ref Range Status  06/13/2023 1.10 0.76 - 1.27 mg/dL Final         Passed - eGFR is 30 or above and within 360 days    GFR, Est African American  Date Value Ref Range Status  03/15/2017 >89 >=60 mL/min Final   GFR calc Af Amer  Date Value Ref Range Status  07/08/2020 105 >59 mL/min/1.73 Final    Comment:    **[facility] currently reports eGFR in compliance with the current**   recommendations of the [employer]. [facility] will   update reporting as new  guidelines are published from the NKF-ASN   Task force.    GFR, Est Non African American  Date Value Ref Range Status  03/15/2017 83 >=60 mL/min Final   GFR calc non Af Amer  Date Value Ref Range Status  07/08/2020 91 >59 mL/min/1.73 Final   eGFR  Date Value Ref Range Status  06/13/2023 74 >59 mL/min/1.73 Final         Passed - Patient is not pregnant      Passed - Valid encounter within last 12 months    Recent Outpatient Visits           2 months ago Prediabetes   [facility] [place], Odette Horns, MD   7 months ago Medication management   [facility] [place], Cornelius Moras, RPH-CPP   8 months ago Prediabetes   [facility] [place], [place], [place]   1 year ago Screening for diabetes mellitus    [facility] Hoy Register, MD  1 year ago History of herpes zoster virus vaccination   [facility] [place], Cornelius Moras, RPH-CPP       Future Appointments             In 3 months Hoy Register, MD [facility]

## 2023-10-10 ENCOUNTER — Other Ambulatory Visit: Payer: Self-pay | Admitting: Family Medicine

## 2023-10-10 DIAGNOSIS — I10 Essential (primary) hypertension: Secondary | ICD-10-CM

## 2023-10-21 ENCOUNTER — Other Ambulatory Visit: Payer: Self-pay | Admitting: Family Medicine

## 2023-10-21 DIAGNOSIS — M19042 Primary osteoarthritis, left hand: Secondary | ICD-10-CM

## 2023-12-12 ENCOUNTER — Other Ambulatory Visit: Payer: Self-pay | Admitting: Family Medicine

## 2023-12-12 DIAGNOSIS — M19041 Primary osteoarthritis, right hand: Secondary | ICD-10-CM

## 2023-12-15 ENCOUNTER — Other Ambulatory Visit: Payer: Self-pay | Admitting: Family Medicine

## 2023-12-15 DIAGNOSIS — K219 Gastro-esophageal reflux disease without esophagitis: Secondary | ICD-10-CM

## 2023-12-15 DIAGNOSIS — I10 Essential (primary) hypertension: Secondary | ICD-10-CM

## 2023-12-17 ENCOUNTER — Other Ambulatory Visit: Payer: Self-pay | Admitting: Family Medicine

## 2023-12-17 DIAGNOSIS — I10 Essential (primary) hypertension: Secondary | ICD-10-CM

## 2023-12-17 NOTE — Telephone Encounter (Signed)
 Requested medication (s) are due for refill today: yes   Requested medication (s) are on the active medication list: yes   Last refill:  06/13/23 #90 1 refills  Future visit scheduled: yes in 3 days   Notes to clinic:  do you want to refill Rx for #90 ?     Requested Prescriptions  Pending Prescriptions Disp Refills   hydrochlorothiazide (HYDRODIURIL) 25 MG tablet [Pharmacy Med Name: HYDROCHLOROTHIAZIDE 25MG TABLETS] 90 tablet 1    Sig: TAKE 1 TABLET(25 MG) BY MOUTH DAILY     Cardiovascular: Diuretics - Thiazide Failed - 12/17/2023 11:42 AM      Failed - Cr in normal range and within 180 days    Creat  Date Value Ref Range Status  03/15/2017 0.98 0.70 - 1.25 mg/dL Final    Comment:      For patients > or = 68 years of age: The upper reference limit for Creatinine is approximately 13% higher for people identified as African-American.      Creatinine, Ser  Date Value Ref Range Status  06/13/2023 1.10 0.76 - 1.27 mg/dL Final         Failed - K in normal range and within 180 days    Potassium  Date Value Ref Range Status  06/13/2023 3.9 3.5 - 5.2 mmol/L Final         Failed - Na in normal range and within 180 days    Sodium  Date Value Ref Range Status  06/13/2023 139 134 - 144 mmol/L Final         Failed - Valid encounter within last 6 months    Recent Outpatient Visits           6 months ago Prediabetes   [facility] [place] Hoy Register, MD   11 months ago Medication management   [facility] [place]. [facility] Drucilla Chalet, RPH-CPP   1 year ago Prediabetes   [facility] [place] [facility] [place]. [facility], Marzella Schlein, [place]   1 year ago Screening for diabetes mellitus   [facility] [place] [facility] [place]. [facility] Hoy Register, MD   2 years ago History of herpes zoster virus  vaccination   [place] [facility] [place] [facility] Of [place]. [facility] Lois [place], Cornelius Moras, RPH-CPP       Future Appointments             In 3 days Hoy Register, MD [facility] Of [place]igha Bridegroom. [facility]            Passed - Last BP in normal range    BP Readings from Last 1 Encounters:  06/13/23 125/71         Signed Prescriptions Disp Refills   omeprazole (PRILOSEC) 20 MG capsule 90 capsule 1    Sig: TAKE 1 CAPSULE(20 MG) BY MOUTH DAILY     Gastroenterology: Proton Pump Inhibitors Passed - 12/17/2023 11:42 AM      Passed - Valid encounter within last 12 months    Recent Outpatient Visits           6 months ago Prediabetes    [facility] [place]. [facility] Hoy Register, MD  11 months ago Medication management   [place] [place] [facility] Dept Of [place]. [facility] Drucilla Chalet, RPH-CPP   1 year ago Prediabetes   [place] [facility] [place] [facility] [place]. [facility] [place], Marzella [place]lein, [place]   1 year ago Screening for diabetes mellitus   [place] [facility] [place] [facility] Dept Of [place]. [facility] Hoy Register, MD   2 years ago History of herpes zoster virus vaccination   [place] [facility] [place] [facility] Dept Of [place]. [facility] Lois Huxley, Cornelius Moras, RPH-CPP       Future Appointments             In 3 days Hoy Register, MD [facility] - A Dept Of [place]igha Bridegroom. [facility]

## 2023-12-17 NOTE — Telephone Encounter (Signed)
 Requested by interface surescripts. Future visit in 3 days .  Requested Prescriptions  Pending Prescriptions Disp Refills   omeprazole (PRILOSEC) 20 MG capsule [Pharmacy Med Name: OMEPRAZOLE 20MG  CAPSULES] 90 capsule 1    Sig: TAKE 1 CAPSULE(20 MG) BY MOUTH DAILY     Gastroenterology: Proton Pump Inhibitors Passed - 12/17/2023 11:42 AM      Passed - Valid encounter within last 12 months    Recent Outpatient Visits           6 months ago Prediabetes   [facility] [place] [facility] - A Dept Of [place]. [facility] Hoy Register, MD   11 months ago Medication management   [facility] [place] - A Dept Of [place]. [facility] Drucilla Chalet, RPH-CPP   1 year ago Prediabetes   [facility] [place] - A Dept Of [place]. [facility], Marzella Schlein, [place]   1 year ago Screening for diabetes mellitus   [facility] [place] - A Dept Of [place]. [facility] Hoy Register, MD   2 years ago History of herpes zoster virus vaccination   [facility] [place] - A Dept Of [place]. [facility] Lois Huxley, Cornelius Moras, RPH-CPP       Future Appointments             In 3 days Hoy Register, MD [facility] - A Dept Of [place] Bridegroom. [facility]             hydrochlorothiazide (HYDRODIURIL) 25 MG tablet [Pharmacy Med Name: HYDROCHLOROTHIAZIDE 25MG TABLETS] 90 tablet 1    Sig: TAKE 1 TABLET(25 MG) BY MOUTH DAILY     Cardiovascular: Diuretics - Thiazide Failed - 12/17/2023 11:42 AM      Failed - Cr in normal range and within 180 days    Creat  Date Value Ref Range Status  03/15/2017 0.98 0.70 - 1.25 mg/dL Final    Comment:      For patients > or = 68 years of age: The upper reference limit for Creatinine is approximately 13% higher for people identified as African-American.      Creatinine, Ser  Date Value Ref Range Status  06/13/2023 1.10 0.76 - 1.27  mg/dL Final         Failed - K in normal range and within 180 days    Potassium  Date Value Ref Range Status  06/13/2023 3.9 3.5 - 5.2 mmol/L Final         Failed - Na in normal range and within 180 days    Sodium  Date Value Ref Range Status  06/13/2023 139 134 - 144 mmol/L Final         Failed - Valid encounter within last 6 months    Recent Outpatient Visits           6 months ago Prediabetes   [facility] [place] - A Dept Of [place]. [facility] Hoy Register, MD   11 months ago Medication management   [facility] [place] - A Dept Of [place]. [facility] Drucilla Chalet, RPH-CPP   1 year ago Prediabetes   [facility] [place] - A Dept Of [place]. [facility] [place], [place], [place]   1 year ago Screening for diabetes mellitus    [facility] [place] - [facility]  Dept Of [place]. [facility] Hoy Register, MD   2 years ago History of herpes zoster virus vaccination   [facility] [place] - A Dept Of [place]. [facility] Lois Huxley, Cornelius Moras, RPH-CPP       Future Appointments             In 3 days Hoy Register, MD [facility] - A Dept Of [place] Bridegroom. [facility]            Passed - Last BP in normal range    BP Readings from Last 1 Encounters:  06/13/23 125/71

## 2023-12-20 ENCOUNTER — Ambulatory Visit: Payer: 59 | Admitting: Family Medicine

## 2023-12-27 ENCOUNTER — Other Ambulatory Visit: Payer: Self-pay | Admitting: Family Medicine

## 2023-12-27 DIAGNOSIS — E785 Hyperlipidemia, unspecified: Secondary | ICD-10-CM

## 2023-12-28 ENCOUNTER — Other Ambulatory Visit: Payer: Self-pay | Admitting: Family Medicine

## 2023-12-28 DIAGNOSIS — J328 Other chronic sinusitis: Secondary | ICD-10-CM

## 2024-01-02 ENCOUNTER — Ambulatory Visit: Payer: 59 | Attending: Physician Assistant | Admitting: Physician Assistant

## 2024-01-02 VITALS — BP 104/70 | HR 65 | Temp 98.0°F | Ht 71.0 in | Wt 214.0 lb

## 2024-01-02 DIAGNOSIS — E785 Hyperlipidemia, unspecified: Secondary | ICD-10-CM

## 2024-01-02 DIAGNOSIS — Z23 Encounter for immunization: Secondary | ICD-10-CM

## 2024-01-02 DIAGNOSIS — R053 Chronic cough: Secondary | ICD-10-CM

## 2024-01-02 DIAGNOSIS — I10 Essential (primary) hypertension: Secondary | ICD-10-CM | POA: Diagnosis not present

## 2024-01-02 DIAGNOSIS — J328 Other chronic sinusitis: Secondary | ICD-10-CM | POA: Diagnosis not present

## 2024-01-02 DIAGNOSIS — R7303 Prediabetes: Secondary | ICD-10-CM | POA: Diagnosis not present

## 2024-01-02 LAB — GLUCOSE, POCT (MANUAL RESULT ENTRY): POC Glucose: 129 mg/dL — AB (ref 70–99)

## 2024-01-02 LAB — POCT GLYCOSYLATED HEMOGLOBIN (HGB A1C): HbA1c, POC (prediabetic range): 6.2 % (ref 5.7–6.4)

## 2024-01-02 MED ORDER — HYDROCHLOROTHIAZIDE 25 MG PO TABS: 25.0000 mg | ORAL_TABLET | Freq: Every day | ORAL | 1 refills | Status: DC

## 2024-01-02 MED ORDER — ALBUTEROL SULFATE HFA 108 (90 BASE) MCG/ACT IN AERS: 2.0000 | INHALATION_SPRAY | Freq: Four times a day (QID) | RESPIRATORY_TRACT | 1 refills | Status: DC | PRN

## 2024-01-02 MED ORDER — ATORVASTATIN CALCIUM 20 MG PO TABS: ORAL_TABLET | ORAL | 1 refills | Status: DC

## 2024-01-02 MED ORDER — CARVEDILOL 6.25 MG PO TABS: ORAL_TABLET | ORAL | 1 refills | Status: DC

## 2024-01-02 MED ORDER — FLUTICASONE PROPIONATE 50 MCG/ACT NA SUSP: 2.0000 | Freq: Every day | NASAL | 1 refills | Status: DC

## 2024-01-02 MED ORDER — AMLODIPINE BESYLATE 10 MG PO TABS: 10.0000 mg | ORAL_TABLET | Freq: Every day | ORAL | 1 refills | Status: AC

## 2024-01-02 MED ORDER — CETIRIZINE HCL 10 MG PO TABS: 10.0000 mg | ORAL_TABLET | Freq: Every day | ORAL | 1 refills | Status: AC

## 2024-01-02 NOTE — Patient Instructions (Signed)
 Your prediabetes has gone from 6.0 to 6.2.  Work at a goal of eliminating sugary drinks, candy, desserts, sweets, refined sugars, processed foods, and white carbohydrates to avoid developing diabetes.   Increase your water intake to 64 to 80 ounces daily.

## 2024-01-02 NOTE — Progress Notes (Signed)
 Patient ID: Dustin [name], male   DOB: [birth year], 68 y.o.   MRN: [id number]   Dustin [name], is a 69 y.o. male  [id number]  [id number]  DOB - Apr 25, 1956  Chief Complaint  Patient presents with   Hypertension    HTN f/u. Med refill.  No questions / concerns Yes to flu vax       Subjective:   Dustin [name] is a 68 y.o. male here today for medication RF.  No new issues or concerns today.  He would like a flu shot.  He denies CP/SOB/fever/dizziness.     No problems updated.  ALLERGIES: No Known Allergies  PAST MEDICAL HISTORY: Past Medical History:  Diagnosis Date   Arthritis    bil knees, left hand   Carcinoid tumor    DVT of upper extremity (deep vein thrombosis) (HCC)    left brachial vein, 12/2010   Helicobacter pylori gastritis    History of blood clots    to left arm   Hypertension    Shortness of breath dyspnea    with exertion   Tubular adenoma of colon 2017    MEDICATIONS AT HOME: Prior to Admission medications   Medication Sig Start Date End Date Taking? Authorizing Provider  ASPIRIN 81 PO Take by mouth.   Yes [provider]  diclofenac (VOLTAREN) 75 MG EC tablet TAKE 1 TABLET(75 MG) BY MOUTH TWICE DAILY AS NEEDED FOR PAIN 12/12/23  Yes Hoy Register, MD  diclofenac Sodium (VOLTAREN) 1 % GEL APPLY 4 GRAMS EXTERNALLY TO THE AFFECTED AREA FOUR TIMES DAILY 12/13/22  Yes Georgian Co M, PA-C  vitamin E 180 MG (400 UNITS) capsule Take 800 Units by mouth daily.   Yes [provider]  albuterol (VENTOLIN HFA) 108 (90 Base) MCG/ACT inhaler Inhale 2 puffs into the lungs every 6 (six) hours as needed for wheezing or shortness of breath. 01/02/24   Anders Simmonds, PA-C  amLODipine (NORVASC) 10 MG tablet Take 1 tablet (10 mg total) by mouth daily. 01/02/24   Anders Simmonds, PA-C  atorvastatin (LIPITOR) 20 MG tablet TAKE 1 TABLET(20 MG) BY MOUTH DAILY 01/02/24   Georgian Co M, PA-C  carvedilol (COREG) 6.25 MG tablet TAKE 1 TABLET(6.25  MG) BY MOUTH TWICE DAILY WITH A MEAL 01/02/24   Shonnie Poudrier, Marylene Land M, PA-C  cetirizine (ZYRTEC) 10 MG tablet Take 1 tablet (10 mg total) by mouth daily. 01/02/24   Anders Simmonds, PA-C  fluticasone (FLONASE) 50 MCG/ACT nasal spray Place 2 sprays into both nostrils daily. 01/02/24   Anders Simmonds, PA-C  hydrochlorothiazide (HYDRODIURIL) 25 MG tablet Take 1 tablet (25 mg total) by mouth daily. 01/02/24   Anders Simmonds, PA-C  omeprazole (PRILOSEC) 20 MG capsule TAKE 1 CAPSULE(20 MG) BY MOUTH DAILY Patient not taking: Reported on 01/02/2024 12/17/23   Hoy Register, MD    ROS: Neg HEENT Neg resp Neg cardiac Neg GI Neg GU Neg MS Neg psych Neg neuro  Objective:   Vitals:   01/02/24 0924  BP: 104/70  Pulse: 65  Temp: 98 F (36.7 C)  TempSrc: Oral  SpO2: 98%  Weight: 214 lb (97.1 kg)  Height: 5\' 11"  (1.803 m)   Exam General appearance : Awake, alert, not in any distress. Speech Clear. Not toxic looking HEENT: Atraumatic and Normocephalic Neck: Supple, no JVD. No cervical lymphadenopathy.  Chest: Good air entry bilaterally, CTAB.  No rales/rhonchi/wheezing CVS: S1 S2 regular, no murmurs.  Extremities: B/L Lower Ext shows no edema, both legs  are warm to touch Neurology: Awake alert, and oriented X 3, CN II-XII intact, Non focal Skin: No Rash  Data Review Lab Results  Component Value Date   HGBA1C 6.2 01/02/2024   HGBA1C 6.0 06/13/2023   HGBA1C 6.1 (H) 12/13/2022    Assessment & Plan   1. Prediabetes (Primary) work at a goal of eliminating sugary drinks, candy, desserts, sweets, refined sugars, processed foods, and white carbohydrates.  - Glucose (CBG) - HgB A1c  2. Essential hypertension, benign controlled - amLODipine (NORVASC) 10 MG tablet; Take 1 tablet (10 mg total) by mouth daily.  Dispense: 90 tablet; Refill: 1 - carvedilol (COREG) 6.25 MG tablet; TAKE 1 TABLET(6.25 MG) BY MOUTH TWICE DAILY WITH A MEAL  Dispense: 180 tablet; Refill: 1 - hydrochlorothiazide  (HYDRODIURIL) 25 MG tablet; Take 1 tablet (25 mg total) by mouth daily.  Dispense: 90 tablet; Refill: 1  3. Hyperlipidemia LDL goal <100 - atorvastatin (LIPITOR) 20 MG tablet; TAKE 1 TABLET(20 MG) BY MOUTH DAILY  Dispense: 90 tablet; Refill: 1  4. Chronic cough - albuterol (VENTOLIN HFA) 108 (90 Base) MCG/ACT inhaler; Inhale 2 puffs into the lungs every 6 (six) hours as needed for wheezing or shortness of breath.  Dispense: 17 g; Refill: 1  5. Other chronic sinusitis - cetirizine (ZYRTEC) 10 MG tablet; Take 1 tablet (10 mg total) by mouth daily.  Dispense: 90 tablet; Refill: 1 - fluticasone (FLONASE) 50 MCG/ACT nasal spray; Place 2 sprays into both nostrils daily.  Dispense: 16 g; Refill: 1  6. Need for influenza vaccination - Flu vaccine trivalent PF, 6mos and older(Flulaval,Afluria,Fluarix,Fluzone)  Reviewed last labs  Return in about 4 months (around 05/03/2024) for PCP for chronic conditions.  The patient was given clear instructions to go to ER or return to medical center if symptoms don't improve, worsen or new problems develop. The patient verbalized understanding. The patient was told to call to get lab results if they haven't heard anything in the next week.      Georgian Co, PA-C [facility], [place] [phone]   01/02/2024, 9:49 AM

## 2024-03-25 ENCOUNTER — Ambulatory Visit: Payer: 59 | Attending: Family Medicine

## 2024-03-25 ENCOUNTER — Other Ambulatory Visit: Payer: Self-pay | Admitting: Family Medicine

## 2024-03-25 VITALS — Ht 71.0 in | Wt 210.0 lb

## 2024-03-25 DIAGNOSIS — E785 Hyperlipidemia, unspecified: Secondary | ICD-10-CM

## 2024-03-25 DIAGNOSIS — Z0000 Encounter for general adult medical examination without abnormal findings: Secondary | ICD-10-CM | POA: Diagnosis not present

## 2024-03-25 NOTE — Progress Notes (Signed)
 Because this visit was a virtual/telehealth visit,  certain criteria was not obtained, such a blood pressure, CBG if applicable, and timed get up and go. Any medications not marked as "taking" were not mentioned during the medication reconciliation part of the visit. Any vitals not documented were not able to be obtained due to this being a telehealth visit or patient was unable to self-report a recent blood pressure reading due to a lack of equipment at home via telehealth. Vitals that have been documented are verbally provided by the patient.   Subjective:   Dustin [name] is a 68 y.o. who presents for a Medicare Wellness preventive visit.  As a reminder, Annual Wellness Visits don't include a physical exam, and some assessments may be limited, especially if this visit is performed virtually. We may recommend an in-person follow-up visit with your provider if needed.  Visit Complete: Virtual I connected with  Dustin [name] on 03/25/24 by a audio enabled telemedicine application and verified that I am speaking with the correct person using two identifiers.  Patient Location: Home  Provider Location: Office/Clinic  I discussed the limitations of evaluation and management by telemedicine. The patient expressed understanding and agreed to proceed.  Vital Signs: Because this visit was a virtual/telehealth visit, some criteria may be missing or patient reported. Any vitals not documented were not able to be obtained and vitals that have been documented are patient reported.  VideoDeclined- This patient declined Librarian, academic. Therefore the visit was completed with audio only.  Persons Participating in Visit: Patient.  AWV Questionnaire: No: Patient Medicare AWV questionnaire was not completed prior to this visit.  Cardiac Risk Factors include: advanced age (>53men, >73 women);dyslipidemia;hypertension;male gender     Objective:     Today's Vitals    03/25/24 1454  Weight: 210 lb (95.3 kg)  Height: 5\' 11"  (1.803 m)  PainSc: 0-No pain   Body mass index is 29.29 kg/m.     03/25/2024    2:55 PM 04/18/2023    9:54 AM 06/16/2022    2:29 PM 04/28/2021    8:17 AM 02/13/2021   10:59 AM 07/04/2020    8:58 AM 03/01/2017    8:40 AM  Advanced Directives  Does Patient Have a Medical Advance Directive? No No No No No No No  Would patient like information on creating a medical advance directive? No - Patient declined Yes (MAU/Ambulatory/Procedural Areas - Information given) No - Patient declined No - Patient declined No - Patient declined No - Patient declined No - Patient declined    Current Medications (verified) Outpatient Encounter Medications as of 03/25/2024  Medication Sig   albuterol  (VENTOLIN  HFA) 108 (90 Base) MCG/ACT inhaler Inhale 2 puffs into the lungs every 6 (six) hours as needed for wheezing or shortness of breath.   amLODipine  (NORVASC ) 10 MG tablet Take 1 tablet (10 mg total) by mouth daily.   ASPIRIN  81 PO Take by mouth.   atorvastatin  (LIPITOR) 20 MG tablet TAKE 1 TABLET(20 MG) BY MOUTH DAILY   carvedilol  (COREG ) 6.25 MG tablet TAKE 1 TABLET(6.25 MG) BY MOUTH TWICE DAILY WITH A MEAL   cetirizine  (ZYRTEC ) 10 MG tablet Take 1 tablet (10 mg total) by mouth daily.   diclofenac  (VOLTAREN ) 75 MG EC tablet TAKE 1 TABLET(75 MG) BY MOUTH TWICE DAILY AS NEEDED FOR PAIN   diclofenac  Sodium (VOLTAREN ) 1 % GEL APPLY 4 GRAMS EXTERNALLY TO THE AFFECTED AREA FOUR TIMES DAILY   fluticasone  (FLONASE ) 50 MCG/ACT  nasal spray Place 2 sprays into both nostrils daily.   hydrochlorothiazide  (HYDRODIURIL ) 25 MG tablet Take 1 tablet (25 mg total) by mouth daily.   omeprazole  (PRILOSEC) 20 MG capsule TAKE 1 CAPSULE(20 MG) BY MOUTH DAILY (Patient not taking: Reported on 01/02/2024)   vitamin E 180 MG (400 UNITS) capsule Take 800 Units by mouth daily.   No facility-administered encounter medications on file as of 03/25/2024.    Allergies (verified) Patient  has no known allergies.   History: Past Medical History:  Diagnosis Date   Arthritis    bil knees, left hand   Carcinoid tumor    DVT of upper extremity (deep vein thrombosis) (HCC)    left brachial vein, 12/2010   Helicobacter pylori gastritis    History of blood clots    to left arm   Hypertension    Shortness of breath dyspnea    with exertion   Tubular adenoma of colon 2017   Past Surgical History:  Procedure Laterality Date   colonscopy      1 year ago    KNEE ARTHROPLASTY Right 04/30/2015   Procedure: COMPUTER ASSISTED TOTAL KNEE ARTHROPLASTY;  Surgeon: Adah Acron, MD;  Location: [facility] OR;  Service: Orthopedics;  Laterality: Right;   Left  Hand   2011   cyst removed.   TOTAL KNEE ARTHROPLASTY  10/19/2011   Procedure: TOTAL KNEE ARTHROPLASTY;  Surgeon: Jonna Netter;  Location: [facility] OR;  Service: Orthopedics;  Laterality: Left;   Family History  Problem Relation Age of Onset   Cancer Mother    Cancer Father        prostate cancer   Diabetes Other    Hypertension Other    Diabetes Maternal Uncle    Anesthesia problems Neg Hx    Hypotension Neg Hx    Malignant hyperthermia Neg Hx    Pseudochol deficiency Neg Hx    Social History   Socioeconomic History   Marital status: Single    Spouse name: Not on file   Number of children: Not on file   Years of education: Not on file   Highest education level: Not on file  Occupational History   Not on file  Tobacco Use   Smoking status: Never   Smokeless tobacco: Never  Substance and Sexual Activity   Alcohol use: No    Alcohol/week: 0.0 standard drinks of alcohol   Drug use: No   Sexual activity: Yes  Other Topics Concern   Not on file  Social History Narrative   Not on file   Social Drivers of Health   Financial Resource Strain: Low Risk  (03/25/2024)   Overall Financial Resource Strain (CARDIA)    Difficulty of Paying Living Expenses: Not hard at all  Food Insecurity: No Food Insecurity (03/25/2024)   Hunger  Vital Sign    Worried About Running Out of Food in the Last Year: Never true    Ran Out of Food in the Last Year: Never true  Transportation Needs: No Transportation Needs (03/25/2024)   PRAPARE - Administrator, Civil Service (Medical): No    Lack of Transportation (Non-Medical): No  Physical Activity: Sufficiently Active (03/25/2024)   Exercise Vital Sign    Days of Exercise per Week: 5 days    Minutes of Exercise per Session: 30 min  Recent Concern: Physical Activity - Insufficiently Active (01/02/2024)   Exercise Vital Sign    Days of Exercise per Week: 3 days    Minutes  of Exercise per Session: 30 min  Stress: No Stress Concern Present (03/25/2024)   [employer] of Occupational Health - Occupational Stress Questionnaire    Feeling of Stress : Not at all  Social Connections: Moderately Integrated (03/25/2024)   Social Connection and Isolation Panel [NHANES]    Frequency of Communication with Friends and Family: Twice a week    Frequency of Social Gatherings with Friends and Family: Twice a week    Attends Religious Services: More than 4 times per year    Active Member of [employer] or Organizations: Yes    Attends Banker Meetings: 1 to 4 times per year    Marital Status: Never married    Tobacco Counseling Counseling given: Not Answered    Clinical Intake:  Pre-visit preparation completed: Yes  Pain : No/denies pain Pain Score: 0-No pain     BMI - recorded: 29.29 Nutritional Status: BMI 25 -29 Overweight Nutritional Risks: None Diabetes: No  Lab Results  Component Value Date   HGBA1C 6.2 01/02/2024   HGBA1C 6.0 06/13/2023   HGBA1C 6.1 (H) 12/13/2022     How often do you need to have someone help you when you read instructions, pamphlets, or other written materials from your doctor or pharmacy?: 1 - Never  Interpreter Needed?: No  Information entered by :: Ollis Daudelin N. Ivyrose Hashman, LPN.   Activities of Daily Living     03/25/2024     2:57 PM 04/18/2023    9:52 AM  In your present state of health, do you have any difficulty performing the following activities:  Hearing? 0 0  Vision? 0 0  Difficulty concentrating or making decisions? 0 0  Walking or climbing stairs? 0 0  Dressing or bathing? 0 0  Doing errands, shopping? 0 0  Preparing Food and eating ? N N  Using the Toilet? N N  In the past six months, have you accidently leaked urine? N N  Do you have problems with loss of bowel control? N N  Managing your Medications? N N  Managing your Finances? N N  Housekeeping or managing your Housekeeping? N N    Patient Care Team: Joaquin Mulberry, MD as PCP - General (Family Medicine) Buck Carbon, My Donnel[place], [place] as Referring Physician (Optometry)  Indicate any recent Medical Services you may have received from other than [facility] providers in the past year (date may be approximate).     Assessment:    This is a routine wellness examination for Tannar.  Hearing/Vision screen Hearing Screening - Comments:: Denies hearing difficulties.  Vision Screening - Comments:: Wears rx glasses - up to date with routine eye exams with Dr. My Beatrix Bougie    Goals Addressed             This Visit's Progress    Keep maintaining my health, staying independent and active.         Depression Screen     03/25/2024    2:56 PM 01/02/2024    9:54 AM 04/18/2023    9:53 AM 01/18/2023   11:00 AM 12/13/2022    9:09 AM 06/16/2022    2:29 PM 06/07/2022    8:43 AM  PHQ 2/9 Scores  PHQ - 2 Score 0 0 0 0 0 0 0  PHQ- 9 Score 0 0   0  0    Fall Risk     03/25/2024    2:55 PM 06/13/2023    9:05 AM 04/18/2023    9:55 AM 06/16/2022  2:29 PM 06/07/2022    8:42 AM  Fall Risk   Falls in the past year? 0 0 0 0 0  Number falls in past yr: 0 0 0 0 0  Injury with Fall? 0 0 0 0 0  Risk for fall due to : No Fall Risks No Fall Risks No Fall Risks No Fall Risks   Follow up Falls evaluation completed  Falls prevention discussed;Education provided;Falls evaluation  completed      MEDICARE RISK AT HOME:  Medicare Risk at Home Any stairs in or around the home?: No If so, are there any without handrails?: No Home free of loose throw rugs in walkways, pet beds, electrical cords, etc?: Yes Adequate lighting in your home to reduce risk of falls?: Yes Life alert?: No Use of a cane, walker or w/c?: No Grab bars in the bathroom?: Yes Shower chair or bench in shower?: No Elevated toilet seat or a handicapped toilet?: No  TIMED UP AND GO:  Was the test performed?  No  Cognitive Function: 6CIT completed    03/25/2024    2:57 PM 06/16/2022    2:31 PM 04/28/2021    8:20 AM  MMSE - Mini Mental State Exam  Not completed: Unable to complete    Orientation to time  5 5  Orientation to Place  5 5  Registration  3 3  Attention/ Calculation  5 5  Recall  3 3  Language- name 2 objects  2 2  Language- repeat  1 1  Language- follow 3 step command  3 3  Language- read & follow direction  1 1  Write a sentence  1 1  Copy design  1 1  Total score  30 30        03/25/2024    2:57 PM 04/18/2023    9:55 AM 06/16/2022    2:32 PM  6CIT Screen  What Year? 0 points 0 points 0 points  What month? 0 points 0 points 0 points  What time? 0 points 0 points 0 points  Count back from 20 0 points 0 points 0 points  Months in reverse 0 points 0 points 0 points  Repeat phrase 0 points 0 points 0 points  Total Score 0 points 0 points 0 points    Immunizations Immunization History  Administered Date(s) Administered   Hepatitis B, ADULT 01/12/2016, 04/27/2016, 09/14/2016   Influenza, Quadrivalent, Recombinant, Inj, Pf 07/29/2019   Influenza, Seasonal, Injecte, Preservative Fre 01/02/2024   Influenza,inj,Quad PF,6+ Mos 11/17/2014, 08/31/2015, 07/04/2016, 10/12/2017, 07/08/2020, 09/21/2021   PFIZER(Purple Top)SARS-COV-2 Vaccination 01/09/2020, 02/02/2020   PNEUMOCOCCAL CONJUGATE-20 06/07/2022   Pneumococcal Polysaccharide-23 09/14/2016   Tdap 11/18/2015    Unspecified SARS-COV-2 Vaccination 03/23/2021   Zoster Recombinant(Shingrix) 05/23/2018, 03/23/2021    Screening Tests Health Maintenance  Topic Date Due   COVID-19 Vaccine (4 - 2024-25 season) 07/01/2023   INFLUENZA VACCINE  05/30/2024   Medicare Annual Wellness (AWV)  03/25/2025   Fecal DNA (Cologuard)  06/12/2025   DTaP/Tdap/Td (2 - Td or Tdap) 11/17/2025   Pneumonia Vaccine 24+ Years old  Completed   Hepatitis C Screening  Completed   Zoster Vaccines- Shingrix  Completed   HPV VACCINES  Aged Out   Meningococcal B Vaccine  Aged Out   Colonoscopy  Discontinued    Health Maintenance  Health Maintenance Due  Topic Date Due   COVID-19 Vaccine (4 - 2024-25 season) 07/01/2023   Health Maintenance Items Addressed: Yes    Additional Screening:  Vision  Screening: Recommended annual ophthalmology exams for early detection of glaucoma and other disorders of the eye.  Dental Screening: Recommended annual dental exams for proper oral hygiene  Community Resource Referral / Chronic Care Management: CRR required this visit?  No   CCM required this visit?  No   Plan:    I have personally reviewed and noted the following in the patient's chart:   Medical and social history Use of alcohol, tobacco or illicit drugs  Current medications and supplements including opioid prescriptions. Patient is not currently taking opioid prescriptions. Functional ability and status Nutritional status Physical activity Advanced directives List of other physicians Hospitalizations, surgeries, and ER visits in previous 12 months Vitals Screenings to include cognitive, depression, and falls Referrals and appointments  In addition, I have reviewed and discussed with patient certain preventive protocols, quality metrics, and best practice recommendations. A written personalized care plan for preventive services as well as general preventive health recommendations were provided to  patient.   Margette Sheldon, LPN   [id number]   After Visit Summary: (MyChart) Due to this being a telephonic visit, the after visit summary with patients personalized plan was offered to patient via MyChart   Notes: Nothing significant to report at this time.

## 2024-03-25 NOTE — Patient Instructions (Addendum)
 Dustin [name] , Thank you for taking time out of your busy schedule to complete your Annual Wellness Visit with me. I enjoyed our conversation and look forward to speaking with you again next year. I, as well as your care team,  appreciate your ongoing commitment to your health goals. Please review the following plan we discussed and let me know if I can assist you in the future. Your Game plan/ To Do List    Referrals: If you haven't heard from the office you've been referred to, please reach out to them at the phone provided.   Follow up Visits: Next Medicare AWV with our clinical staff: 03/31/2025 at 3:30 pm phone visit with Nurse Health Advisor   Have you seen your provider in the last 6 months (3 months if uncontrolled diabetes)? Yes Next Office Visit with your provider: 05/05/2024 at 8:30 am office visit with Dr. Adan Holms  Clinician Recommendations:  Aim for 30 minutes of exercise or brisk walking, 6-8 glasses of water, and 5 servings of fruits and vegetables each day.       This is a list of the screening recommended for you and due dates:  Health Maintenance  Topic Date Due   COVID-19 Vaccine (4 - 2024-25 season) 07/01/2023   Flu Shot  05/30/2024   Medicare Annual Wellness Visit  03/25/2025   Cologuard (Stool DNA test)  06/12/2025   DTaP/Tdap/Td vaccine (2 - Td or Tdap) 11/17/2025   Pneumonia Vaccine  Completed   Hepatitis C Screening  Completed   Zoster (Shingles) Vaccine  Completed   HPV Vaccine  Aged Out   Meningitis B Vaccine  Aged Out   Colon Cancer Screening  Discontinued    Advanced directives: (Declined) Advance directive discussed with you today. Even though you declined this today, please call our office should you change your mind, and we can give you the proper paperwork for you to fill out. Advance Care Planning is important because it:  [x]  Makes sure you receive the medical care that is consistent with your values, goals, and preferences  [x]  It provides guidance to  your family and loved ones and reduces their decisional burden about whether or not they are making the right decisions based on your wishes.  Follow the link provided in your after visit summary or read over the paperwork we have mailed to you to help you started getting your Advance Directives in place. If you need assistance in completing these, please reach out to us  so that we can help you!  See attachments for Preventive Care and Fall Prevention Tips.

## 2024-04-01 ENCOUNTER — Other Ambulatory Visit: Payer: Self-pay | Admitting: Family Medicine

## 2024-04-01 DIAGNOSIS — I10 Essential (primary) hypertension: Secondary | ICD-10-CM

## 2024-04-01 NOTE — Telephone Encounter (Unsigned)
 Copied from CRM [id number]. Topic: Clinical - Medication Refill >> Apr 01, 2024  3:23 PM Yolanda T wrote: Medication: amLODipine  (NORVASC ) 10 MG tablet  Has the patient contacted their pharmacy? Yes  This is the patient's preferred pharmacy:  [facility] [phone] - [place], [place] - [address] AT [facility] [address] [place] [address] Phone: [phone] Fax: [phone]  Is this the correct pharmacy for this prescription? Yes If no, delete pharmacy and type the correct one.   Has the prescription been filled recently? Yes  Is the patient out of the medication? Yes  Has the patient been seen for an appointment in the last year OR does the patient have an upcoming appointment? Yes  Can we respond through MyChart? No  Agent: Please be advised that Rx refills may take up to 3 business days. We ask that you follow-up with your pharmacy.

## 2024-04-02 NOTE — Telephone Encounter (Signed)
 Rx 01/02/24 #90 1RF- too soon Requested Prescriptions  Pending Prescriptions Disp Refills   amLODipine  (NORVASC ) 10 MG tablet 90 tablet 1    Sig: Take 1 tablet (10 mg total) by mouth daily.     Cardiovascular: Calcium  Channel Blockers 2 Passed - 04/02/2024  3:44 PM      Passed - Last BP in normal range    BP Readings from Last 1 Encounters:  01/02/24 104/70         Passed - Last Heart Rate in normal range    Pulse Readings from Last 1 Encounters:  01/02/24 65         Passed - Valid encounter within last 6 months    Recent Outpatient Visits           3 months ago Prediabetes   [place] [place]. [facility], Shelvy Dickens M, [place]   9 months ago Prediabetes   [place] [facility] [facility] - A Dept Of [place]. [facility] Joaquin Mulberry, MD   1 year ago Medication management   [place] [facility] Dept Of [place]. [facility] Valente Gaskin, RPH-CPP   1 year ago Prediabetes   [place] [facility] [place] - A Dept Of [place]. [facility], Stan Eans, [place]   1 year ago Screening for diabetes mellitus    [facility] [place] - A Dept Of [place]. [facility] Joaquin Mulberry, MD       Future Appointments             In 1 month Joaquin Mulberry, MD [facility] [place] - A Dept Of [place]. [facility]

## 2024-04-03 ENCOUNTER — Other Ambulatory Visit: Payer: Self-pay | Admitting: Physician Assistant

## 2024-04-03 DIAGNOSIS — R053 Chronic cough: Secondary | ICD-10-CM

## 2024-04-09 ENCOUNTER — Emergency Department (HOSPITAL_COMMUNITY)
Admission: EM | Admit: 2024-04-09 | Discharge: 2024-04-09 | Disposition: A | Discharge status: Home / Self Care | Attending: Emergency Medicine | Admitting: Emergency Medicine

## 2024-04-09 ENCOUNTER — Emergency Department (HOSPITAL_COMMUNITY)

## 2024-04-09 ENCOUNTER — Other Ambulatory Visit: Payer: Self-pay

## 2024-04-09 ENCOUNTER — Encounter (HOSPITAL_COMMUNITY): Payer: Self-pay

## 2024-04-09 DIAGNOSIS — R079 Chest pain, unspecified: Secondary | ICD-10-CM | POA: Diagnosis not present

## 2024-04-09 DIAGNOSIS — Z7982 Long term (current) use of aspirin: Secondary | ICD-10-CM | POA: Insufficient documentation

## 2024-04-09 DIAGNOSIS — R062 Wheezing: Secondary | ICD-10-CM | POA: Diagnosis not present

## 2024-04-09 DIAGNOSIS — R059 Cough, unspecified: Secondary | ICD-10-CM | POA: Diagnosis present

## 2024-04-09 DIAGNOSIS — R053 Chronic cough: Secondary | ICD-10-CM | POA: Insufficient documentation

## 2024-04-09 DIAGNOSIS — Z7901 Long term (current) use of anticoagulants: Secondary | ICD-10-CM | POA: Diagnosis not present

## 2024-04-09 DIAGNOSIS — I10 Essential (primary) hypertension: Secondary | ICD-10-CM | POA: Insufficient documentation

## 2024-04-09 DIAGNOSIS — Z79899 Other long term (current) drug therapy: Secondary | ICD-10-CM | POA: Insufficient documentation

## 2024-04-09 DIAGNOSIS — I4892 Unspecified atrial flutter: Secondary | ICD-10-CM | POA: Insufficient documentation

## 2024-04-09 DIAGNOSIS — E876 Hypokalemia: Secondary | ICD-10-CM | POA: Diagnosis not present

## 2024-04-09 LAB — HEPATIC FUNCTION PANEL
ALT: 20 U/L (ref 0–44)
AST: 28 U/L (ref 15–41)
Albumin: 4.2 g/dL (ref 3.5–5.0)
Alkaline Phosphatase: 46 U/L (ref 38–126)
Bilirubin, Direct: 0.1 mg/dL (ref 0.0–0.2)
Indirect Bilirubin: 0.9 mg/dL (ref 0.3–0.9)
Total Bilirubin: 1 mg/dL (ref 0.0–1.2)
Total Protein: 7.9 g/dL (ref 6.5–8.1)

## 2024-04-09 LAB — BASIC METABOLIC PANEL WITH GFR
Anion gap: 10 (ref 5–15)
BUN: 9 mg/dL (ref 8–23)
CO2: 24 mmol/L (ref 22–32)
Calcium: 9.2 mg/dL (ref 8.9–10.3)
Chloride: 101 mmol/L (ref 98–111)
Creatinine, Ser: 0.87 mg/dL (ref 0.61–1.24)
GFR, Estimated: >60 mL/min (ref >60)
Glucose, Bld: 129 mg/dL — ABNORMAL HIGH (ref 70–99)
Potassium: 3 mmol/L — ABNORMAL LOW (ref 3.5–5.1)
Sodium: 135 mmol/L (ref 135–145)

## 2024-04-09 LAB — TROPONIN I (HIGH SENSITIVITY)
Troponin I (High Sensitivity): 23 ng/L — ABNORMAL HIGH (ref <18)
Troponin I (High Sensitivity): 20 ng/L — ABNORMAL HIGH (ref <18)

## 2024-04-09 LAB — CBC
HCT: 43.8 % (ref 39.0–52.0)
Hemoglobin: 14.7 g/dL (ref 13.0–17.0)
MCH: 30 pg (ref 26.0–34.0)
MCHC: 33.6 g/dL (ref 30.0–36.0)
MCV: 89.4 fL (ref 80.0–100.0)
Platelets: 246 10*3/uL (ref 150–400)
RBC: 4.9 MIL/uL (ref 4.22–5.81)
RDW: 13.2 % (ref 11.5–15.5)
WBC: 4.8 10*3/uL (ref 4.0–10.5)
nRBC: 0 % (ref 0.0–0.2)

## 2024-04-09 LAB — MAGNESIUM: Magnesium: 2 mg/dL (ref 1.7–2.4)

## 2024-04-09 LAB — LIPASE, BLOOD: Lipase: 39 U/L (ref 11–51)

## 2024-04-09 MED ORDER — IPRATROPIUM-ALBUTEROL 0.5-2.5 (3) MG/3ML IN SOLN
3.0000 mL | Freq: Once | RESPIRATORY_TRACT | Status: AC
  Administered 2024-04-09: 3 mL via RESPIRATORY_TRACT
  Filled 2024-04-09: qty 3

## 2024-04-09 MED ORDER — PREDNISONE 20 MG PO TABS
60.0000 mg | ORAL_TABLET | Freq: Once | ORAL | Status: AC
  Administered 2024-04-09: 60 mg via ORAL
  Filled 2024-04-09: qty 3

## 2024-04-09 MED ORDER — PREDNISONE 10 MG PO TABS: 40.0000 mg | ORAL_TABLET | Freq: Every day | ORAL | 0 refills | Status: DC

## 2024-04-09 MED ORDER — POTASSIUM CHLORIDE CRYS ER 20 MEQ PO TBCR
40.0000 meq | EXTENDED_RELEASE_TABLET | Freq: Once | ORAL | Status: AC
  Administered 2024-04-09: 40 meq via ORAL
  Filled 2024-04-09: qty 2

## 2024-04-09 MED ORDER — APIXABAN 5 MG PO TABS: 5.0000 mg | ORAL_TABLET | Freq: Two times a day (BID) | ORAL | 0 refills | Status: DC

## 2024-04-09 NOTE — ED Notes (Signed)
 Patient transported to CT

## 2024-04-09 NOTE — ED Triage Notes (Signed)
 Pt has been coughing and vomiting for 2 months. Denies abd pain. C/O intermittent wheezing/ CP.

## 2024-04-09 NOTE — Discharge Instructions (Addendum)
 You were seen in the emergency department for your cough and shortness of breath.  You did have some wheezing here that improved with steroids and nebulizer treatments, you had no signs of pneumonia on your chest x-ray.  You were also incidentally found to be in a abnormal heart rhythm called atrial flutter.  This puts you at increased risk of stroke so I have given you a prescription of blood thinners to reduce your risk of stroke and you should take this as prescribed.  I have given you a referral to the A-fib clinic and they should be calling you to schedule follow-up for further treatment.  I have also given you a prescription of steroids to take for the next 4 days and you can continue to use your albuterol  inhaler as needed.  You can follow-up with your primary doctor regarding your breathing.  You should return to the emergency department if you are having significantly worsening shortness of breath, severe chest pain, if your heart rate becomes significantly elevated and does not go back to normal on its own, you pass out or if you have any other new or concerning symptoms.

## 2024-04-09 NOTE — ED Notes (Signed)
 Lab to add on magnesium to previously sent blood

## 2024-04-09 NOTE — ED Provider Notes (Signed)
 [place] EMERGENCY DEPARTMENT AT [facility] Provider Note   CSN: [id number] Arrival date & time: 04/09/24  4782     History  Chief Complaint  Patient presents with   Emesis    Dustin [name] is a 68 y.o. male.  Patient is a 68 year old male with a past medical history of hypertension, GERD, allergies, cirrhosis, chronic cough presenting to the emergency department with cough.  Patient states over the last 2 months his cough has been worsening.  He states about 2 weeks ago he coughed so hard it made him vomit and this morning he felt like he was going to vomit again which prompted him to come to the emergency department.  He states he does not have any nausea.  He states that his cough is nonproductive.  He denies any fevers or shortness of breath.  He states he only has chest pain when he is coughing hard.  He denies any associated abdominal pain or lower extremity swelling.  He states that he has been using his inhaler at home without significant relief.  He states that he has never been diagnosed with asthma or COPD and has never been a smoker.  He states that he does hear wheezing and rattling in his lungs when he breathes though.  The history is provided by the patient.  Emesis      Home Medications Prior to Admission medications   Medication Sig Start Date End Date Taking? Authorizing Provider  apixaban (ELIQUIS) 5 MG TABS tablet Take 1 tablet (5 mg total) by mouth 2 (two) times daily. 04/09/24 05/09/24 Yes Nora Beal, Liliyana Thobe K, DO  predniSONE (DELTASONE) 10 MG tablet Take 4 tablets (40 mg total) by mouth daily. 04/09/24  Yes Nora Beal, Mckaylin Bastien K, DO  albuterol  (VENTOLIN  HFA) 108 (90 Base) MCG/ACT inhaler INHALE 2 PUFFS INTO THE LUNGS EVERY 6 HOURS AS NEEDED FOR WHEEZING OR SHORTNESS OF BREATH 04/03/24   Newlin, Enobong, MD  amLODipine  (NORVASC ) 10 MG tablet Take 1 tablet (10 mg total) by mouth daily. 01/02/24   Hassie Lint, PA-C  ASPIRIN  81 PO Take by mouth.     [provider]  atorvastatin  (LIPITOR) 20 MG tablet TAKE 1 TABLET(20 MG) BY MOUTH DAILY 03/26/24   Newlin, Enobong, MD  carvedilol  (COREG ) 6.25 MG tablet TAKE 1 TABLET(6.25 MG) BY MOUTH TWICE DAILY WITH A MEAL 01/02/24   Dulce Gibbs M, PA-C  cetirizine  (ZYRTEC ) 10 MG tablet Take 1 tablet (10 mg total) by mouth daily. 01/02/24   Hassie Lint, PA-C  diclofenac  (VOLTAREN ) 75 MG EC tablet TAKE 1 TABLET(75 MG) BY MOUTH TWICE DAILY AS NEEDED FOR PAIN 12/12/23   Newlin, Enobong, MD  diclofenac  Sodium (VOLTAREN ) 1 % GEL APPLY 4 GRAMS EXTERNALLY TO THE AFFECTED AREA FOUR TIMES DAILY 12/13/22   Hassie Lint, PA-C  fluticasone  (FLONASE ) 50 MCG/ACT nasal spray Place 2 sprays into both nostrils daily. 01/02/24   Hassie Lint, PA-C  hydrochlorothiazide  (HYDRODIURIL ) 25 MG tablet Take 1 tablet (25 mg total) by mouth daily. 01/02/24   Hassie Lint, PA-C  omeprazole  (PRILOSEC) 20 MG capsule TAKE 1 CAPSULE(20 MG) BY MOUTH DAILY Patient not taking: Reported on 01/02/2024 12/17/23   Newlin, Enobong, MD  vitamin E 180 MG (400 UNITS) capsule Take 800 Units by mouth daily.    [provider]      Allergies    Patient has no known allergies.    Review of Systems   Review of Systems  Gastrointestinal:  Positive for vomiting.    Physical Exam Updated Vital Signs BP (!) 143/78   Pulse 68   Temp 97.7 F (36.5 C) (Oral)   Resp 15   Ht 5' 11 (1.803 m)   Wt 95.3 kg   SpO2 100%   BMI 29.29 kg/m  Physical Exam Vitals and nursing note reviewed.  Constitutional:      General: He is not in acute distress.    Appearance: Normal appearance.  HENT:     Head: Normocephalic.     Nose: Nose normal.     Mouth/Throat:     Mouth: Mucous membranes are moist.     Pharynx: Oropharynx is clear.  Eyes:     Extraocular Movements: Extraocular movements intact.     Conjunctiva/sclera: Conjunctivae normal.  Cardiovascular:     Rate and Rhythm: Normal rate.     Heart sounds: Normal heart  sounds.  Pulmonary:     Effort: Pulmonary effort is normal.     Breath sounds: Wheezing (L > R diffuse expiratory wheeze) and rhonchi (L sided) present.  Abdominal:     General: Abdomen is flat.     Palpations: Abdomen is soft.     Tenderness: There is no abdominal tenderness.  Musculoskeletal:        General: Normal range of motion.     Cervical back: Normal range of motion.     Right lower leg: No edema.     Left lower leg: No edema.  Skin:    General: Skin is warm and dry.  Neurological:     General: No focal deficit present.     Mental Status: He is alert and oriented to person, place, and time.  Psychiatric:        Mood and Affect: Mood normal.        Behavior: Behavior normal.     ED Results / Procedures / Treatments   Labs (all labs ordered are listed, but only abnormal results are displayed) Labs Reviewed  BASIC METABOLIC PANEL WITH GFR - Abnormal; Notable for the following components:      Result Value   Potassium 3.0 (*)    Glucose, Bld 129 (*)    All other components within normal limits  TROPONIN I (HIGH SENSITIVITY) - Abnormal; Notable for the following components:   Troponin I (High Sensitivity) 23 (*)    All other components within normal limits  TROPONIN I (HIGH SENSITIVITY) - Abnormal; Notable for the following components:   Troponin I (High Sensitivity) 20 (*)    All other components within normal limits  CBC  LIPASE, BLOOD  HEPATIC FUNCTION PANEL  MAGNESIUM    EKG EKG Interpretation Date/Time:  Wednesday April 09 2024 08:34:13 EDT Ventricular Rate:  73 PR Interval:    QRS Duration:  93 QT Interval:  411 QTC Calculation: 453 R Axis:   53  Text Interpretation: Undetermined rhythm Anteroseptal infarct, old Repol abnrm suggests ischemia, diffuse leads Minimal ST elevation, lateral leads Interpretation limited secondary to artifact depressions worsened in V5-V6 compared to last EKG in 2016 Confirmed by Celesta Coke ([id number]) on 04/09/2024 8:41:12  AM  Radiology DG Chest 2 View Result Date: 04/09/2024 CLINICAL DATA:  Chest pain. EXAM: CHEST - 2 VIEW COMPARISON:  03/08/2021 FINDINGS: Cardiopericardial silhouette is at upper limits of normal for size. The lungs are clear without focal pneumonia, edema, pneumothorax or pleural effusion. No acute bony abnormality. IMPRESSION: No active cardiopulmonary disease. Electronically Signed   By: Zack Hero.D.  On: 04/09/2024 08:28    Procedures Procedures    Medications Ordered in ED Medications  ipratropium-albuterol  (DUONEB) 0.5-2.5 (3) MG/3ML nebulizer solution 3 mL (3 mLs Nebulization Given 04/09/24 0835)  predniSONE (DELTASONE) tablet 60 mg (60 mg Oral Given 04/09/24 0835)  potassium chloride  SA (KLOR-CON  M) CR tablet 40 mEq (40 mEq Oral Given 04/09/24 0835)  ipratropium-albuterol  (DUONEB) 0.5-2.5 (3) MG/3ML nebulizer solution 3 mL (3 mLs Nebulization Given 04/09/24 1003)    ED Course/ Medical Decision Making/ A&P Clinical Course as of 04/09/24 1106  Wed Apr 09, 2024  0827 Mild hypokalemia on labs will be repleted. [VK]  0830 Initial troponin mildly elevated, will need repeat. [VK]  0831 No acute disease on CXR. [VK]  0844 EKG appears to be in new atrial flutter. He is currently rate controlled. CHADS-VASC 2 for age and history of HTN and will be recommended anticoagulation. He is no longer on AC from prior DVT. [VK]  [id number] Upon reassessment, patient reports improvement of shortness of breath, trace end expiratory wheeze in bilateral lung bases. He will be given additional neb. Repeat trop pending. [VK]  1046 Repeat troponin flat. Patient is stable for discharge home and will be given A fib clinic follow up and recommended PCP follow up. Will be given Eliquis and steroid course.  [VK]    Clinical Course User Index [VK] Kingsley, Jahmere Bramel K, DO                                 Medical Decision Making This patient presents to the ED with chief complaint(s) of cough with pertinent past  medical history of HTN, GERD, allergies, chronic cough which further complicates the presenting complaint. The complaint involves an extensive differential diagnosis and also carries with it a high risk of complications and morbidity.    The differential diagnosis includes unlikely ACS due to chronicity of symptoms, considering pneumonia, pneumothorax, pulmonary edema, pleural effusion, viral syndrome also less likely due to chronicity of symptoms, reactive airway disease, bronchitis,  Intra-abdominal pathology less likely as vomiting seems to be posttussive  Additional history obtained: Additional history obtained from N/A Records reviewed Primary Care Documents  ED Course and Reassessment: On patient's arrival he is hemodynamically stable in no acute distress.  Was initially evaluated in triage and had labs including troponin and chest x-ray ordered.  EKG and LFTs will be added on.  The patient does have some wheezing and rhonchi on the left lung fields more compared to the right.  Will be given steroids and DuoNeb and will be closely reassessed.  Independent labs interpretation:  The following labs were independently interpreted: mildly elevated trop -> flat on repeat, mild hypokalemia  Independent visualization of imaging: - I independently visualized the following imaging with scope of interpretation limited to determining acute life threatening conditions related to emergency care: CXR, which revealed no acute disease  Consultation: - Consulted or discussed management/test interpretation w/ external professional: N?A  Consideration for admission or further workup: Patient has no emergent conditions requiring admission or further work-up at this time and is stable for discharge home with primary care and cardiology follow-up  Social Determinants of health: N/A    Amount and/or Complexity of Data Reviewed Labs: ordered. Radiology: ordered.  Risk Prescription drug  management.          Final Clinical Impression(s) / ED Diagnoses Final diagnoses:  New onset atrial flutter (HCC)  Chronic cough  Hypokalemia  Wheezing    Rx / DC Orders ED Discharge Orders          Ordered    apixaban (ELIQUIS) 5 MG TABS tablet  2 times daily        04/09/24 1104    Amb Referral to AFIB Clinic        04/09/24 1104    predniSONE (DELTASONE) 10 MG tablet  Daily        04/09/24 1104              Kingsley, Theadora Noyes K, DO 04/09/24 1106

## 2024-04-09 NOTE — ED Notes (Signed)
 Lab contacted and will add on Hepatic function to blood already in lab

## 2024-04-10 ENCOUNTER — Telehealth: Payer: Self-pay | Admitting: Family Medicine

## 2024-04-10 NOTE — Telephone Encounter (Addendum)
 FYI, no call on file.   Copied from CRM [id number]. Topic: General - Call Back - No Documentation >> Apr 10, 2024 11:22 AM Alpha Arts wrote: Reason for CRM: Patient missed call from clinic and is requesting a call back  Callback #: [phone]

## 2024-04-17 ENCOUNTER — Ambulatory Visit (HOSPITAL_COMMUNITY)
Admission: RE | Admit: 2024-04-17 | Discharge: 2024-04-17 | Disposition: A | Discharge status: Home / Self Care | Source: Ambulatory Visit | Attending: Internal Medicine | Admitting: Internal Medicine

## 2024-04-17 ENCOUNTER — Other Ambulatory Visit (HOSPITAL_COMMUNITY): Payer: Self-pay

## 2024-04-17 VITALS — BP 120/64 | HR 75 | Ht 71.0 in | Wt 217.2 lb

## 2024-04-17 DIAGNOSIS — I4819 Other persistent atrial fibrillation: Secondary | ICD-10-CM | POA: Diagnosis not present

## 2024-04-17 DIAGNOSIS — D6869 Other thrombophilia: Secondary | ICD-10-CM | POA: Diagnosis not present

## 2024-04-17 DIAGNOSIS — I517 Cardiomegaly: Secondary | ICD-10-CM | POA: Diagnosis not present

## 2024-04-17 DIAGNOSIS — I4891 Unspecified atrial fibrillation: Secondary | ICD-10-CM

## 2024-04-17 DIAGNOSIS — I4892 Unspecified atrial flutter: Secondary | ICD-10-CM

## 2024-04-17 MED ORDER — APIXABAN 5 MG PO TABS: 5.0000 mg | ORAL_TABLET | Freq: Two times a day (BID) | ORAL | 6 refills | Status: DC

## 2024-04-17 NOTE — Progress Notes (Signed)
 Primary Care Physician: Joaquin Mulberry, MD Primary Cardiologist: None Electrophysiologist: None     Referring Physician: ED     Dustin [name] is a 68 y.o. male with a history of HTN, GERD, cirrhosis, cough, and atrial fibrillation who presents for consultation in the [facility]. ED visit on 04/09/24 for emesis found to be in new Afib. Patient is on Eliquis  5 mg BID for a CHADS2VASC score of 2.  On evaluation today, he is currently in atrial flutter. He has continued on ASA in addition to Eliquis .    Today, he denies symptoms of palpitations, chest pain, shortness of breath, orthopnea, PND, lower extremity edema, dizziness, presyncope, syncope, snoring, daytime somnolence, bleeding, or neurologic sequela. The patient is tolerating medications without difficulties and is otherwise without complaint today.   he has a BMI of Body mass index is 30.29 kg/m.Aaron Aas Filed Weights   04/17/24 0920  Weight: 98.5 kg    Current Outpatient Medications  Medication Sig Dispense Refill   albuterol  (VENTOLIN  HFA) 108 (90 Base) MCG/ACT inhaler INHALE 2 PUFFS INTO THE LUNGS EVERY 6 HOURS AS NEEDED FOR WHEEZING OR SHORTNESS OF BREATH 17 g 1   amLODipine  (NORVASC ) 10 MG tablet Take 1 tablet (10 mg total) by mouth daily. 90 tablet 1   atorvastatin  (LIPITOR) 20 MG tablet TAKE 1 TABLET(20 MG) BY MOUTH DAILY 90 tablet 1   carvedilol  (COREG ) 6.25 MG tablet TAKE 1 TABLET(6.25 MG) BY MOUTH TWICE DAILY WITH A MEAL 180 tablet 1   cetirizine  (ZYRTEC ) 10 MG tablet Take 1 tablet (10 mg total) by mouth daily. 90 tablet 1   diclofenac  (VOLTAREN ) 75 MG EC tablet TAKE 1 TABLET(75 MG) BY MOUTH TWICE DAILY AS NEEDED FOR PAIN (Patient taking differently: Take 75 mg by mouth as needed.) 60 tablet 0   diclofenac  Sodium (VOLTAREN ) 1 % GEL APPLY 4 GRAMS EXTERNALLY TO THE AFFECTED AREA FOUR TIMES DAILY 100 g 2   fluticasone  (FLONASE ) 50 MCG/ACT nasal spray Place 2 sprays into both nostrils daily. 16 g  1   hydrochlorothiazide  (HYDRODIURIL ) 25 MG tablet Take 1 tablet (25 mg total) by mouth daily. 90 tablet 1   omeprazole  (PRILOSEC) 20 MG capsule TAKE 1 CAPSULE(20 MG) BY MOUTH DAILY 90 capsule 1   apixaban  (ELIQUIS ) 5 MG TABS tablet Take 1 tablet (5 mg total) by mouth 2 (two) times daily. 60 tablet 6   No current facility-administered medications for this encounter.    Atrial Fibrillation Management history:  Previous antiarrhythmic drugs: none Previous cardioversions: none Previous ablations: none Anticoagulation history: Eliquis    ROS- All systems are reviewed and negative except as per the HPI above.  Physical Exam: BP 120/64   Pulse 75   Ht 5' 11 (1.803 m)   Wt 98.5 kg   BMI 30.29 kg/m   GEN: Well nourished, well developed in no acute distress NECK: No JVD; No carotid bruits CARDIAC: Irregularly irregular rate and rhythm, 2/6 systolic ejection murmurs; no rubs or gallops RESPIRATORY:  Clear to auscultation without rales, wheezing or rhonchi  ABDOMEN: Soft, non-tender, non-distended EXTREMITIES:  No edema; No deformity   EKG today demonstrates  Vent. rate 75 BPM PR interval * ms QRS duration 92 ms QT/QTcB 436/486 ms P-R-T axes * 73 266 Atrial flutter with variable block Left ventricular hypertrophy with repolarization abnormality ( Sokolow-Lyon ) Cannot rule out Septal infarct , age undetermined Abnormal ECG When compared with ECG of 09-Apr-2024 08:34, PREVIOUS ECG IS PRESENT Confirmed by Virgil Griffiths,  Drexel Gentles [phone]) on 04/17/2024 10:24:17 AM  Echo 02/2015 showed normal LV EF.  ASSESSMENT & PLAN CHA2DS2-VASc Score = 2  The patient's score is based upon: CHF History: 0 HTN History: 1 Diabetes History: 0 Stroke History: 0 Vascular Disease History: 0 Age Score: 1 Gender Score: 0       ASSESSMENT AND PLAN: Persistent Atrial Flutter(ICD10:  I48.19) The patient's CHA2DS2-VASc score is 2, indicating a 2.2% annual risk of stroke.    He is currently in atrial  flutter. Education provided about arrhythmia and discussion on triggers. Discussion about cardioversion procedure to try to restore normal rhythm. Patient is very hesitant to proceed with cardioversion and does not want to do procedure. I discussed with patient the potential risks of procedure but also went over the benefit of feeling better in normal rhythm. I discussed with patient his echocardiogram results in 2016 and how it would be very beneficial to obtain a repeat echocardiogram and have him establish with cardiologist regarding severe LVH with outflow tract obstruction. Will order echocardiogram and refer to Cardiology.   Secondary Hypercoagulable State (ICD10:  D68.69) The patient is at significant risk for stroke/thromboembolism based upon his CHA2DS2-VASc Score of 2.  Continue Apixaban  (Eliquis ).  We discussed the risks vs benefits of anticoagulation. Continue Eliquis  5 mg BID without interruption. Stop ASA.    Will help establish with Cardiology. Follow up Afib clinic 6 months to revisit cardioversion.   [name], PA-C  Afib Clinic [facility] [address] [place], [place] [address] [phone]

## 2024-04-17 NOTE — Patient Instructions (Signed)
 Stop aspirin    Echocardiogram -- scheduling will call once insurance authorization received.

## 2024-05-05 ENCOUNTER — Ambulatory Visit: Attending: Family Medicine | Admitting: Family Medicine

## 2024-05-05 ENCOUNTER — Encounter: Payer: Self-pay | Admitting: Family Medicine

## 2024-05-05 VITALS — BP 121/72 | HR 77 | Ht 71.0 in | Wt 214.0 lb

## 2024-05-05 DIAGNOSIS — M19041 Primary osteoarthritis, right hand: Secondary | ICD-10-CM | POA: Diagnosis not present

## 2024-05-05 DIAGNOSIS — R011 Cardiac murmur, unspecified: Secondary | ICD-10-CM

## 2024-05-05 DIAGNOSIS — M19042 Primary osteoarthritis, left hand: Secondary | ICD-10-CM

## 2024-05-05 DIAGNOSIS — E785 Hyperlipidemia, unspecified: Secondary | ICD-10-CM | POA: Diagnosis not present

## 2024-05-05 DIAGNOSIS — I10 Essential (primary) hypertension: Secondary | ICD-10-CM

## 2024-05-05 DIAGNOSIS — R7303 Prediabetes: Secondary | ICD-10-CM | POA: Diagnosis not present

## 2024-05-05 DIAGNOSIS — E876 Hypokalemia: Secondary | ICD-10-CM | POA: Diagnosis not present

## 2024-05-05 DIAGNOSIS — I4891 Unspecified atrial fibrillation: Secondary | ICD-10-CM

## 2024-05-05 DIAGNOSIS — J069 Acute upper respiratory infection, unspecified: Secondary | ICD-10-CM | POA: Diagnosis not present

## 2024-05-05 DIAGNOSIS — K219 Gastro-esophageal reflux disease without esophagitis: Secondary | ICD-10-CM

## 2024-05-05 LAB — POCT GLYCOSYLATED HEMOGLOBIN (HGB A1C): HbA1c, POC (controlled diabetic range): 6 % (ref 0.0–7.0)

## 2024-05-05 NOTE — Patient Instructions (Signed)
 VISIT SUMMARY:  Today, you were seen for left hand pain due to arthritis, and we discussed your ongoing management for atrial fibrillation, respiratory symptoms, hypertension, hyperlipidemia, and gastroesophageal reflux disease (GERD). We also reviewed your general health maintenance and planned routine blood tests.  YOUR PLAN:  -ARTHRITIS: Arthritis is a condition that causes inflammation and pain in the joints. You are experiencing intermittent pain in your left hand, especially at the base of your thumb. We will prescribe prednisone  to help reduce the inflammation. You should continue using the diclofenac  gel instead of the oral form due to your use of Eliquis . If the pain persists, we may consider referring you to an orthopedic specialist for a corticosteroid injection.  -ATRIAL FIBRILLATION: Atrial fibrillation is an irregular and often rapid heart rate that can increase your risk of strokes and other heart-related complications. You are currently managing this condition with Eliquis , and you should continue taking it as prescribed. Aspirin  has been discontinued. Please follow up with your cardiologist and continue taking carvedilol  for blood pressure management.  -RESPIRATORY SYMPTOMS: You recently experienced respiratory distress and were treated with an albuterol  inhaler. You still have a slight cough with mucus but are improving. Continue using the albuterol  inhaler as needed and take cough medicine for symptomatic relief.  -HYPERTENSION: Hypertension is high blood pressure. Your blood pressure is well-controlled with your current medications. Continue taking amlodipine , carvedilol , and hydrochlorothiazide  as prescribed.  -HYPERLIPIDEMIA: Hyperlipidemia is having high levels of lipids (fats) in your blood, which can increase your risk of heart disease. This condition is managed with atorvastatin , and you should continue taking it as prescribed.  -GASTROESOPHAGEAL REFLUX DISEASE (GERD): GERD  is a digestive disorder that affects the ring of muscle between your esophagus and your stomach, causing acid reflux and heartburn. You are managing this condition with omeprazole , and you should continue taking it as prescribed.  -GENERAL HEALTH MAINTENANCE: We will conduct routine blood tests to monitor your potassium, cholesterol, and uric acid levels. This will help us  ensure that your conditions are well-managed and to rule out any additional issues such as gout.  INSTRUCTIONS:  Please schedule your next visit in six months. If you experience any new symptoms or have any concerns, make an appointment to see us  sooner.

## 2024-05-05 NOTE — Progress Notes (Signed)
 Subjective:  Patient ID: Dustin [name], male    DOB: 04-07-1956  Age: 68 y.o. MRN: [id number]  CC: Medical Management of Chronic Issues (Pain in left hand)     Discussed the use of AI scribe software for clinical note transcription with the patient, who gave verbal consent to proceed.  History of Present Illness Dustin [name] is a 68 year old male with  a history of hypertension, hypercholesterolemia, osteoarthritis of the hands, Prediabetes, new onset A-fib who presents with left hand pain.  He experiences sharp, intermittent pain in his left hand, primarily at the base of the Lthumb, with more pronounced symptoms on the left. He uses ice, hot water, and a topical gel for relief. He has not taken diclofenac  pills recently due to starting Eliquis  for atrial fibrillation.  At an ED visit last month he was diagnosed with new onset A-fib after he had presented with cough and wheezing where he was treated with an albuterol  inhaler and a nebulizer. He continues to have a slight cough and chest rattling but notes improvement. He denies smoking and uses cough medicine as needed.  Chest x-ray revealed no cardiopulmonary abnormalities. Currently taking Eliquis . No symptoms of irregular heart rhythm, dizziness, palpitation or breathing difficulties are present. He was advised to stop taking aspirin . He has been to the A-fib clinic for follow-up.  His past medical history includes hypertension, managed with amlodipine , carvedilol , and hydrochlorothiazide . He also takes atorvastatin  for cholesterol and omeprazole  for acid reflux. Recent blood tests showed low potassium of 3.0 in June, and his A1c has improved from 6.2 to 6.0.    Past Medical History:  Diagnosis Date   Arthritis    bil knees, left hand   Carcinoid tumor    DVT of upper extremity (deep vein thrombosis) (HCC)    left brachial vein, 12/2010   Helicobacter pylori gastritis    History of blood clots    to left arm   Hypertension     Shortness of breath dyspnea    with exertion   Tubular adenoma of colon 2017    Past Surgical History:  Procedure Laterality Date   colonscopy      1 year ago    KNEE ARTHROPLASTY Right 04/30/2015   Procedure: COMPUTER ASSISTED TOTAL KNEE ARTHROPLASTY;  Surgeon: Oneil JAYSON Herald, MD;  Location: [facility] OR;  Service: Orthopedics;  Laterality: Right;   Left  Hand   2011   cyst removed.   TOTAL KNEE ARTHROPLASTY  10/19/2011   Procedure: TOTAL KNEE ARTHROPLASTY;  Surgeon: [place] JULIANNA Pepper;  Location: [facility] OR;  Service: Orthopedics;  Laterality: Left;    Family History  Problem Relation Age of Onset   Cancer Mother    Cancer Father        prostate cancer   Diabetes Other    Hypertension Other    Diabetes Maternal Uncle    Anesthesia problems Neg Hx    Hypotension Neg Hx    Malignant hyperthermia Neg Hx    Pseudochol deficiency Neg Hx     Social History   Socioeconomic History   Marital status: Single    Spouse name: Not on file   Number of children: Not on file   Years of education: Not on file   Highest education level: Not on file  Occupational History   Not on file  Tobacco Use   Smoking status: Never   Smokeless tobacco: Never  Substance and Sexual Activity   Alcohol use: No  Alcohol/week: 0.0 standard drinks of alcohol   Drug use: No   Sexual activity: Yes  Other Topics Concern   Not on file  Social History Narrative   Not on file   Social Drivers of Health   Financial Resource Strain: Low Risk  (05/01/2024)   Overall Financial Resource Strain (CARDIA)    Difficulty of Paying Living Expenses: Not hard at all  Food Insecurity: No Food Insecurity (05/01/2024)   Hunger Vital Sign    Worried About Running Out of Food in the Last Year: Never true    Ran Out of Food in the Last Year: Never true  Transportation Needs: No Transportation Needs (05/01/2024)   PRAPARE - Administrator, Civil Service (Medical): No    Lack of Transportation (Non-Medical): No   Physical Activity: Sufficiently Active (05/01/2024)   Exercise Vital Sign    Days of Exercise per Week: 3 days    Minutes of Exercise per Session: 60 min  Stress: No Stress Concern Present (05/01/2024)   [employer] of Occupational Health - Occupational Stress Questionnaire    Feeling of Stress: Not at all  Social Connections: Moderately Isolated (05/01/2024)   Social Connection and Isolation Panel    Frequency of Communication with Friends and Family: Once a week    Frequency of Social Gatherings with Friends and Family: Twice a week    Attends Religious Services: More than 4 times per year    Active Member of [employer] or Organizations: No    Attends Engineer, structural: Not on file    Marital Status: Never married    No Known Allergies  Outpatient Medications Prior to Visit  Medication Sig Dispense Refill   albuterol  (VENTOLIN  HFA) 108 (90 Base) MCG/ACT inhaler INHALE 2 PUFFS INTO THE LUNGS EVERY 6 HOURS AS NEEDED FOR WHEEZING OR SHORTNESS OF BREATH 17 g 1   amLODipine  (NORVASC ) 10 MG tablet Take 1 tablet (10 mg total) by mouth daily. 90 tablet 1   apixaban  (ELIQUIS ) 5 MG TABS tablet Take 1 tablet (5 mg total) by mouth 2 (two) times daily. 60 tablet 6   atorvastatin  (LIPITOR) 20 MG tablet TAKE 1 TABLET(20 MG) BY MOUTH DAILY 90 tablet 1   carvedilol  (COREG ) 6.25 MG tablet TAKE 1 TABLET(6.25 MG) BY MOUTH TWICE DAILY WITH A MEAL 180 tablet 1   cetirizine  (ZYRTEC ) 10 MG tablet Take 1 tablet (10 mg total) by mouth daily. 90 tablet 1   diclofenac  Sodium (VOLTAREN ) 1 % GEL APPLY 4 GRAMS EXTERNALLY TO THE AFFECTED AREA FOUR TIMES DAILY 100 g 2   fluticasone  (FLONASE ) 50 MCG/ACT nasal spray Place 2 sprays into both nostrils daily. 16 g 1   hydrochlorothiazide  (HYDRODIURIL ) 25 MG tablet Take 1 tablet (25 mg total) by mouth daily. 90 tablet 1   omeprazole  (PRILOSEC) 20 MG capsule TAKE 1 CAPSULE(20 MG) BY MOUTH DAILY 90 capsule 1   diclofenac  (VOLTAREN ) 75 MG EC tablet TAKE 1 TABLET(75  MG) BY MOUTH TWICE DAILY AS NEEDED FOR PAIN (Patient taking differently: Take 75 mg by mouth as needed.) 60 tablet 0   No facility-administered medications prior to visit.     ROS Review of Systems  Constitutional:  Negative for activity change and appetite change.  HENT:  Negative for sinus pressure and sore throat.   Respiratory:  Positive for cough. Negative for chest tightness, shortness of breath and wheezing.   Cardiovascular:  Negative for chest pain and palpitations.  Gastrointestinal:  Negative for abdominal distention,  abdominal pain and constipation.  Genitourinary: Negative.   Musculoskeletal: Negative.        See HPI  Psychiatric/Behavioral:  Negative for behavioral problems and dysphoric mood.     Objective:  BP 121/72   Pulse 77   Ht 5' 11 (1.803 m)   Wt 214 lb (97.1 kg)   SpO2 96%   BMI 29.85 kg/m      05/05/2024    8:42 AM 04/17/2024    9:20 AM 04/09/2024   10:20 AM  BP/Weight  Systolic BP 121 120 143  Diastolic BP 72 64 78  Wt. (Lbs) 214 217.2   BMI 29.85 kg/m2 30.29 kg/m2       Physical Exam Constitutional:      Appearance: He is well-developed.  Cardiovascular:     Rate and Rhythm: Normal rate.     Heart sounds: Murmur heard.  Pulmonary:     Effort: Pulmonary effort is normal.     Breath sounds: Normal breath sounds. No wheezing or rales.  Chest:     Chest wall: No tenderness.  Abdominal:     General: Bowel sounds are normal. There is no distension.     Palpations: Abdomen is soft. There is no mass.     Tenderness: There is no abdominal tenderness.  Musculoskeletal:        General: Normal range of motion.     Right lower leg: No edema.     Left lower leg: No edema.     Comments: Able to make a fist bilaterally No tenderness on palpation of the joints of the hands and fingers  Neurological:     Mental Status: He is alert and oriented to person, place, and time.  Psychiatric:        Mood and Affect: Mood normal.        Latest Ref  Rng & Units 04/09/2024    7:24 AM 06/13/2023    9:29 AM 06/07/2022    9:15 AM  CMP  Glucose 70 - 99 mg/dL 870  895  894   BUN 8 - 23 mg/dL 9  15  18    Creatinine 0.61 - 1.24 mg/dL 9.12  8.89  9.03   Sodium 135 - 145 mmol/L 135  139  140   Potassium 3.5 - 5.1 mmol/L 3.0  3.9  4.0   Chloride 98 - 111 mmol/L 101  101  100   CO2 22 - 32 mmol/L 24  24  25    Calcium  8.9 - 10.3 mg/dL 9.2  89.9  89.7   Total Protein 6.5 - 8.1 g/dL 7.9  7.2  8.1   Total Bilirubin 0.0 - 1.2 mg/dL 1.0  1.3  1.4   Alkaline Phos 38 - 126 U/L 46  64  67   AST 15 - 41 U/L 28  30  25    ALT 0 - 44 U/L 20  34  22     Lipid Panel     Component Value Date/Time   CHOL 118 06/13/2023 0929   TRIG 58 06/13/2023 0929   HDL 45 06/13/2023 0929   CHOLHDL 2.3 07/08/2020 0904   CHOLHDL 3.1 04/01/2014 1226   VLDL 9 04/01/2014 1226   LDLCALC 60 06/13/2023 0929    CBC    Component Value Date/Time   WBC 4.8 04/09/2024 0724   RBC 4.90 04/09/2024 0724   HGB 14.7 04/09/2024 0724   HGB 15.0 03/01/2017 0914   HCT 43.8 04/09/2024 0724   HCT 42.3 03/01/2017 0914  PLT 246 04/09/2024 0724   PLT 229 03/01/2017 0914   MCV 89.4 04/09/2024 0724   MCV 85 03/01/2017 0914   MCH 30.0 04/09/2024 0724   MCHC 33.6 04/09/2024 0724   RDW 13.2 04/09/2024 0724   RDW 14.2 03/01/2017 0914   LYMPHSABS 1.2 10/03/2014 1126   MONOABS 0.6 10/03/2014 1126   EOSABS 0.0 10/03/2014 1126   BASOSABS 0.0 10/03/2014 1126    Lab Results  Component Value Date   HGBA1C 6.0 05/05/2024    Lab Results  Component Value Date   HGBA1C 6.0 05/05/2024   HGBA1C 6.2 01/02/2024   HGBA1C 6.0 06/13/2023       Assessment & Plan Osteoarthritis of hand Intermittent pain in left hand, consistent with arthritis. Diclofenac  use limited due to Eliquis . - Prescribe prednisone  for inflammation. - Advise use of diclofenac  gel instead of oral form. - Consider referral to orthopedics for corticosteroid injection if pain persists.  Atrial Fibrillation New  onset managed with Eliquis . Aspirin  discontinued. - Continue Eliquis  as prescribed. - Follow up with cardiologist. - Continue carvedilol  for rate control  URI Recent distress treated with albuterol . Slight cough with mucus. No pneumonia or pulmonary edema on x-ray. - Continue albuterol  inhaler as needed. - Use cough medicine as needed for symptomatic relief.  Hypertension Blood pressure well-controlled with current medications. - Continue amlodipine , carvedilol , and hydrochlorothiazide . -Counseled on blood pressure goal of less than 130/80, low-sodium, DASH diet, medication compliance, 150 minutes of moderate intensity exercise per week. Discussed medication compliance, adverse effects.   Hyperlipidemia Managed with atorvastatin . - Continue atorvastatin . -Low-cholesterol diet  Gastroesophageal Reflux Disease (GERD) Managed with omeprazole . - Continue omeprazole .   Hypokalemia - Last potassium was 3.0 Will check potassium level again today   Prediabetes Labs reveal prediabetes with an A1c of 6.0.  An A1c of 6.5 is supportive of a diagnosis of type 2 diabetes mellitus.  Working on a low carbohydrate diet, exercise, weight loss is recommended in order to prevent progression to type 2 diabetes mellitus.   Cardiac murmur Stable - Scheduled for echo in less than 2 weeks  General Health Maintenance Routine blood tests planned to monitor potassium, cholesterol, and uric acid levels. - Check uric acid levels to rule out gout. - Recheck potassium levels due to previous low reading. - Check cholesterol levels.  Follow-up Routine follow-up and monitoring of conditions. - Schedule next visit in six months. - Advise to make an appointment if new symptoms or concerns arise.    No orders of the defined types were placed in this encounter.   Follow-up: Return in about 6 months (around 11/05/2024) for Chronic medical conditions.       Corrina Sabin, MD, FAAFP. [facility] [place], [place] [phone]   05/05/2024, 9:48 AM

## 2024-05-06 ENCOUNTER — Ambulatory Visit: Payer: Self-pay | Admitting: Family Medicine

## 2024-05-06 LAB — LP+NON-HDL CHOLESTEROL
Cholesterol, Total: 105 mg/dL (ref 100–199)
HDL: 41 mg/dL (ref >39)
LDL Chol Calc (NIH): 51 mg/dL (ref 0–99)
Total Non-HDL-Chol (LDL+VLDL): 64 mg/dL (ref 0–129)
Triglycerides: 54 mg/dL (ref 0–149)
VLDL Cholesterol Cal: 13 mg/dL (ref 5–40)

## 2024-05-06 LAB — POTASSIUM: Potassium: 3.7 mmol/L (ref 3.5–5.2)

## 2024-05-06 LAB — URIC ACID: Uric Acid: 5.2 mg/dL (ref 3.8–8.4)

## 2024-05-16 ENCOUNTER — Ambulatory Visit (HOSPITAL_COMMUNITY): Payer: Self-pay | Admitting: Internal Medicine

## 2024-05-16 ENCOUNTER — Ambulatory Visit (HOSPITAL_COMMUNITY)
Admission: RE | Admit: 2024-05-16 | Discharge: 2024-05-16 | Disposition: A | Discharge status: Home / Self Care | Source: Ambulatory Visit | Attending: Vascular Surgery | Admitting: Vascular Surgery

## 2024-05-16 DIAGNOSIS — I4891 Unspecified atrial fibrillation: Secondary | ICD-10-CM

## 2024-05-16 LAB — ECHOCARDIOGRAM COMPLETE
AR max vel: 2.52 cm2
AV Area VTI: 2.7 cm2
AV Area mean vel: 2.52 cm2
AV Mean grad: 12.5 mmHg
AV Peak grad: 24.3 mmHg
Ao pk vel: 2.47 m/s
Area-P 1/2: 3.85 cm2
Est EF: >75
MV M vel: 2.82 m/s
MV Peak grad: 31.8 mmHg
S' Lateral: 2.59 cm

## 2024-05-28 ENCOUNTER — Other Ambulatory Visit (HOSPITAL_COMMUNITY)

## 2024-06-08 ENCOUNTER — Encounter (HOSPITAL_COMMUNITY): Payer: Self-pay

## 2024-06-08 ENCOUNTER — Other Ambulatory Visit: Payer: Self-pay

## 2024-06-08 ENCOUNTER — Emergency Department (HOSPITAL_COMMUNITY)
Admission: EM | Admit: 2024-06-08 | Discharge: 2024-06-08 | Disposition: A | Discharge status: Home / Self Care | Attending: Emergency Medicine | Admitting: Emergency Medicine

## 2024-06-08 DIAGNOSIS — Z7901 Long term (current) use of anticoagulants: Secondary | ICD-10-CM | POA: Diagnosis not present

## 2024-06-08 DIAGNOSIS — H9201 Otalgia, right ear: Secondary | ICD-10-CM | POA: Diagnosis not present

## 2024-06-08 DIAGNOSIS — R001 Bradycardia, unspecified: Secondary | ICD-10-CM | POA: Diagnosis not present

## 2024-06-08 DIAGNOSIS — Z79899 Other long term (current) drug therapy: Secondary | ICD-10-CM | POA: Insufficient documentation

## 2024-06-08 DIAGNOSIS — H9203 Otalgia, bilateral: Secondary | ICD-10-CM | POA: Diagnosis present

## 2024-06-08 DIAGNOSIS — I10 Essential (primary) hypertension: Secondary | ICD-10-CM | POA: Diagnosis not present

## 2024-06-08 DIAGNOSIS — H6123 Impacted cerumen, bilateral: Secondary | ICD-10-CM | POA: Diagnosis not present

## 2024-06-08 LAB — RESP PANEL BY RT-PCR (RSV, FLU A&B, COVID)  RVPGX2
Influenza A by PCR: NEGATIVE
Influenza B by PCR: NEGATIVE
Resp Syncytial Virus by PCR: NEGATIVE
SARS Coronavirus 2 by RT PCR: NEGATIVE

## 2024-06-08 MED ORDER — CARBAMIDE PEROXIDE 6.5 % OT SOLN: 5.0000 [drp] | Freq: Two times a day (BID) | OTIC | 0 refills | Status: DC

## 2024-06-08 MED ORDER — AMOXICILLIN 500 MG PO CAPS: 500.0000 mg | ORAL_CAPSULE | Freq: Three times a day (TID) | ORAL | 0 refills | Status: DC

## 2024-06-08 MED ORDER — ACETAMINOPHEN 500 MG PO TABS: 500.0000 mg | ORAL_TABLET | Freq: Four times a day (QID) | ORAL | 0 refills | Status: AC | PRN

## 2024-06-08 MED ORDER — ACETAMINOPHEN 500 MG PO TABS
1000.0000 mg | ORAL_TABLET | Freq: Once | ORAL | Status: AC
Start: 2024-06-08 — End: 2024-06-08
  Administered 2024-06-08: 1000 mg via ORAL
  Filled 2024-06-08: qty 2

## 2024-06-08 NOTE — ED Triage Notes (Signed)
 Pt c/o R ear pain and difficulty hearing x several days.  Pain score 8/10 w/ sharp pains. Pt reports he thinks he ruptured his ear drum w/ a Q Tip.  Pt reports taking Tylenol  w/o relief.

## 2024-06-08 NOTE — ED Provider Notes (Signed)
 [place] EMERGENCY DEPARTMENT AT [facility] Provider Note   CSN: [id number] Arrival date & time: 06/08/24  9185     Patient presents with: Dustin [name] is a 68 y.o. male.   The history is provided by the patient and medical records. No language interpreter was used.  Otalgia    68 year old male history of hypertension, high blood clot currently on Eliquis  presenting with complaint of ear pain.  Patient states several days ago he was cleaning his ear with a Q-tip and the next day he noticed pain to his right ear.  Pain is described as a tooth ache sensation that now spreads towards the right side of his face, quite intense with muffling of sound in his ear.  Pain is moderate in severity, he took Tylenol  with minimal improvement.  He does not endorse any fever or chills no neck pain no chest pain.  He denies ringing in his ear but does endorse muffled ear sounds.  He worries he may have perforated his eardrum.  Prior to Admission medications   Medication Sig Start Date End Date Taking? Authorizing Provider  albuterol  (VENTOLIN  HFA) 108 (90 Base) MCG/ACT inhaler INHALE 2 PUFFS INTO THE LUNGS EVERY 6 HOURS AS NEEDED FOR WHEEZING OR SHORTNESS OF BREATH 04/03/24   Newlin, Enobong, MD  amLODipine  (NORVASC ) 10 MG tablet Take 1 tablet (10 mg total) by mouth daily. 01/02/24   Danton Jon HERO, PA-C  apixaban  (ELIQUIS ) 5 MG TABS tablet Take 1 tablet (5 mg total) by mouth 2 (two) times daily. 04/17/24   Terra Fairy PARAS, PA-C  atorvastatin  (LIPITOR) 20 MG tablet TAKE 1 TABLET(20 MG) BY MOUTH DAILY 03/26/24   Newlin, Enobong, MD  carvedilol  (COREG ) 6.25 MG tablet TAKE 1 TABLET(6.25 MG) BY MOUTH TWICE DAILY WITH A MEAL 01/02/24   McClung, Jon M, PA-C  cetirizine  (ZYRTEC ) 10 MG tablet Take 1 tablet (10 mg total) by mouth daily. 01/02/24   Danton Jon HERO, PA-C  diclofenac  Sodium (VOLTAREN ) 1 % GEL APPLY 4 GRAMS EXTERNALLY TO THE AFFECTED AREA FOUR TIMES DAILY 12/13/22   Danton Jon HERO, PA-C  fluticasone  (FLONASE ) 50 MCG/ACT nasal spray Place 2 sprays into both nostrils daily. 01/02/24   Danton Jon HERO, PA-C  hydrochlorothiazide  (HYDRODIURIL ) 25 MG tablet Take 1 tablet (25 mg total) by mouth daily. 01/02/24   Danton Jon HERO, PA-C  omeprazole  (PRILOSEC) 20 MG capsule TAKE 1 CAPSULE(20 MG) BY MOUTH DAILY 12/17/23   Newlin, Enobong, MD    Allergies: Patient has no known allergies.    Review of Systems  HENT:  Positive for ear pain.     Updated Vital Signs BP (!) 149/101   Pulse 69   Temp 97.6 F (36.4 C) (Oral)   Resp 18   Ht 5' 11 (1.803 m)   Wt 97.1 kg   SpO2 99%   BMI 29.85 kg/m   Physical Exam Constitutional:      General: He is not in acute distress.    Appearance: He is well-developed.  HENT:     Head: Atraumatic.     Ears:     Comments: Cerumen impaction noted to bilateral ear canal, unable to visualize TM.  Diminished hearing to right ear compared to left. Eyes:     Conjunctiva/sclera: Conjunctivae normal.  Neck:     Vascular: No carotid bruit.  Musculoskeletal:     Cervical back: Normal range of motion and neck supple. No rigidity or tenderness.  Lymphadenopathy:  Cervical: No cervical adenopathy.  Skin:    Findings: No rash.  Neurological:     Mental Status: He is alert.     (all labs ordered are listed, but only abnormal results are displayed) Labs Reviewed  RESP PANEL BY RT-PCR (RSV, FLU A&B, COVID)  [id number]    EKG: None ED ECG REPORT   Date: 06/08/2024  Rate: 58  Rhythm: sinus bradycardia  QRS Axis: normal  Intervals: PR prolonged  ST/T Wave abnormalities: LVH with secondary repol abnormality  Conduction Disutrbances:none  Narrative Interpretation:   Old EKG Reviewed: unchanged  I have personally reviewed the EKG tracing and agree with the computerized printout as noted.   Radiology: No results found.   Procedures   Medications Ordered in the ED  acetaminophen  (TYLENOL ) tablet 1,000 mg (1,000 mg Oral  Given 06/08/24 1014)                                    Medical Decision Making Amount and/or Complexity of Data Reviewed ECG/medicine tests: ordered.  Risk OTC drugs. Prescription drug management.   BP 133/81   Pulse 76   Temp 97.6 F (36.4 C) (Oral)   Resp 18   Ht 5' 11 (1.803 m)   Wt 97.1 kg   SpO2 100%   BMI 29.85 kg/m   8:32 AM  68 year old male history of hypertension, high blood clot currently on Eliquis  presenting with complaint of ear pain.  Patient states several days ago he was cleaning his ear with a Q-tip and the next day he noticed pain to his right ear.  Pain is described as a tooth ache sensation that now spreads towards the right side of his face, quite intense with muffling of sound in his ear.  Pain is moderate in severity, he took Tylenol  with minimal improvement.  He does not endorse any fever or chills no neck pain no chest pain.  He denies ringing in his ear but does endorse muffled ear sounds.  He worries he may have perforated his eardrum.  On exam patient has cerumen impaction involving bilateral ear canals.  He has diminished hearing to his right ear.  Ear canal was irrigated and moderate amount of cerumen was evacuated but I am still unable to fully visualize the TM.  Given his ear pain and discomfort, will treat with Tylenol  as well as amoxicillin  for potential otitis media.  He does not have any significant tenderness to manipulation of his earlobe therefore have low suspicion for otitis externa.  His symptoms could also be due to viral etiology such as COVID, flu.  I gave patient return precaution.  I have low suspicion for this being related to cardiac source as patient is without any chest pain or shortness of breath. Pt report improvement of hearing after cerumen irrigation.  Recommend avoidance of Q-tip.  Recommend using debrox to help break up the ear wax.    Fortunately respiratory panel is negative.  On reassessment, patient states his symptoms  improved with Tylenol .  His EKG is unchanged from prior.  At this time I felt patient is stable to be discharged home with supportive care and antibiotic as well as debrox as previously recommended.     Final diagnoses:  Right ear pain  Bilateral impacted cerumen    ED Discharge Orders          Ordered    acetaminophen  (TYLENOL ) 500 MG tablet  Every 6 hours PRN        06/08/24 1003    amoxicillin  (AMOXIL ) 500 MG capsule  3 times daily        06/08/24 1003    carbamide peroxide (DEBROX) 6.5 % OTIC solution  2 times daily        06/08/24 1221               Nivia Colon, PA-C 06/08/24 1223    Pamella Ozell LABOR, DO 06/16/24 [phone]

## 2024-06-08 NOTE — ED Notes (Signed)
 This writer irrigated and performed wax removal in bilateral ears.  Brown cerumen removed from R ear.  Nothing was able to be removed from L ear.

## 2024-06-08 NOTE — ED Notes (Signed)
 PT verbalized discharged instructions opportunity for questions provided.

## 2024-06-08 NOTE — Discharge Instructions (Signed)
 You have been evaluated for your symptoms.  Your ear pain may be due to an ear infection.  Please take antibiotic as prescribed.  Your COVID flu and RSV test came back negative.  Please avoid using Q-tips to clean your ear, instead drop a few drops of Debrox into both ears right before you shower and let it sit for about 5 to 10 minutes before washing it out.  Do this on a regular basis to help dissolve your earwax.  Follow-up closely with your doctor for further care.  Return if you have any concern.

## 2024-06-14 ENCOUNTER — Other Ambulatory Visit: Payer: Self-pay | Admitting: Family Medicine

## 2024-06-14 DIAGNOSIS — K219 Gastro-esophageal reflux disease without esophagitis: Secondary | ICD-10-CM

## 2024-06-14 DIAGNOSIS — J328 Other chronic sinusitis: Secondary | ICD-10-CM

## 2024-07-05 ENCOUNTER — Other Ambulatory Visit: Payer: Self-pay | Admitting: Family Medicine

## 2024-07-05 DIAGNOSIS — R053 Chronic cough: Secondary | ICD-10-CM

## 2024-07-09 ENCOUNTER — Telehealth: Payer: Self-pay

## 2024-07-09 ENCOUNTER — Other Ambulatory Visit: Payer: Self-pay | Admitting: Family Medicine

## 2024-07-09 DIAGNOSIS — I10 Essential (primary) hypertension: Secondary | ICD-10-CM

## 2024-07-09 MED ORDER — CARVEDILOL 6.25 MG PO TABS: ORAL_TABLET | ORAL | 1 refills | Status: AC

## 2024-07-09 NOTE — Telephone Encounter (Signed)
 Copied from CRM #[id number]. Topic: General - Other >> Jul 09, 2024 11:40 AM Dustin [name] wrote: Reason for CRM: Pt called to report that he requested his carvedilol  refill days ago. Please advise

## 2024-07-10 NOTE — Telephone Encounter (Signed)
 Requested Prescriptions  Refused Prescriptions Disp Refills   carvedilol  (COREG ) 6.25 MG tablet [Pharmacy Med Name: CARVEDILOL  6.25MG  TABLETS] 180 tablet 1    Sig: TAKE 1 TABLET(6.25 MG) BY MOUTH TWICE DAILY WITH A MEAL     Cardiovascular: Beta Blockers 3 Failed - 07/10/2024  1:27 PM      Failed - Last BP in normal range    BP Readings from Last 1 Encounters:  06/08/24 (!) 148/86         Passed - Cr in normal range and within 360 days    Creat  Date Value Ref Range Status  03/15/2017 0.98 0.70 - 1.25 mg/dL Final    Comment:      For patients > or = 68 years of age: The upper reference limit for Creatinine is approximately 13% higher for people identified as African-American.      Creatinine, Ser  Date Value Ref Range Status  04/09/2024 0.87 0.61 - 1.24 mg/dL Final         Passed - AST in normal range and within 360 days    AST  Date Value Ref Range Status  04/09/2024 28 15 - 41 U/L Final         Passed - ALT in normal range and within 360 days    ALT  Date Value Ref Range Status  04/09/2024 20 0 - 44 U/L Final         Passed - Last Heart Rate in normal range    Pulse Readings from Last 1 Encounters:  06/08/24 68         Passed - Valid encounter within last 6 months    Recent Outpatient Visits           2 months ago Prediabetes   [facility] - A Dept Of [place]. [facility] Delbert Clam, MD   6 months ago Prediabetes   [facility] - A Dept Of [place]. [facility] [place], Jon HERO, [place]   1 year ago Prediabetes   [facility] - A Dept Of [place]. [facility] Delbert Clam, MD   1 year ago Medication management   [facility] - A Dept Of [place]. [facility] Tonia Garnette LITTIE, RPH-CPP   1 year ago Prediabetes   [facility] - [facility] Dept Of . [facility] [place], [place], [place]       Future  Appointments             In 3 months Delbert Clam, MD [facility] - A Dept Of [place]. [facility], [place]

## 2024-08-01 ENCOUNTER — Other Ambulatory Visit: Payer: Self-pay

## 2024-08-01 DIAGNOSIS — I10 Essential (primary) hypertension: Secondary | ICD-10-CM

## 2024-08-01 MED ORDER — HYDROCHLOROTHIAZIDE 25 MG PO TABS: 25.0000 mg | ORAL_TABLET | Freq: Every day | ORAL | 1 refills | Status: DC

## 2024-08-04 ENCOUNTER — Ambulatory Visit: Payer: Self-pay | Admitting: *Deleted

## 2024-08-04 ENCOUNTER — Ambulatory Visit: Payer: Self-pay

## 2024-08-04 NOTE — Telephone Encounter (Signed)
 Copied from CRM [id number]. Topic: Clinical - Red Word Triage >> Aug 04, 2024  8:48 AM Rea ORN wrote: Red Word that prompted transfer to Nurse Triage: Tingling in left foot that goes up the leg.  Agent went to transfer the call and connection was lost. Will place in call backs.

## 2024-08-04 NOTE — Telephone Encounter (Signed)
 FYI Only or Action Required?: FYI only for provider.  Patient was last seen in primary care on 05/05/2024 by Newlin, Enobong, MD.  Called Nurse Triage reporting Numbness (Numbness, tingling in foot/).  Symptoms began several days ago.  Interventions attempted: Nothing.  Symptoms are: unchanged.  Triage Disposition: See HCP Within 4 Hours (Or PCP Triage)  Patient/caregiver understands and will follow disposition?: Yes   Reason for Disposition  [1] Weakness of the face, arm / hand, or leg / foot on one side of the body AND [2] gradual onset (e.g., days to weeks) AND [3] present now  Answer Assessment - Initial Assessment Questions 1. SYMPTOM: What is the main symptom you are concerned about? (e.g., weakness, numbness)     Numbness and tingling in feet- left 2. ONSET: When did this start? (e.g., minutes, hours, days; while sleeping)     Started 2 days ago 3. LAST NORMAL: When was the last time you (the patient) were normal (no symptoms)?     Couple days 4. PATTERN Does this come and go, or has it been constant since it started?  Is it present now?     constant 5. CARDIAC SYMPTOMS: Have you had any of the following symptoms: chest pain, difficulty breathing, palpitations?     no 6. NEUROLOGIC SYMPTOMS: Have you had any of the following symptoms: headache, dizziness, vision loss, double vision, changes in speech, unsteady on your feet?     no 7. OTHER SYMPTOMS: Do you have any other symptoms?     no  Protocols used: [facility]  Copied from CRM #[id number]. Topic: Clinical - Red Word Triage >> Aug 04, 2024  9:15 AM Leonette SQUIBB wrote: Red Word that prompted transfer to Nurse Triage: patient is having numbness and tingling in his left foot , getting wore.  Wants to be seen today but there are no appt.

## 2024-08-04 NOTE — Telephone Encounter (Signed)
 This RN attempted x1,no answer, left vm.

## 2024-08-05 ENCOUNTER — Ambulatory Visit: Admitting: Family Medicine

## 2024-08-05 ENCOUNTER — Encounter: Payer: Self-pay | Admitting: Family Medicine

## 2024-08-05 VITALS — BP 138/84 | HR 74 | Temp 98.4°F | Resp 16 | Wt 209.8 lb

## 2024-08-05 DIAGNOSIS — R200 Anesthesia of skin: Secondary | ICD-10-CM | POA: Diagnosis not present

## 2024-08-05 NOTE — Progress Notes (Unsigned)
 Established Patient Office Visit  Subjective    Patient ID: Dustin [name], male    DOB: December 11, 1955  Age: 68 y.o. MRN: [id number]  CC:  Chief Complaint  Patient presents with   Medical Management of Chronic Issues    Patient c/o tingling in his legs that has been present or 3 days. Patient denies any pain but says legs are somewhat numb    HPI [name] presents with complaint of intermittent numbness in his feet that migrates up his legs. He denies known trauma or injury.   Outpatient Encounter Medications as of 08/05/2024  Medication Sig   acetaminophen  (TYLENOL ) 500 MG tablet Take 1 tablet (500 mg total) by mouth every 6 (six) hours as needed.   albuterol  (VENTOLIN  HFA) 108 (90 Base) MCG/ACT inhaler INHALE 2 PUFFS INTO THE LUNGS EVERY 6 HOURS AS NEEDED FOR WHEEZING OR SHORTNESS OF BREATH   amLODipine  (NORVASC ) 10 MG tablet Take 1 tablet (10 mg total) by mouth daily.   amoxicillin  (AMOXIL ) 500 MG capsule Take 1 capsule (500 mg total) by mouth 3 (three) times daily.   apixaban  (ELIQUIS ) 5 MG TABS tablet Take 1 tablet (5 mg total) by mouth 2 (two) times daily.   atorvastatin  (LIPITOR) 20 MG tablet TAKE 1 TABLET(20 MG) BY MOUTH DAILY   carbamide peroxide (DEBROX) 6.5 % OTIC solution Place 5 drops into both ears 2 (two) times daily.   carvedilol  (COREG ) 6.25 MG tablet TAKE 1 TABLET(6.25 MG) BY MOUTH TWICE DAILY WITH A MEAL   cetirizine  (ZYRTEC ) 10 MG tablet Take 1 tablet (10 mg total) by mouth daily.   diclofenac  Sodium (VOLTAREN ) 1 % GEL APPLY 4 GRAMS EXTERNALLY TO THE AFFECTED AREA FOUR TIMES DAILY   fluticasone  (FLONASE ) 50 MCG/ACT nasal spray PLACE 1 SPRAY IN BOTH NOSTRILS DAILY   hydrochlorothiazide  (HYDRODIURIL ) 25 MG tablet Take 1 tablet (25 mg total) by mouth daily.   omeprazole  (PRILOSEC) 20 MG capsule TAKE 1 CAPSULE(20 MG) BY MOUTH DAILY   No facility-administered encounter medications on file as of 08/05/2024.    Past Medical History:  Diagnosis Date   Arthritis     bil knees, left hand   Carcinoid tumor (HCC)    DVT of upper extremity (deep vein thrombosis) (HCC)    left brachial vein, 12/2010   Helicobacter pylori gastritis    History of blood clots    to left arm   Hypertension    Shortness of breath dyspnea    with exertion   Tubular adenoma of colon 2017    Past Surgical History:  Procedure Laterality Date   colonscopy      1 year ago    KNEE ARTHROPLASTY Right 04/30/2015   Procedure: COMPUTER ASSISTED TOTAL KNEE ARTHROPLASTY;  Surgeon: Oneil JAYSON Herald, MD;  Location: [facility] OR;  Service: Orthopedics;  Laterality: Right;   Left  Hand   2011   cyst removed.   TOTAL KNEE ARTHROPLASTY  10/19/2011   Procedure: TOTAL KNEE ARTHROPLASTY;  Surgeon: [place] JULIANNA Pepper;  Location: [facility] OR;  Service: Orthopedics;  Laterality: Left;    Family History  Problem Relation Age of Onset   Cancer Mother    Cancer Father        prostate cancer   Diabetes Other    Hypertension Other    Diabetes Maternal Uncle    Anesthesia problems Neg Hx    Hypotension Neg Hx    Malignant hyperthermia Neg Hx    Pseudochol deficiency Neg Hx  Social History   Socioeconomic History   Marital status: Single    Spouse name: Not on file   Number of children: Not on file   Years of education: Not on file   Highest education level: Not on file  Occupational History   Not on file  Tobacco Use   Smoking status: Never   Smokeless tobacco: Never  Substance and Sexual Activity   Alcohol use: No    Alcohol/week: 0.0 standard drinks of alcohol   Drug use: No   Sexual activity: Yes  Other Topics Concern   Not on file  Social History Narrative   Not on file   Social Drivers of Health   Financial Resource Strain: Low Risk  (05/01/2024)   Overall Financial Resource Strain (CARDIA)    Difficulty of Paying Living Expenses: Not hard at all  Food Insecurity: No Food Insecurity (05/01/2024)   Hunger Vital Sign    Worried About Running Out of Food in the Last Year: Never true     Ran Out of Food in the Last Year: Never true  Transportation Needs: No Transportation Needs (05/01/2024)   PRAPARE - Administrator, Civil Service (Medical): No    Lack of Transportation (Non-Medical): No  Physical Activity: Sufficiently Active (05/01/2024)   Exercise Vital Sign    Days of Exercise per Week: 3 days    Minutes of Exercise per Session: 60 min  Stress: No Stress Concern Present (05/01/2024)   [employer] of Occupational Health - Occupational Stress Questionnaire    Feeling of Stress: Not at all  Social Connections: Moderately Isolated (05/01/2024)   Social Connection and Isolation Panel    Frequency of Communication with Friends and Family: Once a week    Frequency of Social Gatherings with Friends and Family: Twice a week    Attends Religious Services: More than 4 times per year    Active Member of [employer] or Organizations: No    Attends Banker Meetings: Not on file    Marital Status: Never married  Intimate Partner Violence: Not At Risk (03/25/2024)   Humiliation, Afraid, Rape, and Kick questionnaire    Fear of Current or Ex-Partner: No    Emotionally Abused: No    Physically Abused: No    Sexually Abused: No    Review of Systems  All other systems reviewed and are negative.       Objective    BP 138/84   Pulse 74   Temp 98.4 F (36.9 C) (Oral)   Resp 16   Wt 209 lb 12.8 oz (95.2 kg)   SpO2 97%   BMI 29.26 kg/m   Physical Exam Vitals and nursing note reviewed.  Constitutional:      General: He is not in acute distress. Cardiovascular:     Rate and Rhythm: Normal rate and regular rhythm.  Pulmonary:     Effort: Pulmonary effort is normal.     Breath sounds: Normal breath sounds.  Abdominal:     Palpations: Abdomen is soft.     Tenderness: There is no abdominal tenderness.  Musculoskeletal:     Right foot: Normal range of motion. No deformity or tenderness.     Left foot: Normal range of motion. No deformity or  tenderness.  Neurological:     General: No focal deficit present.     Mental Status: He is alert and oriented to person, place, and time.         Assessment & Plan:  Numbness in feet -     Ambulatory referral to Podiatry     Return if symptoms worsen or fail to improve.   Tanda Raguel SQUIBB, MD

## 2024-08-08 ENCOUNTER — Inpatient Hospital Stay (HOSPITAL_COMMUNITY)
Admission: EM | Admit: 2024-08-08 | Discharge: 2024-08-24 | DRG: 827 | Disposition: A | Discharge status: Skilled Nursing Facility | Attending: Internal Medicine | Admitting: Internal Medicine

## 2024-08-08 ENCOUNTER — Emergency Department (HOSPITAL_COMMUNITY)

## 2024-08-08 ENCOUNTER — Ambulatory Visit: Admitting: Podiatry

## 2024-08-08 ENCOUNTER — Encounter: Payer: Self-pay | Admitting: Family Medicine

## 2024-08-08 ENCOUNTER — Other Ambulatory Visit: Payer: Self-pay

## 2024-08-08 ENCOUNTER — Encounter (HOSPITAL_COMMUNITY): Payer: Self-pay | Admitting: *Deleted

## 2024-08-08 DIAGNOSIS — Q859 Phakomatosis, unspecified: Secondary | ICD-10-CM | POA: Diagnosis not present

## 2024-08-08 DIAGNOSIS — R338 Other retention of urine: Secondary | ICD-10-CM | POA: Diagnosis present

## 2024-08-08 DIAGNOSIS — R32 Unspecified urinary incontinence: Secondary | ICD-10-CM | POA: Diagnosis present

## 2024-08-08 DIAGNOSIS — K5730 Diverticulosis of large intestine without perforation or abscess without bleeding: Secondary | ICD-10-CM | POA: Diagnosis not present

## 2024-08-08 DIAGNOSIS — K626 Ulcer of anus and rectum: Secondary | ICD-10-CM | POA: Diagnosis present

## 2024-08-08 DIAGNOSIS — M546 Pain in thoracic spine: Secondary | ICD-10-CM | POA: Diagnosis present

## 2024-08-08 DIAGNOSIS — D1809 Hemangioma of other sites: Secondary | ICD-10-CM | POA: Diagnosis not present

## 2024-08-08 DIAGNOSIS — N3289 Other specified disorders of bladder: Secondary | ICD-10-CM | POA: Diagnosis not present

## 2024-08-08 DIAGNOSIS — K5641 Fecal impaction: Secondary | ICD-10-CM | POA: Diagnosis not present

## 2024-08-08 DIAGNOSIS — R569 Unspecified convulsions: Secondary | ICD-10-CM | POA: Diagnosis not present

## 2024-08-08 DIAGNOSIS — K921 Melena: Secondary | ICD-10-CM | POA: Diagnosis not present

## 2024-08-08 DIAGNOSIS — M5126 Other intervertebral disc displacement, lumbar region: Secondary | ICD-10-CM | POA: Diagnosis not present

## 2024-08-08 DIAGNOSIS — K920 Hematemesis: Secondary | ICD-10-CM | POA: Diagnosis not present

## 2024-08-08 DIAGNOSIS — D123 Benign neoplasm of transverse colon: Secondary | ICD-10-CM | POA: Diagnosis not present

## 2024-08-08 DIAGNOSIS — R202 Paresthesia of skin: Secondary | ICD-10-CM | POA: Diagnosis not present

## 2024-08-08 DIAGNOSIS — M47816 Spondylosis without myelopathy or radiculopathy, lumbar region: Secondary | ICD-10-CM | POA: Diagnosis not present

## 2024-08-08 DIAGNOSIS — G9589 Other specified diseases of spinal cord: Principal | ICD-10-CM | POA: Diagnosis present

## 2024-08-08 DIAGNOSIS — R262 Difficulty in walking, not elsewhere classified: Secondary | ICD-10-CM | POA: Diagnosis present

## 2024-08-08 DIAGNOSIS — N401 Enlarged prostate with lower urinary tract symptoms: Secondary | ICD-10-CM | POA: Diagnosis present

## 2024-08-08 DIAGNOSIS — M489 Spondylopathy, unspecified: Secondary | ICD-10-CM | POA: Diagnosis not present

## 2024-08-08 DIAGNOSIS — D7389 Other diseases of spleen: Secondary | ICD-10-CM | POA: Diagnosis not present

## 2024-08-08 DIAGNOSIS — D497 Neoplasm of unspecified behavior of endocrine glands and other parts of nervous system: Secondary | ICD-10-CM | POA: Diagnosis present

## 2024-08-08 DIAGNOSIS — Z860101 Personal history of adenomatous and serrated colon polyps: Secondary | ICD-10-CM

## 2024-08-08 DIAGNOSIS — Z86718 Personal history of other venous thrombosis and embolism: Secondary | ICD-10-CM | POA: Diagnosis not present

## 2024-08-08 DIAGNOSIS — C799 Secondary malignant neoplasm of unspecified site: Secondary | ICD-10-CM | POA: Diagnosis not present

## 2024-08-08 DIAGNOSIS — E785 Hyperlipidemia, unspecified: Secondary | ICD-10-CM | POA: Diagnosis present

## 2024-08-08 DIAGNOSIS — K3189 Other diseases of stomach and duodenum: Secondary | ICD-10-CM | POA: Diagnosis present

## 2024-08-08 DIAGNOSIS — I6782 Cerebral ischemia: Secondary | ICD-10-CM | POA: Diagnosis not present

## 2024-08-08 DIAGNOSIS — Z7901 Long term (current) use of anticoagulants: Secondary | ICD-10-CM

## 2024-08-08 DIAGNOSIS — D3A8 Other benign neuroendocrine tumors: Secondary | ICD-10-CM | POA: Diagnosis present

## 2024-08-08 DIAGNOSIS — K6289 Other specified diseases of anus and rectum: Secondary | ICD-10-CM | POA: Diagnosis not present

## 2024-08-08 DIAGNOSIS — I4819 Other persistent atrial fibrillation: Secondary | ICD-10-CM | POA: Diagnosis present

## 2024-08-08 DIAGNOSIS — D122 Benign neoplasm of ascending colon: Secondary | ICD-10-CM | POA: Diagnosis present

## 2024-08-08 DIAGNOSIS — I10 Essential (primary) hypertension: Secondary | ICD-10-CM | POA: Diagnosis not present

## 2024-08-08 DIAGNOSIS — E876 Hypokalemia: Secondary | ICD-10-CM | POA: Diagnosis present

## 2024-08-08 DIAGNOSIS — R34 Anuria and oliguria: Secondary | ICD-10-CM | POA: Diagnosis not present

## 2024-08-08 DIAGNOSIS — M48061 Spinal stenosis, lumbar region without neurogenic claudication: Secondary | ICD-10-CM | POA: Diagnosis not present

## 2024-08-08 DIAGNOSIS — R1900 Intra-abdominal and pelvic swelling, mass and lump, unspecified site: Secondary | ICD-10-CM | POA: Diagnosis not present

## 2024-08-08 DIAGNOSIS — D496 Neoplasm of unspecified behavior of brain: Secondary | ICD-10-CM | POA: Diagnosis not present

## 2024-08-08 DIAGNOSIS — M5020 Other cervical disc displacement, unspecified cervical region: Secondary | ICD-10-CM | POA: Diagnosis not present

## 2024-08-08 DIAGNOSIS — M4802 Spinal stenosis, cervical region: Secondary | ICD-10-CM | POA: Diagnosis not present

## 2024-08-08 DIAGNOSIS — I4891 Unspecified atrial fibrillation: Secondary | ICD-10-CM | POA: Diagnosis not present

## 2024-08-08 DIAGNOSIS — D434 Neoplasm of uncertain behavior of spinal cord: Secondary | ICD-10-CM | POA: Diagnosis not present

## 2024-08-08 DIAGNOSIS — R918 Other nonspecific abnormal finding of lung field: Secondary | ICD-10-CM | POA: Diagnosis not present

## 2024-08-08 DIAGNOSIS — Z96651 Presence of right artificial knee joint: Secondary | ICD-10-CM | POA: Diagnosis present

## 2024-08-08 DIAGNOSIS — K317 Polyp of stomach and duodenum: Secondary | ICD-10-CM | POA: Diagnosis not present

## 2024-08-08 DIAGNOSIS — K5909 Other constipation: Secondary | ICD-10-CM | POA: Diagnosis present

## 2024-08-08 DIAGNOSIS — K7460 Unspecified cirrhosis of liver: Secondary | ICD-10-CM | POA: Diagnosis present

## 2024-08-08 DIAGNOSIS — Z8249 Family history of ischemic heart disease and other diseases of the circulatory system: Secondary | ICD-10-CM

## 2024-08-08 DIAGNOSIS — Z833 Family history of diabetes mellitus: Secondary | ICD-10-CM

## 2024-08-08 DIAGNOSIS — G9519 Other vascular myelopathies: Secondary | ICD-10-CM | POA: Diagnosis not present

## 2024-08-08 DIAGNOSIS — B182 Chronic viral hepatitis C: Secondary | ICD-10-CM | POA: Diagnosis present

## 2024-08-08 DIAGNOSIS — D120 Benign neoplasm of cecum: Secondary | ICD-10-CM | POA: Diagnosis present

## 2024-08-08 DIAGNOSIS — M5127 Other intervertebral disc displacement, lumbosacral region: Secondary | ICD-10-CM | POA: Diagnosis not present

## 2024-08-08 DIAGNOSIS — Q068 Other specified congenital malformations of spinal cord: Secondary | ICD-10-CM | POA: Diagnosis not present

## 2024-08-08 DIAGNOSIS — G319 Degenerative disease of nervous system, unspecified: Secondary | ICD-10-CM | POA: Diagnosis not present

## 2024-08-08 DIAGNOSIS — K635 Polyp of colon: Secondary | ICD-10-CM | POA: Diagnosis not present

## 2024-08-08 DIAGNOSIS — G959 Disease of spinal cord, unspecified: Secondary | ICD-10-CM | POA: Diagnosis not present

## 2024-08-08 DIAGNOSIS — E871 Hypo-osmolality and hyponatremia: Secondary | ICD-10-CM | POA: Diagnosis present

## 2024-08-08 DIAGNOSIS — Z79899 Other long term (current) drug therapy: Secondary | ICD-10-CM

## 2024-08-08 HISTORY — DX: Unspecified atrial fibrillation: I48.91

## 2024-08-08 LAB — COMPREHENSIVE METABOLIC PANEL WITH GFR
ALT: 22 U/L (ref 0–44)
AST: 25 U/L (ref 15–41)
Albumin: 4.1 g/dL (ref 3.5–5.0)
Alkaline Phosphatase: 45 U/L (ref 38–126)
Anion gap: 12 (ref 5–15)
BUN: 13 mg/dL (ref 8–23)
CO2: 26 mmol/L (ref 22–32)
Calcium: 9.4 mg/dL (ref 8.9–10.3)
Chloride: 100 mmol/L (ref 98–111)
Creatinine, Ser: 0.97 mg/dL (ref 0.61–1.24)
GFR, Estimated: >60 mL/min (ref >60)
Glucose, Bld: 110 mg/dL — ABNORMAL HIGH (ref 70–99)
Potassium: 2.8 mmol/L — ABNORMAL LOW (ref 3.5–5.1)
Sodium: 138 mmol/L (ref 135–145)
Total Bilirubin: 1.9 mg/dL — ABNORMAL HIGH (ref 0.0–1.2)
Total Protein: 7.7 g/dL (ref 6.5–8.1)

## 2024-08-08 LAB — I-STAT CHEM 8, ED
BUN: 16 mg/dL (ref 8–23)
Calcium, Ion: 1.04 mmol/L — ABNORMAL LOW (ref 1.15–1.40)
Chloride: 102 mmol/L (ref 98–111)
Creatinine, Ser: 1 mg/dL (ref 0.61–1.24)
Glucose, Bld: 109 mg/dL — ABNORMAL HIGH (ref 70–99)
HCT: 41 % (ref 39.0–52.0)
Hemoglobin: 13.9 g/dL (ref 13.0–17.0)
Potassium: 2.8 mmol/L — ABNORMAL LOW (ref 3.5–5.1)
Sodium: 140 mmol/L (ref 135–145)
TCO2: 24 mmol/L (ref 22–32)

## 2024-08-08 LAB — CBC
HCT: 39.6 % (ref 39.0–52.0)
Hemoglobin: 13.3 g/dL (ref 13.0–17.0)
MCH: 30 pg (ref 26.0–34.0)
MCHC: 33.6 g/dL (ref 30.0–36.0)
MCV: 89.4 fL (ref 80.0–100.0)
Platelets: 228 K/uL (ref 150–400)
RBC: 4.43 MIL/uL (ref 4.22–5.81)
RDW: 13.3 % (ref 11.5–15.5)
WBC: 4 K/uL (ref 4.0–10.5)
nRBC: 0 % (ref 0.0–0.2)

## 2024-08-08 LAB — CBG MONITORING, ED: Glucose-Capillary: 104 mg/dL — ABNORMAL HIGH (ref 70–99)

## 2024-08-08 LAB — MAGNESIUM: Magnesium: 2.1 mg/dL (ref 1.7–2.4)

## 2024-08-08 MED ORDER — HYDROMORPHONE HCL 1 MG/ML IJ SOLN
0.5000 mg | Freq: Once | INTRAMUSCULAR | Status: AC
  Administered 2024-08-08: 0.5 mg via INTRAVENOUS
  Filled 2024-08-08: qty 0.5

## 2024-08-08 MED ORDER — AMLODIPINE BESYLATE 5 MG PO TABS
10.0000 mg | ORAL_TABLET | Freq: Every day | ORAL | Status: DC
Start: 2024-08-09 — End: 2024-08-24
  Administered 2024-08-09 – 2024-08-24 (×16): 10 mg via ORAL
  Filled 2024-08-08 (×7): qty 1
  Filled 2024-08-08: qty 2
  Filled 2024-08-08 (×2): qty 1
  Filled 2024-08-08: qty 2
  Filled 2024-08-08: qty 1
  Filled 2024-08-08: qty 2
  Filled 2024-08-08 (×2): qty 1
  Filled 2024-08-08: qty 2

## 2024-08-08 MED ORDER — POLYETHYLENE GLYCOL 3350 17 G PO PACK
17.0000 g | PACK | Freq: Every day | ORAL | Status: DC | PRN
Start: 2024-08-08 — End: 2024-08-21
  Administered 2024-08-09 – 2024-08-20 (×8): 17 g via ORAL
  Filled 2024-08-08 (×8): qty 1

## 2024-08-08 MED ORDER — PANTOPRAZOLE SODIUM 40 MG PO TBEC
40.0000 mg | DELAYED_RELEASE_TABLET | Freq: Every day | ORAL | Status: DC
  Administered 2024-08-09 – 2024-08-20 (×12): 40 mg via ORAL
  Filled 2024-08-08 (×12): qty 1

## 2024-08-08 MED ORDER — POTASSIUM CHLORIDE 10 MEQ/100ML IV SOLN
10.0000 meq | Freq: Once | INTRAVENOUS | Status: DC
  Filled 2024-08-08: qty 100

## 2024-08-08 MED ORDER — SODIUM CHLORIDE 0.9% FLUSH
3.0000 mL | Freq: Two times a day (BID) | INTRAVENOUS | Status: DC
  Administered 2024-08-08 – 2024-08-24 (×33): 3 mL via INTRAVENOUS

## 2024-08-08 MED ORDER — METHOCARBAMOL 750 MG PO TABS
750.0000 mg | ORAL_TABLET | Freq: Four times a day (QID) | ORAL | Status: DC | PRN
Start: 2024-08-08 — End: 2024-08-24
  Administered 2024-08-08 – 2024-08-24 (×30): 750 mg via ORAL
  Filled 2024-08-08 (×31): qty 1

## 2024-08-08 MED ORDER — CARVEDILOL 6.25 MG PO TABS
6.2500 mg | ORAL_TABLET | Freq: Two times a day (BID) | ORAL | Status: DC
  Administered 2024-08-08 – 2024-08-24 (×31): 6.25 mg via ORAL
  Filled 2024-08-08 (×31): qty 1

## 2024-08-08 MED ORDER — POTASSIUM CHLORIDE 10 MEQ/100ML IV SOLN
10.0000 meq | INTRAVENOUS | Status: AC
  Administered 2024-08-08: 10 meq via INTRAVENOUS
  Filled 2024-08-08: qty 100

## 2024-08-08 MED ORDER — ALBUTEROL SULFATE (2.5 MG/3ML) 0.083% IN NEBU: 2.5000 mg | INHALATION_SOLUTION | Freq: Four times a day (QID) | RESPIRATORY_TRACT | Status: DC | PRN

## 2024-08-08 MED ORDER — GADOBUTROL 1 MMOL/ML IV SOLN
9.0000 mL | Freq: Once | INTRAVENOUS | Status: AC | PRN
Start: 2024-08-08 — End: 2024-08-08
  Administered 2024-08-08: 9 mL via INTRAVENOUS

## 2024-08-08 MED ORDER — POTASSIUM CHLORIDE 10 MEQ/100ML IV SOLN
INTRAVENOUS | Status: AC
  Administered 2024-08-08: 10 meq
  Filled 2024-08-08: qty 100

## 2024-08-08 MED ORDER — ALBUTEROL SULFATE HFA 108 (90 BASE) MCG/ACT IN AERS: 2.0000 | INHALATION_SPRAY | Freq: Four times a day (QID) | RESPIRATORY_TRACT | Status: DC | PRN

## 2024-08-08 MED ORDER — HYDROCODONE-ACETAMINOPHEN 5-325 MG PO TABS
1.0000 | ORAL_TABLET | Freq: Four times a day (QID) | ORAL | Status: DC | PRN
Start: 2024-08-08 — End: 2024-08-13
  Administered 2024-08-08 – 2024-08-13 (×16): 1 via ORAL
  Filled 2024-08-08 (×17): qty 1

## 2024-08-08 MED ORDER — POTASSIUM CHLORIDE ER 10 MEQ PO TBCR
40.0000 meq | EXTENDED_RELEASE_TABLET | Freq: Once | ORAL | Status: AC
  Administered 2024-08-08: 40 meq via ORAL
  Filled 2024-08-08: qty 4

## 2024-08-08 MED ORDER — ACETAMINOPHEN 650 MG RE SUPP: 650.0000 mg | Freq: Four times a day (QID) | RECTAL | Status: DC | PRN

## 2024-08-08 MED ORDER — ENOXAPARIN SODIUM 40 MG/0.4ML IJ SOSY
40.0000 mg | PREFILLED_SYRINGE | INTRAMUSCULAR | Status: DC
  Administered 2024-08-08 – 2024-08-13 (×6): 40 mg via SUBCUTANEOUS
  Filled 2024-08-08 (×6): qty 0.4

## 2024-08-08 MED ORDER — ACETAMINOPHEN 325 MG PO TABS
650.0000 mg | ORAL_TABLET | Freq: Four times a day (QID) | ORAL | Status: DC | PRN
  Administered 2024-08-09 – 2024-08-15 (×4): 650 mg via ORAL
  Filled 2024-08-08 (×5): qty 2

## 2024-08-08 MED ORDER — METHYLPREDNISOLONE SODIUM SUCC 125 MG IJ SOLR
125.0000 mg | Freq: Once | INTRAMUSCULAR | Status: DC
  Filled 2024-08-08: qty 2

## 2024-08-08 MED ORDER — DEXAMETHASONE 4 MG PO TABS
4.0000 mg | ORAL_TABLET | Freq: Two times a day (BID) | ORAL | Status: DC
  Administered 2024-08-08 – 2024-08-09 (×3): 4 mg via ORAL
  Filled 2024-08-08 (×3): qty 1

## 2024-08-08 MED ORDER — ATORVASTATIN CALCIUM 10 MG PO TABS
20.0000 mg | ORAL_TABLET | Freq: Every day | ORAL | Status: DC
  Administered 2024-08-09 – 2024-08-24 (×16): 20 mg via ORAL
  Filled 2024-08-08 (×16): qty 2

## 2024-08-08 MED ORDER — IOHEXOL 350 MG/ML SOLN
75.0000 mL | Freq: Once | INTRAVENOUS | Status: AC | PRN
Start: 2024-08-08 — End: 2024-08-08
  Administered 2024-08-08: 75 mL via INTRAVENOUS

## 2024-08-08 MED ORDER — HYDROCHLOROTHIAZIDE 25 MG PO TABS
25.0000 mg | ORAL_TABLET | Freq: Every day | ORAL | Status: DC
  Administered 2024-08-09 – 2024-08-22 (×14): 25 mg via ORAL
  Filled 2024-08-08 (×14): qty 1

## 2024-08-08 NOTE — Consult Note (Signed)
 Neurosurgery Consult Note  Assessment:  68 y/o M, hx DVT on Eliquis , cirrhosis who presents with progressive thoracic myelopathy over 1 week and imaging demonstrating cervicothoracic intramedullary edema and T2 intramedullary lesion  Plan:  CTCAP w/ contrast MRI Cspine w/wo contrast Dex 4mg  BID Diet and activity as tolerated Please hold Eliquis . Ok for DVT chemoppx Neurosurgery to follow as consultant   CC: can't walk  HPI:     Patient is a 68 y.o. male w/ hx w/ hx cirrhosis, HCV, DVT on Eliquis  who presents with 1 week of progressively worsening gait instability, constipation, retention. Also with worsening numbness in his left leg from the toes to the inguinal ligament. Also with numbness of his right foot. He denies upper extremity symptoms, cancer hx, neck pain, smoking hx. He has carcinoid tumor on his problem list, but he has never undergone any chemotherapy or radiation  MRI brain and Lspine unremarkable. MRI Tspine shows intramedullary edema extending from lower cervical spine cord (off the image) to mid thoracic. There appears to be an intramedullary mass at the level of T2 pedicle. CT-AP shows no lesoin   Patient Active Problem List   Diagnosis Date Noted   Hyperlipidemia LDL goal <100 03/02/2017   Carcinoid tumor of gastrointestinal tract (HCC) 09/14/2016   Hepatic cirrhosis (HCC) 04/27/2016   Hep C w/o coma, chronic (HCC) 11/22/2015   Pain due to total right knee replacement (HCC) 05/15/2015   History of DVT (deep vein thrombosis) 05/09/2015   Status post total right knee replacement 04/30/2015   Left ventricular hypertrophy by electrocardiogram 04/07/2015   Essential hypertension, benign 04/01/2014   Osteoarthritis 04/01/2014   Past Medical History:  Diagnosis Date   Arthritis    bil knees, left hand   Carcinoid tumor (HCC)    DVT of upper extremity (deep vein thrombosis) (HCC)    left brachial vein, 12/2010   Helicobacter pylori gastritis    History of blood  clots    to left arm   Hypertension    Shortness of breath dyspnea    with exertion   Tubular adenoma of colon 2017    Past Surgical History:  Procedure Laterality Date   colonscopy      1 year ago    KNEE ARTHROPLASTY Right 04/30/2015   Procedure: COMPUTER ASSISTED TOTAL KNEE ARTHROPLASTY;  Surgeon: Oneil JAYSON Herald, MD;  Location: [facility] OR;  Service: Orthopedics;  Laterality: Right;   Left  Hand   2011   cyst removed.   TOTAL KNEE ARTHROPLASTY  10/19/2011   Procedure: TOTAL KNEE ARTHROPLASTY;  Surgeon: [place] JULIANNA Pepper;  Location: [facility];  Service: Orthopedics;  Laterality: Left;    (Not in a hospital admission)  No Known Allergies  Social History   Tobacco Use   Smoking status: Never   Smokeless tobacco: Never  Substance Use Topics   Alcohol use: No    Alcohol/week: 0.0 standard drinks of alcohol    Family History  Problem Relation Age of Onset   Cancer Mother    Cancer Father        prostate cancer   Diabetes Other    Hypertension Other    Diabetes Maternal Uncle    Anesthesia problems Neg Hx    Hypotension Neg Hx    Malignant hyperthermia Neg Hx    Pseudochol deficiency Neg Hx      Review of Systems Pertinent items are noted in HPI.  Objective:   Patient Vitals for the past 8 hrs:  BP Temp  Temp src Pulse Resp SpO2 Height Weight  08/08/24 1305 (!) 151/77 98.5 F (36.9 C) Oral 75 17 100 % -- --  08/08/24 0649 -- -- -- -- -- -- 5' 11 (1.803 m) 95.3 kg  08/08/24 0648 (!) 141/76 98 F (36.7 C) Oral 91 18 100 % -- --   No intake/output data recorded. No intake/output data recorded.    Exam: Awake, alert BUE full strength and sensation LLE: subjective numbness from the inguinal ligament distally. Full strength RLE: subjective numbness of the right foot. Full strength    Data ReviewCBC:  Lab Results  Component Value Date   WBC 4.0 08/08/2024   RBC 4.43 08/08/2024   BMP:  Lab Results  Component Value Date   GLUCOSE 109 (H) 08/08/2024   CO2 26  08/08/2024   BUN 16 08/08/2024   BUN 15 06/13/2023   CREATININE 1.00 08/08/2024   CREATININE 0.98 03/15/2017   CALCIUM  9.4 08/08/2024

## 2024-08-08 NOTE — Progress Notes (Signed)
 Patient reported inability to urinate. Bladder scan performed twice with >999 and as results. In and out catheter performed at bedside. retrieved. Patient reported feeling relief in abdomen and back. Primary physician notified. Will continue to monitor.

## 2024-08-08 NOTE — ED Notes (Signed)
 Phlebotomy asked to stick

## 2024-08-08 NOTE — ED Notes (Signed)
 Pt OTF to ct. RB will finish venipuncture on return.

## 2024-08-08 NOTE — Progress Notes (Addendum)
 Pts potassium fluids disposed of and IV pole/equipment taken by unknown party after being taken off floor for scan.  First potassium bag unfinished, Young DO aware.

## 2024-08-08 NOTE — H&P (Signed)
 History and Physical   Dustin [name] DOB: [birth year] DOA: 08/08/2024  PCP: Delbert Clam, [name]   Patient coming from: Home  Chief Complaint: Left leg tingling, balance issues  HPI: Dustin [name] is a 68 y.o. male with medical history significant of hypertension, hyperlipidemia, cirrhosis, hepatitis C, DVT, GI carcinoid tumor presenting with left leg tingling and balance issues.  Patient reports 1 week of progressive symptoms.  Initially had left lower extremity tingling in his foot which has progressed up his leg and now reaches to the level of his buttock.  Has developed mid back pain and difficulty with ambulation secondary to balance issues.  Also reporting some intermittent difficulty with urination and defecation.  No known triggers.  Only recent events is a dental extraction.  Denies fevers, chills, chest pain, shortness of breath, abdominal pain, diarrhea, nausea, vomiting  ED Course: Vital signs in the ED notable for blood pressure in the 140 systolic.  Lab workup included CMP with potassium 2.8, glucose 110.  CBC within normal limits.  Magnesium normal 2.1.  CT of the abdomen pelvis showed mild bladder distention with mild prostate enlargement and moderate stool burden.  MRI brain showed no acute intracranial abnormality but did show partially visualized C-spine with spine edema extending down from C2.  Recommendation of reviewing further imaging.  MRI of the T-spine showed anterior medullary enhancement at T1-T2 with surrounding edema extending to C6/7.  Concern for primary cord neoplasm, metastatic disease thought less likely.  MRI of the L-spine showed no acute abnormality but did show degenerative disease.  Neurosurgery consulted and recommended steroids and further workup with CT of the chest and MRI of the C-spine.  They will see the patient.  Review of Systems: As per HPI otherwise all other systems reviewed and are negative.  Past Medical History:   Diagnosis Date   Arthritis    bil knees, left hand   Carcinoid tumor (HCC)    DVT of upper extremity (deep vein thrombosis) (HCC)    left brachial vein, 12/2010   Helicobacter pylori gastritis    History of blood clots    to left arm   Hypertension    Shortness of breath dyspnea    with exertion   Tubular adenoma of colon 2017    Past Surgical History:  Procedure Laterality Date   colonscopy      1 year ago    KNEE ARTHROPLASTY Right 04/30/2015   Procedure: COMPUTER ASSISTED TOTAL KNEE ARTHROPLASTY;  Surgeon: Oneil JAYSON Herald, [name];  Location: [facility] OR;  Service: Orthopedics;  Laterality: Right;   Left  Hand   2011   cyst removed.   TOTAL KNEE ARTHROPLASTY  10/19/2011   Procedure: TOTAL KNEE ARTHROPLASTY;  Surgeon: [place] JULIANNA Pepper;  Location: [facility] OR;  Service: Orthopedics;  Laterality: Left;    Social History  reports that he has never smoked. He has never used smokeless tobacco. He reports that he does not drink alcohol and does not use drugs.  No Known Allergies  Family History  Problem Relation Age of Onset   Cancer Mother    Cancer Father        prostate cancer   Diabetes Other    Hypertension Other    Diabetes Maternal Uncle    Anesthesia problems Neg Hx    Hypotension Neg Hx    Malignant hyperthermia Neg Hx    Pseudochol deficiency Neg Hx   Reviewed on admission  Prior to Admission medications   Medication Sig  Start Date End Date Taking? Authorizing Provider  acetaminophen  (TYLENOL ) 500 MG tablet Take 1 tablet (500 mg total) by mouth every 6 (six) hours as needed. 06/08/24   Nivia Colon, PA-C  albuterol  (VENTOLIN  HFA) 108 (90 Base) MCG/ACT inhaler INHALE 2 PUFFS INTO THE LUNGS EVERY 6 HOURS AS NEEDED FOR WHEEZING OR SHORTNESS OF BREATH 07/06/24   Newlin, Enobong, [name]  amLODipine  (NORVASC ) 10 MG tablet Take 1 tablet (10 mg total) by mouth daily. 01/02/24   Danton Jon HERO, PA-C  amoxicillin  (AMOXIL ) 500 MG capsule Take 1 capsule (500 mg total) by mouth 3 (three) times daily.  06/08/24   Nivia Colon, PA-C  apixaban  (ELIQUIS ) 5 MG TABS tablet Take 1 tablet (5 mg total) by mouth 2 (two) times daily. 04/17/24   Terra Fairy PARAS, PA-C  atorvastatin  (LIPITOR) 20 MG tablet TAKE 1 TABLET(20 MG) BY MOUTH DAILY 03/26/24   Newlin, Enobong, [name]  carbamide peroxide (DEBROX) 6.5 % OTIC solution Place 5 drops into both ears 2 (two) times daily. 06/08/24   Tran, Bowie, PA-C  carvedilol  (COREG ) 6.25 MG tablet TAKE 1 TABLET(6.25 MG) BY MOUTH TWICE DAILY WITH A MEAL 07/09/24   Newlin, Enobong, [name]  cetirizine  (ZYRTEC ) 10 MG tablet Take 1 tablet (10 mg total) by mouth daily. 01/02/24   Danton Jon HERO, PA-C  diclofenac  Sodium (VOLTAREN ) 1 % GEL APPLY 4 GRAMS EXTERNALLY TO THE AFFECTED AREA FOUR TIMES DAILY 12/13/22   Danton Jon HERO, PA-C  fluticasone  (FLONASE ) 50 MCG/ACT nasal spray PLACE 1 SPRAY IN BOTH NOSTRILS DAILY 06/16/24   Newlin, Enobong, [name]  hydrochlorothiazide  (HYDRODIURIL ) 25 MG tablet Take 1 tablet (25 mg total) by mouth daily. 08/01/24   Newlin, Enobong, [name]  omeprazole  (PRILOSEC) 20 MG capsule TAKE 1 CAPSULE(20 MG) BY MOUTH DAILY 06/16/24   Delbert Clam, [name]   Physical Exam: Vitals:   08/08/24 0648 08/08/24 0649 08/08/24 1305  BP: (!) 141/76  (!) 151/77  Pulse: 91  75  Resp: 18  17  Temp: 98 F (36.7 C)  98.5 F (36.9 C)  TempSrc: Oral  Oral  SpO2: 100%  100%  Weight:  95.3 kg   Height:  5' 11 (1.803 m)    Physical Exam Constitutional:      General: He is not in acute distress.    Appearance: Normal appearance.  HENT:     Head: Normocephalic and atraumatic.     Mouth/Throat:     Mouth: Mucous membranes are moist.     Pharynx: Oropharynx is clear.  Eyes:     Extraocular Movements: Extraocular movements intact.     Pupils: Pupils are equal, round, and reactive to light.  Cardiovascular:     Rate and Rhythm: Normal rate and regular rhythm.     Pulses: Normal pulses.     Heart sounds: Normal heart sounds.  Pulmonary:     Effort: Pulmonary effort is normal. No  respiratory distress.     Breath sounds: Normal breath sounds.  Abdominal:     General: Bowel sounds are normal. There is no distension.     Palpations: Abdomen is soft.     Tenderness: There is no abdominal tenderness.  Musculoskeletal:        General: No swelling or deformity.  Skin:    General: Skin is warm and dry.  Neurological:     General: No focal deficit present.     Mental Status: Mental status is at baseline.     Sensory: Sensory deficit (Parathesias at full left  lower extremity and right foot) present.    Labs on Admission: I have personally reviewed following labs and imaging studies  CBC: Recent Labs  Lab 08/08/24 0842 08/08/24 0848  WBC 4.0  --   HGB 13.3 13.9  HCT 39.6 41.0  MCV 89.4  --   PLT 228  --     Basic Metabolic Panel: Recent Labs  Lab 08/08/24 0842 08/08/24 0848 08/08/24 1009  NA 138 140  --   K 2.8* 2.8*  --   CL 100 102  --   CO2 26  --   --   GLUCOSE 110* 109*  --   BUN 13 16  --   CREATININE 0.97 1.00  --   CALCIUM  9.4  --   --   MG  --   --  2.1    GFR: Estimated Creatinine Clearance: 83.3 mL/min (by C-G formula based on SCr of 1 mg/dL).  Liver Function Tests: Recent Labs  Lab 08/08/24 0842  AST 25  ALT 22  ALKPHOS 45  BILITOT 1.9*  PROT 7.7  ALBUMIN 4.1    Urine analysis:    Component Value Date/Time   COLORURINE YELLOW 04/23/2015 1023   APPEARANCEUR CLEAR 04/23/2015 1023   LABSPEC 1.020 04/23/2015 1023   PHURINE 6.0 04/23/2015 1023   GLUCOSEU NEGATIVE 04/23/2015 1023   HGBUR NEGATIVE 04/23/2015 1023   BILIRUBINUR NEGATIVE 04/23/2015 1023   KETONESUR NEGATIVE 04/23/2015 1023   PROTEINUR NEGATIVE 04/23/2015 1023   UROBILINOGEN 0.2 04/23/2015 1023   NITRITE NEGATIVE 04/23/2015 1023   LEUKOCYTESUR NEGATIVE 04/23/2015 1023    Radiological Exams on Admission: MR THORACIC SPINE W WO CONTRAST Result Date: 08/08/2024 EXAM: MRI THORACIC SPINE WITH AND WITHOUT INTRAVENOUS CONTRAST 08/08/2024 12:40:09 PM TECHNIQUE:  Multiplanar multisequence MRI of the thoracic spine was performed with and without the administration of intravenous contrast. 9 mL (gadobutrol (GADAVIST) 1 MMOL/ML injection 9 mL GADOBUTROL 1 MMOL/ML IV SOLN) was administered. COMPARISON: None available. CLINICAL HISTORY: Mid-back pain, neuro deficit. FINDINGS: BONES AND ALIGNMENT: Normal alignment. Normal vertebral body heights. An 11 mm hemangioma is present superiorly at T12. Bone marrow signal is otherwise unremarkable. No other abnormal enhancement. SPINAL CORD: Mildly irregular intramedullary enhancement is present in the spinal cord from T1-2 through the mid body of T2 measuring 5 x 13 mm. Extensive cord edema extends superiorly to the highest image level, C6-7 and inferiorly to the level of T7. No other focal enhancement or cord enlargement is present. SOFT TISSUES: Unremarkable. DEGENERATIVE CHANGES: No significant focal disc protrusion or stenosis is present. No neural foraminal narrowing. IMPRESSION: 1. Mildly irregular intramedullary enhancement from T12 to mid T2 (5 x 13 mm) with extensive surrounding cord edema extending superiorly to C67 and inferiorly to T7. his could represent an intrinsic cord metastasis, primary spinal cord neoplasm is favored. This may represent an ependymoma or spinal cord astrocytoma. 2. No significant focal disc protrusion or stenosis. Electronically signed by: Lonni Necessary [name] 08/08/2024 01:17 PM EDT RP Workstation: [id number]   MR Brain W and Wo Contrast Result Date: 08/08/2024 CLINICAL DATA:  Provided history: Metastatic disease evaluation. EXAM: MRI HEAD WITHOUT AND WITH CONTRAST TECHNIQUE: Multiplanar, multiecho pulse sequences of the brain and surrounding structures were obtained without and with intravenous contrast. CONTRAST:  9mL GADAVIST GADOBUTROL 1 MMOL/ML IV SOLN COMPARISON:  Head CT 08/08/2012. FINDINGS: The axial T1-weighted post-contrast sequence is moderate-to-severely motion degraded. The coronal  T1-weighted post-contrast sequence is moderately motion degraded. Within these limitations, findings are as follows. Brain: Mild  generalized cerebral atrophy. Multifocal T2 FLAIR hyperintense signal abnormality within the cerebral white matter, nonspecific but compatible with minimal chronic small vessel ischemic disease. No cortical encephalomalacia is identified. There is no acute infarct. No evidence of an intracranial mass. No chronic intracranial blood products. No extra-axial fluid collection. No midline shift. Within the limitations of motion degraded post-contrast imaging, there is no pathologic intracranial enhancement. Vascular: Maintained flow voids within the proximal large arterial vessels. Skull and upper cervical spine: No focal worrisome marrow lesion. Incompletely assessed cervical spondylosis. Partially imaged edema within the cervical spinal cord extending inferiorly from the C2 level. Sinuses/Orbits: No mass or acute finding within the imaged orbits. Moderate mucosal thickening within the right maxillary sinus. 18 mm mucous retention cyst, and mild background mucosal thickening, within the left maxillary sinus. Severe mucosal thickening within the right sphenoid sinus. Small fluid level within the left sphenoid sinus. Bilateral ethmoid sinusitis (mild right, moderate left). Moderate right frontal sinusitis. IMPRESSION: 1. Motion degraded post-contrast imaging as described. Within this limitation, there is no evidence of intracranial metastatic disease. 2. Minor chronic small vessel ischemic changes within the cerebral white matter. 3. Mild generalized cerebral atrophy. 4. Paranasal sinus disease as outlined within the body of the report. 5. Partially imaged edema within the cervical spinal cord extending inferiorly from the C2 level. Correlate with findings on same-day thoracic spine MRI and consider a dedicated cervical spine MRI (with and without contrast) for further evaluation. Electronically  Signed   By: Rockey Childs D.O.   On: 08/08/2024 13:13   MR Lumbar Spine W Wo Contrast Result Date: 08/08/2024 EXAM: MRI LUMBAR SPINE 08/08/2024 12:43:22 PM TECHNIQUE: Multiplanar multisequence MRI of the lumbar spine was performed with and without the administration of 9 mL gadobutrol (GADAVIST) 1 MMOL/ML injection. COMPARISON: None available. CLINICAL HISTORY: Low back pain, cauda equina syndrome suspected. FINDINGS: BONES AND ALIGNMENT: Straightening of the normal lumbar lordosis is present. Normal vertebral body heights. Bone marrow signal: Mixed edematous and chronic type 2 Modic changes are present at L2-L3. A small hemangioma is present at L1-L2. Chronic type 2 Modic changes are present posteriorly and asymmetrically on the right at L5-S1. Slight retrolisthesis at L5-S1 is stable. SPINAL CORD: The conus medullaris terminates at L1. SOFT TISSUES: No paraspinal mass. L1-L2: A small hemangioma is present at L1-L2. No significant disc herniation. No spinal canal stenosis or neural foraminal narrowing. L2-L3: Mixed edematous and chronic type 2 Modic changes are present at L2-L3. A broad-based disc protrusion and chronic loss of disc height is present at L2-L3. Moderate facet hypertrophy contributes to moderate foraminal narrowing bilaterally. L3-L4: Broad-based disc protrusion and moderate bilateral facet hypertrophy results in moderate right and mild left subarticular narrowing at L3-L4. Moderate foraminal stenosis is worse on the right. L4-L5: A broad-based disc protrusion is asymmetric to the left at L4-L5. Moderate facet hypertrophy is present. Moderate left-to-mild right subarticular narrowing is present. Moderate foraminal narrowing is present bilaterally. L5-S1: Slight retrolisthesis at L5-S1 is stable. Chronic type 2 Modic changes are present posteriorly and asymmetrically on the right at L5-S1. Facet hypertrophy contributes to moderate foraminal narrowing bilaterally. No significant disc herniation. No  spinal canal stenosis. IMPRESSION: 1. No evidence of cauda equina syndrome. 2. Multilevel degenerative changes with moderate foraminal stenosis at L2-3 and L3-4, and moderate foraminal narrowing at L4-5 and L5-S1. Electronically signed by: Lonni Necessary [name] 08/08/2024 01:08 PM EDT RP Workstation: [id number]   CT ABDOMEN PELVIS WO CONTRAST Result Date: 08/08/2024 CLINICAL DATA:  Anuria. EXAM: CT ABDOMEN  AND PELVIS WITHOUT CONTRAST TECHNIQUE: Multidetector CT imaging of the abdomen and pelvis was performed following the standard protocol without IV contrast. RADIATION DOSE REDUCTION: This exam was performed according to the departmental dose-optimization program which includes automated exposure control, adjustment of the mA and/or kV according to patient size and/or use of iterative reconstruction technique. COMPARISON:  July 08, 2016. FINDINGS: Lower chest: No acute abnormality. Hepatobiliary: No focal liver abnormality is seen. No gallstones, gallbladder wall thickening, or biliary dilatation. Pancreas: Unremarkable. No pancreatic ductal dilatation or surrounding inflammatory changes. Spleen: Normal in size without focal abnormality. Adrenals/Urinary Tract: Adrenal glands and kidneys are unremarkable. No hydronephrosis or renal obstruction is noted. No renal or ureteral calculi are noted. Mild urinary bladder distention is noted. Stomach/Bowel: Stomach is within normal limits. Appendix appears normal. No evidence of bowel wall thickening, distention, or inflammatory changes. Moderate amount of stool seen throughout the colon. Vascular/Lymphatic: No significant vascular findings are present. No enlarged abdominal or pelvic lymph nodes. Reproductive: Mild prostatic enlargement is noted. Other: No abdominal wall hernia or abnormality. No abdominopelvic ascites. Musculoskeletal: No acute or significant osseous findings. IMPRESSION: 1. Mild urinary bladder distention is noted. 2. Mild prostatic enlargement.  3. Moderate stool burden. Electronically Signed   By: Lynwood Landy Raddle M.D.   On: 08/08/2024 08:15   EKG: Not yet performed.   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 > Imaging in the ED included CT abdomen pelvis, MRI brain, MR T-spine, MRI L-spine.  Findings significant for intramedullary lesion at T1-2 with edema extending to C6/7 and possibly extending up to C2 but further cervical imaging will be needed to confirm. > Neurosurgery consulted and recommending completing workup with CT of the chest to further confirm spine lesion as a primary neoplasm.  Also recommended MRI of the C-spine to complete evaluation of the spine.  Steroids also recommended and have been ordered. - Monitor on telemetry overnight - Appreciate neurosurgery recommendations and assistance - Steroids as ordered - Follow-up MR C-spine and CT chest - Supportive care - Hold Eliquis  in anticipation of possible surgical intervention for diagnosis - Supportive care  Hypertension - Continue home amlodipine , Coreg , hydrochlorothiazide   Hyperlipidemia - Continue atorvastatin   History of cirrhosis History of hepatitis C > S/P Harvoni  - LFT stable  History of DVT - Holding Eliquis  as above  History of GI carcinoma > Chart indicates history of GI carcinoid tumor evaluation in 2017/2018.  Was sent to [facility] for consideration of surgical intervention but no intervention was recommended per notes. - Noted  DVT prophylaxis: Lovenox , cleared by neurosurgery for chemoprophylaxis.  Eliquis  held. Code Status:   Full Family Communication:  None on admission  Disposition Plan:   Patient is from:  Home  Anticipated DC to:  Home  Anticipated  DC date:  To 4 days  Anticipated DC barriers: None  Consults called:  Neurosurgery Admission status:  Inpatient, telemetry  Severity of Illness: The appropriate patient status for this patient is INPATIENT. Inpatient status is judged to be reasonable and necessary in order to provide the required intensity of service to ensure the patient's safety. The patient's presenting symptoms, physical exam findings, and initial radiographic and laboratory data in the context of their chronic comorbidities  is felt to place them at high risk for further clinical deterioration. Furthermore, it is not anticipated that the patient will be medically stable for discharge from the hospital within 2 midnights of admission.   * I certify that at the point of admission it is my clinical judgment that the patient will require inpatient hospital care spanning beyond 2 midnights from the point of admission due to high intensity of service, high risk for further deterioration and high frequency of surveillance required.Dustin [name] [facility]  How to contact the [facility] Attending or Consulting provider 7A - 7P or covering provider during after hours 7P -7A, for this patient?   Check the care team in [facility] and look for a) attending/consulting [facility] provider listed and b) the [facility] team listed Log into [email or web address] and use [facility]'s universal password to access. If you do not have the password, please contact the hospital operator. Locate the [facility] provider you are looking for under [facility] and page to a number that you can be directly reached. If you still have difficulty reaching the provider, please page the [facility] (Director on Call) for the Hospitalists listed on amion for assistance.  08/08/2024, 2:33 PM

## 2024-08-08 NOTE — ED Triage Notes (Signed)
 Patient presents to ed via [facility] states he is having tingling in his left leg x 4 days , states its progressively getting worse today c/o toes on his right foot tingling. States he normally doesn't walk with a cane and today isn't able to walk without a cane. States he hasn't been able to urinate since last pm last BM wed. Denies injury

## 2024-08-08 NOTE — ED Provider Notes (Signed)
 [place] EMERGENCY DEPARTMENT AT [facility] Provider Note   CSN: [id number] Arrival date & time: 08/08/24  9360     Patient presents with: Extremity Weakness   Dustin [name] is a 68 y.o. male.   This is a 68 year old male presenting emergency department with back pain and paresthesia to his left leg.  Reports that symptoms started roughly a week ago with paresthesia/tingling in his left toes and foot and has progressively gone up his leg to his buttock, at the same time has developed mid back pain.  Reports that he is having difficulty ambulating due to balance issues and is having to walk with a cane.  He notes difficulty having bowel movement with constipation as well as some difficulty urinating as well.  Denies fevers, no trauma, no constitutional symptoms.  He does note recent dental extraction couple weeks ago, but no events that he can recall that would correlate to presentation today.   Extremity Weakness       Prior to Admission medications   Medication Sig Start Date End Date Taking? Authorizing Provider  acetaminophen  (TYLENOL ) 500 MG tablet Take 1 tablet (500 mg total) by mouth every 6 (six) hours as needed. 06/08/24   Nivia Colon, PA-C  albuterol  (VENTOLIN  HFA) 108 (90 Base) MCG/ACT inhaler INHALE 2 PUFFS INTO THE LUNGS EVERY 6 HOURS AS NEEDED FOR WHEEZING OR SHORTNESS OF BREATH 07/06/24   Newlin, Enobong, MD  amLODipine  (NORVASC ) 10 MG tablet Take 1 tablet (10 mg total) by mouth daily. 01/02/24   Danton Jon HERO, PA-C  amoxicillin  (AMOXIL ) 500 MG capsule Take 1 capsule (500 mg total) by mouth 3 (three) times daily. 06/08/24   Nivia Colon, PA-C  apixaban  (ELIQUIS ) 5 MG TABS tablet Take 1 tablet (5 mg total) by mouth 2 (two) times daily. 04/17/24   Terra Fairy PARAS, PA-C  atorvastatin  (LIPITOR) 20 MG tablet TAKE 1 TABLET(20 MG) BY MOUTH DAILY 03/26/24   Newlin, Enobong, MD  carbamide peroxide (DEBROX) 6.5 % OTIC solution Place 5 drops into both ears 2 (two) times  daily. 06/08/24   Nivia Colon, PA-C  carvedilol  (COREG ) 6.25 MG tablet TAKE 1 TABLET(6.25 MG) BY MOUTH TWICE DAILY WITH A MEAL 07/09/24   Newlin, Enobong, MD  cetirizine  (ZYRTEC ) 10 MG tablet Take 1 tablet (10 mg total) by mouth daily. 01/02/24   Danton Jon HERO, PA-C  diclofenac  Sodium (VOLTAREN ) 1 % GEL APPLY 4 GRAMS EXTERNALLY TO THE AFFECTED AREA FOUR TIMES DAILY 12/13/22   Danton Jon HERO, PA-C  fluticasone  (FLONASE ) 50 MCG/ACT nasal spray PLACE 1 SPRAY IN BOTH NOSTRILS DAILY 06/16/24   Newlin, Enobong, MD  hydrochlorothiazide  (HYDRODIURIL ) 25 MG tablet Take 1 tablet (25 mg total) by mouth daily. 08/01/24   Newlin, Enobong, MD  omeprazole  (PRILOSEC) 20 MG capsule TAKE 1 CAPSULE(20 MG) BY MOUTH DAILY 06/16/24   Newlin, Enobong, MD    Allergies: Patient has no known allergies.    Review of Systems  Musculoskeletal:  Positive for extremity weakness.    Updated Vital Signs BP (!) 151/77 (BP Location: Right Arm)   Pulse 75   Temp 98.5 F (36.9 C) (Oral)   Resp 17   Ht 5' 11 (1.803 m)   Wt 95.3 kg   SpO2 100%   BMI 29.29 kg/m   Physical Exam Vitals and nursing note reviewed.  Constitutional:      General: He is not in acute distress.    Appearance: He is not toxic-appearing.  HENT:  Head: Normocephalic.     Nose: Nose normal.     Mouth/Throat:     Mouth: Mucous membranes are moist.  Eyes:     Conjunctiva/sclera: Conjunctivae normal.  Cardiovascular:     Rate and Rhythm: Normal rate and regular rhythm.  Pulmonary:     Effort: Pulmonary effort is normal.     Breath sounds: Normal breath sounds.  Abdominal:     General: Abdomen is flat. There is no distension.     Palpations: Abdomen is soft.     Tenderness: There is no abdominal tenderness. There is no guarding or rebound.  Musculoskeletal:     Comments: He does have some midline spinal tenderness around the thoracic lumbar junction midline.  He has 5 out of 5 plantarflexion dorsiflexion, able to lift legs off bed, 5 out  of 5 hip extension.  There is subjective paresthesia/dullness to sensation on his left leg as compared to his right.  Equal pulses.  Limbs are warm and well-perfused.  Skin:    General: Skin is warm.     Capillary Refill: Capillary refill takes less than 2 seconds.  Neurological:     Mental Status: He is alert and oriented to person, place, and time.     Cranial Nerves: No cranial nerve deficit.     Sensory: Sensory deficit present.     Motor: No weakness.     Coordination: Coordination normal.  Psychiatric:        Mood and Affect: Mood normal.        Behavior: Behavior normal.     (all labs ordered are listed, but only abnormal results are displayed) Labs Reviewed  COMPREHENSIVE METABOLIC PANEL WITH GFR - Abnormal; Notable for the following components:      Result Value   Potassium 2.8 (*)    Glucose, Bld 110 (*)    Total Bilirubin 1.9 (*)    All other components within normal limits  I-STAT CHEM 8, ED - Abnormal; Notable for the following components:   Potassium 2.8 (*)    Glucose, Bld 109 (*)    Calcium , Ion 1.04 (*)    All other components within normal limits  CBG MONITORING, ED - Abnormal; Notable for the following components:   Glucose-Capillary 104 (*)    All other components within normal limits  CBC  MAGNESIUM  HIV ANTIBODY (ROUTINE TESTING W REFLEX)    EKG: None  Radiology: MR THORACIC SPINE W WO CONTRAST Result Date: 08/08/2024 EXAM: MRI THORACIC SPINE WITH AND WITHOUT INTRAVENOUS CONTRAST 08/08/2024 12:40:09 PM TECHNIQUE: Multiplanar multisequence MRI of the thoracic spine was performed with and without the administration of intravenous contrast. 9 mL (gadobutrol (GADAVIST) 1 MMOL/ML injection 9 mL GADOBUTROL 1 MMOL/ML IV SOLN) was administered. COMPARISON: None available. CLINICAL HISTORY: Mid-back pain, neuro deficit. FINDINGS: BONES AND ALIGNMENT: Normal alignment. Normal vertebral body heights. An 11 mm hemangioma is present superiorly at T12. Bone marrow  signal is otherwise unremarkable. No other abnormal enhancement. SPINAL CORD: Mildly irregular intramedullary enhancement is present in the spinal cord from T1-2 through the mid body of T2 measuring 5 x 13 mm. Extensive cord edema extends superiorly to the highest image level, C6-7 and inferiorly to the level of T7. No other focal enhancement or cord enlargement is present. SOFT TISSUES: Unremarkable. DEGENERATIVE CHANGES: No significant focal disc protrusion or stenosis is present. No neural foraminal narrowing. IMPRESSION: 1. Mildly irregular intramedullary enhancement from T12 to mid T2 (5 x 13 mm) with extensive surrounding cord edema extending  superiorly to C67 and inferiorly to T7. his could represent an intrinsic cord metastasis, primary spinal cord neoplasm is favored. This may represent an ependymoma or spinal cord astrocytoma. 2. No significant focal disc protrusion or stenosis. Electronically signed by: Lonni Necessary MD 08/08/2024 01:17 PM EDT RP Workstation: [id number]   MR Brain W and Wo Contrast Result Date: 08/08/2024 CLINICAL DATA:  Provided history: Metastatic disease evaluation. EXAM: MRI HEAD WITHOUT AND WITH CONTRAST TECHNIQUE: Multiplanar, multiecho pulse sequences of the brain and surrounding structures were obtained without and with intravenous contrast. CONTRAST:  9mL GADAVIST GADOBUTROL 1 MMOL/ML IV SOLN COMPARISON:  Head CT 08/08/2012. FINDINGS: The axial T1-weighted post-contrast sequence is moderate-to-severely motion degraded. The coronal T1-weighted post-contrast sequence is moderately motion degraded. Within these limitations, findings are as follows. Brain: Mild generalized cerebral atrophy. Multifocal T2 FLAIR hyperintense signal abnormality within the cerebral white matter, nonspecific but compatible with minimal chronic small vessel ischemic disease. No cortical encephalomalacia is identified. There is no acute infarct. No evidence of an intracranial mass. No chronic  intracranial blood products. No extra-axial fluid collection. No midline shift. Within the limitations of motion degraded post-contrast imaging, there is no pathologic intracranial enhancement. Vascular: Maintained flow voids within the proximal large arterial vessels. Skull and upper cervical spine: No focal worrisome marrow lesion. Incompletely assessed cervical spondylosis. Partially imaged edema within the cervical spinal cord extending inferiorly from the C2 level. Sinuses/Orbits: No mass or acute finding within the imaged orbits. Moderate mucosal thickening within the right maxillary sinus. 18 mm mucous retention cyst, and mild background mucosal thickening, within the left maxillary sinus. Severe mucosal thickening within the right sphenoid sinus. Small fluid level within the left sphenoid sinus. Bilateral ethmoid sinusitis (mild right, moderate left). Moderate right frontal sinusitis. IMPRESSION: 1. Motion degraded post-contrast imaging as described. Within this limitation, there is no evidence of intracranial metastatic disease. 2. Minor chronic small vessel ischemic changes within the cerebral white matter. 3. Mild generalized cerebral atrophy. 4. Paranasal sinus disease as outlined within the body of the report. 5. Partially imaged edema within the cervical spinal cord extending inferiorly from the C2 level. Correlate with findings on same-day thoracic spine MRI and consider a dedicated cervical spine MRI (with and without contrast) for further evaluation. Electronically Signed   By: Rockey Childs D.O.   On: 08/08/2024 13:13   MR Lumbar Spine W Wo Contrast Result Date: 08/08/2024 EXAM: MRI LUMBAR SPINE 08/08/2024 12:43:22 PM TECHNIQUE: Multiplanar multisequence MRI of the lumbar spine was performed with and without the administration of 9 mL gadobutrol (GADAVIST) 1 MMOL/ML injection. COMPARISON: None available. CLINICAL HISTORY: Low back pain, cauda equina syndrome suspected. FINDINGS: BONES AND  ALIGNMENT: Straightening of the normal lumbar lordosis is present. Normal vertebral body heights. Bone marrow signal: Mixed edematous and chronic type 2 Modic changes are present at L2-L3. A small hemangioma is present at L1-L2. Chronic type 2 Modic changes are present posteriorly and asymmetrically on the right at L5-S1. Slight retrolisthesis at L5-S1 is stable. SPINAL CORD: The conus medullaris terminates at L1. SOFT TISSUES: No paraspinal mass. L1-L2: A small hemangioma is present at L1-L2. No significant disc herniation. No spinal canal stenosis or neural foraminal narrowing. L2-L3: Mixed edematous and chronic type 2 Modic changes are present at L2-L3. A broad-based disc protrusion and chronic loss of disc height is present at L2-L3. Moderate facet hypertrophy contributes to moderate foraminal narrowing bilaterally. L3-L4: Broad-based disc protrusion and moderate bilateral facet hypertrophy results in moderate right and mild left subarticular narrowing  at L3-L4. Moderate foraminal stenosis is worse on the right. L4-L5: A broad-based disc protrusion is asymmetric to the left at L4-L5. Moderate facet hypertrophy is present. Moderate left-to-mild right subarticular narrowing is present. Moderate foraminal narrowing is present bilaterally. L5-S1: Slight retrolisthesis at L5-S1 is stable. Chronic type 2 Modic changes are present posteriorly and asymmetrically on the right at L5-S1. Facet hypertrophy contributes to moderate foraminal narrowing bilaterally. No significant disc herniation. No spinal canal stenosis. IMPRESSION: 1. No evidence of cauda equina syndrome. 2. Multilevel degenerative changes with moderate foraminal stenosis at L2-3 and L3-4, and moderate foraminal narrowing at L4-5 and L5-S1. Electronically signed by: Lonni Necessary MD 08/08/2024 01:08 PM EDT RP Workstation: [id number]   CT ABDOMEN PELVIS WO CONTRAST Result Date: 08/08/2024 CLINICAL DATA:  Anuria. EXAM: CT ABDOMEN AND PELVIS WITHOUT  CONTRAST TECHNIQUE: Multidetector CT imaging of the abdomen and pelvis was performed following the standard protocol without IV contrast. RADIATION DOSE REDUCTION: This exam was performed according to the departmental dose-optimization program which includes automated exposure control, adjustment of the mA and/or kV according to patient size and/or use of iterative reconstruction technique. COMPARISON:  July 08, 2016. FINDINGS: Lower chest: No acute abnormality. Hepatobiliary: No focal liver abnormality is seen. No gallstones, gallbladder wall thickening, or biliary dilatation. Pancreas: Unremarkable. No pancreatic ductal dilatation or surrounding inflammatory changes. Spleen: Normal in size without focal abnormality. Adrenals/Urinary Tract: Adrenal glands and kidneys are unremarkable. No hydronephrosis or renal obstruction is noted. No renal or ureteral calculi are noted. Mild urinary bladder distention is noted. Stomach/Bowel: Stomach is within normal limits. Appendix appears normal. No evidence of bowel wall thickening, distention, or inflammatory changes. Moderate amount of stool seen throughout the colon. Vascular/Lymphatic: No significant vascular findings are present. No enlarged abdominal or pelvic lymph nodes. Reproductive: Mild prostatic enlargement is noted. Other: No abdominal wall hernia or abnormality. No abdominopelvic ascites. Musculoskeletal: No acute or significant osseous findings. IMPRESSION: 1. Mild urinary bladder distention is noted. 2. Mild prostatic enlargement. 3. Moderate stool burden. Electronically Signed   By: Lynwood Landy Raddle M.D.   On: 08/08/2024 08:15     Procedures   Medications Ordered in the ED  potassium chloride  10 mEq in 100 mL IVPB (10 mEq Intravenous Incomplete 08/08/24 1039)  potassium chloride  10 mEq in 100 mL IVPB (has no administration in time range)  dexamethasone  (DECADRON ) tablet 4 mg (has no administration in time range)  amLODipine  (NORVASC ) tablet 10 mg  (has no administration in time range)  atorvastatin  (LIPITOR) tablet 20 mg (has no administration in time range)  carvedilol  (COREG ) tablet 6.25 mg (has no administration in time range)  hydrochlorothiazide  (HYDRODIURIL ) tablet 25 mg (has no administration in time range)  pantoprazole (PROTONIX) EC tablet 40 mg (has no administration in time range)  albuterol  (VENTOLIN  HFA) 108 (90 Base) MCG/ACT inhaler 2 puff (has no administration in time range)  sodium chloride  flush (NS) 0.9 % injection 3 mL (has no administration in time range)  acetaminophen  (TYLENOL ) tablet 650 mg (has no administration in time range)    Or  acetaminophen  (TYLENOL ) suppository 650 mg (has no administration in time range)  polyethylene glycol (MIRALAX  / GLYCOLAX ) packet 17 g (has no administration in time range)  potassium chloride  (KLOR-CON ) CR tablet 40 mEq (40 mEq Oral Given 08/08/24 1028)  gadobutrol (GADAVIST) 1 MMOL/ML injection 9 mL (9 mLs Intravenous Contrast Given 08/08/24 1236)  iohexol (OMNIPAQUE) 350 MG/ML injection 75 mL (75 mLs Intravenous Contrast Given 08/08/24 1503)    Clinical Course  as of 08/08/24 1509  Fri Aug 08, 2024  0828 CT ABDOMEN PELVIS WO CONTRAST IMPRESSION: 1. Mild urinary bladder distention is noted. 2. Mild prostatic enlargement. 3. Moderate stool burden.   Electronically Signed   By: Lynwood Landy Raddle M.D.   On: 08/08/2024 08:15   [TY]  0932 Potassium(!): 2.8 Will give oral and IV potassium [TY]  0932 CBC No leukocytosis to suggest infectious process.  No anemia. [TY]  1311 MR Lumbar Spine W Wo Contrast IMPRESSION: 1. No evidence of cauda equina syndrome. 2. Multilevel degenerative changes with moderate foraminal stenosis at L2-3 and L3-4, and moderate foraminal narrowing at L4-5 and L5-S1.  Electronically signed by: Lonni Necessary MD 08/08/2024 01:08 PM EDT RP Workstation: [id number]   [TY]  1322 MR THORACIC SPINE W WO CONTRAST IMPRESSION: 1. Mildly irregular  intramedullary enhancement from T12 to mid T2 (5 x 13 mm) with extensive surrounding cord edema extending superiorly to C67 and inferiorly to T7. his could represent an intrinsic cord metastasis, primary spinal cord neoplasm is favored. This may represent an ependymoma or spinal cord astrocytoma. 2. No significant focal disc protrusion or stenosis.  Electronically signed by: Lonni Necessary MD 08/08/2024 01:17 PM EDT RP Workstation: [id number]   [TY]  1355 Case discussed with neurosurgery agrees with steroids and admission.  Will evaluate make further recommendations. [TY]  1435 Spoke with Dr. Melvan for admission.  Agrees to see and admit patient [TY]    Clinical Course User Index [TY] Neysa Caron PARAS, DO                                 Medical Decision Making This is a 68 year old male with history of hypertension, carcinoid tumor, blood clots who presented for paresthesia to his lower extremities and back pain.  He is afebrile nontachycardic, slightly hypertensive.  Physical exam with retained strength/motor in lower extremities, but does has paresthesia/tingling and dullness to sensation to the left leg as compared to his right.  Also having some midline tenderness.  Concern for acute spinal pathology will get MRIs to evaluate.  He has no other neurodeficits, lower suspicion for stroke, however will get MRI brain.  Will get basic screening labs.  See ED course for further MDM and final disposition.  Amount and/or Complexity of Data Reviewed Labs: ordered. Decision-making details documented in ED Course. Radiology: ordered. Decision-making details documented in ED Course.  Risk Prescription drug management. Decision regarding hospitalization.      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

## 2024-08-09 ENCOUNTER — Inpatient Hospital Stay (HOSPITAL_COMMUNITY)

## 2024-08-09 DIAGNOSIS — M4802 Spinal stenosis, cervical region: Secondary | ICD-10-CM | POA: Diagnosis not present

## 2024-08-09 DIAGNOSIS — G9519 Other vascular myelopathies: Secondary | ICD-10-CM | POA: Diagnosis not present

## 2024-08-09 DIAGNOSIS — D497 Neoplasm of unspecified behavior of endocrine glands and other parts of nervous system: Secondary | ICD-10-CM | POA: Diagnosis not present

## 2024-08-09 DIAGNOSIS — M5020 Other cervical disc displacement, unspecified cervical region: Secondary | ICD-10-CM | POA: Diagnosis not present

## 2024-08-09 DIAGNOSIS — C799 Secondary malignant neoplasm of unspecified site: Secondary | ICD-10-CM | POA: Diagnosis not present

## 2024-08-09 LAB — COMPREHENSIVE METABOLIC PANEL WITH GFR
ALT: 18 U/L (ref 0–44)
AST: 19 U/L (ref 15–41)
Albumin: 3.8 g/dL (ref 3.5–5.0)
Alkaline Phosphatase: 51 U/L (ref 38–126)
Anion gap: 15 (ref 5–15)
BUN: 18 mg/dL (ref 8–23)
CO2: 24 mmol/L (ref 22–32)
Calcium: 9.6 mg/dL (ref 8.9–10.3)
Chloride: 99 mmol/L (ref 98–111)
Creatinine, Ser: 0.97 mg/dL (ref 0.61–1.24)
GFR, Estimated: >60 mL/min (ref >60)
Glucose, Bld: 129 mg/dL — ABNORMAL HIGH (ref 70–99)
Potassium: 4 mmol/L (ref 3.5–5.1)
Sodium: 138 mmol/L (ref 135–145)
Total Bilirubin: 2.1 mg/dL — ABNORMAL HIGH (ref 0.0–1.2)
Total Protein: 7.6 g/dL (ref 6.5–8.1)

## 2024-08-09 LAB — CBC
HCT: 42 % (ref 39.0–52.0)
Hemoglobin: 14.3 g/dL (ref 13.0–17.0)
MCH: 30.2 pg (ref 26.0–34.0)
MCHC: 34 g/dL (ref 30.0–36.0)
MCV: 88.6 fL (ref 80.0–100.0)
Platelets: 251 K/uL (ref 150–400)
RBC: 4.74 MIL/uL (ref 4.22–5.81)
RDW: 13.2 % (ref 11.5–15.5)
WBC: 3.8 K/uL — ABNORMAL LOW (ref 4.0–10.5)
nRBC: 0 % (ref 0.0–0.2)

## 2024-08-09 LAB — HIV ANTIBODY (ROUTINE TESTING W REFLEX): HIV Screen 4th Generation wRfx: NONREACTIVE

## 2024-08-09 MED ORDER — GADOBUTROL 1 MMOL/ML IV SOLN
9.0000 mL | Freq: Once | INTRAVENOUS | Status: AC | PRN
  Administered 2024-08-09: 9 mL via INTRAVENOUS

## 2024-08-09 MED ORDER — CHLORHEXIDINE GLUCONATE CLOTH 2 % EX PADS
6.0000 | MEDICATED_PAD | Freq: Every day | CUTANEOUS | Status: DC
Start: 2024-08-09 — End: 2024-08-24
  Administered 2024-08-09 – 2024-08-24 (×14): 6 via TOPICAL

## 2024-08-09 MED ORDER — LORAZEPAM 1 MG PO TABS
1.0000 mg | ORAL_TABLET | Freq: Once | ORAL | Status: AC | PRN
  Administered 2024-08-09: 1 mg via ORAL
  Filled 2024-08-09: qty 1

## 2024-08-09 MED ORDER — DIAZEPAM 5 MG PO TABS
5.0000 mg | ORAL_TABLET | Freq: Once | ORAL | Status: AC | PRN
Start: 2024-08-09 — End: 2024-08-11
  Administered 2024-08-11: 5 mg via ORAL
  Filled 2024-08-09: qty 1

## 2024-08-09 MED ORDER — DEXAMETHASONE 4 MG PO TABS
4.0000 mg | ORAL_TABLET | Freq: Four times a day (QID) | ORAL | Status: DC
  Administered 2024-08-09 – 2024-08-15 (×21): 4 mg via ORAL
  Filled 2024-08-09 (×21): qty 1

## 2024-08-09 NOTE — Progress Notes (Signed)
 Assessment 68 y/o M, hx DVT on Eliquis , cirrhosis who presents with progressive thoracic myelopathy over 1 week and imaging demonstrating cervicothoracic intramedullary edema and T2 intramedullary lesion. Ddx includes metastasis, cavernoma, hemangioblastoma, astrocytoma, ependymoma, lymphoma, etc  LOS: 2 days    Plan: Decadron  4q6 In setting of spinal cord lesion with metastasis as consideration, would recommend a biopsy of the splenic lesion.    Subjective: MRI shows cord edema throughout entire Cspine but no intrinsic lesion other than the previously mentioned T2 lesion. No change neurologically today   Objective: Vital signs in last 24 hours: Temp:  [97.6 F (36.4 C)-97.8 F (36.6 C)] 97.6 F (36.4 C) (10/12 0812) Pulse Rate:  [64-88] 81 (10/12 0812) Resp:  [16-18] 16 (10/12 0812) BP: (111-159)/(66-98) 145/94 (10/12 0812) SpO2:  [96 %-100 %] 96 % (10/12 0812)  Intake/Output from previous day: 10/11 0701 - 10/12 0700 In: -  Out: 2175 [Urine:2175] Intake/Output this shift: No intake/output data recorded.  Exam: Awake, alert BUE full strength and sensation LLE: subjective numbness from the inguinal ligament distally. Full strength RLE: subjective numbness of the right foot. Full strength  Lab Results: Recent Labs    08/08/24 0842 08/08/24 0848 08/09/24 0707  WBC 4.0  --  3.8*  HGB 13.3 13.9 14.3  HCT 39.6 41.0 42.0  PLT 228  --  251   BMET Recent Labs    08/08/24 0842 08/08/24 0848 08/09/24 0707  NA 138 140 138  K 2.8* 2.8* 4.0  CL 100 102 99  CO2 26  --  24  GLUCOSE 110* 109* 129*  BUN 13 16 18   CREATININE 0.97 1.00 0.97  CALCIUM  9.4  --  9.6        [name] 08/10/2024, 9:16 AM

## 2024-08-09 NOTE — Progress Notes (Signed)
Foley catheter placed per order.

## 2024-08-09 NOTE — Progress Notes (Signed)
 PROGRESS NOTE  Dustin [name] FMW:[id number] DOB: October 28, 1956 DOA: 08/08/2024 PCP: Delbert Clam, MD   LOS: 1 day   Brief Narrative / Interim history: 68 year old male with HTN, HLD, hep C, liver cirrhosis, prior DVT, GI carcinoid tumor who comes into the hospital with left leg tingling and balance issues for the past week.  He also reported difficulties with ambulation as well as difficulties urinating.  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  Neurosurgery consulted, an MRI of the C-spine is pending - Has been started on dexamethasone , continue - CT scan of the chest abdomen pelvis showed a nonspecific hyperenhancing lesion in the spleen, query metastasis versus others  Active problems Essential hypertension-blood pressure normal/high, continue amlodipine , Coreg , hydrochlorothiazide   Hyperlipidemia-continue statin  History of hep C, cirrhosis-treated with Harvoni .  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 Was sent to [facility] for consideration of surgical intervention but no intervention was recommended per notes   Scheduled Meds:  amLODipine   10 mg Oral Daily   atorvastatin   20 mg Oral Daily   carvedilol   6.25 mg Oral BID WC   Chlorhexidine  Gluconate Cloth  6 each Topical Daily   dexamethasone   4 mg Oral Q12H   enoxaparin  (LOVENOX ) injection  40 mg Subcutaneous Q24H   hydrochlorothiazide   25 mg Oral Daily   pantoprazole  40 mg Oral Daily    sodium chloride  flush  3 mL Intravenous Q12H   Continuous Infusions:  potassium chloride      PRN Meds:.acetaminophen  **OR** acetaminophen , albuterol , HYDROcodone -acetaminophen , LORazepam, methocarbamol , polyethylene glycol  Current Outpatient Medications  Medication Instructions   acetaminophen  (TYLENOL ) 500 mg, Oral, Every 6 hours PRN   acetaminophen -codeine  (TYLENOL  #3) 300-30 MG tablet 1 tablet, Oral, Every 6 hours PRN   albuterol  (VENTOLIN  HFA) 108 (90 Base) MCG/ACT inhaler 2 puffs, Inhalation, Every 6 hours PRN   amLODipine  (NORVASC ) 10 mg, Oral, Daily   apixaban  (ELIQUIS ) 5 mg, Oral, 2 times daily   atorvastatin  (LIPITOR) 20 MG tablet TAKE 1 TABLET(20 MG) BY MOUTH DAILY   carvedilol  (COREG ) 6.25 MG tablet TAKE 1 TABLET(6.25 MG) BY MOUTH TWICE DAILY WITH A MEAL   cetirizine  (ZYRTEC ) 10 mg, Oral, Daily   diclofenac  Sodium (VOLTAREN ) 1 % GEL APPLY 4 GRAMS EXTERNALLY TO THE AFFECTED AREA FOUR TIMES DAILY   fluticasone  (FLONASE ) 50 MCG/ACT nasal spray PLACE 1 SPRAY IN BOTH NOSTRILS DAILY   hydrochlorothiazide  (HYDRODIURIL ) 25 mg, Oral, Daily   omeprazole  (PRILOSEC) 20 MG capsule TAKE 1 CAPSULE(20 MG) BY MOUTH DAILY   traMADol  (ULTRAM ) 50 mg, Oral, Every 6 hours PRN    Diet Orders (From admission, onward)     Start     Ordered   08/08/24 1510  Diet regular Fluid consistency: Thin  Diet effective now       Question:  Fluid consistency:  Answer:  Thin   08/08/24 1510            DVT prophylaxis: enoxaparin  (LOVENOX ) injection 40 mg Start: 08/08/24 1600   Lab Results  Component Value Date   PLT  251 08/09/2024      Code Status: Full Code  Family Communication: No family at bedside  Status is: Inpatient Remains inpatient appropriate because: Severity of illness   Level of care: Telemetry Surgical  Consultants:  Neurosurgery  Objective: Vitals:   08/08/24 2322 08/09/24 0346 08/09/24 0754 08/09/24 1042  BP: 122/79 139/84 128/80 (!) 159/88  Pulse: 70 80 66    Resp: 16  18   Temp: 97.7 F (36.5 C) 97.7 F (36.5 C) 97.7 F (36.5 C)   TempSrc: Oral Oral Oral   SpO2: 96% 100% 98%   Weight:      Height:        Intake/Output Summary (Last 24 hours) at 08/09/2024 1105 Last data filed at 08/09/2024 0450 Gross per 24 hour  Intake --  Output 400 ml  Net -400 ml   Wt Readings from Last 3 Encounters:  08/08/24 95.3 kg  08/05/24 95.2 kg  06/08/24 97.1 kg    Examination:  Constitutional: NAD Eyes: no scleral icterus ENMT: Mucous membranes are moist.  Neck: normal, supple Respiratory: clear to auscultation bilaterally, no wheezing, no crackles.  Cardiovascular: Regular rate and rhythm, no murmurs / rubs / gallops. No LE edema.  Abdomen: non distended, no tenderness. Bowel sounds positive.  Musculoskeletal: no clubbing / cyanosis.    Data Reviewed: I have independently reviewed following labs and imaging studies   CBC Recent Labs  Lab 08/08/24 0842 08/08/24 0848 08/09/24 0707  WBC 4.0  --  3.8*  HGB 13.3 13.9 14.3  HCT 39.6 41.0 42.0  PLT 228  --  251  MCV 89.4  --  88.6  MCH 30.0  --  30.2  MCHC 33.6  --  34.0  RDW 13.3  --  13.2    Recent Labs  Lab 08/08/24 0842 08/08/24 0848 08/08/24 1009 08/09/24 0707  NA 138 140  --  138  K 2.8* 2.8*  --  4.0  CL 100 102  --  99  CO2 26  --   --  24  GLUCOSE 110* 109*  --  129*  BUN 13 16  --  18  CREATININE 0.97 1.00  --  0.97  CALCIUM  9.4  --   --  9.6  AST 25  --   --  19  ALT 22  --   --  18  ALKPHOS 45  --   --  51  BILITOT 1.9*  --   --  2.1*  ALBUMIN 4.1  --   --  3.8  MG  --   --  2.1  --     ------------------------------------------------------------------------------------------------------------------ No results for input(s): CHOL, HDL, LDLCALC, TRIG, CHOLHDL, LDLDIRECT in the last 72 hours.  Lab Results  Component Value Date   HGBA1C 6.0 05/05/2024    ------------------------------------------------------------------------------------------------------------------ No results for input(s): TSH, T4TOTAL, T3FREE, THYROIDAB in the last 72 hours.  Invalid input(s): FREET3  Cardiac Enzymes No results for input(s): CKMB, TROPONINI, MYOGLOBIN in the last 168 hours.  Invalid input(s): CK ------------------------------------------------------------------------------------------------------------------ No results found for: BNP  CBG: Recent Labs  Lab 08/08/24 0843  GLUCAP 104*    No results found for this or any previous visit (from the past 240 hours).   Radiology Studies: CT CHEST ABDOMEN PELVIS W CONTRAST Result Date: 08/08/2024 CLINICAL DATA:  Metastatic disease evaluation. History of spinal cord neoplasm. Weakness. EXAM: CT CHEST, ABDOMEN, AND PELVIS WITH CONTRAST TECHNIQUE: Multidetector CT imaging of the chest, abdomen and pelvis was performed following the standard  protocol during bolus administration of intravenous contrast. RADIATION DOSE REDUCTION: This exam was performed according to the departmental dose-optimization program which includes automated exposure control, adjustment of the mA and/or kV according to patient size and/or use of iterative reconstruction technique. CONTRAST:  75mL OMNIPAQUE IOHEXOL 350 MG/ML SOLN COMPARISON:  CT abdomen and pelvis 08/08/2024. Chest radiographs 04/09/2024 FINDINGS: CT CHEST FINDINGS Cardiovascular: Normal heart size. No pericardial effusions. Normal caliber thoracic aorta. No aortic dissection. Great vessel origins are patent. Central pulmonary arteries are patent without evidence of significant pulmonary embolus. Mediastinum/Nodes: Esophagus is decompressed. No significant lymphadenopathy. Thyroid  gland is unremarkable. Lungs/Pleura: There is a small focal area of consolidation in the right lung base associated with an area of mucous plugging. This could represent focal  pneumonia or aspiration. Focal scarring in the anterior left lung base. No significant pulmonary nodules or mass lesions identified. No pleural effusion or pneumothorax. Musculoskeletal: Degenerative changes in the spine. No focal bone lesions. CT ABDOMEN PELVIS FINDINGS Hepatobiliary: Mild diffuse fatty infiltration of the liver. No focal lesions. Gallbladder and bile ducts are normal. Pancreas: Unremarkable. No pancreatic ductal dilatation or surrounding inflammatory changes. Spleen: There is a large mostly homogeneous hyperenhancing lesion centrally in the spleen, measuring 6.5 x 6.5 cm in diameter. Adrenals/Urinary Tract: No adrenal gland nodules. Kidneys are symmetrical. Homogeneous nephrograms. No solid mass lesions. No hydronephrosis or hydroureter. Bladder is normal. Stomach/Bowel: Stomach, small bowel, and colon are not abnormally distended. No wall thickening or inflammatory stranding are appreciated. Appendix is normal. Diverticulosis of the sigmoid colon without evidence of acute diverticulitis. Vascular/Lymphatic: No significant vascular findings are present. No enlarged abdominal or pelvic lymph nodes. Reproductive: Prostate gland is enlarged, measuring 5.6 cm diameter. Other: No free air or free fluid in the abdomen. Abdominal wall musculature appears intact. Musculoskeletal: Degenerative changes in the spine. No focal bone lesions. IMPRESSION: 1. Focal area of infiltration associated with mucous plugging in the right lung base. This may represent aspiration or pneumonia. 2. No evidence of metastatic disease in the chest. 3. Large hyperenhancing lesion in the spleen. This was not present on the prior MRI abdomen from 07/18/2016. Differential diagnosis would include hemangioma, hamartoma, metastasis, or less likely lymphoma. Consider MRI for further evaluation. 4. No other possible metastatic lesions are identified in the abdomen or pelvis. 5. Prostate gland is enlarged. Electronically Signed   By:  Elsie Gravely M.D.   On: 08/08/2024 15:35   MR THORACIC SPINE W WO CONTRAST Result Date: 08/08/2024 EXAM: MRI THORACIC SPINE WITH AND WITHOUT INTRAVENOUS CONTRAST 08/08/2024 12:40:09 PM TECHNIQUE: Multiplanar multisequence MRI of the thoracic spine was performed with and without the administration of intravenous contrast. 9 mL (gadobutrol (GADAVIST) 1 MMOL/ML injection 9 mL GADOBUTROL 1 MMOL/ML IV SOLN) was administered. COMPARISON: None available. CLINICAL HISTORY: Mid-back pain, neuro deficit. FINDINGS: BONES AND ALIGNMENT: Normal alignment. Normal vertebral body heights. An 11 mm hemangioma is present superiorly at T12. Bone marrow signal is otherwise unremarkable. No other abnormal enhancement. SPINAL CORD: Mildly irregular intramedullary enhancement is present in the spinal cord from T1-2 through the mid body of T2 measuring 5 x 13 mm. Extensive cord edema extends superiorly to the highest image level, C6-7 and inferiorly to the level of T7. No other focal enhancement or cord enlargement is present. SOFT TISSUES: Unremarkable. DEGENERATIVE CHANGES: No significant focal disc protrusion or stenosis is present. No neural foraminal narrowing. IMPRESSION: 1. Mildly irregular intramedullary enhancement from T12 to mid T2 (5 x 13 mm) with extensive surrounding cord edema extending superiorly  to C67 and inferiorly to T7. his could represent an intrinsic cord metastasis, primary spinal cord neoplasm is favored. This may represent an ependymoma or spinal cord astrocytoma. 2. No significant focal disc protrusion or stenosis. Electronically signed by: Lonni Necessary MD 08/08/2024 01:17 PM EDT RP Workstation: [id number]   MR Brain W and Wo Contrast Result Date: 08/08/2024 CLINICAL DATA:  Provided history: Metastatic disease evaluation. EXAM: MRI HEAD WITHOUT AND WITH CONTRAST TECHNIQUE: Multiplanar, multiecho pulse sequences of the brain and surrounding structures were obtained without and with intravenous  contrast. CONTRAST:  9mL GADAVIST GADOBUTROL 1 MMOL/ML IV SOLN COMPARISON:  Head CT 08/08/2012. FINDINGS: The axial T1-weighted post-contrast sequence is moderate-to-severely motion degraded. The coronal T1-weighted post-contrast sequence is moderately motion degraded. Within these limitations, findings are as follows. Brain: Mild generalized cerebral atrophy. Multifocal T2 FLAIR hyperintense signal abnormality within the cerebral white matter, nonspecific but compatible with minimal chronic small vessel ischemic disease. No cortical encephalomalacia is identified. There is no acute infarct. No evidence of an intracranial mass. No chronic intracranial blood products. No extra-axial fluid collection. No midline shift. Within the limitations of motion degraded post-contrast imaging, there is no pathologic intracranial enhancement. Vascular: Maintained flow voids within the proximal large arterial vessels. Skull and upper cervical spine: No focal worrisome marrow lesion. Incompletely assessed cervical spondylosis. Partially imaged edema within the cervical spinal cord extending inferiorly from the C2 level. Sinuses/Orbits: No mass or acute finding within the imaged orbits. Moderate mucosal thickening within the right maxillary sinus. 18 mm mucous retention cyst, and mild background mucosal thickening, within the left maxillary sinus. Severe mucosal thickening within the right sphenoid sinus. Small fluid level within the left sphenoid sinus. Bilateral ethmoid sinusitis (mild right, moderate left). Moderate right frontal sinusitis. IMPRESSION: 1. Motion degraded post-contrast imaging as described. Within this limitation, there is no evidence of intracranial metastatic disease. 2. Minor chronic small vessel ischemic changes within the cerebral white matter. 3. Mild generalized cerebral atrophy. 4. Paranasal sinus disease as outlined within the body of the report. 5. Partially imaged edema within the cervical spinal cord  extending inferiorly from the C2 level. Correlate with findings on same-day thoracic spine MRI and consider a dedicated cervical spine MRI (with and without contrast) for further evaluation. Electronically Signed   By: Rockey Childs D.O.   On: 08/08/2024 13:13   MR Lumbar Spine W Wo Contrast Result Date: 08/08/2024 EXAM: MRI LUMBAR SPINE 08/08/2024 12:43:22 PM TECHNIQUE: Multiplanar multisequence MRI of the lumbar spine was performed with and without the administration of 9 mL gadobutrol (GADAVIST) 1 MMOL/ML injection. COMPARISON: None available. CLINICAL HISTORY: Low back pain, cauda equina syndrome suspected. FINDINGS: BONES AND ALIGNMENT: Straightening of the normal lumbar lordosis is present. Normal vertebral body heights. Bone marrow signal: Mixed edematous and chronic type 2 Modic changes are present at L2-L3. A small hemangioma is present at L1-L2. Chronic type 2 Modic changes are present posteriorly and asymmetrically on the right at L5-S1. Slight retrolisthesis at L5-S1 is stable. SPINAL CORD: The conus medullaris terminates at L1. SOFT TISSUES: No paraspinal mass. L1-L2: A small hemangioma is present at L1-L2. No significant disc herniation. No spinal canal stenosis or neural foraminal narrowing. L2-L3: Mixed edematous and chronic type 2 Modic changes are present at L2-L3. A broad-based disc protrusion and chronic loss of disc height is present at L2-L3. Moderate facet hypertrophy contributes to moderate foraminal narrowing bilaterally. L3-L4: Broad-based disc protrusion and moderate bilateral facet hypertrophy results in moderate right and mild left subarticular narrowing at  L3-L4. Moderate foraminal stenosis is worse on the right. L4-L5: A broad-based disc protrusion is asymmetric to the left at L4-L5. Moderate facet hypertrophy is present. Moderate left-to-mild right subarticular narrowing is present. Moderate foraminal narrowing is present bilaterally. L5-S1: Slight retrolisthesis at L5-S1 is  stable. Chronic type 2 Modic changes are present posteriorly and asymmetrically on the right at L5-S1. Facet hypertrophy contributes to moderate foraminal narrowing bilaterally. No significant disc herniation. No spinal canal stenosis. IMPRESSION: 1. No evidence of cauda equina syndrome. 2. Multilevel degenerative changes with moderate foraminal stenosis at L2-3 and L3-4, and moderate foraminal narrowing at L4-5 and L5-S1. Electronically signed by: Lonni Necessary MD 08/08/2024 01:08 PM EDT RP Workstation: [id number]   Nilda Fendt, MD, PhD [facility]  Between 7 am - 7 pm I am available, please contact me via Amion (for emergencies) or Securechat (non urgent messages)  Between 7 pm - 7 am I am not available, please contact night coverage MD/APP via Amion

## 2024-08-09 NOTE — Plan of Care (Signed)

## 2024-08-09 NOTE — Plan of Care (Signed)
   Problem: Coping: Goal: Level of anxiety will decrease Outcome: Progressing   Problem: Safety: Goal: Ability to remain free from injury will improve Outcome: Progressing   Problem: Skin Integrity: Goal: Risk for impaired skin integrity will decrease Outcome: Progressing

## 2024-08-09 NOTE — Progress Notes (Signed)
 Patient has not been able to void on his own. Bladder scan done twice this shift. I&O catheter done with 400 cc urine return. On call provider was notified.

## 2024-08-09 NOTE — Progress Notes (Signed)
 Patient had assisted fall while bathing with NT. VS stable. No visible injuries noted. Attending physician and charge nurse notified. Safetyzone report completed.Post Fall Huddle Form completed. Will continue to monitor.

## 2024-08-09 NOTE — Progress Notes (Addendum)
 Assessment 68 y/o M, hx DVT on Eliquis , cirrhosis who presents with progressive thoracic myelopathy over 1 week and imaging demonstrating cervicothoracic intramedullary edema and T2 intramedullary lesion   LOS: 1 day    Plan: Await MRI-Cspine Decadron  increased to 4q6 Defer further splenic imaging to primary team. In setting of spinal cord lesion with metastasis as consideration, would recommend a biopsy of the splenic lesion. Ddx includes metastasis, cavernoma, hemangioblastoma, astrocytoma, lymphoma, etc   Subjective: CTCAP shows a hyperenhancing splenic lesion. Steroids started - slight improvement in thoracic back pain but unchanged BLE function  Objective: Vital signs in last 24 hours: Temp:  [97.7 F (36.5 C)-98.5 F (36.9 C)] 97.7 F (36.5 C) (10/11 0754) Pulse Rate:  [66-80] 66 (10/11 0754) Resp:  [16-18] 18 (10/11 0754) BP: (122-151)/(74-84) 128/80 (10/11 0754) SpO2:  [94 %-100 %] 98 % (10/11 0754)  Intake/Output from previous day: 10/10 0701 - 10/11 0700 In: -  Out: 400 [Urine:400] Intake/Output this shift: No intake/output data recorded.  Exam: Awake, alert BUE full strength and sensation LLE: subjective numbness from the inguinal ligament distally. Full strength RLE: subjective numbness of the right foot. Full strength  Lab Results: Recent Labs    08/08/24 0842 08/08/24 0848 08/09/24 0707  WBC 4.0  --  3.8*  HGB 13.3 13.9 14.3  HCT 39.6 41.0 42.0  PLT 228  --  251   BMET Recent Labs    08/08/24 0842 08/08/24 0848 08/09/24 0707  NA 138 140 138  K 2.8* 2.8* 4.0  CL 100 102 99  CO2 26  --  24  GLUCOSE 110* 109* 129*  BUN 13 16 18   CREATININE 0.97 1.00 0.97  CALCIUM  9.4  --  9.6        [name] 08/09/2024, 9:54 AM

## 2024-08-10 DIAGNOSIS — D497 Neoplasm of unspecified behavior of endocrine glands and other parts of nervous system: Secondary | ICD-10-CM | POA: Diagnosis not present

## 2024-08-10 NOTE — Progress Notes (Signed)
 PROGRESS NOTE  Dustin [name] FMW:[id number] DOB: May 14, 1956 DOA: 08/08/2024 PCP: Delbert Clam, MD   LOS: 2 days   Brief Narrative / Interim history: 68 year old male with HTN, HLD, hep C, liver cirrhosis, prior DVT, GI carcinoid tumor who comes into the hospital with left leg tingling and balance issues for the past week.  He also reported difficulties with ambulation as well as difficulties urinating.  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  Appreciate neurosurgery follow-up - Has been started on dexamethasone , continue - CT scan of the chest abdomen pelvis showed a nonspecific hyperenhancing lesion in the spleen, query metastasis versus others.  IR consulted today to see if this lesion could be biopsied  Active problems Essential hypertension-blood pressure overall acceptable today, continue amlodipine , Coreg , hydrochlorothiazide   Hyperlipidemia-continue statin  History of hep C, cirrhosis-treated with Harvoni .  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 Was sent to [facility] for consideration of surgical intervention but no intervention was recommended per notes   Scheduled Meds:  amLODipine   10 mg Oral Daily    atorvastatin   20 mg Oral Daily   carvedilol   6.25 mg Oral BID WC   Chlorhexidine  Gluconate Cloth  6 each Topical Daily   dexamethasone   4 mg Oral Q6H   enoxaparin  (LOVENOX ) injection  40 mg Subcutaneous Q24H   hydrochlorothiazide   25 mg Oral Daily   pantoprazole  40 mg Oral Daily   sodium chloride  flush  3 mL Intravenous Q12H   Continuous Infusions:  potassium chloride      PRN Meds:.acetaminophen  **OR** acetaminophen , albuterol , diazepam, HYDROcodone -acetaminophen , methocarbamol , polyethylene glycol  Current Outpatient Medications  Medication Instructions   acetaminophen  (TYLENOL ) 500 mg, Oral, Every 6 hours PRN   acetaminophen -codeine  (TYLENOL  #3) 300-30 MG tablet 1 tablet, Oral, Every 6 hours PRN   albuterol  (VENTOLIN  HFA) 108 (90 Base) MCG/ACT inhaler 2 puffs, Inhalation, Every 6 hours PRN   amLODipine  (NORVASC ) 10 mg, Oral, Daily   apixaban  (ELIQUIS ) 5 mg, Oral, 2 times daily   atorvastatin  (LIPITOR) 20 MG tablet TAKE 1 TABLET(20 MG) BY MOUTH DAILY   carvedilol  (COREG ) 6.25 MG tablet TAKE 1 TABLET(6.25 MG) BY MOUTH TWICE DAILY WITH A MEAL   cetirizine  (ZYRTEC ) 10 mg, Oral, Daily   diclofenac  Sodium (VOLTAREN ) 1 % GEL APPLY 4 GRAMS EXTERNALLY TO THE AFFECTED AREA FOUR TIMES DAILY   fluticasone  (FLONASE ) 50 MCG/ACT nasal spray PLACE 1 SPRAY IN BOTH NOSTRILS DAILY   hydrochlorothiazide  (HYDRODIURIL ) 25 mg, Oral, Daily   omeprazole  (PRILOSEC) 20 MG capsule TAKE 1 CAPSULE(20 MG) BY MOUTH DAILY   traMADol  (ULTRAM ) 50 mg, Oral, Every 6 hours PRN    Diet Orders (From admission, onward)     Start     Ordered   08/08/24 1510  Diet regular Fluid consistency: Thin  Diet  effective now       Question:  Fluid consistency:  Answer:  Thin   08/08/24 1510            DVT prophylaxis: enoxaparin  (LOVENOX ) injection 40 mg Start: 08/08/24 1600   Lab Results  Component Value Date   PLT 251 08/09/2024      Code Status: Full Code  Family Communication: No family at bedside  Status  is: Inpatient Remains inpatient appropriate because: Severity of illness   Level of care: Telemetry Surgical  Consultants:  Neurosurgery  Objective: Vitals:   08/09/24 2005 08/09/24 2305 08/10/24 0353 08/10/24 0812  BP: (!) 111/98 (!) 143/83 138/79 (!) 145/94  Pulse: 74 66 64 81  Resp: 16 16 16 16   Temp: 97.7 F (36.5 C) 97.7 F (36.5 C)  97.6 F (36.4 C)  TempSrc: Oral Oral  Oral  SpO2: 99% 100% 100% 96%  Weight:      Height:        Intake/Output Summary (Last 24 hours) at 08/10/2024 1013 Last data filed at 08/10/2024 0355 Gross per 24 hour  Intake --  Output 2175 ml  Net -2175 ml   Wt Readings from Last 3 Encounters:  08/08/24 95.3 kg  08/05/24 95.2 kg  06/08/24 97.1 kg    Examination:  Constitutional: NAD Eyes: lids and conjunctivae normal, no scleral icterus ENMT: mmm Neck: normal, supple Respiratory: clear to auscultation bilaterally, no wheezing, no crackles.  Cardiovascular: Regular rate and rhythm, no murmurs / rubs / gallops. No LE edema. Abdomen: soft, no distention, no tenderness. Bowel sounds positive.    Data Reviewed: I have independently reviewed following labs and imaging studies   CBC Recent Labs  Lab 08/08/24 0842 08/08/24 0848 08/09/24 0707  WBC 4.0  --  3.8*  HGB 13.3 13.9 14.3  HCT 39.6 41.0 42.0  PLT 228  --  251  MCV 89.4  --  88.6  MCH 30.0  --  30.2  MCHC 33.6  --  34.0  RDW 13.3  --  13.2    Recent Labs  Lab 08/08/24 0842 08/08/24 0848 08/08/24 1009 08/09/24 0707  NA 138 140  --  138  K 2.8* 2.8*  --  4.0  CL 100 102  --  99  CO2 26  --   --  24  GLUCOSE 110* 109*  --  129*  BUN 13 16  --  18  CREATININE 0.97 1.00  --  0.97  CALCIUM  9.4  --   --  9.6  AST 25  --   --  19  ALT 22  --   --  18  ALKPHOS 45  --   --  51  BILITOT 1.9*  --   --  2.1*  ALBUMIN 4.1  --   --  3.8  MG  --   --  2.1  --      ------------------------------------------------------------------------------------------------------------------ No results for input(s): CHOL, HDL, LDLCALC, TRIG, CHOLHDL, LDLDIRECT in the last 72 hours.  Lab Results  Component Value Date   HGBA1C 6.0 05/05/2024   ------------------------------------------------------------------------------------------------------------------ No results for input(s): TSH, T4TOTAL, T3FREE, THYROIDAB in the last 72 hours.  Invalid input(s): FREET3  Cardiac Enzymes No results for input(s): CKMB, TROPONINI, MYOGLOBIN in the last 168 hours.  Invalid input(s): CK ------------------------------------------------------------------------------------------------------------------ No results found for: BNP  CBG: Recent Labs  Lab 08/08/24 0843  GLUCAP 104*    No results found for this or any previous visit (from the past 240 hours).  Radiology Studies: MR CERVICAL SPINE W WO CONTRAST Result Date: 08/09/2024 EXAM: MRI CERVICAL SPINE WITH AND WITHOUT CONTRAST 08/09/2024 02:43:55 PM TECHNIQUE: Multiplanar multisequence MRI of the cervical spine was performed without and with the administration of 9 mL of gadobutrol (GADAVIST) 1 MMOL/ML intravenous contrast. COMPARISON: MRI of the thoracic spine 08/08/2024. CLINICAL HISTORY: Metastatic disease evaluation. FINDINGS: BONES AND ALIGNMENT: Straightening of the normal cervical lordosis is present. Normal vertebral body heights. Marrow signal is unremarkable. Chronic type 2 Modic changes are present at C4-C5 and C6-C7. No abnormal enhancement. SPINAL CORD: Intrinsic cord edema extends superiorly to the level of C1-C2. Subtle peripheral enhancement is present within an intramedullary lesion extending from T1-T2 through mid body of T1 measuring 5 x 17 mm. No other pathologic enhancement or cord enlargement is present. SOFT TISSUES: No paraspinal mass. C2-C3: Complex at C2-C3 effaces  the ventral CSF. Severe right and moderate left foraminal stenosis is present. C3-C4: A broad-based disc osteophyte complex and facet hypertrophy contributes to moderate central and right greater than left foraminal narrowing. The central canal is narrowed 8 mm. C4-C5: A broad-based disc osteophyte complex effacement of the ventral CSF at C4-C5. Severe foraminal stenosis is present bilaterally. C5-C6: Broad-based disc osteophyte complex is asymmetric to the left at C5-C6. A central disc protrusion narrows the canal centrally to 7 mm. Severe left and moderate right foraminal stenosis is present. C6-C7: No significant disc herniation. No spinal canal stenosis or neural foraminal narrowing. C7-T1: No significant disc herniation. No spinal canal stenosis or neural foraminal narrowing. IMPRESSION: 1. Intrinsic cord edema extending superiorly to the level of C1-2. 2. Subtle peripheral enhancement within an intramedullary lesion extending from T1-2 through the mid body of T1 measuring 5 x 17 mm. 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 3. Moderate central canal narrowing at C3-4 with canal diameter of approximately 8 mm. 4. Central canal narrowing at C5-6 to approximately 7 mm due to a central disc protrusion. 5. Severe foraminal stenosis bilaterally at C4-5. 6. Severe left and moderate right foraminal stenosis at C5-6. 7. Severe right and moderate left foraminal stenosis at C2-3. Electronically signed by: Lonni Necessary MD 08/09/2024 03:05 PM EDT RP Workstation: [id number]   Nilda Fendt, MD, PhD [facility]  Between 7 am - 7 pm I am available, please contact me via Amion (for emergencies) or Securechat (non urgent messages)  Between 7 pm - 7 am I am not available, please contact night coverage MD/APP via Amion

## 2024-08-11 ENCOUNTER — Inpatient Hospital Stay (HOSPITAL_COMMUNITY)

## 2024-08-11 DIAGNOSIS — D497 Neoplasm of unspecified behavior of endocrine glands and other parts of nervous system: Secondary | ICD-10-CM | POA: Diagnosis not present

## 2024-08-11 LAB — COMPREHENSIVE METABOLIC PANEL WITH GFR
ALT: 16 U/L (ref 0–44)
AST: 22 U/L (ref 15–41)
Albumin: 3.7 g/dL (ref 3.5–5.0)
Alkaline Phosphatase: 54 U/L (ref 38–126)
Anion gap: 12 (ref 5–15)
BUN: 21 mg/dL (ref 8–23)
CO2: 24 mmol/L (ref 22–32)
Calcium: 9.7 mg/dL (ref 8.9–10.3)
Chloride: 100 mmol/L (ref 98–111)
Creatinine, Ser: 1.05 mg/dL (ref 0.61–1.24)
GFR, Estimated: >60 mL/min (ref >60)
Glucose, Bld: 144 mg/dL — ABNORMAL HIGH (ref 70–99)
Potassium: 4.7 mmol/L (ref 3.5–5.1)
Sodium: 136 mmol/L (ref 135–145)
Total Bilirubin: 1 mg/dL (ref 0.0–1.2)
Total Protein: 7.5 g/dL (ref 6.5–8.1)

## 2024-08-11 LAB — CBC
HCT: 43.6 % (ref 39.0–52.0)
Hemoglobin: 14.8 g/dL (ref 13.0–17.0)
MCH: 30.1 pg (ref 26.0–34.0)
MCHC: 33.9 g/dL (ref 30.0–36.0)
MCV: 88.8 fL (ref 80.0–100.0)
Platelets: 277 K/uL (ref 150–400)
RBC: 4.91 MIL/uL (ref 4.22–5.81)
RDW: 13.4 % (ref 11.5–15.5)
WBC: 9.3 K/uL (ref 4.0–10.5)
nRBC: 0 % (ref 0.0–0.2)

## 2024-08-11 LAB — GLUCOSE, CAPILLARY: Glucose-Capillary: 127 mg/dL — ABNORMAL HIGH (ref 70–99)

## 2024-08-11 LAB — MAGNESIUM: Magnesium: 2.4 mg/dL (ref 1.7–2.4)

## 2024-08-11 MED ORDER — LORAZEPAM 1 MG PO TABS
1.0000 mg | ORAL_TABLET | Freq: Once | ORAL | Status: AC | PRN
  Administered 2024-08-11: 1 mg via ORAL
  Filled 2024-08-11: qty 1

## 2024-08-11 MED ORDER — GADOBUTROL 1 MMOL/ML IV SOLN
9.5000 mL | Freq: Once | INTRAVENOUS | Status: AC | PRN
  Administered 2024-08-11: 9.5 mL via INTRAVENOUS

## 2024-08-11 NOTE — Consult Note (Cosign Needed)
 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 Trixie  HISTORY OF PRESENTING ILLNESS:  Dustin [name] 68 y.o. male who was admitted on 08/08/2024 with complaints of balance issues and lower extremity numbness. Workup was done including imaging which showed extensive T-spine cord edema felt to represent cord metastasis or primary spinal cord neoplasm.  Therefore oncology consult has been requested. Patient is seen awake alert and oriented laying in bed.  Very pleasant gentleman who states that a few days prior to admission he began to have lower extremity numbness with throbbing pain that radiated to his butt.  Initially he thought it was a foot problem and went to see his PCP who was referring him to a foot doctor.  He also began to experience significant back pain.  However he woke up and could not walk, then noticed he was having problems having bowel movements.  Therefore decided to come to the ED for further evaluation without going to see the foot doctor. Medical history includes hypertension, hyperlipidemia, hepatitis C, cirrhosis, DVT, and GI carcinoid tumor. Surgical history includes bilateral knee surgeries 5 to 10 years ago. Family history includes mother with breast cancer. Social history is noncontributory.  Denies tobacco use.  Admits to alcohol use which he stopped 20 years ago.  Denies illicit or recreational drug use.  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     I have reviewed his chart and materials related to his cancer extensively and collaborated history with the patient. Summary of oncologic history is as follows: Oncology History   No  history exists.    ASSESSMENT & PLAN:   Spinal cord neoplasm Splenic lesion - Imaging shows enhancement from T1 to mid T2 with extensive surrounding cord edema.  There is also cord edema at C1-2.  May be cord metastases, or primary spinal cord neoplasm.  This may represent ependymoma or spinal cord astrocytoma.   -- There is also a large lesion in the spleen. - On Decadron , continue as ordered - Seen by neurosurgery who are recommending biopsy. - Medical oncology/Dr. Loretha will make further evaluation recommendations.  Lower extremity weakness - Secondary to neoplasm - Continue supportive care  History of hep C, cirrhosis - Treated with Harvoni  -LFTs stable  History of DVT, upper extremity - Eliquis  is on hold pending surgical plans  History of benign neuroendocrine tumor, stomach - Continue outpatient follow-up at [facility]  Hypertension -Monitor BP closely - Continue antihypertensives  MEDICAL HISTORY:  Past Medical History:  Diagnosis Date   Arthritis    bil knees, left hand   Carcinoid tumor (HCC)    DVT of upper extremity (deep vein thrombosis) (HCC)    left brachial vein, 12/2010   Helicobacter pylori gastritis    History of blood clots    to left arm   Hypertension    Shortness of breath dyspnea    with exertion   Tubular adenoma of colon 2017    SURGICAL HISTORY: Past Surgical History:  Procedure Laterality Date   colonscopy      1 year ago    KNEE ARTHROPLASTY Right 04/30/2015   Procedure: COMPUTER ASSISTED TOTAL KNEE ARTHROPLASTY;  Surgeon: Oneil JAYSON Herald, MD;  Location: [facility] OR;  Service: Orthopedics;  Laterality: Right;   Left  Hand   2011   cyst removed.   TOTAL KNEE ARTHROPLASTY  10/19/2011   Procedure: TOTAL KNEE ARTHROPLASTY;  Surgeon: Rome JULIANNA Pepper;  Location: [facility] OR;  Service: Orthopedics;  Laterality: Left;    SOCIAL HISTORY: Social History   Socioeconomic History   Marital status: Single    Spouse name: Not on file   Number of children: Not  on file   Years of education: Not on file   Highest education level: Not on file  Occupational History   Not on file  Tobacco Use   Smoking status: Never   Smokeless tobacco: Never  Substance and Sexual Activity   Alcohol use: No    Alcohol/week: 0.0 standard drinks of alcohol   Drug use: No   Sexual activity: Yes  Other Topics Concern   Not on file  Social History Narrative   Not on file   Social Drivers of Health   Financial Resource Strain: Low Risk  (05/01/2024)   Overall Financial Resource Strain (CARDIA)    Difficulty of Paying Living Expenses: Not hard at all  Food Insecurity: No Food Insecurity (05/01/2024)   Hunger Vital Sign    Worried About Running Out of Food in the Last Year: Never true    Ran Out of Food in the Last Year: Never true  Transportation Needs: No Transportation Needs (05/01/2024)   PRAPARE - Administrator, Civil Service (Medical): No    Lack of Transportation (Non-Medical): No  Physical Activity: Sufficiently Active (05/01/2024)   Exercise Vital Sign    Days of Exercise per Week: 3 days    Minutes of Exercise per Session: 60 min  Stress: No Stress Concern Present (05/01/2024)   [employer] of Occupational Health - Occupational Stress Questionnaire    Feeling of Stress: Not at all  Social Connections: Moderately Isolated (05/01/2024)   Social Connection and Isolation Panel    Frequency of Communication with Friends and Family: Once a week    Frequency of Social Gatherings with Friends and Family: Twice a week    Attends Religious Services: More than 4 times per year    Active Member of [employer] or Organizations: No    Attends Engineer, structural: Not on file    Marital Status: Never married  Intimate Partner Violence: Not At Risk (03/25/2024)   Humiliation, Afraid, Rape, and Kick questionnaire    Fear of Current or Ex-Partner: No    Emotionally Abused: No    Physically Abused: No    Sexually Abused: No    FAMILY  HISTORY: Family History  Problem Relation Age of Onset   Cancer Mother    Cancer Father        prostate cancer   Diabetes Other    Hypertension Other    Diabetes Maternal Uncle    Anesthesia problems Neg Hx    Hypotension Neg Hx    Malignant hyperthermia Neg Hx    Pseudochol deficiency Neg Hx      PHYSICAL EXAMINATION: ECOG PERFORMANCE STATUS: 3 - Symptomatic, >50% confined to bed  Vitals:   08/11/24 0828 08/11/24 1232  BP: 128/76 (!) 144/82  Pulse: 76 71  Resp: 18 16  Temp: 98.2 F (36.8 C) 97.6 F (36.4 C)  SpO2:  99%   Filed Weights   08/08/24 0649  Weight: 210 lb (95.3 kg)    GENERAL: alert, no distress and comfortable SKIN: skin color, texture, turgor are normal, no rashes  or significant lesions EYES: normal, conjunctiva are pink and non-injected, sclera clear OROPHARYNX: no exudate, no erythema and lips, buccal mucosa, and tongue normal  NECK: supple, thyroid  normal size, non-tender, without nodularity LYMPH: no palpable lymphadenopathy in the cervical, axillary or inguinal LUNGS: clear to auscultation and percussion with normal breathing effort HEART: regular rate & rhythm and no murmurs and no lower extremity edema ABDOMEN: abdomen soft, non-tender and normal bowel sounds MUSCULOSKELETAL: no cyanosis of digits and no clubbing  PSYCH: alert & oriented x 3 with fluent speech NEURO: no focal motor/sensory deficits   ALLERGIES:  has no known allergies.  MEDICATIONS:  Current Facility-Administered Medications  Medication Dose Route Frequency Provider Last Rate Last Admin   acetaminophen  (TYLENOL ) tablet 650 mg  650 mg Oral Q6H PRN Seena Marsa NOVAK, MD   650 mg at 08/10/24 2158   Or   acetaminophen  (TYLENOL ) suppository 650 mg  650 mg Rectal Q6H PRN Seena Marsa NOVAK, MD       albuterol  (PROVENTIL ) (2.5 MG/3ML) 0.083% nebulizer solution 2.5 mg  2.5 mg Nebulization Q6H PRN Seena Marsa NOVAK, MD       amLODipine  (NORVASC ) tablet 10 mg  10 mg Oral Daily  Melvin, Alexander B, MD   10 mg at 08/11/24 9180   atorvastatin  (LIPITOR) tablet 20 mg  20 mg Oral Daily Melvin, Alexander B, MD   20 mg at 08/11/24 9180   carvedilol  (COREG ) tablet 6.25 mg  6.25 mg Oral BID WC Melvin, Alexander B, MD   6.25 mg at 08/11/24 9180   Chlorhexidine  Gluconate Cloth 2 % PADS 6 each  6 each Topical Daily Chavez, Abigail, NP   6 each at 08/11/24 0820   dexamethasone  (DECADRON ) tablet 4 mg  4 mg Oral Q6H Garst, Jonathan R, MD   4 mg at 08/11/24 1147   diazepam (VALIUM) tablet 5 mg  5 mg Oral Once PRN Darnella Dorn SAUNDERS, MD       enoxaparin  (LOVENOX ) injection 40 mg  40 mg Subcutaneous Q24H Seena Marsa NOVAK, MD   40 mg at 08/10/24 1500   hydrochlorothiazide  (HYDRODIURIL ) tablet 25 mg  25 mg Oral Daily Melvin, Alexander B, MD   25 mg at 08/11/24 9180   HYDROcodone -acetaminophen  (NORCO/VICODIN) 5-325 MG per tablet 1 tablet  1 tablet Oral Q6H PRN Seena Marsa NOVAK, MD   1 tablet at 08/11/24 1150   methocarbamol  (ROBAXIN ) tablet 750 mg  750 mg Oral Q6H PRN Andrez Chroman, NP   750 mg at 08/11/24 1150   pantoprazole (PROTONIX) EC tablet 40 mg  40 mg Oral Daily Melvin, Alexander B, MD   40 mg at 08/11/24 9180   polyethylene glycol (MIRALAX  / GLYCOLAX ) packet 17 g  17 g Oral Daily PRN Seena Marsa NOVAK, MD   17 g at 08/10/24 2157   sodium chloride  flush (NS) 0.9 % injection 3 mL  3 mL Intravenous Q12H Seena Marsa NOVAK, MD   3 mL at 08/11/24 0820     LABORATORY DATA:  I have reviewed the data as listed Lab Results  Component Value Date   WBC 9.3 08/11/2024   HGB 14.8 08/11/2024   HCT 43.6 08/11/2024   MCV 88.8 08/11/2024   PLT 277 08/11/2024   Recent Labs    04/09/24 0724 05/05/24 0916 08/08/24 0842 08/08/24 0848 08/09/24 0707 08/11/24 0531  NA 135  --  138 140 138 136  K 3.0*   < > 2.8* 2.8* 4.0 4.7  CL 101  --  100  102 99 100  CO2 24  --  26  --  24 24  GLUCOSE 129*  --  110* 109* 129* 144*  BUN 9  --  13 16 18 21   CREATININE 0.87  --  0.97 1.00 0.97  1.05  CALCIUM  9.2  --  9.4  --  9.6 9.7  GFRNONAA >60  --  >60  --  >60 >60  PROT 7.9  --  7.7  --  7.6 7.5  ALBUMIN 4.2  --  4.1  --  3.8 3.7  AST 28  --  25  --  19 22  ALT 20  --  22  --  18 16  ALKPHOS 46  --  45  --  51 54  BILITOT 1.0  --  1.9*  --  2.1* 1.0  BILIDIR 0.1  --   --   --   --   --   IBILI 0.9  --   --   --   --   --    < > = values in this interval not displayed.    RADIOGRAPHIC STUDIES: I have personally reviewed the radiological images as listed and agreed with the findings in the report. MR CERVICAL SPINE W WO CONTRAST Result Date: 08/09/2024 EXAM: MRI CERVICAL SPINE WITH AND WITHOUT CONTRAST 08/09/2024 02:43:55 PM TECHNIQUE: Multiplanar multisequence MRI of the cervical spine was performed without and with the administration of 9 mL of gadobutrol (GADAVIST) 1 MMOL/ML intravenous contrast. COMPARISON: MRI of the thoracic spine 08/08/2024. CLINICAL HISTORY: Metastatic disease evaluation. FINDINGS: BONES AND ALIGNMENT: Straightening of the normal cervical lordosis is present. Normal vertebral body heights. Marrow signal is unremarkable. Chronic type 2 Modic changes are present at C4-C5 and C6-C7. No abnormal enhancement. SPINAL CORD: Intrinsic cord edema extends superiorly to the level of C1-C2. Subtle peripheral enhancement is present within an intramedullary lesion extending from T1-T2 through mid body of T1 measuring 5 x 17 mm. No other pathologic enhancement or cord enlargement is present. SOFT TISSUES: No paraspinal mass. C2-C3: Complex at C2-C3 effaces the ventral CSF. Severe right and moderate left foraminal stenosis is present. C3-C4: A broad-based disc osteophyte complex and facet hypertrophy contributes to moderate central and right greater than left foraminal narrowing. The central canal is narrowed 8 mm. C4-C5: A broad-based disc osteophyte complex effacement of the ventral CSF at C4-C5. Severe foraminal stenosis is present bilaterally. C5-C6: Broad-based disc  osteophyte complex is asymmetric to the left at C5-C6. A central disc protrusion narrows the canal centrally to 7 mm. Severe left and moderate right foraminal stenosis is present. C6-C7: No significant disc herniation. No spinal canal stenosis or neural foraminal narrowing. C7-T1: No significant disc herniation. No spinal canal stenosis or neural foraminal narrowing. IMPRESSION: 1. Intrinsic cord edema extending superiorly to the level of C1-2. 2. Subtle peripheral enhancement within an intramedullary lesion extending from T1-2 through the mid body of T1 measuring 5 x 17 mm. 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 3. Moderate central canal narrowing at C3-4 with canal diameter of approximately 8 mm. 4. Central canal narrowing at C5-6 to approximately 7 mm due to a central disc protrusion. 5. Severe foraminal stenosis bilaterally at C4-5. 6. Severe left and moderate right foraminal stenosis at C5-6. 7. Severe right and moderate left foraminal stenosis at C2-3. Electronically signed by: Lonni Necessary MD 08/09/2024 03:05 PM EDT RP Workstation: [id number]   CT CHEST ABDOMEN PELVIS  W CONTRAST Result Date: 08/08/2024 CLINICAL DATA:  Metastatic disease evaluation. History of spinal cord neoplasm. Weakness. EXAM: CT CHEST, ABDOMEN, AND PELVIS WITH CONTRAST TECHNIQUE: Multidetector CT imaging of the chest, abdomen and pelvis was performed following the standard protocol during bolus administration of intravenous contrast. RADIATION DOSE REDUCTION: This exam was performed according to the departmental dose-optimization program which includes automated exposure control, adjustment of the mA and/or kV according to patient size and/or use of iterative reconstruction technique. CONTRAST:  75mL OMNIPAQUE IOHEXOL 350 MG/ML SOLN COMPARISON:  CT abdomen and pelvis 08/08/2024. Chest radiographs 04/09/2024 FINDINGS: CT CHEST FINDINGS  Cardiovascular: Normal heart size. No pericardial effusions. Normal caliber thoracic aorta. No aortic dissection. Great vessel origins are patent. Central pulmonary arteries are patent without evidence of significant pulmonary embolus. Mediastinum/Nodes: Esophagus is decompressed. No significant lymphadenopathy. Thyroid  gland is unremarkable. Lungs/Pleura: There is a small focal area of consolidation in the right lung base associated with an area of mucous plugging. This could represent focal pneumonia or aspiration. Focal scarring in the anterior left lung base. No significant pulmonary nodules or mass lesions identified. No pleural effusion or pneumothorax. Musculoskeletal: Degenerative changes in the spine. No focal bone lesions. CT ABDOMEN PELVIS FINDINGS Hepatobiliary: Mild diffuse fatty infiltration of the liver. No focal lesions. Gallbladder and bile ducts are normal. Pancreas: Unremarkable. No pancreatic ductal dilatation or surrounding inflammatory changes. Spleen: There is a large mostly homogeneous hyperenhancing lesion centrally in the spleen, measuring 6.5 x 6.5 cm in diameter. Adrenals/Urinary Tract: No adrenal gland nodules. Kidneys are symmetrical. Homogeneous nephrograms. No solid mass lesions. No hydronephrosis or hydroureter. Bladder is normal. Stomach/Bowel: Stomach, small bowel, and colon are not abnormally distended. No wall thickening or inflammatory stranding are appreciated. Appendix is normal. Diverticulosis of the sigmoid colon without evidence of acute diverticulitis. Vascular/Lymphatic: No significant vascular findings are present. No enlarged abdominal or pelvic lymph nodes. Reproductive: Prostate gland is enlarged, measuring 5.6 cm diameter. Other: No free air or free fluid in the abdomen. Abdominal wall musculature appears intact. Musculoskeletal: Degenerative changes in the spine. No focal bone lesions. IMPRESSION: 1. Focal area of infiltration associated with mucous plugging in the  right lung base. This may represent aspiration or pneumonia. 2. No evidence of metastatic disease in the chest. 3. Large hyperenhancing lesion in the spleen. This was not present on the prior MRI abdomen from 07/18/2016. Differential diagnosis would include hemangioma, hamartoma, metastasis, or less likely lymphoma. Consider MRI for further evaluation. 4. No other possible metastatic lesions are identified in the abdomen or pelvis. 5. Prostate gland is enlarged. Electronically Signed   By: Elsie Gravely M.D.   On: 08/08/2024 15:35   MR THORACIC SPINE W WO CONTRAST Result Date: 08/08/2024 EXAM: MRI THORACIC SPINE WITH AND WITHOUT INTRAVENOUS CONTRAST 08/08/2024 12:40:09 PM TECHNIQUE: Multiplanar multisequence MRI of the thoracic spine was performed with and without the administration of intravenous contrast. 9 mL (gadobutrol (GADAVIST) 1 MMOL/ML injection 9 mL GADOBUTROL 1 MMOL/ML IV SOLN) was administered. COMPARISON: None available. CLINICAL HISTORY: Mid-back pain, neuro deficit. FINDINGS: BONES AND ALIGNMENT: Normal alignment. Normal vertebral body heights. An 11 mm hemangioma is present superiorly at T12. Bone marrow signal is otherwise unremarkable. No other abnormal enhancement. SPINAL CORD: Mildly irregular intramedullary enhancement is present in the spinal cord from T1-2 through the mid body of T2 measuring 5 x 13 mm. Extensive cord edema extends superiorly to the highest image level, C6-7 and inferiorly to the level of T7. No other focal enhancement or cord enlargement is present.  SOFT TISSUES: Unremarkable. DEGENERATIVE CHANGES: No significant focal disc protrusion or stenosis is present. No neural foraminal narrowing. IMPRESSION: 1. Mildly irregular intramedullary enhancement from T12 to mid T2 (5 x 13 mm) with extensive surrounding cord edema extending superiorly to C67 and inferiorly to T7. his could represent an intrinsic cord metastasis, primary spinal cord neoplasm is favored. This may  represent an ependymoma or spinal cord astrocytoma. 2. No significant focal disc protrusion or stenosis. Electronically signed by: Lonni Necessary MD 08/08/2024 01:17 PM EDT RP Workstation: [id number]   MR Brain W and Wo Contrast Result Date: 08/08/2024 CLINICAL DATA:  Provided history: Metastatic disease evaluation. EXAM: MRI HEAD WITHOUT AND WITH CONTRAST TECHNIQUE: Multiplanar, multiecho pulse sequences of the brain and surrounding structures were obtained without and with intravenous contrast. CONTRAST:  9mL GADAVIST GADOBUTROL 1 MMOL/ML IV SOLN COMPARISON:  Head CT 08/08/2012. FINDINGS: The axial T1-weighted post-contrast sequence is moderate-to-severely motion degraded. The coronal T1-weighted post-contrast sequence is moderately motion degraded. Within these limitations, findings are as follows. Brain: Mild generalized cerebral atrophy. Multifocal T2 FLAIR hyperintense signal abnormality within the cerebral white matter, nonspecific but compatible with minimal chronic small vessel ischemic disease. No cortical encephalomalacia is identified. There is no acute infarct. No evidence of an intracranial mass. No chronic intracranial blood products. No extra-axial fluid collection. No midline shift. Within the limitations of motion degraded post-contrast imaging, there is no pathologic intracranial enhancement. Vascular: Maintained flow voids within the proximal large arterial vessels. Skull and upper cervical spine: No focal worrisome marrow lesion. Incompletely assessed cervical spondylosis. Partially imaged edema within the cervical spinal cord extending inferiorly from the C2 level. Sinuses/Orbits: No mass or acute finding within the imaged orbits. Moderate mucosal thickening within the right maxillary sinus. 18 mm mucous retention cyst, and mild background mucosal thickening, within the left maxillary sinus. Severe mucosal thickening within the right sphenoid sinus. Small fluid level within the left  sphenoid sinus. Bilateral ethmoid sinusitis (mild right, moderate left). Moderate right frontal sinusitis. IMPRESSION: 1. Motion degraded post-contrast imaging as described. Within this limitation, there is no evidence of intracranial metastatic disease. 2. Minor chronic small vessel ischemic changes within the cerebral white matter. 3. Mild generalized cerebral atrophy. 4. Paranasal sinus disease as outlined within the body of the report. 5. Partially imaged edema within the cervical spinal cord extending inferiorly from the C2 level. Correlate with findings on same-day thoracic spine MRI and consider a dedicated cervical spine MRI (with and without contrast) for further evaluation. Electronically Signed   By: Rockey Childs D.O.   On: 08/08/2024 13:13   MR Lumbar Spine W Wo Contrast Result Date: 08/08/2024 EXAM: MRI LUMBAR SPINE 08/08/2024 12:43:22 PM TECHNIQUE: Multiplanar multisequence MRI of the lumbar spine was performed with and without the administration of 9 mL gadobutrol (GADAVIST) 1 MMOL/ML injection. COMPARISON: None available. CLINICAL HISTORY: Low back pain, cauda equina syndrome suspected. FINDINGS: BONES AND ALIGNMENT: Straightening of the normal lumbar lordosis is present. Normal vertebral body heights. Bone marrow signal: Mixed edematous and chronic type 2 Modic changes are present at L2-L3. A small hemangioma is present at L1-L2. Chronic type 2 Modic changes are present posteriorly and asymmetrically on the right at L5-S1. Slight retrolisthesis at L5-S1 is stable. SPINAL CORD: The conus medullaris terminates at L1. SOFT TISSUES: No paraspinal mass. L1-L2: A small hemangioma is present at L1-L2. No significant disc herniation. No spinal canal stenosis or neural foraminal narrowing. L2-L3: Mixed edematous and chronic type 2 Modic changes are present at L2-L3. A broad-based  disc protrusion and chronic loss of disc height is present at L2-L3. Moderate facet hypertrophy contributes to moderate  foraminal narrowing bilaterally. L3-L4: Broad-based disc protrusion and moderate bilateral facet hypertrophy results in moderate right and mild left subarticular narrowing at L3-L4. Moderate foraminal stenosis is worse on the right. L4-L5: A broad-based disc protrusion is asymmetric to the left at L4-L5. Moderate facet hypertrophy is present. Moderate left-to-mild right subarticular narrowing is present. Moderate foraminal narrowing is present bilaterally. L5-S1: Slight retrolisthesis at L5-S1 is stable. Chronic type 2 Modic changes are present posteriorly and asymmetrically on the right at L5-S1. Facet hypertrophy contributes to moderate foraminal narrowing bilaterally. No significant disc herniation. No spinal canal stenosis. IMPRESSION: 1. No evidence of cauda equina syndrome. 2. Multilevel degenerative changes with moderate foraminal stenosis at L2-3 and L3-4, and moderate foraminal narrowing at L4-5 and L5-S1. Electronically signed by: Lonni Necessary MD 08/08/2024 01:08 PM EDT RP Workstation: [id number]   CT ABDOMEN PELVIS WO CONTRAST Result Date: 08/08/2024 CLINICAL DATA:  Anuria. EXAM: CT ABDOMEN AND PELVIS WITHOUT CONTRAST TECHNIQUE: Multidetector CT imaging of the abdomen and pelvis was performed following the standard protocol without IV contrast. RADIATION DOSE REDUCTION: This exam was performed according to the departmental dose-optimization program which includes automated exposure control, adjustment of the mA and/or kV according to patient size and/or use of iterative reconstruction technique. COMPARISON:  July 08, 2016. FINDINGS: Lower chest: No acute abnormality. Hepatobiliary: No focal liver abnormality is seen. No gallstones, gallbladder wall thickening, or biliary dilatation. Pancreas: Unremarkable. No pancreatic ductal dilatation or surrounding inflammatory changes. Spleen: Normal in size without focal abnormality. Adrenals/Urinary Tract: Adrenal glands and kidneys are unremarkable.  No hydronephrosis or renal obstruction is noted. No renal or ureteral calculi are noted. Mild urinary bladder distention is noted. Stomach/Bowel: Stomach is within normal limits. Appendix appears normal. No evidence of bowel wall thickening, distention, or inflammatory changes. Moderate amount of stool seen throughout the colon. Vascular/Lymphatic: No significant vascular findings are present. No enlarged abdominal or pelvic lymph nodes. Reproductive: Mild prostatic enlargement is noted. Other: No abdominal wall hernia or abnormality. No abdominopelvic ascites. Musculoskeletal: No acute or significant osseous findings. IMPRESSION: 1. Mild urinary bladder distention is noted. 2. Mild prostatic enlargement. 3. Moderate stool burden. Electronically Signed   By: Lynwood Landy Raddle M.D.   On: 08/08/2024 08:15     The total time spent in the appointment was 40 minutes encounter with patients including review of chart and various tests results, discussions about plan of care and coordination of care plan   All questions were answered. The patient knows to call the clinic with any problems, questions or concerns. No barriers to learning was detected.  Olam PARAS Idamae Coccia, NP 10/13/20251:01 PM

## 2024-08-11 NOTE — Plan of Care (Signed)

## 2024-08-11 NOTE — Care Management Important Message (Signed)
 Important Message  Patient Details  Name: Dustin [name] MRN: [id number] Date of Birth: September 30, 1956   Important Message Given:  Yes - Medicare IM     Jon Cruel 08/11/2024, 4:41 PM

## 2024-08-11 NOTE — Plan of Care (Signed)
  Problem: Education: Goal: Knowledge of General Education information will improve Description: Including pain rating scale, medication(s)/side effects and non-pharmacologic comfort measures Outcome: Progressing   Problem: Health Behavior/Discharge Planning: Goal: Ability to manage health-related needs will improve Outcome: Progressing   Problem: Clinical Measurements: Goal: Ability to maintain clinical measurements within normal limits will improve Outcome: Progressing Goal: Will remain free from infection Outcome: Progressing Goal: Diagnostic test results will improve Outcome: Progressing Goal: Respiratory complications will improve Outcome: Progressing Goal: Cardiovascular complication will be avoided Outcome: Progressing   Problem: Nutrition: Goal: Adequate nutrition will be maintained Outcome: Progressing   Problem: Elimination: Goal: Will not experience complications related to bowel motility Outcome: Progressing Goal: Will not experience complications related to urinary retention Outcome: Progressing   Problem: Pain Managment: Goal: General experience of comfort will improve and/or be controlled Outcome: Progressing   Problem: Safety: Goal: Ability to remain free from injury will improve Outcome: Progressing

## 2024-08-11 NOTE — Progress Notes (Signed)
 PROGRESS NOTE  Dustin [name] FMW:[id number] DOB: [birth year] DOA: 08/08/2024 PCP: Delbert Clam, MD   LOS: 3 days   Brief Narrative / Interim history: 68 year old male with HTN, HLD, hep C, liver cirrhosis, prior DVT, GI carcinoid tumor who comes into the hospital with left leg tingling and balance issues for the past week.  He also reported difficulties with ambulation as well as difficulties urinating.  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  Appreciate neurosurgery follow-up - Has been started on dexamethasone , continue - CT scan of the chest abdomen pelvis showed a nonspecific hyperenhancing lesion in the spleen, query metastasis versus others.  Asked IR team about potential biopsy to her splenic lesion, they would like an MRI to better characterize and see if biopsy is feasible.  MRI ordered and pending  Active problems Essential hypertension-blood pressure reviewed and acceptable today, continue amlodipine , Coreg , hydrochlorothiazide   Hyperlipidemia-continue statin  History of hep C, cirrhosis-treated with Harvoni .  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 Was sent to [facility] for consideration of surgical intervention but no intervention was  recommended per notes   Scheduled Meds:  amLODipine   10 mg Oral Daily   atorvastatin   20 mg Oral Daily   carvedilol   6.25 mg Oral BID WC   Chlorhexidine  Gluconate Cloth  6 each Topical Daily   dexamethasone   4 mg Oral Q6H   enoxaparin  (LOVENOX ) injection  40 mg Subcutaneous Q24H   hydrochlorothiazide   25 mg Oral Daily   pantoprazole  40 mg Oral Daily   sodium chloride  flush  3 mL Intravenous Q12H   Continuous Infusions:  potassium chloride      PRN Meds:.acetaminophen  **OR** acetaminophen , albuterol , diazepam, HYDROcodone -acetaminophen , LORazepam, methocarbamol , polyethylene glycol  Current Outpatient Medications  Medication Instructions   acetaminophen  (TYLENOL ) 500 mg, Oral, Every 6 hours PRN   acetaminophen -codeine  (TYLENOL  #3) 300-30 MG tablet 1 tablet, Oral, Every 6 hours PRN   albuterol  (VENTOLIN  HFA) 108 (90 Base) MCG/ACT inhaler 2 puffs, Inhalation, Every 6 hours PRN   amLODipine  (NORVASC ) 10 mg, Oral, Daily   apixaban  (ELIQUIS ) 5 mg, Oral, 2 times daily   atorvastatin  (LIPITOR) 20 MG tablet TAKE 1 TABLET(20 MG) BY MOUTH DAILY   carvedilol  (COREG ) 6.25 MG tablet TAKE 1 TABLET(6.25 MG) BY MOUTH TWICE DAILY WITH A MEAL   cetirizine  (ZYRTEC ) 10 mg, Oral, Daily   diclofenac  Sodium (VOLTAREN ) 1 % GEL APPLY 4 GRAMS EXTERNALLY TO THE AFFECTED AREA FOUR TIMES DAILY   fluticasone  (FLONASE ) 50 MCG/ACT nasal spray PLACE 1 SPRAY IN BOTH NOSTRILS DAILY   hydrochlorothiazide  (HYDRODIURIL ) 25 mg, Oral, Daily   omeprazole  (PRILOSEC) 20 MG capsule TAKE 1 CAPSULE(20 MG) BY MOUTH DAILY   traMADol  (ULTRAM ) 50 mg, Oral, Every 6 hours PRN    Diet Orders (From admission, onward)     Start     Ordered  08/08/24 1510  Diet regular Fluid consistency: Thin  Diet effective now       Question:  Fluid consistency:  Answer:  Thin   08/08/24 1510            DVT prophylaxis: enoxaparin  (LOVENOX ) injection 40 mg Start: 08/08/24 1600   Lab Results  Component Value Date   PLT 277 08/11/2024       Code Status: Full Code  Family Communication: No family at bedside  Status is: Inpatient Remains inpatient appropriate because: Severity of illness   Level of care: Telemetry Surgical  Consultants:  Neurosurgery  Objective: Vitals:   08/10/24 1955 08/11/24 0010 08/11/24 0326 08/11/24 0828  BP: 135/71 139/72 (!) 145/85   Pulse: 73 74 72 76  Resp:   16 18  Temp: 97.9 F (36.6 C) 97.6 F (36.4 C) 97.6 F (36.4 C) 98.2 F (36.8 C)  TempSrc: Oral Oral Oral Oral  SpO2: 97% 98% 98%   Weight:      Height:        Intake/Output Summary (Last 24 hours) at 08/11/2024 1046 Last data filed at 08/11/2024 0544 Gross per 24 hour  Intake --  Output 1800 ml  Net -1800 ml   Wt Readings from Last 3 Encounters:  08/08/24 95.3 kg  08/05/24 95.2 kg  06/08/24 97.1 kg    Examination:  Constitutional: NAD Eyes: lids and conjunctivae normal, no scleral icterus ENMT: mmm Neck: normal, supple Respiratory: clear to auscultation bilaterally, no wheezing, no crackles.  Cardiovascular: Regular rate and rhythm, no murmurs / rubs / gallops. No LE edema. Abdomen: soft, no distention, no tenderness. Bowel sounds positive.    Data Reviewed: I have independently reviewed following labs and imaging studies   CBC Recent Labs  Lab 08/08/24 0842 08/08/24 0848 08/09/24 0707 08/11/24 0531  WBC 4.0  --  3.8* 9.3  HGB 13.3 13.9 14.3 14.8  HCT 39.6 41.0 42.0 43.6  PLT 228  --  251 277  MCV 89.4  --  88.6 88.8  MCH 30.0  --  30.2 30.1  MCHC 33.6  --  34.0 33.9  RDW 13.3  --  13.2 13.4    Recent Labs  Lab 08/08/24 0842 08/08/24 0848 08/08/24 1009 08/09/24 0707 08/11/24 0531  NA 138 140  --  138 136  K 2.8* 2.8*  --  4.0 4.7  CL 100 102  --  99 100  CO2 26  --   --  24 24  GLUCOSE 110* 109*  --  129* 144*  BUN 13 16  --  18 21  CREATININE 0.97 1.00  --  0.97 1.05  CALCIUM  9.4  --   --  9.6 9.7  AST 25  --   --  19 22  ALT 22  --   --  18 16  ALKPHOS 45  --   --  51 54   BILITOT 1.9*  --   --  2.1* 1.0  ALBUMIN 4.1  --   --  3.8 3.7  MG  --   --  2.1  --  2.4    ------------------------------------------------------------------------------------------------------------------ No results for input(s): CHOL, HDL, LDLCALC, TRIG, CHOLHDL, LDLDIRECT in the last 72 hours.  Lab Results  Component Value Date   HGBA1C 6.0 05/05/2024   ------------------------------------------------------------------------------------------------------------------ No results for input(s): TSH, T4TOTAL, T3FREE, THYROIDAB in the last 72 hours.  Invalid input(s): FREET3  Cardiac Enzymes No results for input(s): CKMB, TROPONINI, MYOGLOBIN in the last 168 hours.  Invalid input(s): CK ------------------------------------------------------------------------------------------------------------------ No results found for: BNP  CBG: Recent Labs  Lab 08/08/24 0843 08/11/24 0010  GLUCAP 104* 127*    No results found for this or any previous visit (from the past 240 hours).   Radiology Studies: No results found.  Nilda Fendt, MD, PhD [facility]  Between 7 am - 7 pm I am available, please contact me via Amion (for emergencies) or Securechat (non urgent messages)  Between 7 pm - 7 am I am not available, please contact night coverage MD/APP via Amion

## 2024-08-12 ENCOUNTER — Other Ambulatory Visit: Payer: Self-pay | Admitting: Radiation Therapy

## 2024-08-12 ENCOUNTER — Other Ambulatory Visit: Payer: Self-pay

## 2024-08-12 DIAGNOSIS — D497 Neoplasm of unspecified behavior of endocrine glands and other parts of nervous system: Secondary | ICD-10-CM | POA: Diagnosis not present

## 2024-08-12 LAB — LACTATE DEHYDROGENASE: LDH: 180 U/L (ref 98–192)

## 2024-08-12 NOTE — Plan of Care (Signed)
 Request to IR for possible splenic lesion biopsy based on results from CT chest/abd/pelvis w/contrast 08/08/24. MRI abdomen was requested for further evaluation of splenic lesion which was completed yesterday evening and read this afternoon.  Per interpreting radiologist (Dr. Ramond):  1. The splenic lesion is very faintly retrospectively detectable on the 07/18/2016 exam but has enlarged, currently averaging about 5.5 cm in diameter and previously about 3.7 cm on the exam from 8 years ago. 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 Periodic surveillance may be warranted as splenic hamartomas can cause symptoms if they grow large. 2. Sigmoid colon diverticulosis. 3. Lumbar spondylosis and degenerative disc disease. 4. Scarring or atelectasis in the right lower lobe.  MRI abdomen reviewed by IR attending (Dr. 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 May consider periodic imaging surveillance as an outpatient to monitor size. No other areas amenable to biopsy by IR.  No plans for IR procedure currently, however IR remains available as needed - please call with questions or concerns.  Clotilda Hesselbach, PA-C

## 2024-08-12 NOTE — Progress Notes (Signed)
 PROGRESS NOTE  Dustin [name] FMW:[id number] DOB: Apr 26, 1956 DOA: 08/08/2024 PCP: Delbert Clam, MD   LOS: 4 days   Brief Narrative / Interim history: 68 year old male with HTN, HLD, hep C, liver cirrhosis, prior DVT, GI carcinoid tumor who comes into the hospital with left leg tingling and balance issues for the past week.  He also reported difficulties with ambulation as well as difficulties urinating.  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  Appreciate neurosurgery follow-up - Has been started on dexamethasone , continue - CT scan of the chest abdomen pelvis showed a nonspecific hyperenhancing lesion in the spleen, query metastasis versus others.  Asked IR team about potential biopsy to her splenic lesion, they would like an MRI to better characterize and see if biopsy is feasible.  MRI done, read is pending, also IR input whether this could be biopsied is pending as well - Oncology engaged yesterday, appreciate follow-up  Active problems Essential hypertension-blood pressure reviewed and acceptable today, continue amlodipine , Coreg , hydrochlorothiazide   Hyperlipidemia-continue statin  History of hep C, cirrhosis-treated with Harvoni .  LFTs stable  History of DVT-hold Eliquis  pending imaging/neurosurgical plans  History of benign  neuroendocrine tumor of the stomach-managed as an outpatient. Was sent to [facility] for consideration of surgical intervention but no intervention was recommended per notes   Scheduled Meds:  amLODipine   10 mg Oral Daily   atorvastatin   20 mg Oral Daily   carvedilol   6.25 mg Oral BID WC   Chlorhexidine  Gluconate Cloth  6 each Topical Daily   dexamethasone   4 mg Oral Q6H   enoxaparin  (LOVENOX ) injection  40 mg Subcutaneous Q24H   hydrochlorothiazide   25 mg Oral Daily   pantoprazole  40 mg Oral Daily   sodium chloride  flush  3 mL Intravenous Q12H   Continuous Infusions:   PRN Meds:.acetaminophen  **OR** acetaminophen , albuterol , HYDROcodone -acetaminophen , methocarbamol , polyethylene glycol  Current Outpatient Medications  Medication Instructions   acetaminophen  (TYLENOL ) 500 mg, Oral, Every 6 hours PRN   acetaminophen -codeine  (TYLENOL  #3) 300-30 MG tablet 1 tablet, Oral, Every 6 hours PRN   albuterol  (VENTOLIN  HFA) 108 (90 Base) MCG/ACT inhaler 2 puffs, Inhalation, Every 6 hours PRN   amLODipine  (NORVASC ) 10 mg, Oral, Daily   apixaban  (ELIQUIS ) 5 mg, Oral, 2 times daily   atorvastatin  (LIPITOR) 20 MG tablet TAKE 1 TABLET(20 MG) BY MOUTH DAILY   carvedilol  (COREG ) 6.25 MG tablet TAKE 1 TABLET(6.25 MG) BY MOUTH TWICE DAILY WITH A MEAL   cetirizine  (ZYRTEC ) 10 mg, Oral, Daily   diclofenac  Sodium (VOLTAREN ) 1 % GEL APPLY 4 GRAMS EXTERNALLY TO THE AFFECTED AREA FOUR TIMES DAILY   fluticasone  (FLONASE ) 50 MCG/ACT nasal spray PLACE 1 SPRAY IN BOTH NOSTRILS DAILY   hydrochlorothiazide  (HYDRODIURIL ) 25 mg, Oral, Daily   omeprazole  (PRILOSEC) 20 MG capsule TAKE 1 CAPSULE(20 MG) BY MOUTH DAILY   traMADol  (ULTRAM ) 50 mg, Oral, Every 6 hours PRN  Diet Orders (From admission, onward)     Start     Ordered   08/08/24 1510  Diet regular Fluid consistency: Thin  Diet effective now       Question:  Fluid consistency:  Answer:  Thin   08/08/24 1510            DVT prophylaxis: enoxaparin   (LOVENOX ) injection 40 mg Start: 08/08/24 1600   Lab Results  Component Value Date   PLT 277 08/11/2024      Code Status: Full Code  Family Communication: Fianc present at bedside  Status is: Inpatient Remains inpatient appropriate because: Severity of illness   Level of care: Telemetry Surgical  Consultants:  Neurosurgery  Objective: Vitals:   08/11/24 1800 08/11/24 1955 08/11/24 2330 08/12/24 0324  BP: (!) 140/71 129/84 125/83 (!) 149/87  Pulse: 71 80 85 60  Resp: 16 16 16 18   Temp: 97.7 F (36.5 C) 97.7 F (36.5 C) 97.7 F (36.5 C) 97.6 F (36.4 C)  TempSrc: Oral Oral Oral Oral  SpO2: 96% 97% 98% 97%  Weight:      Height:        Intake/Output Summary (Last 24 hours) at 08/12/2024 1021 Last data filed at 08/12/2024 1020 Gross per 24 hour  Intake 10 ml  Output 1725 ml  Net -1715 ml   Wt Readings from Last 3 Encounters:  08/08/24 95.3 kg  08/05/24 95.2 kg  06/08/24 97.1 kg    Examination:  Constitutional: NAD Eyes: lids and conjunctivae normal, no scleral icterus ENMT: mmm Neck: normal, supple Respiratory: clear to auscultation bilaterally, no wheezing, no crackles. Cardiovascular: Regular rate and rhythm, no murmurs / rubs / gallops. No LE edema. Abdomen: soft, no distention, no tenderness. Bowel sounds positive.    Data Reviewed: I have independently reviewed following labs and imaging studies   CBC Recent Labs  Lab 08/08/24 0842 08/08/24 0848 08/09/24 0707 08/11/24 0531  WBC 4.0  --  3.8* 9.3  HGB 13.3 13.9 14.3 14.8  HCT 39.6 41.0 42.0 43.6  PLT 228  --  251 277  MCV 89.4  --  88.6 88.8  MCH 30.0  --  30.2 30.1  MCHC 33.6  --  34.0 33.9  RDW 13.3  --  13.2 13.4    Recent Labs  Lab 08/08/24 0842 08/08/24 0848 08/08/24 1009 08/09/24 0707 08/11/24 0531  NA 138 140  --  138 136  K 2.8* 2.8*  --  4.0 4.7  CL 100 102  --  99 100  CO2 26  --   --  24 24  GLUCOSE 110* 109*  --  129* 144*  BUN 13 16  --  18 21  CREATININE 0.97  1.00  --  0.97 1.05  CALCIUM  9.4  --   --  9.6 9.7  AST 25  --   --  19 22  ALT 22  --   --  18 16  ALKPHOS 45  --   --  51 54  BILITOT 1.9*  --   --  2.1* 1.0  ALBUMIN 4.1  --   --  3.8 3.7  MG  --   --  2.1  --  2.4    ------------------------------------------------------------------------------------------------------------------ No results for input(s): CHOL, HDL, LDLCALC, TRIG, CHOLHDL, LDLDIRECT in the last 72 hours.  Lab Results  Component Value Date   HGBA1C 6.0 05/05/2024   ------------------------------------------------------------------------------------------------------------------ No results for input(s): TSH, T4TOTAL, T3FREE, THYROIDAB in the last 72 hours.  Invalid  input(s): FREET3  Cardiac Enzymes No results for input(s): CKMB, TROPONINI, MYOGLOBIN in the last 168 hours.  Invalid input(s): CK ------------------------------------------------------------------------------------------------------------------ No results found for: BNP  CBG: Recent Labs  Lab 08/08/24 0843 08/11/24 0010  GLUCAP 104* 127*    No results found for this or any previous visit (from the past 240 hours).   Radiology Studies: No results found.  Nilda Fendt, MD, PhD [facility]  Between 7 am - 7 pm I am available, please contact me via Amion (for emergencies) or Securechat (non urgent messages)  Between 7 pm - 7 am I am not available, please contact night coverage MD/APP via Amion

## 2024-08-12 NOTE — Progress Notes (Signed)
 This patient was a source for a blood borne pathogen exposure. Labs were drawn and a safe zone was completed. Patient made aware.

## 2024-08-12 NOTE — Progress Notes (Signed)
 Assessment 68 y/o M, hx DVT on Eliquis , cirrhosis who presents with progressive thoracic myelopathy over 1 week and imaging demonstrating cervicothoracic intramedullary edema and T2 intramedullary lesion. Ddx includes metastasis, cavernoma, hemangioblastoma, astrocytoma, ependymoma, lymphoma, etc. Extraspinal workup did not reveal any obvious source  LOS: 5 days    Plan: Decadron  4q6 Plan for surgical resection of spinal cord tumor on 10/16 NPO@[place]    Subjective: Nae o/n. Having increased pain in his upper throacic region   Objective: Vital signs in last 24 hours: Temp:  [97.5 F (36.4 C)-98.1 F (36.7 C)] 97.5 F (36.4 C) (10/15 0332) Pulse Rate:  [65-90] 65 (10/15 0332) Resp:  [12-16] 13 (10/15 0332) BP: (120-153)/(70-78) 120/70 (10/15 0332) SpO2:  [93 %-97 %] 95 % (10/15 0332)  Intake/Output from previous day: 10/14 0701 - 10/15 0700 In: 10 [I.V.:10] Out: 1575 [Urine:1575] Intake/Output this shift: No intake/output data recorded.  Exam: Awake, alert BUE full strength and sensation LLE: subjective numbness from the inguinal ligament distally. 1/5 hip flexion. 3/5 quad, ham. 1/5 DF/EHL/PF RLE: subjective numbness from the knee distally. Full strength  Lab Results: Recent Labs    08/11/24 0531 08/13/24 0246  WBC 9.3 10.7*  HGB 14.8 15.7  HCT 43.6 46.3  PLT 277 307   BMET Recent Labs    08/11/24 0531 08/13/24 0246  NA 136 133*  K 4.7 4.1  CL 100 96*  CO2 24 27  GLUCOSE 144* 157*  BUN 21 27*  CREATININE 1.05 1.00  CALCIUM  9.7 9.3        Dustin [name] 08/13/2024, 7:54 AM

## 2024-08-12 NOTE — Progress Notes (Addendum)
 Assessment 68 y/o M, hx DVT on Eliquis , cirrhosis who presents with progressive thoracic myelopathy over 1 week and imaging demonstrating cervicothoracic intramedullary edema and T2 intramedullary lesion. Ddx includes metastasis, cavernoma, hemangioblastoma, astrocytoma, ependymoma, lymphoma, etc. Extraspinal workup did not reveal any obvious source  LOS: 4 days    Plan: Decadron  4q6 Plan for surgical resection of spinal cord tumor on 10/16 Ok for DVT chemoppx. 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 Biopsy not advised per IR. I discussed this with my neurosurgical colleagues who feel that surgical resection of the spinal cord tumor would be appropriate. I discussed this with the patient including the risks of bleeding, CSF leak, infection, cardiorespiratory risk, mortality, damage to nearby organs. I also emphasized the risk of temporary and/or permanent neurologic damage. He verbalized understanding  Despite the steroids, in the past 2 days the patient's leg function has actually worsened. The right leg is now numb from the knee down. He also has weakness of his left leg  Last dose of Eliquis  was 10/10   Objective: Vital signs in last 24 hours: Temp:  [97.6 F (36.4 C)-97.7 F (36.5 C)] 97.6 F (36.4 C) (10/14 0324) Pulse Rate:  [60-85] 60 (10/14 0324) Resp:  [16-18] 18 (10/14 0324) BP: (125-149)/(71-87) 149/87 (10/14 0324) SpO2:  [96 %-98 %] 97 % (10/14 0324)  Intake/Output from previous day: 10/13 0701 - 10/14 0700 In: -  Out: 1000 [Urine:1000] Intake/Output this shift: Total I/O In: 10 [I.V.:10] Out: 725 [Urine:725]  Exam: Awake, alert BUE full strength and sensation LLE: subjective numbness from the inguinal ligament distally. 2/5 hip flexion. 3/5 quad, ham. 1/5 DF/EHL/PF RLE: subjective numbness from the knee distally. Full strength  Lab Results: Recent Labs     08/11/24 0531  WBC 9.3  HGB 14.8  HCT 43.6  PLT 277   BMET Recent Labs    08/11/24 0531  NA 136  K 4.7  CL 100  CO2 24  GLUCOSE 144*  BUN 21  CREATININE 1.05  CALCIUM  9.7        [name] 08/12/2024, 4:00 PM

## 2024-08-12 NOTE — Progress Notes (Signed)
 Inpatient Rehab Admissions Coordinator:   Per therapy recommendation,  patient was screened for CIR candidacy by Leita Kleine, MS, CCC-SLP  At this time, Pt. is not medically ready for CIR, with biopsy, potential surgery and oncologic treatment all pending.. I will not pursue a rehab consult for this Pt. at this time, but CIR admissions team will follow and monitor for medical readiness and place consult order if Pt. appears to be an appropriate candidate. Please contact me with any questions.   Leita Kleine, MS, CCC-SLP Rehab Admissions Coordinator  [phone] (celll) [phone] (office)

## 2024-08-12 NOTE — Evaluation (Signed)
 Physical Therapy Evaluation Patient Details Name: Dustin [name] MRN: [id number] DOB: Nov 15, 1955 Today's Date: 08/12/2024  History of Present Illness  68 yo male presents to [facility] on 10/10 with progressive LE weakness, inability to ambulate, and bowel/bladder changes. 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 CT abd/pelvis shows splenic lesion. fall 10/11, assisted to floor by staff and no injuries sustained. PMH includes HTN, HLD, hep C, liver cirrhosis, prior DVT, GI carcinoid tumor, remote history of ETOH use.  Clinical Impression   Pt presents with LE weakness L>R, impaired LE sensation bilat, poor truncal control in sitting and standing, and decreased activity tolerance vs baseline. Pt to benefit from acute PT to address deficits. Pt requiring mod assist for transition into standing, unable to progress to dynamic standing tasks given LE buckling and heavy lateral sway suspect due to LE weakness and impaired light touch/proprioception. Pt is independent at baseline, Patient will benefit from intensive inpatient follow-up therapy, >3 hours/day. Pt's fiance works, but per pt she can take time off of work to assist and he has other family/friends in the area that could help at d/c. PT to progress mobility as tolerated, and will continue to follow acutely.          If plan is discharge home, recommend the following: A lot of help with walking and/or transfers;A lot of help with bathing/dressing/bathroom;Help with stairs or ramp for entrance   Can travel by private vehicle        Equipment Recommendations Other (comment) (tbd)  Recommendations for Other Services       Functional Status Assessment Patient has had a recent decline in their functional status and demonstrates the ability to make significant improvements in function in a reasonable and predictable amount of  time.     Precautions / Restrictions Precautions Precautions: Fall Restrictions Weight Bearing Restrictions Per Provider Order: No      Mobility  Bed Mobility Overal bed mobility: Needs Assistance Bed Mobility: Supine to Sit     Supine to sit: Min assist, HOB elevated, Used rails     General bed mobility comments: assist for trunk elevation, LE progression to EOB, scooting to EOB with bed pad.    Transfers Overall transfer level: Needs assistance Equipment used: Rolling walker (2 wheels), Ambulation equipment used Transfers: Sit to/from Stand, Bed to chair/wheelchair/BSC Sit to Stand: Mod assist, From elevated surface           General transfer comment: mod assist for power up, rise, steady. Heavy L/R staggering and unable to step with LE buckling L>R noted. stand x2, with RW first time and stedy for second for transfer OOB to recliner. Transfer via Lift Equipment: Stedy  Ambulation/Gait               General Gait Details: nt  Teacher, music Rankin (Stroke Patients Only)       Balance Overall balance assessment: Needs assistance Sitting-balance support: No upper extremity supported, Feet supported Sitting balance-Leahy Scale: Fair Sitting balance - Comments: able to sit EOB with intermittent UE support on bedrails   Standing balance support: Bilateral upper extremity supported, During functional activity Standing balance-Leahy Scale: Poor Standing balance comment: reliant on external support statically, unable to progress to dynamic standing tasks  Pertinent Vitals/Pain Pain Assessment Pain Assessment: Faces Faces Pain Scale: Hurts even more Pain Location: L flank Pain Descriptors / Indicators: Sore, Discomfort, Grimacing, Guarding Pain Intervention(s): Limited activity within patient's tolerance, Monitored during session, Repositioned, RN gave pain  meds during session    Home Living Family/patient expects to be discharged to:: Private residence Living Arrangements: Spouse/significant other Available Help at Discharge: Family Type of Home: Apartment Home Access: Level entry       Home Layout: One level Home Equipment: Cane - single point Additional Comments: very old RW in his outbuilding closet, needs a new one    Prior Function Prior Level of Function : Independent/Modified Independent;History of Falls (last six months)             Mobility Comments: prior to 1 week ago, pt independent with ADLs and mobility. Progressive weakness x1 week PTA with inability to walk       Extremity/Trunk Assessment   Upper Extremity Assessment Upper Extremity Assessment: Defer to OT evaluation    Lower Extremity Assessment Lower Extremity Assessment: LLE deficits/detail;RLE deficits/detail RLE Deficits / Details: 3/5 grossly knee extension/flexion, hip flexion, DF/PF RLE Sensation: decreased light touch;decreased proprioception RLE Coordination: decreased gross motor;decreased fine motor (incoordination noted during LAQ) LLE Deficits / Details: 2-2+/5 grossly knee extension/flexion, hip flexion, DF/PF LLE Sensation: decreased light touch;decreased proprioception (tingling distally)    Cervical / Trunk Assessment Cervical / Trunk Assessment: Kyphotic  Communication   Communication Communication: No apparent difficulties    Cognition Arousal: Alert Behavior During Therapy: WFL for tasks assessed/performed   PT - Cognitive impairments: Safety/Judgement, Problem solving                       PT - Cognition Comments: at times requires repeated cues and safety cues         Cueing Cueing Techniques: Gestural cues, Verbal cues     General Comments      Exercises General Exercises - Lower Extremity Short Arc Quad: AROM, Both, 10 reps, Seated   Assessment/Plan    PT Assessment Patient needs continued PT  services  PT Problem List Decreased strength;Decreased mobility;Decreased activity tolerance;Decreased balance;Decreased knowledge of use of DME;Pain;Decreased safety awareness       PT Treatment Interventions DME instruction;Therapeutic activities;Gait training;Therapeutic exercise;Patient/family education;Balance training;Stair training;Functional mobility training;Neuromuscular re-education    PT Goals (Current goals can be found in the Care Plan section)  Acute Rehab PT Goals PT Goal Formulation: With patient Time For Goal Achievement: 08/26/24 Potential to Achieve Goals: Good    Frequency Min 2X/week     Co-evaluation               AM-PAC PT 6 Clicks Mobility  Outcome Measure Help needed turning from your back to your side while in a flat bed without using bedrails?: A Little Help needed moving from lying on your back to sitting on the side of a flat bed without using bedrails?: A Lot Help needed moving to and from a bed to a chair (including a wheelchair)?: A Lot Help needed standing up from a chair using your arms (e.g., wheelchair or bedside chair)?: A Lot Help needed to walk in hospital room?: Total Help needed climbing 3-5 steps with a railing? : Total 6 Click Score: 11    End of Session Equipment Utilized During Treatment: Gait belt Activity Tolerance: Patient tolerated treatment well;Patient limited by fatigue Patient left: in chair;with call bell/phone within reach;with chair alarm set;with family/visitor present Nurse Communication:  Mobility status;Need for lift equipment (recommend use stedy) PT Visit Diagnosis: Other abnormalities of gait and mobility (R26.89);Muscle weakness (generalized) (M62.81)    Time: 0930-1001 PT Time Calculation (min) (ACUTE ONLY): 31 min   Charges:   PT Evaluation $PT Eval Low Complexity: 1 Low PT Treatments $Therapeutic Activity: 8-22 mins PT General Charges $$ ACUTE PT VISIT: 1 Visit         [name], PT DPT Acute  Rehabilitation Services Secure Chat Preferred  Office [phone]   [place] Evett E Stroup 08/12/2024, 11:30 AM

## 2024-08-13 DIAGNOSIS — D497 Neoplasm of unspecified behavior of endocrine glands and other parts of nervous system: Secondary | ICD-10-CM | POA: Diagnosis not present

## 2024-08-13 LAB — CBC WITH DIFFERENTIAL/PLATELET
Abs Immature Granulocytes: 0.12 K/uL — ABNORMAL HIGH (ref 0.00–0.07)
Basophils Absolute: 0 K/uL (ref 0.0–0.1)
Basophils Relative: 0 %
Eosinophils Absolute: 0 K/uL (ref 0.0–0.5)
Eosinophils Relative: 0 %
HCT: 45.2 % (ref 39.0–52.0)
Hemoglobin: 15.7 g/dL (ref 13.0–17.0)
Immature Granulocytes: 1 %
Lymphocytes Relative: 12 %
Lymphs Abs: 1.5 K/uL (ref 0.7–4.0)
MCH: 30.3 pg (ref 26.0–34.0)
MCHC: 34.7 g/dL (ref 30.0–36.0)
MCV: 87.3 fL (ref 80.0–100.0)
Monocytes Absolute: 1 K/uL (ref 0.1–1.0)
Monocytes Relative: 9 %
Neutro Abs: 9.4 K/uL — ABNORMAL HIGH (ref 1.7–7.7)
Neutrophils Relative %: 78 %
Platelets: 341 K/uL (ref 150–400)
RBC: 5.18 MIL/uL (ref 4.22–5.81)
RDW: 13.4 % (ref 11.5–15.5)
WBC: 12 K/uL — ABNORMAL HIGH (ref 4.0–10.5)
nRBC: 0 % (ref 0.0–0.2)

## 2024-08-13 LAB — CBC
HCT: 46.3 % (ref 39.0–52.0)
Hemoglobin: 15.7 g/dL (ref 13.0–17.0)
MCH: 29.9 pg (ref 26.0–34.0)
MCHC: 33.9 g/dL (ref 30.0–36.0)
MCV: 88.2 fL (ref 80.0–100.0)
Platelets: 307 K/uL (ref 150–400)
RBC: 5.25 MIL/uL (ref 4.22–5.81)
RDW: 13.3 % (ref 11.5–15.5)
WBC: 10.7 K/uL — ABNORMAL HIGH (ref 4.0–10.5)
nRBC: 0 % (ref 0.0–0.2)

## 2024-08-13 LAB — COMPREHENSIVE METABOLIC PANEL WITH GFR
ALT: 26 U/L (ref 0–44)
AST: 23 U/L (ref 15–41)
Albumin: 3.4 g/dL — ABNORMAL LOW (ref 3.5–5.0)
Alkaline Phosphatase: 45 U/L (ref 38–126)
Anion gap: 10 (ref 5–15)
BUN: 27 mg/dL — ABNORMAL HIGH (ref 8–23)
CO2: 27 mmol/L (ref 22–32)
Calcium: 9.3 mg/dL (ref 8.9–10.3)
Chloride: 96 mmol/L — ABNORMAL LOW (ref 98–111)
Creatinine, Ser: 1 mg/dL (ref 0.61–1.24)
GFR, Estimated: >60 mL/min (ref >60)
Glucose, Bld: 157 mg/dL — ABNORMAL HIGH (ref 70–99)
Potassium: 4.1 mmol/L (ref 3.5–5.1)
Sodium: 133 mmol/L — ABNORMAL LOW (ref 135–145)
Total Bilirubin: 0.9 mg/dL (ref 0.0–1.2)
Total Protein: 7.2 g/dL (ref 6.5–8.1)

## 2024-08-13 LAB — MAGNESIUM: Magnesium: 2.3 mg/dL (ref 1.7–2.4)

## 2024-08-13 MED ORDER — CHLORHEXIDINE GLUCONATE CLOTH 2 % EX PADS
6.0000 | MEDICATED_PAD | Freq: Once | CUTANEOUS | Status: AC
  Administered 2024-08-14: 6 via TOPICAL

## 2024-08-13 MED ORDER — CHLORHEXIDINE GLUCONATE CLOTH 2 % EX PADS
6.0000 | MEDICATED_PAD | Freq: Once | CUTANEOUS | Status: AC
  Administered 2024-08-13: 6 via TOPICAL

## 2024-08-13 MED ORDER — OXYCODONE-ACETAMINOPHEN 5-325 MG PO TABS
1.0000 | ORAL_TABLET | ORAL | Status: DC | PRN
Start: 2024-08-13 — End: 2024-08-13
  Administered 2024-08-13: 1 via ORAL
  Filled 2024-08-13: qty 1

## 2024-08-13 MED ORDER — OXYCODONE-ACETAMINOPHEN 7.5-325 MG PO TABS
1.0000 | ORAL_TABLET | ORAL | Status: DC | PRN
Start: 2024-08-13 — End: 2024-08-24
  Administered 2024-08-13 – 2024-08-24 (×49): 1 via ORAL
  Filled 2024-08-13 (×49): qty 1

## 2024-08-13 MED ORDER — CEFAZOLIN SODIUM-DEXTROSE 2-4 GM/100ML-% IV SOLN
2.0000 g | INTRAVENOUS | Status: AC
  Administered 2024-08-14: 2 g via INTRAVENOUS
  Filled 2024-08-13 (×2): qty 100

## 2024-08-13 NOTE — Hospital Course (Addendum)
 68 y.o. M with HTN, compensated hep C cirrhosis, hx VTE on Eliquis , and hx NET in 2017 who presented with paresthesias, found to have thoracic spine lesion.    Radiographic appearance has wide differential.  NSGY consulted and recommended operative resection, planned for 10/16.

## 2024-08-13 NOTE — Progress Notes (Signed)
 Assessment 68 y/o M, hx DVT on Eliquis , cirrhosis who presents with progressive thoracic myelopathy over 1 week and imaging demonstrating cervicothoracic intramedullary edema and T2 intramedullary lesion. Ddx includes metastasis, cavernoma, hemangioblastoma, astrocytoma, ependymoma, lymphoma, etc. Extraspinal workup did not reveal any obvious source  LOS: 6 days    Plan: OR today    Subjective: Right leg slightly weaker   Objective: Vital signs in last 24 hours: Temp:  [97.2 F (36.2 C)-98.1 F (36.7 C)] 97.5 F (36.4 C) (10/16 0334) Pulse Rate:  [64-82] 64 (10/16 0334) Resp:  [10-20] 20 (10/16 0334) BP: (122-154)/(66-81) 122/74 (10/16 0334) SpO2:  [96 %-99 %] 99 % (10/16 0334)  Intake/Output from previous day: 10/15 0701 - 10/16 0700 In: 10 [I.V.:10] Out: 750 [Urine:750] Intake/Output this shift: No intake/output data recorded.  Exam: Awake, alert BUE full strength and sensation LLE: subjective numbness from the inguinal ligament distally. 1/5 hip flexion. 3/5 quad, ham. 0/5 DF. 0/5 EHL. 0/5 PF RLE: subjective numbness from the knee distally. 2/5 hip flexion. 4/5 quad. 4/5 ham. 1/5 DF. 1/5 EHL. 3/5 PF  Lab Results: Recent Labs    08/13/24 0246 08/13/24 1950  WBC 10.7* 12.0*  HGB 15.7 15.7  HCT 46.3 45.2  PLT 307 341   BMET Recent Labs    08/13/24 0246  NA 133*  K 4.1  CL 96*  CO2 27  GLUCOSE 157*  BUN 27*  CREATININE 1.00  CALCIUM  9.3        [name] 08/14/2024, 7:39 AM

## 2024-08-13 NOTE — Progress Notes (Signed)
 Physical Therapy Treatment Patient Details Name: Dustin [name] MRN: [id number] DOB: [birth year] Today's Date: 08/13/2024   History of Present Illness 68 yo male presents to [facility] on 10/10 with progressive LE weakness, inability to ambulate, and bowel/bladder changes. 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 CT abd/pelvis shows splenic lesion. fall 10/11, assisted to floor by staff and no injuries sustained. PMH includes HTN, HLD, hep C, liver cirrhosis, prior DVT, GI carcinoid tumor, remote history of ETOH use.    PT Comments  Focused session on progressing pt's strength in his core and legs through seated exercises in order to improve his sitting balance and his ability and safety to stand. He currently relies on UE support to maintain his static sitting balance at EOB, often swaying in all directions when UE support is removed. He displays difficulty simultaneously extending his knees and his hips to stand upright, needing repeated reminders and cues to do so. His lack of sensation and proprioception in his legs impacts his ability to obtain and maintain leg extension and his ability to advance his legs to take steps. He remains at high risk for falls, needing modAx2 for bed mobility and sit to stand transfers and maxAx2 to stand pivot with bil knee block. Will continue to follow acutely.      If plan is discharge home, recommend the following: Help with stairs or ramp for entrance;Two people to help with walking and/or transfers;Two people to help with bathing/dressing/bathroom;Assistance with cooking/housework;Assist for transportation   Can travel by private vehicle        Equipment Recommendations  Other (comment) (TBD)    Recommendations for Other Services       Precautions / Restrictions Precautions Precautions: Fall Restrictions Weight Bearing Restrictions Per  Provider Order: No     Mobility  Bed Mobility Overal bed mobility: Needs Assistance Bed Mobility: Supine to Sit     Supine to sit: Mod assist, +2 for physical assistance, +2 for safety/equipment     General bed mobility comments: assist to bring bil legs to EOB with pt having difficulty initiating leg movement. Assist needed to scoot hips out to R EOB using bed pad with pt using bed rails to push up to sit. Assistance needed at trunk to complete ascension as well    Transfers Overall transfer level: Needs assistance Equipment used: 2 person hand held assist Transfers: Sit to/from Stand, Bed to chair/wheelchair/BSC Sit to Stand: Mod assist, +2 physical assistance, +2 safety/equipment Stand pivot transfers: Max assist, +2 physical assistance, +2 safety/equipment         General transfer comment: Mod Ax2 for rise with bil knee block and pt holding onto bil therapist's arms, x3 reps; able to achieve upright posture short periods (~<5 seconds) with cues for knee extension and bringing belly forward to promote leg and hip extension. max A +2 for pivot to R, x2 reps, with pt unable to progress LEs    Ambulation/Gait               General Gait Details: unable at this time   Stairs             Wheelchair Mobility     Tilt Bed    Modified Rankin (Stroke Patients Only)       Balance Overall balance assessment: Needs assistance Sitting-balance support: No upper extremity supported, Feet supported, Bilateral upper extremity supported Sitting  balance-Leahy Scale: Fair Sitting balance - Comments: Pt able to maintain static sitting balance unsupported in chair for >30 seconds but often needed assistance or UE support for longer durations as his trunk would flex and he would be unstable in all directions as he fatigued. Pt relying on UEs when sitting EOB   Standing balance support: Bilateral upper extremity supported, During functional activity Standing balance-Leahy  Scale: Poor Standing balance comment: reliant on bil UE support, bil knee block, and mod-maxAx2 to stand                            Communication Communication Communication: No apparent difficulties  Cognition Arousal: Alert Behavior During Therapy: WFL for tasks assessed/performed   PT - Cognitive impairments: Safety/Judgement, Problem solving, Memory, Sequencing                       PT - Cognition Comments: at times requires repeated cues and safety cues Following commands: Intact      Cueing Cueing Techniques: Gestural cues, Verbal cues, Tactile cues  Exercises General Exercises - Lower Extremity Long Arc Quad: AROM, AAROM, Strengthening, Both, 5 reps, Seated (AAROM to lift and unweight L foot from ground, AROM on R) Hip Flexion/Marching: AAROM, Strengthening, Both, 5 reps, Seated (more assistance needed on L than R) Other Exercises Other Exercises: engaged in chair push ups from recliner x10 reps with cues for optimal technique and education regarding how to keep self safe if doing without supervision from therapist Other Exercises: modified sit ups with pt leaning posteriorly until cued to stop and sit back up, 5 reps, no UE use with hands anterior to him or in lap    General Comments        Pertinent Vitals/Pain Pain Assessment Pain Assessment: Faces Faces Pain Scale: Hurts little more Pain Location: L flank Pain Descriptors / Indicators: Sore, Discomfort, Grimacing, Guarding Pain Intervention(s): Monitored during session, Limited activity within patient's tolerance, Repositioned    Home Living                          Prior Function            PT Goals (current goals can now be found in the care plan section) Acute Rehab PT Goals Patient Stated Goal: to improve PT Goal Formulation: With patient Time For Goal Achievement: 08/26/24 Potential to Achieve Goals: Good Progress towards PT goals: Progressing toward goals     Frequency    Min 2X/week      PT Plan      Co-evaluation   Reason for Co-Treatment: Complexity of the patient's impairments (multi-system involvement);For patient/therapist safety;To address functional/ADL transfers PT goals addressed during session: Mobility/safety with mobility;Strengthening/ROM;Balance OT goals addressed during session: ADL's and self-care;Strengthening/ROM      AM-PAC PT 6 Clicks Mobility   Outcome Measure  Help needed turning from your back to your side while in a flat bed without using bedrails?: A Little Help needed moving from lying on your back to sitting on the side of a flat bed without using bedrails?: Total Help needed moving to and from a bed to a chair (including a wheelchair)?: Total Help needed standing up from a chair using your arms (e.g., wheelchair or bedside chair)?: Total Help needed to walk in hospital room?: Total Help needed climbing 3-5 steps with a railing? : Total 6 Click Score: 8    End  of Session Equipment Utilized During Treatment: Gait belt Activity Tolerance: Patient tolerated treatment well Patient left: in chair;with call bell/phone within reach;with chair alarm set Nurse Communication: Mobility status;Need for lift equipment (stedy) PT Visit Diagnosis: Other abnormalities of gait and mobility (R26.89);Muscle weakness (generalized) (M62.81);Unsteadiness on feet (R26.81);Difficulty in walking, not elsewhere classified (R26.2);Other symptoms and signs involving the nervous system (R29.898)     Time: 1117-1140 PT Time Calculation (min) (ACUTE ONLY): 23 min  Charges:    $Therapeutic Exercise: 8-22 mins PT General Charges $$ ACUTE PT VISIT: 1 Visit                     Theo Ferretti, PT, DPT Acute Rehabilitation Services  Office: [phone]    Theo CHRISTELLA Ferretti 08/13/2024, 6:14 PM

## 2024-08-13 NOTE — Evaluation (Signed)
 Occupational Therapy Evaluation Patient Details Name: Dustin [name] MRN: [id number] DOB: 02-06-1956 Today's Date: 08/13/2024   History of Present Illness   68 yo male presents to [facility] on 10/10 with progressive LE weakness, inability to ambulate, and bowel/bladder changes. 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 CT abd/pelvis shows splenic lesion. fall 10/11, assisted to floor by staff and no injuries sustained. PMH includes HTN, HLD, hep C, liver cirrhosis, prior DVT, GI carcinoid tumor, remote history of ETOH use.     Clinical Impressions PTA, pt lived with spouse and reports being independent until a week ago. Upon eval, pt with BLE>UE weakness, decreased balance, core strength, functional ADL performance. Pt needing up to min A for seated UB ADL and up to total A +2 for LB ADL. Pt additionally needing mod A +2 for STS transfers x4 and max A +2 for stand pivot transfers with inability to progress Les. Will continue to follow. Due to significant functional decline, recommending intensive multidisciplinary rehabilitation >3 hours/day to optimize safety and independence in ADL.       If plan is discharge home, recommend the following:   Two people to help with walking and/or transfers;Two people to help with bathing/dressing/bathroom;Assistance with cooking/housework;Assist for transportation;Help with stairs or ramp for entrance     Functional Status Assessment   Patient has had a recent decline in their functional status and demonstrates the ability to make significant improvements in function in a reasonable and predictable amount of time.     Equipment Recommendations   Other (comment) (defer)     Recommendations for Other Services   Rehab consult     Precautions/Restrictions   Precautions Precautions: Fall Restrictions Weight Bearing  Restrictions Per Provider Order: No     Mobility Bed Mobility Overal bed mobility: Needs Assistance Bed Mobility: Supine to Sit     Supine to sit: Mod assist, +2 for physical assistance, +2 for safety/equipment     General bed mobility comments: assist to bring LE to EOB and scoot hips out    Transfers Overall transfer level: Needs assistance Equipment used: 2 person hand held assist Transfers: Sit to/from Stand, Bed to chair/wheelchair/BSC Sit to Stand: Mod assist, +2 physical assistance, +2 safety/equipment Stand pivot transfers: Max assist, +2 physical assistance, +2 safety/equipment         General transfer comment: Mod A for rise with bil knee block; able to achieve upright posture short periods (~<5 seconds) with cues for knee extension and bringing belly forward. max A +2 for pivot with pt unable to progress LEs      Balance Overall balance assessment: Needs assistance Sitting-balance support: No upper extremity supported, Feet supported Sitting balance-Leahy Scale: Fair     Standing balance support: Bilateral upper extremity supported, During functional activity Standing balance-Leahy Scale: Poor                             ADL either performed or assessed with clinical judgement   ADL Overall ADL's : Needs assistance/impaired Eating/Feeding: Bed level;Modified independent   Grooming: Minimal assistance;Sitting   Upper Body Bathing: Minimal assistance;Sitting   Lower Body Bathing: Total assistance;+2 for physical assistance;+2 for safety/equipment;Sitting/lateral leans;Sit to/from stand   Upper Body Dressing : Minimal assistance;Sitting   Lower Body Dressing: Total assistance;+2 for physical assistance;+2 for safety/equipment;Sit to/from stand   Toilet Transfer:  Moderate assistance;+2 for physical assistance;+2 for safety/equipment;Maximal assistance;Stand-pivot Toilet Transfer Details (indicate cue type and reason): max A for pivot; bil knee  block         Functional mobility during ADLs: Moderate assistance;Maximal assistance;+2 for physical assistance;+2 for safety/equipment       Vision         Perception         Praxis         Pertinent Vitals/Pain Pain Assessment Pain Assessment: Faces Faces Pain Scale: Hurts little more Pain Location: L flank Pain Descriptors / Indicators: Sore, Discomfort, Grimacing, Guarding Pain Intervention(s): Limited activity within patient's tolerance, Monitored during session     Extremity/Trunk Assessment Upper Extremity Assessment Upper Extremity Assessment: Generalized weakness (seems weaker during MMT than functional assessment)   Lower Extremity Assessment Lower Extremity Assessment: Defer to PT evaluation   Cervical / Trunk Assessment Cervical / Trunk Assessment: Kyphotic   Communication Communication Communication: No apparent difficulties   Cognition Arousal: Alert Behavior During Therapy: WFL for tasks assessed/performed Cognition: No apparent impairments                               Following commands: Intact       Cueing  General Comments   Cueing Techniques: Gestural cues;Verbal cues      Exercises Exercises: Other exercises Other Exercises Other Exercises: engaged in chair push ups from recliner x10 reps with cues for optimal technique and education regarding how to keep self safe if doing without supervision from therapist   Shoulder Instructions      Home Living Family/patient expects to be discharged to:: Private residence Living Arrangements: Spouse/significant other Available Help at Discharge: Family Type of Home: Apartment Home Access: Level entry     Home Layout: One level     Bathroom Shower/Tub: Producer, television/film/video: Standard     Home Equipment: Medical laboratory scientific officer - single point   Additional Comments: very old RW in his outbuilding closet, needs a new one      Prior Functioning/Environment Prior Level of  Function : Independent/Modified Independent;History of Falls (last six months)             Mobility Comments: prior to 1 week ago, pt independent with ADLs and mobility. Progressive weakness x1 week PTA with inability to walk      OT Problem List: Decreased strength;Decreased activity tolerance;Impaired balance (sitting and/or standing);Decreased range of motion;Decreased coordination;Decreased safety awareness;Decreased knowledge of use of DME or AE;Decreased knowledge of precautions;Impaired sensation (AROM of BLE)   OT Treatment/Interventions: Self-care/ADL training;Therapeutic exercise;DME and/or AE instruction;Therapeutic activities;Patient/family education;Balance training;Neuromuscular education      OT Goals(Current goals can be found in the care plan section)   Acute Rehab OT Goals Patient Stated Goal: get better OT Goal Formulation: With patient Time For Goal Achievement: 08/27/24 Potential to Achieve Goals: Good   OT Frequency:  Min 2X/week    Co-evaluation PT/OT/SLP Co-Evaluation/Treatment: Yes Reason for Co-Treatment: Complexity of the patient's impairments (multi-system involvement);For patient/therapist safety;To address functional/ADL transfers PT goals addressed during session: Mobility/safety with mobility;Strengthening/ROM;Balance OT goals addressed during session: ADL's and self-care;Strengthening/ROM      AM-PAC OT 6 Clicks Daily Activity     Outcome Measure Help from another person eating meals?: None Help from another person taking care of personal grooming?: A Little Help from another person toileting, which includes using toliet, bedpan, or urinal?: Total Help from another person bathing (including washing, rinsing,  drying)?: A Lot Help from another person to put on and taking off regular upper body clothing?: A Little Help from another person to put on and taking off regular lower body clothing?: Total 6 Click Score: 14   End of Session  Equipment Utilized During Treatment: Gait belt Nurse Communication: Mobility status  Activity Tolerance: Patient tolerated treatment well Patient left: in chair;with call bell/phone within reach;with chair alarm set  OT Visit Diagnosis: Unsteadiness on feet (R26.81);Muscle weakness (generalized) (M62.81)                Time: 8883-8858 OT Time Calculation (min): 25 min Charges:  OT General Charges $OT Visit: 1 Visit OT Evaluation $OT Eval Moderate Complexity: 1 Mod  Elma JONETTA Lebron FREDERICK, OTR/L [facility] Acute Rehabilitation Office: [phone]   Elma JONETTA Lebron 08/13/2024, 1:49 PM

## 2024-08-13 NOTE — Progress Notes (Signed)
  Progress Note   Patient: Dustin [name] FMW:[id number] DOB: [birth year] DOA: 08/08/2024     5 DOS: the patient was seen and examined on 08/13/2024 at 8:50AM      Brief hospital course: 68 y.o. M with HTN, compensated hep C cirrhosis, hx VTE on Eliquis , and hx low grade GI carcinoid in 2017 who presented with paresthesias, found to have thoracic spine lesion.    Radiographic appearance has wide differential.  NSGY consulted and recommended operative resection, planned for 10/16.     Assessment and Plan: T2 intramedullary spinal lesion Per neurosurgery, the differential is broad.  See further summary from Dr. Trixie from 10/14. - Continue Decadron  - Plan for surgical resection 10/16  Hypertension Blood pressure slightly elevated - Continue home amlodipine , carvedilol , HCTZ  Hyperlipidemia - Continue atorvastatin   History of DVT - Hold home Eliquis   History of hep C cirrhosis No evidence of decompensation.  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       Subjective: Patient is feeling well, he has had no clinical change, still having leg weakness and discomfort.  Pain is not well-controlled.  He said no fever, respiratory symptoms, chest pain, confusion.     Physical Exam: BP (!) 144/81 (BP Location: Right Arm)   Pulse 80   Temp 98 F (36.7 C) (Oral)   Resp 18   Ht 5' 11 (1.803 m)   Wt 95.3 kg   SpO2 96%   BMI 29.29 kg/m   Adult male, lying in bed, interactive and appropriate RRR, no murmurs, no peripheral edema Respiratory rate normal, lungs clear without rales or wheezes Abdomen soft, no tenderness palpation or guarding, no ascites or distention Attention normal, affect normal, judgment and insight appear normal    Data Reviewed: Basic metabolic panel shows mild hyponatremia CBC shows no anemia or thrombocytopenia         Disposition: Status is: Inpatient         Author: [name], MD 08/13/2024 1:20 PM  For on call review [email or web address].

## 2024-08-14 ENCOUNTER — Inpatient Hospital Stay (HOSPITAL_COMMUNITY)

## 2024-08-14 ENCOUNTER — Encounter (HOSPITAL_COMMUNITY): Payer: Self-pay | Admitting: Internal Medicine

## 2024-08-14 ENCOUNTER — Inpatient Hospital Stay (HOSPITAL_COMMUNITY)
Admission: EM | Disposition: A | Discharge status: Skilled Nursing Facility | Payer: Self-pay | Source: Home / Self Care | Attending: Internal Medicine

## 2024-08-14 DIAGNOSIS — D497 Neoplasm of unspecified behavior of endocrine glands and other parts of nervous system: Secondary | ICD-10-CM | POA: Diagnosis not present

## 2024-08-14 DIAGNOSIS — I10 Essential (primary) hypertension: Secondary | ICD-10-CM

## 2024-08-14 DIAGNOSIS — G9589 Other specified diseases of spinal cord: Secondary | ICD-10-CM

## 2024-08-14 DIAGNOSIS — I4891 Unspecified atrial fibrillation: Secondary | ICD-10-CM | POA: Diagnosis not present

## 2024-08-14 DIAGNOSIS — D496 Neoplasm of unspecified behavior of brain: Secondary | ICD-10-CM | POA: Diagnosis not present

## 2024-08-14 DIAGNOSIS — E785 Hyperlipidemia, unspecified: Secondary | ICD-10-CM

## 2024-08-14 HISTORY — PX: LAMINECTOMY: SHX219

## 2024-08-14 HISTORY — PX: OPERATIVE ULTRASOUND: SHX5996

## 2024-08-14 LAB — POCT I-STAT 7, (LYTES, BLD GAS, ICA,H+H)
Acid-base deficit: 2 mmol/L (ref 0.0–2.0)
Bicarbonate: 22.5 mmol/L (ref 20.0–28.0)
Calcium, Ion: 1.1 mmol/L — ABNORMAL LOW (ref 1.15–1.40)
HCT: 38 % — ABNORMAL LOW (ref 39.0–52.0)
Hemoglobin: 12.9 g/dL — ABNORMAL LOW (ref 13.0–17.0)
O2 Saturation: 99 %
Patient temperature: 36.2
Potassium: 4.1 mmol/L (ref 3.5–5.1)
Sodium: 131 mmol/L — ABNORMAL LOW (ref 135–145)
TCO2: 24 mmol/L (ref 22–32)
pCO2 arterial: 35.7 mmHg (ref 32–48)
pH, Arterial: 7.405 (ref 7.35–7.45)
pO2, Arterial: 148 mmHg — ABNORMAL HIGH (ref 83–108)

## 2024-08-14 LAB — TYPE AND SCREEN
ABO/RH(D): A POS
Antibody Screen: NEGATIVE

## 2024-08-14 LAB — SURGICAL PCR SCREEN
MRSA, PCR: NEGATIVE
Staphylococcus aureus: POSITIVE — AB

## 2024-08-14 LAB — SURGICAL PATHOLOGY

## 2024-08-14 SURGERY — THORACIC LAMINECTOMY FOR TUMOR
Anesthesia: General | Site: Spine Thoracic

## 2024-08-14 MED ORDER — THROMBIN 5000 UNITS EX SOLR: OROMUCOSAL | Status: DC | PRN

## 2024-08-14 MED ORDER — CYCLOBENZAPRINE HCL 10 MG PO TABS
5.0000 mg | ORAL_TABLET | Freq: Three times a day (TID) | ORAL | Status: DC
  Administered 2024-08-15 – 2024-08-24 (×29): 5 mg via ORAL
  Filled 2024-08-14 (×29): qty 1

## 2024-08-14 MED ORDER — CHLORHEXIDINE GLUCONATE 0.12 % MT SOLN
OROMUCOSAL | Status: AC
  Administered 2024-08-14: 15 mL via OROMUCOSAL
  Filled 2024-08-14: qty 15

## 2024-08-14 MED ORDER — HYDROMORPHONE HCL 1 MG/ML IJ SOLN
0.5000 mg | INTRAMUSCULAR | Status: DC | PRN
  Administered 2024-08-15 – 2024-08-19 (×20): 0.5 mg via INTRAVENOUS
  Filled 2024-08-14 (×20): qty 0.5

## 2024-08-14 MED ORDER — SUFENTANIL CITRATE 50 MCG/ML IV SOLN
0.2500 ug/kg/h | INTRAVENOUS | Status: AC
  Administered 2024-08-14: .25 ug/kg/h via INTRAVENOUS
  Filled 2024-08-14: qty 1

## 2024-08-14 MED ORDER — ONDANSETRON HCL 4 MG/2ML IJ SOLN
INTRAMUSCULAR | Status: DC | PRN
  Administered 2024-08-14: 4 mg via INTRAVENOUS

## 2024-08-14 MED ORDER — PROPOFOL 10 MG/ML IV BOLUS
INTRAVENOUS | Status: AC
  Filled 2024-08-14: qty 20

## 2024-08-14 MED ORDER — SUCCINYLCHOLINE CHLORIDE 200 MG/10ML IV SOSY
PREFILLED_SYRINGE | INTRAVENOUS | Status: DC | PRN
  Administered 2024-08-14: 100 mg via INTRAVENOUS

## 2024-08-14 MED ORDER — PROPOFOL 10 MG/ML IV BOLUS
INTRAVENOUS | Status: DC | PRN
  Administered 2024-08-14: 100 ug/kg/min via INTRAVENOUS
  Administered 2024-08-14: 150 mg via INTRAVENOUS

## 2024-08-14 MED ORDER — FENTANYL CITRATE (PF) 250 MCG/5ML IJ SOLN
INTRAMUSCULAR | Status: DC | PRN
  Administered 2024-08-14: 100 ug via INTRAVENOUS
  Administered 2024-08-14: 50 ug via INTRAVENOUS

## 2024-08-14 MED ORDER — THROMBIN 5000 UNITS EX KIT
PACK | CUTANEOUS | Status: AC
  Filled 2024-08-14: qty 2

## 2024-08-14 MED ORDER — ALBUMIN HUMAN 5 % IV SOLN: INTRAVENOUS | Status: DC | PRN

## 2024-08-14 MED ORDER — LACTATED RINGERS IV SOLN: INTRAVENOUS | Status: DC

## 2024-08-14 MED ORDER — MUPIROCIN 2 % EX OINT
1.0000 | TOPICAL_OINTMENT | Freq: Two times a day (BID) | CUTANEOUS | Status: AC
  Administered 2024-08-14 – 2024-08-18 (×10): 1 via NASAL
  Filled 2024-08-14: qty 22

## 2024-08-14 MED ORDER — SODIUM CHLORIDE 0.9 % IR SOLN
Status: DC | PRN
  Administered 2024-08-14 (×2): 1000 mL

## 2024-08-14 MED ORDER — SUFENTANIL CITRATE 50 MCG/ML IV SOLN
0.2500 ug/kg/h | INTRAVENOUS | Status: DC
  Filled 2024-08-14: qty 1

## 2024-08-14 MED ORDER — CHLORHEXIDINE GLUCONATE 0.12 % MT SOLN: 15.0000 mL | Freq: Once | OROMUCOSAL | Status: AC

## 2024-08-14 MED ORDER — VASOPRESSIN 20 UNIT/ML IV SOLN
INTRAVENOUS | Status: DC | PRN
  Administered 2024-08-14: .5 [IU] via INTRAVENOUS

## 2024-08-14 MED ORDER — FENTANYL CITRATE (PF) 250 MCG/5ML IJ SOLN
INTRAMUSCULAR | Status: AC
  Filled 2024-08-14: qty 5

## 2024-08-14 MED ORDER — PHENYLEPHRINE HCL-NACL 20-0.9 MG/250ML-% IV SOLN
INTRAVENOUS | Status: DC | PRN
  Administered 2024-08-14: 30 ug/min via INTRAVENOUS

## 2024-08-14 MED ORDER — HYDROMORPHONE HCL 1 MG/ML IJ SOLN
0.5000 mg | Freq: Once | INTRAMUSCULAR | Status: AC
  Administered 2024-08-14: 0.5 mg via INTRAVENOUS
  Filled 2024-08-14: qty 0.5

## 2024-08-14 MED ORDER — THROMBIN 5000 UNITS EX KIT
PACK | CUTANEOUS | Status: AC
  Filled 2024-08-14: qty 1

## 2024-08-14 MED ORDER — GADOBUTROL 1 MMOL/ML IV SOLN
10.0000 mL | Freq: Once | INTRAVENOUS | Status: AC | PRN
  Administered 2024-08-14: 10 mL via INTRAVENOUS

## 2024-08-14 MED ORDER — VASOPRESSIN 20 UNITS/100 ML INFUSION FOR SHOCK
0.0000 [IU]/min | INTRAVENOUS | Status: DC
  Filled 2024-08-14: qty 100

## 2024-08-14 MED ORDER — DIAZEPAM 5 MG/ML IJ SOLN
5.0000 mg | Freq: Once | INTRAMUSCULAR | Status: AC | PRN
  Administered 2024-08-14: 5 mg via INTRAVENOUS
  Filled 2024-08-14: qty 2

## 2024-08-14 MED ORDER — PHENYLEPHRINE 80 MCG/ML (10ML) SYRINGE FOR IV PUSH (FOR BLOOD PRESSURE SUPPORT)
PREFILLED_SYRINGE | INTRAVENOUS | Status: DC | PRN
  Administered 2024-08-14 (×4): 160 ug via INTRAVENOUS

## 2024-08-14 MED ORDER — ADHERUS DURAL SEALANT
PACK | TOPICAL | Status: DC | PRN
  Administered 2024-08-14: 1 via TOPICAL

## 2024-08-14 MED ORDER — LIDOCAINE-EPINEPHRINE 1 %-1:100000 IJ SOLN
INTRAMUSCULAR | Status: AC
  Filled 2024-08-14: qty 1

## 2024-08-14 MED ORDER — ORAL CARE MOUTH RINSE: 15.0000 mL | Freq: Once | OROMUCOSAL | Status: AC

## 2024-08-14 MED ORDER — VASOPRESSIN 20 UNIT/ML IV SOLN
INTRAVENOUS | Status: AC
  Filled 2024-08-14: qty 1

## 2024-08-14 MED ORDER — LACTATED RINGERS IV SOLN
INTRAVENOUS | Status: DC | PRN
Start: 2024-08-14 — End: 2024-08-14

## 2024-08-14 MED ORDER — 0.9 % SODIUM CHLORIDE (POUR BTL) OPTIME
TOPICAL | Status: DC | PRN
  Administered 2024-08-14: 1000 mL

## 2024-08-14 MED ORDER — CHLORHEXIDINE GLUCONATE CLOTH 2 % EX PADS
6.0000 | MEDICATED_PAD | Freq: Every day | CUTANEOUS | Status: AC
  Administered 2024-08-14 – 2024-08-18 (×5): 6 via TOPICAL

## 2024-08-14 MED ORDER — LIDOCAINE 2% (20 MG/ML) 5 ML SYRINGE
INTRAMUSCULAR | Status: DC | PRN
  Administered 2024-08-14: 80 mg via INTRAVENOUS

## 2024-08-14 MED ORDER — DEXAMETHASONE SOD PHOSPHATE PF 10 MG/ML IJ SOLN
INTRAMUSCULAR | Status: DC | PRN
  Administered 2024-08-14: 10 mg via INTRAVENOUS

## 2024-08-14 SURGICAL SUPPLY — 44 items
BAG COUNTER SPONGE SURGICOUNT (BAG) ×3 IMPLANT
BAND RUBBER #18 3X1/16 STRL (MISCELLANEOUS) ×6 IMPLANT
BUR MATCHSTICK NEURO 3.0 LAGG (BURR) IMPLANT
BUR PRECISION FLUTE 5.0 (BURR) IMPLANT
CANISTER SUCTION 3000ML PPV (SUCTIONS) ×3 IMPLANT
DERMABOND ADVANCED .7 DNX12 (GAUZE/BANDAGES/DRESSINGS) ×3 IMPLANT
DRAPE LAPAROTOMY 100X72X124 (DRAPES) IMPLANT
DRAPE MICROSCOPE SLANT 54X150 (MISCELLANEOUS) ×3 IMPLANT
DRSG OPSITE POSTOP 4X6 (GAUZE/BANDAGES/DRESSINGS) IMPLANT
ELECTRODE REM PT RTRN 9FT ADLT (ELECTROSURGICAL) ×3 IMPLANT
FEE INTRAOP CADWELL SUPPLY NCS (MISCELLANEOUS) IMPLANT
FEE INTRAOP MONITOR IMPULS NCS (MISCELLANEOUS) IMPLANT
FORCEPS BIPO MALIS IRRIG 9X1.5 (NEUROSURGERY SUPPLIES) IMPLANT
FORCEPS BIPOLAR SPETZLER 8 1.0 (NEUROSURGERY SUPPLIES) IMPLANT
GAUZE 4X4 16PLY ~~LOC~~+RFID DBL (SPONGE) IMPLANT
GOWN STRL REUS W/ TWL LRG LVL3 (GOWN DISPOSABLE) IMPLANT
GOWN STRL REUS W/ TWL XL LVL3 (GOWN DISPOSABLE) IMPLANT
GOWN STRL REUS W/TWL 2XL LVL3 (GOWN DISPOSABLE) IMPLANT
HEMOSTAT POWDER KIT SURGIFOAM (HEMOSTASIS) ×3 IMPLANT
HEMOSTAT SURGICEL 2X14 (HEMOSTASIS) IMPLANT
KIT BASIN OR (CUSTOM PROCEDURE TRAY) ×3 IMPLANT
KIT POSITIONER JACKSON TABLE (MISCELLANEOUS) ×3 IMPLANT
KIT TURNOVER KIT B (KITS) ×3 IMPLANT
KNIFE ARACHNOID DISP AM-24-S (MISCELLANEOUS) IMPLANT
NDL HYPO 22X1.5 SAFETY MO (MISCELLANEOUS) ×3 IMPLANT
NEEDLE HYPO 22X1.5 SAFETY MO (MISCELLANEOUS) ×2 IMPLANT
PACK LAMINECTOMY NEURO (CUSTOM PROCEDURE TRAY) ×3 IMPLANT
PAD ARMBOARD POSITIONER FOAM (MISCELLANEOUS) ×9 IMPLANT
SEALANT ADHERUS EXTEND TIP (MISCELLANEOUS) IMPLANT
SEALER BIPOLAR AQUA 6.0 (INSTRUMENTS) IMPLANT
SET TUBING IRRIGATION DISP (TUBING) IMPLANT
SOLN 0.9% NACL 1000 ML (IV SOLUTION) ×2 IMPLANT
SOLN 0.9% NACL POUR BTL 1000ML (IV SOLUTION) ×3 IMPLANT
SOLN STERILE WATER 1000 ML (IV SOLUTION) ×2 IMPLANT
SOLN STERILE WATER BTL 1000 ML (IV SOLUTION) ×3 IMPLANT
SPONGE SURGIFOAM ABS GEL 100 (HEMOSTASIS) ×3 IMPLANT
SPONGE T-LAP 4X18 ~~LOC~~+RFID (SPONGE) IMPLANT
SUT MNCRL AB 4-0 PS2 18 (SUTURE) ×3 IMPLANT
SUT NURALON 4 0 TR CR/8 (SUTURE) ×3 IMPLANT
SUT PROLENE 6 0 BV (SUTURE) IMPLANT
SUT VIC AB 0 CT1 18XCR BRD8 (SUTURE) ×3 IMPLANT
SUT VIC AB 2-0 CP2 18 (SUTURE) ×3 IMPLANT
TOWEL GREEN STERILE (TOWEL DISPOSABLE) ×3 IMPLANT
TOWEL GREEN STERILE FF (TOWEL DISPOSABLE) ×3 IMPLANT

## 2024-08-14 NOTE — Anesthesia Postprocedure Evaluation (Signed)
 Anesthesia Post Note  Patient: DOMINQUE MARLIN  Procedure(s) Performed: THORACIC ONE-THREE LAMINECTOMY FOR TUMOR (Spine Thoracic) US  INTRAOPERATIVE     Patient location during evaluation: PACU Anesthesia Type: General Level of consciousness: awake and alert Pain management: pain level controlled Vital Signs Assessment: post-procedure vital signs reviewed and stable Respiratory status: spontaneous breathing, nonlabored ventilation, respiratory function stable and patient connected to nasal cannula oxygen Cardiovascular status: blood pressure returned to baseline and stable Postop Assessment: no apparent nausea or vomiting Anesthetic complications: no   No notable events documented.  Last Vitals:  Vitals:   08/14/24 1930 08/14/24 1946  BP: 114/72 113/80  Pulse: 65 71  Resp: 13 12  Temp:  36.7 C  SpO2: 96% 100%    Last Pain:  Vitals:   08/14/24 2015  TempSrc:   PainSc: 9                  Krystyne Tewksbury,W. EDMOND

## 2024-08-14 NOTE — Progress Notes (Signed)
 Assessment 68 y/o M, hx DVT on Eliquis , cirrhosis who presents with progressive thoracic myelopathy over 1 week and imaging demonstrating cervicothoracic intramedullary edema and T2 intramedullary lesion. Ddx includes metastasis, cavernoma, hemangioblastoma, astrocytoma, ependymoma, lymphoma, etc. Extraspinal workup did not reveal any obvious source. Underwent T1-3 laminectomy resection of intramedullary mass on 10/16  LOS: 6 days    Plan: Dex 4q6. Will wean over coming days MRI Tspine Activity as tolerated Diet as tolerated Ok for DVT ppx on 10/19 Ok for Eliquis  on 10/23 Await intra-op path   Subjective: Pt awoke without issue   Objective: Vital signs in last 24 hours: Temp:  [97.2 F (36.2 C)-98.1 F (36.7 C)] 98.1 F (36.7 C) (10/16 1336) Pulse Rate:  [64-82] 74 (10/16 1336) Resp:  [10-20] 20 (10/16 1336) BP: (122-128)/(71-91) 124/71 (10/16 1336) SpO2:  [96 %-99 %] 96 % (10/16 1336) Weight:  [95.3 kg] 95.3 kg (10/16 1336)  Intake/Output from previous day: 10/15 0701 - 10/16 0700 In: 10 [I.V.:10] Out: 750 [Urine:750] Intake/Output this shift: Total I/O In: 3250 [I.V.:2500; IV Piggyback:750] Out: 700 [Urine:200; Blood:500]  Exam: Awake, alert BUE full strength and sensation LLE: subjective numbness from the inguinal ligament distally. 1/5 hip flexion. 2/5 quad, ham. 0/5 DF. 0/5 EHL. 0/5 PF RLE: subjective numbness from the knee distally. 2/5 hip flexion. 4/5 quad. 3/5 ham. 0/5 DF. 0/5 EHL. 0/5 PF  Lab Results: Recent Labs    08/13/24 0246 08/13/24 1950  WBC 10.7* 12.0*  HGB 15.7 15.7  HCT 46.3 45.2  PLT 307 341   BMET Recent Labs    08/13/24 0246  NA 133*  K 4.1  CL 96*  CO2 27  GLUCOSE 157*  BUN 27*  CREATININE 1.00  CALCIUM  9.3        Dustin [name] 08/14/2024, 6:19 PM

## 2024-08-14 NOTE — Progress Notes (Signed)
  Progress Note   Patient: Dustin [name] FMW:[id number] DOB: 04-30-1956 DOA: 08/08/2024     6 DOS: the patient was seen and examined on 08/14/2024 at 10:04AM      Brief hospital course: 68 y.o. M with HTN, compensated hep C cirrhosis, hx VTE on Eliquis , and hx NET in 2017 who presented with paresthesias, found to have thoracic spine lesion.    Radiographic appearance has wide differential.  NSGY consulted and recommended operative resection, planned for 10/16.     Assessment and Plan: T2 intramedullary spinal lesion Findings on initial imaging showed intramedullary T1-2 lesion with surrounding edema.  MRI C-spine on 10/11 showed showed intramedullary lesion extending from T1-2 through the mid body of T1 measuring 5 x 17 mm   NSGY consulted, CT C/A/P showed no extra-CNS focus of disease.  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   Differential for the spinal lesion includes ependymoma, astrocytoma, lymphoma, hemangioblastoma, cavernoma, or metastasis.    - Decadron  per NSGY - Surgical resection today, post-op plan per NSGY   Hypertension BP normal  - Continue home amlodipine , carvedilol , HCTZ   Hyperlipidemia - Continue atorvastatin    History of DVT - Hold Eliquis , this should be resumed post-op as soon as reasonably safe from surgery's perspective   History of hep C cirrhosis No evidence of decompensation.  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           Subjective: No new complaints.  No nursing concerns.     Physical Exam: BP 124/71   Pulse 74   Temp 98.1 F (36.7 C) (Oral)   Resp 20   Ht 5' 11 (1.803 m)   Wt 95.3 kg   SpO2 96%   BMI 29.30 kg/m   Adult male, lying in bed, no acute distress RRR no murmurs, no LE edema Respiratory rate normal, lungs clear Upper extremity strength appears normal on right,  slightly diminished on legs, effort limited, bilaterally legs are very weak and has loss of sensation to light touch.   Data Reviewed: None new        Disposition: Status is: Inpatient         Author: [name], MD 08/14/2024 2:04 PM  For on call review [email or web address].

## 2024-08-14 NOTE — Progress Notes (Signed)
 PT Cancellation Note  Patient Details Name: ROCKLIN SODERQUIST MRN: [id number] DOB: 04/07/1956   Cancelled Treatment:    Reason Eval/Treat Not Completed: (P) Patient at procedure or test/unavailable. Will plan to follow-up as able.   Theo Ferretti, PT, DPT Acute Rehabilitation Services  Office: [phone]    Theo CHRISTELLA Ferretti 08/14/2024, 2:02 PM

## 2024-08-14 NOTE — Progress Notes (Signed)
   08/14/24 1147  TOC Brief Assessment  Insurance and Status Reviewed  Patient has primary care physician Yes  Home environment has been reviewed home/private residence  Prior level of function: self/independent  Prior/Current Home Services No current home services  Social Drivers of Health Review SDOH reviewed no interventions necessary  Readmission risk has been reviewed Yes  Transition of care needs transition of care needs identified, TOC will continue to follow    Plan for OR today for tumor resection. Note that CIR is following for potential admit pending patient progress- Inpatient Care Management (ICM) will continue to monitor patient advancement through interdisciplinary progression rounds. If new patient transition needs arise, please place a ICM (CM/CSW) consult.

## 2024-08-14 NOTE — Progress Notes (Signed)
 Assessment 68 y/o M, hx DVT on Eliquis , cirrhosis who presents with progressive thoracic myelopathy over 1 week and imaging demonstrating cervicothoracic intramedullary edema and T2 intramedullary lesion. Ddx includes metastasis, cavernoma, hemangioblastoma, astrocytoma, ependymoma, lymphoma, etc. Extraspinal workup did not reveal any obvious source. Underwent T1-3 laminectomy resection of intramedullary mass on 10/16  LOS: 7 days    Plan: Decadron  wean placed MRI Tspine - no residual Activity as tolerated Diet as tolerated Ok for DVT ppx on 10/19 Ok for Eliquis  on 10/30 Await intra-op path   Subjective: Neurologically unchanged. Having expected back pain. No headache   Objective: Vital signs in last 24 hours: Temp:  [97.6 F (36.4 C)-98.1 F (36.7 C)] 97.6 F (36.4 C) (10/17 0734) Pulse Rate:  [63-83] 74 (10/17 0734) Resp:  [9-21] 14 (10/17 0734) BP: (105-147)/(39-91) 134/69 (10/17 0734) SpO2:  [85 %-100 %] 96 % (10/17 0734) Weight:  [95.3 kg] 95.3 kg (10/16 1336)  Intake/Output from previous day: 10/16 0701 - 10/17 0700 In: 3250 [I.V.:2500; IV Piggyback:750] Out: 1200 [Urine:700; Blood:500] Intake/Output this shift: Total I/O In: -  Out: 800 [Urine:800]  Exam: Awake, alert BUE full strength and sensation LLE: subjective numbness from the inguinal ligament distally. 1/5 hip flexion. 2/5 quad, ham. 0/5 DF. 0/5 EHL. 0/5 PF RLE: subjective numbness from the knee distally. 2/5 hip flexion. 4/5 quad. 3/5 ham. 3/5 DF. 3/5 EHL. 3/5 PF Honeycomb dressing covering incision  Lab Results: Recent Labs    08/13/24 1950 08/14/24 1652 08/15/24 0519  WBC 12.0*  --  10.2  HGB 15.7 12.9* 12.7*  HCT 45.2 38.0* 37.2*  PLT 341  --  289   BMET Recent Labs    08/13/24 0246 08/14/24 1652 08/15/24 0519  NA 133* 131* 131*  K 4.1 4.1 4.2  CL 96*  --  94*  CO2 27  --  27  GLUCOSE 157*  --  155*  BUN 27*  --  23  CREATININE 1.00  --  0.86  CALCIUM  9.3  --  8.7[name] Dorn SAUNDERS Taliesin Hartlage 08/15/2024, 8:09 AM

## 2024-08-14 NOTE — Progress Notes (Signed)
Patient off the unit for MRI.

## 2024-08-14 NOTE — Transfer of Care (Signed)
 Immediate Anesthesia Transfer of Care Note  Patient: Dustin [name]  Procedure(s) Performed: THORACIC ONE-THREE LAMINECTOMY FOR TUMOR (Spine Thoracic) US  INTRAOPERATIVE  Patient Location: PACU  Anesthesia Type:General  Level of Consciousness: drowsy  Airway & Oxygen Therapy: Patient connected to face mask oxygen  Post-op Assessment: Report given to RN and Post -op Vital signs reviewed and stable  Post vital signs: Reviewed and stable  Last Vitals:  Vitals Value Taken Time  BP 147/82 08/14/24 18:36  Temp    Pulse 70 08/14/24 18:37  Resp 15 08/14/24 18:37  SpO2 98 % 08/14/24 18:37  Vitals shown include unfiled device data.  Last Pain:  Vitals:   08/14/24 1347  TempSrc:   PainSc: 0-No pain      Patients Stated Pain Goal: 0 (08/12/24 0324)  Complications: No notable events documented.

## 2024-08-14 NOTE — Op Note (Signed)
 PREOP DIAGNOSIS:  Intramedullary mass  POSTOP DIAGNOSIS: Same  PROCEDURE: Resection of intramedullary mass T1-T3 laminectomy  SURGEON: Dorn Glade, MD  CO-SURGEON: Gerldine Maizes, MD. directly assisted with resection of the intramedullary mass  ANESTHESIA: General Endotracheal  EBL: 500cc  SPECIMENS: Spinal cord tumor for permanent  DRAINS: None  COMPLICATIONS: None  CONDITION: Stable  NEUROMONITORING: Unfortunately there was no MEP nor SSEP data in the bilateral lower extremities throughout the entirety of the case.  Upper extremity data was stable throughout  HISTORY: Dustin [name] is a 68 y.o. male who presented with rapidly progressive thoracic myelopathy refractory to high-dose steroids.  Imaging studies a intramedullary spinal cord tumor at the level of T2 with surrounding edema rostral and caudal to the lesion.  They also revealed revealed a splenic hamartoma which was noncontributory to the clinical picture.  Given the lack of extraspinal source and worsening myelopathy we offered a resection of the spinal cord mass.  We explained the risks of bleeding, infection, CSF leak, spinal cord injury, nerve root injury, incomplete resection, cardiorespiratory risk.  He verbalized understanding and wished to proceed  PROCEDURE IN DETAIL: The patient was brought to the operating room. After induction of general anesthesia, the patient was positioned on the operative table in the prone position with head in the [place] head holder. All pressure points were meticulously padded.  AP fluoroscopy was used to mark the skin incision from T1-T3.  Skin incision was prepped and draped in the usual sterile fashion.  I performed a sharp midline incision from T1-T3.  I used electrocautery to perform a midline subperiosteal dissection of the T1-T3 lamina.  AP fluoroscopy was used to confirm the correct level.  We then used rongeur's to resect the bottom half of the T1 spinous process and  the entirety of the T2 spinous process.  We then used high-speed bur to drill bilateral troughs along the T2 lamina.  We then resected the T2 lamina with rongeur's revealing the underlying ligamentum flavum and epidural fat.  We then used a rongeur's to resect the ligamentum flavum to the level of the lateral pedicles.  This decompression was extended rostrally to include the bottom half of the T1 lamina.  We also used rongeurs and drill to resect part of the top of the T3 lamina.  We had to deal with extensive epidural veins during this process.  Ultimately hemostasis was achieved.  Ultrasound was used to confirm correct localization prior to opening the dura.  We made a sharp midline incision along the dura in a rostral caudal direction from T1-T3.  We used tack up sutures bilaterally to keep the dura open.  We then used Rhoton microdissectors to dissect to the arachnoid overlying the spinal cord.  Upon doing the spinal cord, there was an obvious brownish-bluish discoloration on the dorsal aspect of the cord.  We then used a bipolar and a sharp dissection to dissect the pia over this discolored lesion revealing the presence of a hematoma.  After the hematoma was evacuated, we noticed there was a mass lesion within the hematoma cavity.  This was brownish in color and had the gross appearance of a cavernoma.  We used gentle blunt dissection with Rhoton microdissectors to gently develop a plane between the mass and the underlying spinal cord.  The mass was then removed en bloc and sent for permanent pathology.  The cavity was closely inspected to ensure that no mass was left behind.  There was hemosiderin stained and edematous spinal  cord within the cavity which we did not feel was representative of a mass lesion.  After careful inspection we copiously irrigated the hematoma cavity and achieved hemostasis.  We then closed the midline dural opening with running 6-0 Prolene sutures.  Valsalva maneuver showed that  there was no egress of spinal fluid from the durotomy.  We then covered the dural opening with fibrin glue.  The wound was copiously irrigated.  Hemostasis was achieved.  Retractors were removed.  The muscular layer was closed with 0 Vicryl.  The fascial layer was closed with 1 Vicryl.  The subcutaneous layer was closed with 2-0 and 3-0 Vicryl.  The skin was closed with running 4 Monocryl and glue   At the end of the case all sponge, needle, and instrument counts were correct. The patient was then transferred to the stretcher, extubated, and taken to the post-anesthesia care unit in stable hemodynamic condition.

## 2024-08-14 NOTE — Anesthesia Procedure Notes (Signed)
 Arterial Line Insertion Start/End10/16/2025 2:00 PM Performed by: Hedy Jarred, CRNA, CRNA  Patient location: Pre-op. Preanesthetic checklist: patient identified, IV checked, site marked, risks and benefits discussed, surgical consent, monitors and equipment checked, pre-op evaluation, timeout performed and anesthesia consent Lidocaine  1% used for infiltration radial was placed Catheter size: 20 G Hand hygiene performed  and maximum sterile barriers used   Attempts: 1 Following insertion, dressing applied and Biopatch. Post procedure assessment: normal and unchanged  Patient tolerated the procedure well with no immediate complications.

## 2024-08-14 NOTE — Anesthesia Preprocedure Evaluation (Addendum)
 Anesthesia Evaluation  Patient identified by MRN, date of birth, ID band Patient awake    Reviewed: Allergy & Precautions, H&P , NPO status , Patient's Chart, lab work & pertinent test results, reviewed documented beta blocker date and time   Airway Mallampati: II  TM Distance: >3 FB Neck ROM: Full    Dental no notable dental hx. (+) Partial Lower, Partial Upper, Dental Advisory Given   Pulmonary neg pulmonary ROS   Pulmonary exam normal breath sounds clear to auscultation       Cardiovascular hypertension, Pt. on medications and Pt. on home beta blockers + dysrhythmias Atrial Fibrillation  Rhythm:Regular Rate:Normal     Neuro/Psych negative neurological ROS  negative psych ROS   GI/Hepatic negative GI ROS, Neg liver ROS,,,  Endo/Other  negative endocrine ROS    Renal/GU negative Renal ROS  negative genitourinary   Musculoskeletal  (+) Arthritis , Osteoarthritis,    Abdominal   Peds  Hematology negative hematology ROS (+)   Anesthesia Other Findings   Reproductive/Obstetrics negative OB ROS                              Anesthesia Physical Anesthesia Plan  ASA: 3  Anesthesia Plan: General   Post-op Pain Management: Tylenol  PO (pre-op)*   Induction: Intravenous  PONV Risk Score and Plan: 3 and Ondansetron , Dexamethasone  and Treatment may vary due to age or medical condition  Airway Management Planned: Oral ETT  Additional Equipment: Arterial line  Intra-op Plan:   Post-operative Plan: Extubation in OR  Informed Consent: I have reviewed the patients History and Physical, chart, labs and discussed the procedure including the risks, benefits and alternatives for the proposed anesthesia with the patient or authorized representative who has indicated his/her understanding and acceptance.     Dental advisory given  Plan Discussed with: CRNA  Anesthesia Plan Comments:           Anesthesia Quick Evaluation

## 2024-08-14 NOTE — Progress Notes (Signed)
Patient back to the unit.

## 2024-08-14 NOTE — Progress Notes (Signed)
 Patient taken off floor for surgery at 1330. Accompanied with transporter to short stay unit.

## 2024-08-14 NOTE — Anesthesia Procedure Notes (Signed)
 Procedure Name: Intubation Date/Time: 08/14/2024 2:40 PM  Performed by: Hedy Jarred, CRNAPre-anesthesia Checklist: Patient identified, Emergency Drugs available, Suction available and Patient being monitored Patient Re-evaluated:Patient Re-evaluated prior to induction Oxygen Delivery Method: Circle System Utilized Preoxygenation: Pre-oxygenation with 100% oxygen Induction Type: IV induction Ventilation: Mask ventilation without difficulty Laryngoscope Size: Miller and 2 Grade View: Grade I Tube type: Oral Tube size: 8.0 mm Number of attempts: 1 Airway Equipment and Method: Stylet and Oral airway Placement Confirmation: ETT inserted through vocal cords under direct vision, positive ETCO2 and breath sounds checked- equal and bilateral Secured at: 23 cm Tube secured with: Tape Dental Injury: Teeth and Oropharynx as per pre-operative assessment

## 2024-08-15 ENCOUNTER — Encounter (HOSPITAL_COMMUNITY): Payer: Self-pay

## 2024-08-15 DIAGNOSIS — D497 Neoplasm of unspecified behavior of endocrine glands and other parts of nervous system: Secondary | ICD-10-CM | POA: Diagnosis not present

## 2024-08-15 LAB — BASIC METABOLIC PANEL WITH GFR
Anion gap: 10 (ref 5–15)
BUN: 23 mg/dL (ref 8–23)
CO2: 27 mmol/L (ref 22–32)
Calcium: 8.7 mg/dL — ABNORMAL LOW (ref 8.9–10.3)
Chloride: 94 mmol/L — ABNORMAL LOW (ref 98–111)
Creatinine, Ser: 0.86 mg/dL (ref 0.61–1.24)
GFR, Estimated: >60 mL/min (ref >60)
Glucose, Bld: 155 mg/dL — ABNORMAL HIGH (ref 70–99)
Potassium: 4.2 mmol/L (ref 3.5–5.1)
Sodium: 131 mmol/L — ABNORMAL LOW (ref 135–145)

## 2024-08-15 LAB — CBC
HCT: 37.2 % — ABNORMAL LOW (ref 39.0–52.0)
Hemoglobin: 12.7 g/dL — ABNORMAL LOW (ref 13.0–17.0)
MCH: 30 pg (ref 26.0–34.0)
MCHC: 34.1 g/dL (ref 30.0–36.0)
MCV: 87.7 fL (ref 80.0–100.0)
Platelets: 289 K/uL (ref 150–400)
RBC: 4.24 MIL/uL (ref 4.22–5.81)
RDW: 13.2 % (ref 11.5–15.5)
WBC: 10.2 K/uL (ref 4.0–10.5)
nRBC: 0 % (ref 0.0–0.2)

## 2024-08-15 MED ORDER — BISACODYL 10 MG RE SUPP
10.0000 mg | Freq: Every day | RECTAL | Status: AC | PRN
  Administered 2024-08-15 – 2024-08-20 (×2): 10 mg via RECTAL
  Filled 2024-08-15 (×2): qty 1

## 2024-08-15 MED ORDER — DEXAMETHASONE 4 MG PO TABS
2.0000 mg | ORAL_TABLET | Freq: Two times a day (BID) | ORAL | Status: AC
  Administered 2024-08-18 – 2024-08-20 (×6): 2 mg via ORAL
  Filled 2024-08-15 (×6): qty 1

## 2024-08-15 MED ORDER — DEXAMETHASONE 2 MG PO TABS
1.0000 mg | ORAL_TABLET | Freq: Every day | ORAL | Status: DC
  Administered 2024-08-24: 1 mg via ORAL
  Filled 2024-08-15: qty 1

## 2024-08-15 MED ORDER — DEXAMETHASONE 2 MG PO TABS
2.0000 mg | ORAL_TABLET | Freq: Every day | ORAL | Status: AC
  Administered 2024-08-21 – 2024-08-23 (×3): 2 mg via ORAL
  Filled 2024-08-15 (×3): qty 1

## 2024-08-15 MED ADMIN — Dexamethasone Tab 4 MG: 4 mg | ORAL | NDC 72578017101

## 2024-08-15 MED FILL — Thrombin For Soln 5000 Unit: CUTANEOUS | Qty: 5000 | Status: AC

## 2024-08-15 MED FILL — Dexamethasone Tab 4 MG: 4.0000 mg | ORAL | Qty: 1 | Status: AC

## 2024-08-15 MED FILL — Dexamethasone Tab 4 MG: 4.0000 mg | ORAL | Qty: 1 | Status: CN

## 2024-08-15 NOTE — Progress Notes (Signed)
 Inpatient Rehab Admissions:  Inpatient Rehab Consult received.  I met with patient at the bedside for rehabilitation assessment and to discuss goals and expectations of an inpatient rehab admission.  Discussed average length of stay, insurance authorization requirement and discharge home after completion of CIR. Pt acknowledged understanding and is interested in pursuing CIR. Pt gave permission to contact significant other. Spoke with Jinnie on the phone. She also acknowledged understanding. She would like to discuss rehab options with pt prior to making a decision about CIR. Will continue to follow.  Signed: Tinnie Yvone Cohens, MS, CCC-SLP Admissions Coordinator [phone]

## 2024-08-15 NOTE — Progress Notes (Addendum)
 Occupational Therapy Re-Evaluation  Patient Details Name: Dustin [name] MRN: [id number] DOB: [birth year] Today's Date: 08/15/2024   History of present illness 68 yo male presents to [facility] on 10/10 with progressive LE weakness, inability to ambulate, and bowel/bladder changes. 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 CT abd/pelvis shows splenic lesion. fall 10/11, assisted to floor by staff and no injuries sustained. 10/16 S/P resection of intramedullary mass, T1-T3 laminectomy.  PMH includes HTN, HLD, hep C, liver cirrhosis, prior DVT, GI carcinoid tumor, remote history of ETOH use.   OT comments  Pt seen s/p surgery.  Educated on back precautions and provided handout.  He requires +2 mod assist for bed mobility, able to roll with min assist.  Sitting EOB with contact guard but preference to BUE support and stands with mod assist +2.  Pivots to recliner with max assist +2 hand held assist.  Requires contact guard to total assist +2 for ADLs.  Goals remain appropriate. Continue to recommend intensive >3hrs/day inpatient setting at dc.  Will follow acutely.       If plan is discharge home, recommend the following:  Two people to help with walking and/or transfers;Two people to help with bathing/dressing/bathroom;Assistance with cooking/housework;Assist for transportation;Help with stairs or ramp for entrance   Equipment Recommendations  Other (comment) (defer)    Recommendations for Other Services Rehab consult     Precautions / Restrictions Precautions Precautions: Fall;Back Precaution Booklet Issued: Yes (comment) Precaution/Restrictions Comments: reviewed with pt Required Braces or Orthoses:  (no brace needed Per Dr. Darnella 10/17) Restrictions Weight Bearing Restrictions Per Provider Order: No       Mobility Bed Mobility Overal bed mobility: Needs Assistance Bed  Mobility: Rolling, Sidelying to Sit Rolling: Min assist, Used rails Sidelying to sit: Mod assist, +2 for physical assistance, HOB elevated, Used rails       General bed mobility comments: cueing for technique, using log roll to transition to EOB    Transfers Overall transfer level: Needs assistance Equipment used: 2 person hand held assist Transfers: Sit to/from Stand, Bed to chair/wheelchair/BSC Sit to Stand: Mod assist, +2 physical assistance, +2 safety/equipment Stand pivot transfers: Max assist, +2 physical assistance, +2 safety/equipment         General transfer comment: Mod Ax2 for rise with bil knee block and pt holding onto bil therapist's arms, cueing for upright posture.  max A +2 for pivot to R, x2 reps, with pt unable to progress LEs     Balance Overall balance assessment: Needs assistance Sitting-balance support: No upper extremity supported, Feet supported, Bilateral upper extremity supported Sitting balance-Leahy Scale: Fair Sitting balance - Comments: pt preference to 1-2 hand support, but able to sustain sitting balance statically briefly without UE support,  contact guard for safety   Standing balance support: Bilateral upper extremity supported, During functional activity Standing balance-Leahy Scale: Poor Standing balance comment: reliant on bil UE support, bil knee block, and mod-maxAx2 to stand                           ADL either performed or assessed with clinical judgement   ADL Overall ADL's : Needs assistance/impaired     Grooming: Sitting;Contact guard assist Grooming Details (indicate cue type and reason): sitting unsupported         Upper Body Dressing : Contact guard assist;Sitting   Lower  Body Dressing: Total assistance;+2 for physical assistance;Sit to/from stand   Toilet Transfer: Maximal assistance;+2 for physical assistance;Stand-pivot Toilet Transfer Details (indicate cue type and reason): bil hand held support          Functional mobility during ADLs: Moderate assistance;Maximal assistance;+2 for physical assistance      Extremity/Trunk Assessment Upper Extremity Assessment Upper Extremity Assessment: Overall WFL for tasks assessed   Lower Extremity Assessment Lower Extremity Assessment: Defer to PT evaluation   Cervical / Trunk Assessment Cervical / Trunk Assessment: Kyphotic    Vision   Vision Assessment?: No apparent visual deficits   Perception     Praxis     Communication Communication Communication: No apparent difficulties   Cognition Arousal: Alert Behavior During Therapy: WFL for tasks assessed/performed Cognition: No apparent impairments                               Following commands: Intact        Cueing   Cueing Techniques: Gestural cues, Verbal cues, Tactile cues  Exercises      Shoulder Instructions       General Comments      Pertinent Vitals/ Pain       Pain Assessment Pain Assessment: 0-10 Pain Score: 8  Pain Location: surgical site Pain Descriptors / Indicators: Sore, Discomfort, Grimacing, Operative site guarding Pain Intervention(s): Limited activity within patient's tolerance, Monitored during session, Premedicated before session, Repositioned  Home Living Family/patient expects to be discharged to:: Private residence Living Arrangements: Spouse/significant other Available Help at Discharge: Family Type of Home: Apartment Home Access: Level entry     Home Layout: One level     Bathroom Shower/Tub: Producer, television/film/video: Standard     Home Equipment: Medical laboratory scientific officer - single point   Additional Comments: very old RW in his outbuilding closet, needs a new one      Prior Functioning/Environment              Frequency  Min 2X/week        Progress Toward Goals  OT Goals(current goals can now be found in the care plan section)     Acute Rehab OT Goals Patient Stated Goal: get better OT Goal Formulation: With  patient Time For Goal Achievement: 08/27/24 Potential to Achieve Goals: Good  Plan      Co-evaluation    PT/OT/SLP Co-Evaluation/Treatment: Yes Reason for Co-Treatment: Complexity of the patient's impairments (multi-system involvement);For patient/therapist safety;To address functional/ADL transfers PT goals addressed during session: Mobility/safety with mobility;Strengthening/ROM;Balance OT goals addressed during session: ADL's and self-care;Strengthening/ROM      AM-PAC OT 6 Clicks Daily Activity     Outcome Measure   Help from another person eating meals?: None Help from another person taking care of personal grooming?: A Little Help from another person toileting, which includes using toliet, bedpan, or urinal?: Total Help from another person bathing (including washing, rinsing, drying)?: A Lot Help from another person to put on and taking off regular upper body clothing?: A Little Help from another person to put on and taking off regular lower body clothing?: Total 6 Click Score: 14    End of Session Equipment Utilized During Treatment: Gait belt  OT Visit Diagnosis: Unsteadiness on feet (R26.81);Muscle weakness (generalized) (M62.81)   Activity Tolerance Patient tolerated treatment well   Patient Left in chair;with call bell/phone within reach;with chair alarm set   Nurse Communication Mobility status;Precautions  Time: 8970-8948 OT Time Calculation (min): 22 min  Charges: OT General Charges $OT Visit: 1 Visit OT Evaluation $OT Re-eval: 1 Re-eval  Etta NOVAK, OT Acute Rehabilitation Services Office [phone] Secure Chat Preferred    Etta GORMAN Hope 08/15/2024, 11:01 AM

## 2024-08-15 NOTE — Progress Notes (Signed)
 Inpatient Rehab Admissions Coordinator Note:   Per therapy recs patient was screened for CIR candidacy by Reche FORBES Lowers, PT. At this time, pt appears to be a potential candidate for CIR. I will place an order for rehab consult for full assessment, per our protocol.  Please contact me any with questions.SABRA Reche Lowers, PT, DPT [phone] 08/15/24 1:53 PM

## 2024-08-15 NOTE — Progress Notes (Signed)
  Progress Note   Patient: Dustin [name] FMW:[id number] DOB: Oct 16, 1956 DOA: 08/08/2024     7 DOS: the patient was seen and examined on 08/15/2024        Brief hospital course: 68 y.o. M with HTN, compensated hep C cirrhosis, hx VTE on Eliquis , and hx NET in 2017 who presented with paresthesias, found to have thoracic spine lesion.    Radiographic appearance has wide differential.  NSGY consulted and recommended operative resection, planned for 10/16.     Assessment and Plan: T2 intramedullary spinal lesion Status post intramedullary mass resection and laminectomy on 10/16 by Dr. Darnella See prior note from 10/16 -Dexamethasone  taper per neurosurgery   Hypertension BP normal  - Continue home amlodipine , carvedilol , HCTZ   Hyperlipidemia - Continue atorvastatin    History of DVT - Resume Eliquis  as soon as reasonably safe from surgery perspective   History of hep C cirrhosis Compensated   Benign neuroendocrine tumor of the stomach See prior note   Hyponatremia Mild, asymptomatic - Monitor trend       Subjective: Patient is doing okay, he said no change in his lower extremity weakness.  Upper extremity strength good.  Pain is expected.     Physical Exam: BP 131/83 (BP Location: Right Arm)   Pulse 76   Temp 97.9 F (36.6 C) (Oral)   Resp 17   Ht 5' 11 (1.803 m)   Wt 95.3 kg   SpO2 97%   BMI 29.30 kg/m   Adult male, lying in bed, interactive and appropriate RRR, no murmurs, no peripheral edema Respiratory rate normal, lungs clear without rales or wheezes Face metric, speech fluent, oriented x 4.  Upper extremity strength 5/5 and symmetric Lower extremity strength seems reduced, similar to yesterday, seems bilateral.    Data Reviewed: Basic metabolic panel shows mild hyponatremia Hemoglobin on CBC is stable, no leukocytosis    Family Communication:     Disposition: Status is: Inpatient         Author: [name],  MD 08/15/2024 7:34 AM  For on call review [email or web address].

## 2024-08-15 NOTE — PMR Pre-admission (Shared)
 PMR Admission Coordinator Pre-Admission Assessment  Patient: Dustin [name] is an 68 y.o., male MRN: [id number] DOB: 03-Feb-1956 Height: 5' 11 (180.3 cm) Weight: 95.3 kg  Insurance Information HMO: ***    PPO: ***     PCP:      IPA:      80/20:      OTHER:  PRIMARY: [employer] Dual Complete      Policy#: [id number]      Subscriber: patient CM Name: ***      Phone#: ***     Fax#: *** Pre-Cert#: ***      Employer: *** Benefits:  Phone #: ***     Name: *** Eustacio. Date: ***     Deduct: ***      Out of Pocket Max: ***      Life Max: *** CIR: ***      SNF: *** Outpatient: ***     Co-Pay: *** Home Health: ***      Co-Pay: *** DME: ***     Co-Pay: *** Providers: in-network SECONDARY:       Policy#:      Phone#:   Financial Counselor:       Phone#:   The Data processing manager" for patients in Inpatient Rehabilitation Facilities with attached "Privacy Act Statement-Health Care Records" was provided and verbally reviewed with: Patient  Emergency Contact Information Contact Information     Name Relation Home Work Mobile   [place] Sister [phone]        Other Contacts   None on File     Current Medical History  Patient Admitting Diagnosis: spinal cord neoplasm, s/p resection of intramedullary mass, 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  Past Medical History  Past Medical History:  Diagnosis Date   Arthritis    bil knees, left hand   Atrial fibrillation (HCC)    Carcinoid tumor (HCC)    DVT of upper extremity (deep vein thrombosis) (HCC)    left brachial vein, 12/2010   Helicobacter pylori gastritis    History of blood clots    to left arm   Hypertension    Shortness of breath dyspnea    with exertion   Tubular adenoma of colon 2017    Has the patient had major surgery during 100 days prior to admission? Yes  Family  History   family history includes Cancer in his father and mother; Diabetes in his maternal uncle and another family member; Hypertension in an other family member.  Current Medications  Current Facility-Administered Medications:    acetaminophen  (TYLENOL ) tablet 650 mg, 650 mg, Oral, Q6H PRN, 650 mg at 08/15/24 0630 **OR** acetaminophen  (TYLENOL ) suppository 650 mg, 650 mg, Rectal, Q6H PRN, Darnella Dorn JONELLE, MD   albuterol  (PROVENTIL ) (2.5 MG/3ML) 0.083% nebulizer solution 2.5 mg, 2.5 mg, Nebulization, Q6H PRN, Darnella Dorn JONELLE, MD   amLODipine  (NORVASC ) tablet 10 mg, 10 mg, Oral, Daily, Garst, Jonathan R, MD, 10 mg at 08/15/24 0835   atorvastatin  (LIPITOR) tablet 20 mg, 20 mg, Oral, Daily, Garst, Jonathan R, MD, 20 mg at 08/15/24 9164   bisacodyl  (DULCOLAX) suppository 10 mg, 10 mg, Rectal, Daily PRN, Chavez, Abigail, NP, 10 mg at 08/15/24 0631   carvedilol  (COREG ) tablet 6.25 mg, 6.25 mg, Oral, BID WC, Garst, Jonathan R, MD, 6.25 mg at 08/15/24 1648   Chlorhexidine  Gluconate Cloth 2 % PADS 6 each, 6 each,  Topical, Daily, Darnella Dorn SAUNDERS, MD, 6 each at 08/14/24 1000   Chlorhexidine  Gluconate Cloth 2 % PADS 6 each, 6 each, Topical, Daily, Darnella Dorn SAUNDERS, MD, 6 each at 08/15/24 1215   cyclobenzaprine (FLEXERIL) tablet 5 mg, 5 mg, Oral, TID, Garst, Jonathan R, MD, 5 mg at 08/15/24 1534   dexamethasone  (DECADRON ) tablet 4 mg, 4 mg, Oral, Q12H **FOLLOWED BY** [START ON 08/18/2024] dexamethasone  (DECADRON ) tablet 2 mg, 2 mg, Oral, Q12H **FOLLOWED BY** [START ON 08/21/2024] dexamethasone  (DECADRON ) tablet 2 mg, 2 mg, Oral, Daily **FOLLOWED BY** [START ON 08/24/2024] dexamethasone  (DECADRON ) tablet 1 mg, 1 mg, Oral, Daily, Darnella, Jonathan R, MD   hydrochlorothiazide  (HYDRODIURIL ) tablet 25 mg, 25 mg, Oral, Daily, Garst, Jonathan R, MD, 25 mg at 08/15/24 9164   HYDROmorphone  (DILAUDID ) injection 0.5 mg, 0.5 mg, Intravenous, Q2H PRN, Darnella Dorn SAUNDERS, MD, 0.5 mg at 08/15/24 1534   methocarbamol   (ROBAXIN ) tablet 750 mg, 750 mg, Oral, Q6H PRN, Garst, Jonathan R, MD, 750 mg at 08/15/24 1217   mupirocin  ointment (BACTROBAN ) 2 % 1 Application, 1 Application, Nasal, BID, Garst, Jonathan R, MD, 1 Application at 08/15/24 0836   oxyCODONE -acetaminophen  (PERCOCET) 7.5-325 MG per tablet 1 tablet, 1 tablet, Oral, Q4H PRN, Darnella Dorn SAUNDERS, MD, 1 tablet at 08/15/24 1648   pantoprazole (PROTONIX) EC tablet 40 mg, 40 mg, Oral, Daily, Garst, Jonathan R, MD, 40 mg at 08/15/24 0835   polyethylene glycol (MIRALAX  / GLYCOLAX ) packet 17 g, 17 g, Oral, Daily PRN, Darnella Dorn SAUNDERS, MD, 17 g at 08/15/24 0836   sodium chloride  flush (NS) 0.9 % injection 3 mL, 3 mL, Intravenous, Q12H, Darnella Dorn SAUNDERS, MD, 3 mL at 08/15/24 0836   vasopressin (PITRESSIN) 20 Units in 100 mL (0.2 unit/mL) infusion-*FOR SHOCK*, 0-0.03 Units/min, Intravenous, Continuous, Garst, Dorn SAUNDERS, MD  Patients Current Diet:  Diet Order             Diet regular Room service appropriate? Yes; Fluid consistency: Thin  Diet effective now                   Precautions / Restrictions Precautions Precautions: Fall, Back Precaution Booklet Issued: Yes (comment) Precaution/Restrictions Comments: reviewed with pt Restrictions Weight Bearing Restrictions Per Provider Order: No   Has the patient had 2 or more falls or a fall with injury in the past year? No  Prior Activity Level Community (5-7x/wk): gets out of house ~4 days/week  Prior Functional Level Self Care: Did the patient need help bathing, dressing, using the toilet or eating? Independent  Indoor Mobility: Did the patient need assistance with walking from room to room (with or without device)? Independent  Stairs: Did the patient need assistance with internal or external stairs (with or without device)? Independent  Functional Cognition: Did the patient need help planning regular tasks such as shopping or remembering to take medications? Independent  Patient  Information Are you of Hispanic, Latino/a,or Spanish origin?: A. No, not of Hispanic, Latino/a, or Spanish origin What is your race?: B. Black or African American Do you need or want an interpreter to communicate with a doctor or health care staff?: 0. No  Patient's Response To:  Health Literacy and Transportation Is the patient able to respond to health literacy and transportation needs?: Yes Health Literacy - How often do you need to have someone help you when you read instructions, pamphlets, or other written material from your doctor or pharmacy?: Never In the past 12 months, has lack of transportation kept you  from medical appointments or from getting medications?: No In the past 12 months, has lack of transportation kept you from meetings, work, or from getting things needed for daily living?: No  Home Assistive Devices / Equipment Home Equipment: [place] - single point  Prior Device Use: Indicate devices/aids used by the patient prior to current illness, exacerbation or injury? None of the above  Current Functional Level Cognition  Orientation Level: Oriented X4    Extremity Assessment (includes Sensation/Coordination)  Upper Extremity Assessment: Defer to OT evaluation  Lower Extremity Assessment: LLE deficits/detail, RLE deficits/detail RLE Deficits / Details: MMT scores of 2+ hip flexion, 3 knee extension; no sensation to light touch in foot, diminished superior to this and intact at thigh; no clonus noted RLE Sensation: decreased light touch, decreased proprioception RLE Coordination: decreased gross motor, decreased fine motor (incoordination noted during LAQ) LLE Deficits / Details: MMT scores of 2+ hip flexion, 2+ knee extension, minimal ankle movement noted; no sensation to light touch at foot, diminished throughout rest of leg, sensation worse on L than on R; >5 beat clonus noted LLE Sensation: decreased light touch, decreased proprioception    ADLs  Overall ADL's : Needs  assistance/impaired Eating/Feeding: Bed level, Modified independent Grooming: Sitting, Contact guard assist Grooming Details (indicate cue type and reason): sitting unsupported Upper Body Bathing: Minimal assistance, Sitting Lower Body Bathing: Total assistance, +2 for physical assistance, +2 for safety/equipment, Sitting/lateral leans, Sit to/from stand Upper Body Dressing : Contact guard assist, Sitting Lower Body Dressing: Total assistance, +2 for physical assistance, Sit to/from stand Toilet Transfer: Maximal assistance, +2 for physical assistance, Stand-pivot Toilet Transfer Details (indicate cue type and reason): bil hand held support Functional mobility during ADLs: Moderate assistance, Maximal assistance, +2 for physical assistance    Mobility  Overal bed mobility: Needs Assistance Bed Mobility: Rolling, Sidelying to Sit Rolling: Min assist, Used rails Sidelying to sit: Mod assist, +2 for physical assistance, HOB elevated, Used rails Supine to sit: Mod assist, +2 for physical assistance, +2 for safety/equipment General bed mobility comments: cueing for technique, using log roll to transition to EOB. MinA at trunk to roll. ModAx2 to manage legs off EOB and ascend trunk, cuing pt to get arms under him to push up, poor initiation to move legs noted.    Transfers  Overall transfer level: Needs assistance Equipment used: 2 person hand held assist Transfers: Sit to/from Stand, Bed to chair/wheelchair/BSC Sit to Stand: Mod assist, +2 physical assistance, +2 safety/equipment Bed to/from chair/wheelchair/BSC transfer type:: Stand pivot, Step pivot Stand pivot transfers: Max assist, +2 physical assistance, +2 safety/equipment Step pivot transfers: Max assist, +2 physical assistance, +2 safety/equipment Transfer via Lift Equipment: Stedy General transfer comment: Mod Ax2 for rise with bil knee block and pt holding onto bil therapist's arms, cueing for upright posture, x2 reps from EOB.  max  A +2 for pivot to R 1x and step pivot to R 1x, needing max cues to extend hips and knees, shift weight to L to allow R leg to step to R, needing assistance to move R leg once he minimally lifted it.    Ambulation / Gait / Stairs / Wheelchair Mobility  Ambulation/Gait Ambulation/Gait assistance: Max assist, +2 physical assistance, +2 safety/equipment Gait Distance (Feet): 1 Feet Assistive device: 2 person hand held assist Gait Pattern/deviations: Step-to pattern, Decreased step length - right, Decreased stride length, Decreased dorsiflexion - right, Decreased weight shift to left, Knees buckling, Trunk flexed General Gait Details: Pt needed cues to extend knees and  stick out abdomen to extend hips to stand upright. Max cues to shift weight to L leg to allow  R leg to lift with pt minimally lifting then needing assistance to advance R foot to step to R 1x. MaxAx2 bil HHA to balance and assist pt to step. Bil knees blocked during stance phase due to noted buckling. Gait velocity: reduced Gait velocity interpretation: <1.31 ft/sec, indicative of household ambulator    Posture / Balance Dynamic Sitting Balance Sitting balance - Comments: pt preference to 1-2 hand support, but able to sustain sitting balance statically briefly without UE support,  contact guard for safety Balance Overall balance assessment: Needs assistance Sitting-balance support: No upper extremity supported, Feet supported, Bilateral upper extremity supported Sitting balance-Leahy Scale: Fair Sitting balance - Comments: pt preference to 1-2 hand support, but able to sustain sitting balance statically briefly without UE support,  contact guard for safety Standing balance support: Bilateral upper extremity supported, During functional activity Standing balance-Leahy Scale: Poor Standing balance comment: reliant on bil UE support, bil knee block, and mod-maxAx2 to stand    Special considerations/life events  Continuous Drip IV  ***  Urethral Catheter and Skin Surgical Incision: back   Previous Home Environment (from acute therapy documentation) Living Arrangements: Alone  Lives With: Alone Available Help at Discharge: Family Type of Home: Apartment Home Layout: One level Home Access: Level entry Bathroom Shower/Tub: Engineer, manufacturing systems: Standard Bathroom Accessibility: Yes How Accessible: Accessible via walker Home Care Services: No Additional Comments: very old RW in his outbuilding closet, needs a new one  Discharge Living Setting Plans for Discharge Living Setting: Patient's home Type of Home at Discharge: Apartment Discharge Home Layout: One level Discharge Home Access: Level entry Discharge Bathroom Shower/Tub: Tub/shower unit Discharge Bathroom Toilet: Standard Discharge Bathroom Accessibility: Yes How Accessible: Accessible via walker  Social/Family/Support Systems    Goals Patient/Family Goal for Rehab: *** Expected length of stay: ***  Decrease burden of Care through IP rehab admission: NA  Possible need for SNF placement upon discharge: Not anticipated  Patient Condition: {PATIENT'S CONDITION:22832}  Preadmission Screen Completed By:  Tinnie SHAUNNA Yvone Delayne, 08/15/2024 4:56 PM ______________________________________________________________________   Discussed status with Dr. PIERRETTE on *** at *** and received approval for admission today.  Admission Coordinator:  Tinnie SHAUNNA Yvone Delayne, CCC-SLP, time ***/Date ***   Assessment/Plan: Diagnosis: *** Does the need for close, 24 hr/day Medical supervision in concert with the patient's rehab needs make it unreasonable for this patient to be served in a less intensive setting? {yes_no_potentially:3041433} Co-Morbidities requiring supervision/potential complications: *** Due to {due un:6958565}, does the patient require 24 hr/day rehab nursing? {yes_no_potentially:3041433} Does the patient require coordinated care of a physician, rehab  nurse, PT, OT, and SLP to address physical and functional deficits in the context of the above medical diagnosis(es)? {yes_no_potentially:3041433} Addressing deficits in the following areas: {deficits:3041436} Can the patient actively participate in an intensive therapy program of at least 3 hrs of therapy 5 days a week? {yes_no_potentially:3041433} The potential for patient to make measurable gains while on inpatient rehab is {potential:3041437} Anticipated functional outcomes upon discharge from inpatient rehab: {functional outcomes:304600100} PT, {functional outcomes:304600100} OT, {functional outcomes:304600100} SLP Estimated rehab length of stay to reach the above functional goals is: *** Anticipated discharge destination: {anticipated dc setting:21604} 10. Overall Rehab/Functional Prognosis: {potential:3041437}   MD Signature: ***

## 2024-08-15 NOTE — Evaluation (Signed)
 Physical Therapy Re-Evaluation Patient Details Name: Dustin [name] MRN: [id number] DOB: 1955-11-17 Today's Date: 08/15/2024  History of Present Illness  68 yo male presents to [facility] on 10/10 with progressive LE weakness, inability to ambulate, and bowel/bladder changes. 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 CT abd/pelvis shows splenic lesion. fall 10/11, assisted to floor by staff and no injuries sustained. 10/16 S/P resection of intramedullary mass, T1-T3 laminectomy 10/16.  PMH includes HTN, HLD, hep C, liver cirrhosis, prior DVT, GI carcinoid tumor, remote history of ETOH use.   Clinical Impression  The pt is now s/p resection of intramedullary mass, T1-T3 laminectomy 10/16. He reports his strength in his bil legs appears to already be improving since surgery, which was evident with his improved ability to lift his legs to march in sitting and perform a LAQ. However, he remains very weak in bil legs and has continued proprioception and sensation deficits in bil legs, L worse than R. He required minA to roll, modAx2 to transition sidelying to sit and transfer sit to stand, and maxAx2 to stand/step pivot to the R with bil HHA and bil knees blocked due to noted buckling. Pt needed cues to simultaneously extend his knees and stick out his abdomen to facilitate hip extension when standing. He remains at high risk for falls. Current recommendations remain appropriate. Will continue to follow acutely.      If plan is discharge home, recommend the following: Help with stairs or ramp for entrance;Two people to help with walking and/or transfers;Two people to help with bathing/dressing/bathroom;Assistance with cooking/housework;Assist for transportation   Can travel by private vehicle        Equipment Recommendations Other (comment) (TBD)  Recommendations for Other Services   Rehab consult    Functional Status Assessment Patient has had a recent decline in their functional status and demonstrates the ability to make significant improvements in function in a reasonable and predictable amount of time.     Precautions / Restrictions Precautions Precautions: Fall;Back Precaution Booklet Issued: Yes (comment) Precaution/Restrictions Comments: reviewed with pt Required Braces or Orthoses:  (no brace needed Per Dr. Darnella 10/17) Restrictions Weight Bearing Restrictions Per Provider Order: No      Mobility  Bed Mobility Overal bed mobility: Needs Assistance Bed Mobility: Rolling, Sidelying to Sit Rolling: Min assist, Used rails Sidelying to sit: Mod assist, +2 for physical assistance, HOB elevated, Used rails       General bed mobility comments: cueing for technique, using log roll to transition to EOB. MinA at trunk to roll. ModAx2 to manage legs off EOB and ascend trunk, cuing pt to get arms under him to push up, poor initiation to move legs noted.    Transfers Overall transfer level: Needs assistance Equipment used: 2 person hand held assist Transfers: Sit to/from Stand, Bed to chair/wheelchair/BSC Sit to Stand: Mod assist, +2 physical assistance, +2 safety/equipment Stand pivot transfers: Max assist, +2 physical assistance, +2 safety/equipment Step pivot transfers: Max assist, +2 physical assistance, +2 safety/equipment       General transfer comment: Mod Ax2 for rise with bil knee block and pt holding onto bil therapist's arms, cueing for upright posture, x2 reps from EOB.  max A +2 for pivot to R 1x and step pivot to R 1x, needing max cues to extend hips and knees, shift weight to L to allow R leg to step to R, needing  assistance to move R leg once he minimally lifted it.    Ambulation/Gait Ambulation/Gait assistance: Max assist, +2 physical assistance, +2 safety/equipment Gait Distance (Feet): 1 Feet Assistive device: 2 person hand held  assist Gait Pattern/deviations: Step-to pattern, Decreased step length - right, Decreased stride length, Decreased dorsiflexion - right, Decreased weight shift to left, Knees buckling, Trunk flexed Gait velocity: reduced Gait velocity interpretation: <1.31 ft/sec, indicative of household ambulator   General Gait Details: Pt needed cues to extend knees and stick out abdomen to extend hips to stand upright. Max cues to shift weight to L leg to allow  R leg to lift with pt minimally lifting then needing assistance to advance R foot to step to R 1x. MaxAx2 bil HHA to balance and assist pt to step. Bil knees blocked during stance phase due to noted buckling.  Stairs            Wheelchair Mobility     Tilt Bed    Modified Rankin (Stroke Patients Only)       Balance Overall balance assessment: Needs assistance Sitting-balance support: No upper extremity supported, Feet supported, Bilateral upper extremity supported Sitting balance-Leahy Scale: Fair Sitting balance - Comments: pt preference to 1-2 hand support, but able to sustain sitting balance statically briefly without UE support,  contact guard for safety   Standing balance support: Bilateral upper extremity supported, During functional activity Standing balance-Leahy Scale: Poor Standing balance comment: reliant on bil UE support, bil knee block, and mod-maxAx2 to stand                             Pertinent Vitals/Pain Pain Assessment Pain Assessment: Faces Faces Pain Scale: Hurts even more Pain Location: surgical site Pain Descriptors / Indicators: Sore, Discomfort, Grimacing, Operative site guarding Pain Intervention(s): Limited activity within patient's tolerance, Monitored during session, Premedicated before session, Repositioned    Home Living Family/patient expects to be discharged to:: Private residence Living Arrangements: Spouse/significant other Available Help at Discharge: Family Type of Home:  Apartment Home Access: Level entry       Home Layout: One level Home Equipment: Cane - single point Additional Comments: very old RW in his outbuilding closet, needs a new one    Prior Function Prior Level of Function : Independent/Modified Independent;History of Falls (last six months)             Mobility Comments: prior to 1 week PTA, pt independent with ADLs and mobility. Progressive weakness x1 week PTA with inability to walk       Extremity/Trunk Assessment   Upper Extremity Assessment Upper Extremity Assessment: Defer to OT evaluation    Lower Extremity Assessment Lower Extremity Assessment: LLE deficits/detail;RLE deficits/detail RLE Deficits / Details: MMT scores of 2+ hip flexion, 3 knee extension; no sensation to light touch in foot, diminished superior to this and intact at thigh; no clonus noted RLE Sensation: decreased light touch;decreased proprioception LLE Deficits / Details: MMT scores of 2+ hip flexion, 2+ knee extension, minimal ankle movement noted; no sensation to light touch at foot, diminished throughout rest of leg, sensation worse on L than on R; >5 beat clonus noted LLE Sensation: decreased light touch;decreased proprioception    Cervical / Trunk Assessment Cervical / Trunk Assessment: Kyphotic;Back Surgery  Communication   Communication Communication: No apparent difficulties    Cognition Arousal: Alert Behavior During Therapy: WFL for tasks assessed/performed   PT - Cognitive impairments: Safety/Judgement, Problem solving, Memory, Sequencing  PT - Cognition Comments: at times requires repeated cues and safety cues Following commands: Intact       Cueing Cueing Techniques: Gestural cues, Verbal cues, Tactile cues     General Comments      Exercises     Assessment/Plan    PT Assessment Patient needs continued PT services  PT Problem List Decreased strength;Decreased mobility;Decreased activity  tolerance;Decreased balance;Decreased knowledge of use of DME;Pain;Decreased safety awareness;Impaired sensation       PT Treatment Interventions DME instruction;Therapeutic activities;Gait training;Therapeutic exercise;Patient/family education;Balance training;Stair training;Functional mobility training;Neuromuscular re-education;Wheelchair mobility training    PT Goals (Current goals can be found in the Care Plan section)  Acute Rehab PT Goals Patient Stated Goal: to improve PT Goal Formulation: With patient Time For Goal Achievement: 08/29/24 Potential to Achieve Goals: Good    Frequency Min 3X/week     Co-evaluation   Reason for Co-Treatment: Complexity of the patient's impairments (multi-system involvement);For patient/therapist safety;To address functional/ADL transfers PT goals addressed during session: Mobility/safety with mobility;Strengthening/ROM;Balance OT goals addressed during session: ADL's and self-care;Strengthening/ROM       AM-PAC PT 6 Clicks Mobility  Outcome Measure Help needed turning from your back to your side while in a flat bed without using bedrails?: A Little Help needed moving from lying on your back to sitting on the side of a flat bed without using bedrails?: Total Help needed moving to and from a bed to a chair (including a wheelchair)?: Total Help needed standing up from a chair using your arms (e.g., wheelchair or bedside chair)?: Total Help needed to walk in hospital room?: Total Help needed climbing 3-5 steps with a railing? : Total 6 Click Score: 8    End of Session Equipment Utilized During Treatment: Gait belt Activity Tolerance: Patient tolerated treatment well Patient left: in chair;with call bell/phone within reach;with chair alarm set   PT Visit Diagnosis: Other abnormalities of gait and mobility (R26.89);Muscle weakness (generalized) (M62.81);Unsteadiness on feet (R26.81);Difficulty in walking, not elsewhere classified  (R26.2);Other symptoms and signs involving the nervous system (R29.898)    Time: 8970-8948 PT Time Calculation (min) (ACUTE ONLY): 22 min   Charges:   PT Evaluation $PT Re-evaluation: 1 Re-eval   PT General Charges $$ ACUTE PT VISIT: 1 Visit         Theo Ferretti, PT, DPT Acute Rehabilitation Services  Office: [phone]   Theo CHRISTELLA Ferretti 08/15/2024, 3:21 PM

## 2024-08-16 DIAGNOSIS — D497 Neoplasm of unspecified behavior of endocrine glands and other parts of nervous system: Secondary | ICD-10-CM | POA: Diagnosis not present

## 2024-08-16 MED ADMIN — Dexamethasone Tab 4 MG: 4 mg | ORAL | NDC 72578017101

## 2024-08-16 NOTE — Progress Notes (Signed)
 Subjective: The patient is alert and pleasant.  He is in no apparent distress.  Objective: Vital signs in last 24 hours: Temp:  [97.6 F (36.4 C)-98.3 F (36.8 C)] 98.3 F (36.8 C) (10/18 0756) Pulse Rate:  [69-89] 85 (10/18 0526) Resp:  [12-20] 16 (10/18 0526) BP: (126-145)/(72-87) 129/73 (10/18 0526) SpO2:  [95 %-97 %] 95 % (10/18 0526) Estimated body mass index is 29.3 kg/m as calculated from the following:   Height as of this encounter: 5' 11 (1.803 m).   Weight as of this encounter: 95.3 kg.   Intake/Output from previous day: 10/17 0701 - 10/18 0700 In: 240 [P.O.:240] Out: 2275 [Urine:2275] Intake/Output this shift: No intake/output data recorded.  Physical exam the patient is alert and pleasant.  His distal strength seems fairly normal in his right lower extremity.  He has 1-2/5 left gastrocnemius strength which has improved since yesterday.  Lab Results: Recent Labs    08/13/24 1950 08/14/24 1652 08/15/24 0519  WBC 12.0*  --  10.2  HGB 15.7 12.9* 12.7*  HCT 45.2 38.0* 37.2*  PLT 341  --  289   BMET Recent Labs    08/14/24 1652 08/15/24 0519  NA 131* 131*  K 4.1 4.2  CL  --  94*  CO2  --  27  GLUCOSE  --  155*  BUN  --  23  CREATININE  --  0.86  CALCIUM   --  8.7*    Studies/Results: MR THORACIC SPINE W WO CONTRAST Result Date: 08/15/2024 EXAM: MRI THORACIC SPINE WITH AND WITHOUT INTRAVENOUS CONTRAST 08/14/2024 11:40:05 PM TECHNIQUE: Multiplanar multisequence MRI of the thoracic spine was performed with and without the administration of intravenous contrast. COMPARISON: MRI of the thoracic spine dated 08/08/2024. CLINICAL HISTORY: Brain/CNS neoplasm, monitor. FINDINGS: BONES AND ALIGNMENT: Normal alignment. Normal vertebral body heights. Bone marrow signal is unremarkable, except for a hemangioma again noted within the L1 vertebral body. No abnormal enhancement. Since the previous study, the patient has undergone bilateral laminectomies at T2 and  bilateral laminotomies at T1 and T3 for resection of an enhancing intramedullary lesion at the T2 vertebral body level. SPINAL CORD: The previously noted enhancing lesion at T1-T2 is no longer appreciated. There is persistent abnormal T2 signal present within the spinal cord extending from C6-C7 to the T8 vertebral body level. SOFT TISSUES: Unremarkable. DEGENERATIVE CHANGES: No significant disc herniation. No spinal canal stenosis or neural foraminal narrowing. IMPRESSION: 1. Status post bilateral laminectomies at T2 and bilateral laminotomies at T1 and T3 for resection of an enhancing intramedullary lesion at the T1-2 vertebral body level. 2. Persistent abnormal T2 signal within the spinal cord extending from C6-7 to the T8 vertebral body level. No residual enhancing lesion at T1-2. Electronically signed by: Evalene Coho MD 08/15/2024 04:58 AM EDT RP Workstation: [id number]   DG Thoracic Spine 2 View Result Date: 08/14/2024 EXAM: FLUOROSCOPIC IMAGES, 2 VIEWS TECHNIQUE: Fluoroscopy was provided by the radiology department for procedure. Radiologist was not present during examination. FLUOROSCOPY DOSE AND TYPE: Radiation Dose Index: Reference Air Kerma (in mGy) = 1.5 Fluoroscopy Time (in seconds) = 7.7 COMPARISON: None available. CLINICAL HISTORY: [id number] Surgery, elective [id number]. Dr. Darnella; T1-T3 LAM FOR INTRADURAL MASS ; RSTO : CMP, HH; Fl time 7.7 seconds; mGy: 1.5 FINDINGS: Intraoperative fluoroscopic imaging during T1-3 laminectomy for mass resection. IMPRESSION: 1. Intraoperative fluoroscopic spot images, as above. NOTE: Intraoperative fluoroscopic spot images as above. Please refer to the intraoperative report for full details. Electronically signed by: Pinkie Pebbles MD 08/14/2024 08:49  PM EDT RP Workstation: [id number]   DG C-Arm 1-60 Min-No Report Result Date: 08/14/2024 Fluoroscopy was utilized by the requesting physician.  No radiographic interpretation.   DG C-Arm 1-60 Min-No  Report Result Date: 08/14/2024 Fluoroscopy was utilized by the requesting physician.  No radiographic interpretation.   DG C-Arm 1-60 Min-No Report Result Date: 08/14/2024 Fluoroscopy was utilized by the requesting physician.  No radiographic interpretation.   US  Intraoperative Result Date: 08/14/2024 CLINICAL DATA:  Ultrasound was provided for use by the ordering physician.  No provider Interpretation or professional fees incurred.     Assessment/Plan: Status post resection of spinal cord tumor: The patient is getting stronger.  We are awaiting pathology.  LOS: 8 days     Dustin [name] 08/16/2024, 9:09 AM     Patient ID: Dustin [name] Agent, male   DOB: Apr 28, 1956, 68 y.o.   MRN: [id number]

## 2024-08-16 NOTE — Progress Notes (Signed)
  Progress Note   Patient: Dustin [name] FMW:[id number] DOB: [birth year] DOA: 08/08/2024     8 DOS: the patient was seen and examined on 08/16/2024 at 10:40 AM      Brief hospital course: 68 y.o. M with HTN, compensated hep C cirrhosis, hx VTE on Eliquis , and hx NET in 2017 who presented with paresthesias, found to have thoracic spine lesion.    Radiographic appearance has wide differential.  NSGY consulted and recommended operative resection, planned for 10/16.     Assessment and Plan: T2 intramedullary spinal lesion Status post intramedullary mass resection and laminectomy on 10/16 by Dr. Darnella See prior note from 10/16 -Dexamethasone  taper     Hypertension Blood pressure slightly elevated - Continue home amlodipine , carvedilol , HCTZ   Hyperlipidemia - Continue Lipitor   History of DVT -DVT prophylaxis starting on the 19th - Resume Eliquis  on the 30th   History of hep C cirrhosis Compensated   Benign neuroendocrine tumor of the stomach See prior note   Hyponatremia Mild, asymptomatic            Subjective: Patient is doing well, no new complaints, no nursing concerns     Physical Exam: BP (!) 154/80 (BP Location: Right Arm)   Pulse 74   Temp 97.6 F (36.4 C) (Oral)   Resp 16   Ht 5' 11 (1.803 m)   Wt 95.3 kg   SpO2 96%   BMI 29.30 kg/m   Adult male, lying in bed, interactive and appropriate RRR, no murmurs, no peripheral edema Respiratory rate normal, lungs clear without rales or wheezes Face metric, speech fluent, oriented x 4.  Upper extremity strength 5/5 and symmetric Lower extremity strength seems reduced, similar to yesterday, seems bilateral.  Data Reviewed: No new labs    Family Communication:     Disposition: Status is: Inpatient To the best of my knowledge, the above diagnoses best describe the patient's illness, and all expected diagnostic testing that requires acute inpatient care has been completed.  At this point,  the patient is medically ready to transition to an outpatient treatment plan, but because of their diminished functional status from baseline, age, and spinal surgery, it is my medical opinion that discharge home at this time would pose a high risk unnecessary harm.  Safe disposition is being sought, and the patient will be ready to transition to that setting as soon as arranged.         Author: [name], MD 08/16/2024 5:27 PM  For on call review [email or web address].

## 2024-08-16 NOTE — Plan of Care (Signed)
  Problem: Coping: Goal: Level of anxiety will decrease Outcome: Progressing   Problem: Pain Managment: Goal: General experience of comfort will improve and/or be controlled Outcome: Progressing   Problem: Safety: Goal: Ability to remain free from injury will improve Outcome: Progressing   Problem: Skin Integrity: Goal: Risk for impaired skin integrity will decrease Outcome: Progressing

## 2024-08-16 NOTE — TOC Progression Note (Signed)
 Transition of Care [facility]) - Progression Note    Patient Details  Name: Dustin [name] MRN: [id number] Date of Birth: November 23, 1955  Transition of [facility]) CM/SW Contact  Robynn Eileen Hoose, RN Phone Number: 08/16/2024, 8:59 AM  Clinical Narrative:   Secure message from provider requesting referral to be sent to outside Acute Inpatient Rehab center due to [facility] Inpatient Rehab at full capacity. Spoke with patient, agreeable to referral being sent to other Acute Inpatient Rehab centers. Patient request that we send information to the next closest Acute Inpatient Rehab center.  Referral sent to [facility] Inpatient rehab center via Fax.     Expected Discharge Plan: IP Rehab Facility Barriers to Discharge: Continued Medical Work up               Expected Discharge Plan and Services                                               Social Drivers of Health (SDOH) Interventions SDOH Screenings   Food Insecurity: No Food Insecurity (05/01/2024)  Housing: Unknown (05/01/2024)  Transportation Needs: No Transportation Needs (05/01/2024)  Utilities: Not At Risk (03/25/2024)  Alcohol Screen: Low Risk  (05/01/2024)  Depression (PHQ2-9): Low Risk  (08/05/2024)  Financial Resource Strain: Low Risk  (05/01/2024)  Physical Activity: Sufficiently Active (05/01/2024)  Social Connections: Moderately Isolated (05/01/2024)  Stress: No Stress Concern Present (05/01/2024)  Tobacco Use: Low Risk  (08/14/2024)  Health Literacy: Adequate Health Literacy (03/25/2024)    Readmission Risk Interventions     No data to display

## 2024-08-17 DIAGNOSIS — D497 Neoplasm of unspecified behavior of endocrine glands and other parts of nervous system: Secondary | ICD-10-CM | POA: Diagnosis not present

## 2024-08-17 MED ORDER — ENOXAPARIN SODIUM 40 MG/0.4ML IJ SOSY
40.0000 mg | PREFILLED_SYRINGE | INTRAMUSCULAR | Status: DC
  Administered 2024-08-17: 40 mg via SUBCUTANEOUS
  Filled 2024-08-17: qty 0.4

## 2024-08-17 MED ADMIN — Dexamethasone Tab 4 MG: 4 mg | ORAL | NDC 72578017101

## 2024-08-17 NOTE — Progress Notes (Incomplete)
 Assessment 68 y/o M, hx DVT on Eliquis , cirrhosis who presents with progressive thoracic myelopathy over 1 week and imaging demonstrating cervicothoracic intramedullary edema and T2 intramedullary lesion. Ddx includes metastasis, cavernoma, hemangioblastoma, astrocytoma, ependymoma, lymphoma, etc. Extraspinal workup did not reveal any obvious source. Underwent T1-3 laminectomy resection of intramedullary mass on 10/16  LOS: 9 days    Plan:*** Decadron  wean placed Activity as tolerated Diet as tolerated Ok for DVT ppx now [facility] for Eliquis  on 10/30 Await intra-op path Ok for discharge from nsgy standpoint. Discharge information left in chart. Will plan for 6 wk f/u ***timestamp    Subjective: ***   Objective: Vital signs in last 24 hours: Temp:  [97.5 F (36.4 C)-97.7 F (36.5 C)] 97.7 F (36.5 C) (10/19 1554) Pulse Rate:  [70-89] 83 (10/19 1554) Resp:  [10-16] 10 (10/19 1554) BP: (97-135)/(63-85) 127/71 (10/19 1554) SpO2:  [91 %-99 %] 99 % (10/19 1554)  Intake/Output from previous day: 10/18 0701 - 10/19 0700 In: 10 [I.V.:10] Out: 1000 [Urine:1000] Intake/Output this shift: Total I/O In: 10 [I.V.:10] Out: 550 [Urine:550]  Exam:*** Awake, alert BUE full strength and sensation LLE: subjective numbness from the inguinal ligament distally. 1/5 hip flexion. 2/5 quad, ham. 0/5 DF. 0/5 EHL. 0/5 PF RLE: subjective numbness from the knee distally. 2/5 hip flexion. 4/5 quad. 3/5 ham. 3/5 DF. 3/5 EHL. 3/5 PF Honeycomb dressing covering incision  Lab Results: Recent Labs    08/14/24 1652 08/15/24 0519  WBC  --  10.2  HGB 12.9* 12.7*  HCT 38.0* 37.2*  PLT  --  289   BMET Recent Labs    08/14/24 1652 08/15/24 0519  NA 131* 131*  K 4.1 4.2  CL  --  94*  CO2  --  27  GLUCOSE  --  155*  BUN  --  23  CREATININE  --  0.86  CALCIUM   --  8.7[name] [name] Dustin [name] Dustin [name] 08/17/2024, 4:30 PM

## 2024-08-17 NOTE — TOC Progression Note (Signed)
 Transition of Care [facility]) - Progression Note    Patient Details  Name: Dustin [name] MRN: [id number] Date of Birth: 02-09-1956  [facility] CM/SW Contact  Gwenn Frieze [place], [place] Phone Number: 08/17/2024, 12:30 PM  Clinical Narrative:   Per CIR admissions, pt and pt's sister requesting SNF instead of inpatient rehab. Pt with prior SNF stay at [facility] which he reports was a negative experience. Will begin SNF search and f/u with offers as available.   Frieze Gwenn, MSW, LCSW [phone] (coverage)      Expected Discharge Plan: IP Rehab Facility Barriers to Discharge: Continued Medical Work up               Expected Discharge Plan and Services                                               Social Drivers of Health (SDOH) Interventions SDOH Screenings   Food Insecurity: No Food Insecurity (05/01/2024)  Housing: Unknown (05/01/2024)  Transportation Needs: No Transportation Needs (05/01/2024)  Utilities: Not At Risk (03/25/2024)  Alcohol Screen: Low Risk  (05/01/2024)  Depression (PHQ2-9): Low Risk  (08/05/2024)  Financial Resource Strain: Low Risk  (05/01/2024)  Physical Activity: Sufficiently Active (05/01/2024)  Social Connections: Moderately Isolated (05/01/2024)  Stress: No Stress Concern Present (05/01/2024)  Tobacco Use: Low Risk  (08/14/2024)  Health Literacy: Adequate Health Literacy (03/25/2024)    Readmission Risk Interventions     No data to display

## 2024-08-17 NOTE — Progress Notes (Signed)
   Providing Compassionate, Quality Care - Together   Subjective: Patient reports pain is well-controlled. He feels stronger in his lower extremities than he did prior to surgery. RLE stronger than LLE.  Objective: Vital signs in last 24 hours: Temp:  [97.5 F (36.4 C)-97.7 F (36.5 C)] 97.5 F (36.4 C) (10/19 0740) Pulse Rate:  [69-89] 82 (10/19 0740) Resp:  [10-16] 10 (10/19 0740) BP: (97-154)/(63-85) 97/85 (10/19 0740) SpO2:  [91 %-96 %] 91 % (10/19 0740)  Intake/Output from previous day: 10/18 0701 - 10/19 0700 In: 10 [I.V.:10] Out: 1000 [Urine:1000] Intake/Output this shift: No intake/output data recorded.  Alert and oriented PERRLA Speech clear MAE, S/S BUEs Plantar flexion LLE 2/5 Dorsiflexion LLE 2/5 Knee extension LLE 2/5 Hip flexion LLE 3/5 Plantar flexion RLE 4/5 Dorsiflexion RLE 4/5 Knee extension RLE 3/5 Hip flexion RLE 4-/5 Incision is covered with Honeycomb dressing, Dressing with small amount of dried sanguinous drainage  Lab Results: Recent Labs    08/14/24 1652 08/15/24 0519  WBC  --  10.2  HGB 12.9* 12.7*  HCT 38.0* 37.2*  PLT  --  289   BMET Recent Labs    08/14/24 1652 08/15/24 0519  NA 131* 131*  K 4.1 4.2  CL  --  94*  CO2  --  27  GLUCOSE  --  155*  BUN  --  23  CREATININE  --  0.86  CALCIUM   --  8.7*    Studies/Results: No results found.  Assessment/Plan: Patient underwent T1-T3 laminectomy for resection of intramedullary mass by Dr. Darnella on 08/14/2024.    LOS: 9 days   -Awaiting pathology -Patient appears to be a candidate for acute inpatient rehab. [facility] is at capacity, so TOC looking at external options for patient.  I am in communication with my attending and they agree with the plan for this patient.   Gerard Beck, DNP, AGNP-C Nurse Practitioner  [facility] [address], [address], [place], [place] [address] P: [phone]    F: [phone]  08/17/2024, 8:03  AM

## 2024-08-17 NOTE — Progress Notes (Signed)
 Inpatient Rehab Admissions Coordinator:  Spoke with pt's fiance Jinnie on the telephone. Lydia informed AC that she felt SNF rehab would be best for pt d/t her uncertainty if pt would have 24/7 support after discharge. AC saw pt at bedside. Jinnie was also present. Discussed SNF rehab versus CIR with pt. Pt is in agreement with SNF rehab. TOC made aware. AC will sign off.   Tinnie Yvone Cohens, MS, CCC-SLP Admissions Coordinator [phone]

## 2024-08-17 NOTE — NC FL2 (Signed)
 [place]  MEDICAID FL2 LEVEL OF CARE FORM     IDENTIFICATION  Patient Name: Dustin [name] Birthdate: 04/12/1956 Sex: male Admission Date (Current Location): 08/08/2024  [facility] and [place] Number:  Producer, television/film/video and Address:  [facility], 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   Hyperlipidemia LDL goal <100 03/02/2017   Carcinoid tumor of gastrointestinal tract (HCC) 09/14/2016   Hepatic cirrhosis (HCC) 04/27/2016   Hep C w/o coma, chronic (HCC) 11/22/2015   Pain due to total right knee replacement (HCC) 05/15/2015   History of DVT (deep vein thrombosis) 05/09/2015   Status post total right knee replacement 04/30/2015   Left ventricular hypertrophy by electrocardiogram 04/07/2015   Essential hypertension, benign 04/01/2014   Osteoarthritis 04/01/2014    Orientation RESPIRATION BLADDER Height & Weight     Self, Time, Situation, Place  Normal Continent Weight: 210 lb 1.6 oz (95.3 kg) Height:  5' 11 (180.3 cm)  BEHAVIORAL SYMPTOMS/MOOD NEUROLOGICAL BOWEL NUTRITION STATUS      Incontinent    AMBULATORY STATUS COMMUNICATION OF NEEDS Skin   Extensive Assist Verbally Normal                       Personal Care Assistance Level of Assistance  Bathing, Feeding, Dressing Bathing Assistance: Limited assistance Feeding assistance: Limited assistance Dressing Assistance: Maximum assistance     Functional Limitations Info  Sight, Hearing, Speech Sight Info: Adequate Hearing Info: Adequate Speech Info:  Adequate    SPECIAL CARE FACTORS FREQUENCY  PT (By licensed PT), OT (By licensed OT)                    Contractures Contractures Info: Not present    Additional Factors Info  Code Status Code Status Info: FULL CODE             Current Medications (08/17/2024):  This is the current hospital active medication list Current Facility-Administered Medications  Medication Dose Route Frequency Provider Last Rate Last Admin   acetaminophen  (TYLENOL ) tablet 650 mg  650 mg Oral Q6H PRN Garst, Jonathan R, MD   650 mg at 08/15/24 0630   Or   acetaminophen  (TYLENOL ) suppository 650 mg  650 mg Rectal Q6H PRN Garst, Jonathan R, MD       albuterol  (PROVENTIL ) (2.5 MG/3ML) 0.083% nebulizer solution 2.5 mg  2.5 mg Nebulization Q6H PRN Darnella Dorn JONELLE, MD       amLODipine  (NORVASC ) tablet 10 mg  10 mg Oral Daily Garst, Jonathan R, MD   10 mg at 08/17/24 9061   atorvastatin  (LIPITOR) tablet 20 mg  20 mg Oral Daily Garst, Jonathan R, MD   20 mg at 08/17/24 9061   bisacodyl  (DULCOLAX) suppository 10 mg  10 mg Rectal Daily PRN Chavez, Abigail, NP   10 mg at 08/15/24 0631   carvedilol  (COREG ) tablet 6.25 mg  6.25 mg Oral BID WC Garst, Jonathan R, MD   6.25 mg at 08/17/24 9052   Chlorhexidine  Gluconate Cloth  2 % PADS 6 each  6 each Topical Daily Darnella Dorn SAUNDERS, MD   6 each at 08/17/24 1023   Chlorhexidine  Gluconate Cloth 2 % PADS 6 each  6 each Topical Daily Darnella Dorn SAUNDERS, MD   6 each at 08/17/24 1023   cyclobenzaprine (FLEXERIL) tablet 5 mg  5 mg Oral TID Garst, Jonathan R, MD   5 mg at 08/17/24 9061   dexamethasone  (DECADRON ) tablet 4 mg  4 mg Oral Q12H Garst, Jonathan R, MD   4 mg at 08/17/24 9061   Followed by   NOREEN ON 08/18/2024] dexamethasone  (DECADRON ) tablet 2 mg  2 mg Oral Q12H Darnella Dorn SAUNDERS, MD       Followed by   NOREEN ON 08/21/2024] dexamethasone  (DECADRON ) tablet 2 mg  2 mg Oral Daily Darnella Dorn SAUNDERS, MD       Followed by   NOREEN ON 08/24/2024] dexamethasone   (DECADRON ) tablet 1 mg  1 mg Oral Daily Garst, Jonathan R, MD       hydrochlorothiazide  (HYDRODIURIL ) tablet 25 mg  25 mg Oral Daily Garst, Jonathan R, MD   25 mg at 08/17/24 9060   HYDROmorphone  (DILAUDID ) injection 0.5 mg  0.5 mg Intravenous Q2H PRN Darnella Dorn SAUNDERS, MD   0.5 mg at 08/17/24 9060   methocarbamol  (ROBAXIN ) tablet 750 mg  750 mg Oral Q6H PRN Garst, Jonathan R, MD   750 mg at 08/16/24 1311   mupirocin  ointment (BACTROBAN ) 2 % 1 Application  1 Application Nasal BID Darnella Dorn SAUNDERS, MD   1 Application at 08/17/24 0947   oxyCODONE -acetaminophen  (PERCOCET) 7.5-325 MG per tablet 1 tablet  1 tablet Oral Q4H PRN Darnella Dorn SAUNDERS, MD   1 tablet at 08/17/24 1118   pantoprazole (PROTONIX) EC tablet 40 mg  40 mg Oral Daily Garst, Jonathan R, MD   40 mg at 08/17/24 9061   polyethylene glycol (MIRALAX  / GLYCOLAX ) packet 17 g  17 g Oral Daily PRN Garst, Jonathan R, MD   17 g at 08/15/24 0836   sodium chloride  flush (NS) 0.9 % injection 3 mL  3 mL Intravenous Q12H Darnella Dorn SAUNDERS, MD   3 mL at 08/17/24 0951   vasopressin (PITRESSIN) 20 Units in 100 mL (0.2 unit/mL) infusion-*FOR SHOCK*  0-0.03 Units/min Intravenous Continuous Darnella Dorn SAUNDERS, MD         Discharge Medications: Please see discharge summary for a list of discharge medications.  Relevant Imaging Results:  Relevant Lab Results:   Additional Information SS# [id number]  Nollan Muldrow [place], [place]

## 2024-08-17 NOTE — Progress Notes (Signed)
  Progress Note   Patient: Dustin [name] FMW:[id number] DOB: May 27, 1956 DOA: 08/08/2024     9 DOS: the patient was seen and examined on 08/17/2024 at 9:28AM      Brief hospital course: 68 y.o. M with HTN, compensated hep C cirrhosis, hx VTE on Eliquis , and hx NET in 2017 who presented with paresthesias, found to have thoracic spine lesion.    Radiographic appearance has wide differential.  NSGY consulted and recommended operative resection, planned for 10/16.     Assessment and Plan: T2 intramedullary spinal lesion Status post intramedullary mass resection and laminectomy on 10/16 by Dr. Darnella See prior note from 10/16 -Dexamethasone  taper     Hypertension Blood pressure controlled - Continue home amlodipine , carvedilol , HCTZ   Hyperlipidemia - Continue Lipitor   History of DVT -DVT prophylaxis starting on the 19th - Resume Eliquis  on the 30th   History of hep C cirrhosis Compensated   Benign neuroendocrine tumor of the stomach See prior note   Hyponatremia Mild, asymptomatic            Subjective: No new complaints, no nursing concerns    Physical Exam: BP 127/71 (BP Location: Left Arm)   Pulse 83   Temp 97.7 F (36.5 C) (Oral)   Resp 10   Ht 5' 11 (1.803 m)   Wt 95.3 kg   SpO2 99%   BMI 29.30 kg/m   Adult male, lying in bed, interactive and appropriate RRR, no murmurs, no peripheral edema Respiratory rate normal, lungs clear without rales or wheezes Face metric, speech fluent, oriented x 4.  Upper extremity strength 5/5 and symmetric Lower extremity strength seems reduced, similar to yesterday, seems bilateral.  Data Reviewed: BMP shows mild hyponatremia CBC shows stable anemia   Family Communication:     Disposition: Status is: Inpatient          Author: [name], MD 08/17/2024 5:46 PM  For on call review [email or web address].

## 2024-08-18 ENCOUNTER — Inpatient Hospital Stay

## 2024-08-18 DIAGNOSIS — D497 Neoplasm of unspecified behavior of endocrine glands and other parts of nervous system: Secondary | ICD-10-CM | POA: Diagnosis not present

## 2024-08-18 LAB — SURGICAL PATHOLOGY

## 2024-08-18 NOTE — Progress Notes (Signed)
  Progress Note   Patient: Dustin [name] FMW:[id number] DOB: Aug 12, 1956 DOA: 08/08/2024     10 DOS: the patient was seen and examined on 08/18/2024 at 1120      Brief hospital course: 68 y.o. M with HTN, compensated hep C cirrhosis, hx VTE on Eliquis , and hx NET in 2017 who presented with paresthesias, found to have thoracic spine lesion.    Radiographic appearance has wide differential.  NSGY consulted and recommended operative resection, planned for 10/16.     Assessment and Plan: T2 intramedullary spinal lesion Status post intramedullary mass resection and laminectomy on 10/16 by Dr. Darnella Neurosurgery tapering dexamethasone  - Plan for SNF    Persistent atrial fibrillation Patient was evaluated in the ER for vomiting in July 2025, found to have new onset atrial flutter, started on Eliquis .  Seen in the atrial fibrillation clinic, and was in persistent A-fib.  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  They recommend holding anticoagulation at least 1 month from surgery on 10/16.  Given his CHA2DS2-VASc score of only 2, I recommend strong consideration of NOT resuming anticoagulation at that time, or consideration of a watchman. - Follow up with Cardiology within 1 month   Hypertension Blood pressure normal - Continue home amlodipine , carvedilol , HCTZ   Hyperlipidemia - Continue Lipitor    History of hep C cirrhosis Compensated   Benign neuroendocrine tumor of the stomach See prior note   Hyponatremia Mild, asymptomatic    History of DVT Patient had a nonocclusive brachial vein DVT after hand surgery in 2012.  No clots since then.  Treated with anticoagulation at that time, then stopped.  Recently anticoagulation resumed in setting of A-fib, see #2 above.             Subjective: Patient is doing okay, he is gradually getting  stronger in his legs.  No fever, confusion, respiratory symptoms.     Physical Exam: BP 130/75 (BP Location: Left Arm)   Pulse 93   Temp 97.7 F (36.5 C) (Oral)   Resp 16   Ht 5' 11 (1.803 m)   Wt 95.3 kg   SpO2 96%   BMI 29.30 kg/m   Adult male, lying bed, interactive and appropriate RRR, no murmurs, no peripheral edema Respiratory rate normal, lungs clear without rales or wheezes Attention normal, affect normal, judgment and insight appear normal, face symmetric, speech fluent His left arm is slightly weak relative to the right, and bilateral legs are weak, left more than right, bilaterally 4/5.    Data Reviewed: Discussed with neurosurgery Anticoagulation management     Family Communication:     Disposition: Status is: Inpatient         Author: [name], MD 08/18/2024 3:27 PM  For on call review [email or web address].

## 2024-08-18 NOTE — Discharge Instructions (Signed)
 Wound Care No dressing is required over your incision. Keep clean and dry. You can shower 24 hours after surgery. Let water run over incision. Do not scrub. Pat dry Do not put any creams, lotions, or ointments on incision. Do not submerge your incision for the first 6 weeks (pool, ocean, etc)  Activity Walk each and every day, increasing distance each day. No lifting greater than 10 lbs.  Avoid excessive back/neck motion.  Diet Resume your normal diet.  Call Your Doctor If Any of These Occur Redness, drainage, or swelling at the wound.  Temperature greater than 101 degrees. Severe pain not relieved by pain medication. Incision starts to come apart.  Follow Up Appt Call [phone] today for appointment in 6 weeks if you don't already have one or for any problems.  If you have any hardware placed in your spine, you will need an x-ray before your appointment.

## 2024-08-18 NOTE — TOC Progression Note (Signed)
 Transition of Care [facility]) - Progression Note    Patient Details  Name: Dustin [name] MRN: [id number] Date of Birth: 1956-06-18  [facility]) CM/SW Contact  Montie LOISE Louder, [place] Phone Number: 08/18/2024, 5:03 PM  Clinical Narrative:     CSW met with patient at bedside. CSW provided bed offers. Patient states he will review and inform CSW of his choice.   TOC will continue to follow and assist with discharge planning.  Montie Louder, MSW, LCSW Clinical Social Worker     Expected Discharge Plan: IP Rehab Facility Barriers to Discharge: Continued Medical Work up               Expected Discharge Plan and Services                                               Social Drivers of Health (SDOH) Interventions SDOH Screenings   Food Insecurity: No Food Insecurity (05/01/2024)  Housing: Unknown (05/01/2024)  Transportation Needs: No Transportation Needs (05/01/2024)  Utilities: Not At Risk (03/25/2024)  Alcohol Screen: Low Risk  (05/01/2024)  Depression (PHQ2-9): Low Risk  (08/05/2024)  Financial Resource Strain: Low Risk  (05/01/2024)  Physical Activity: Sufficiently Active (05/01/2024)  Social Connections: Moderately Isolated (05/01/2024)  Stress: No Stress Concern Present (05/01/2024)  Tobacco Use: Low Risk  (08/14/2024)  Health Literacy: Adequate Health Literacy (03/25/2024)    Readmission Risk Interventions     No data to display

## 2024-08-18 NOTE — Progress Notes (Signed)
 Physical Therapy Treatment Patient Details Name: Dustin [name] MRN: [id number] DOB: 03-23-1956 Today's Date: 08/18/2024   History of Present Illness 68 yo male presents to [facility] on 10/10 with progressive LE weakness, inability to ambulate, and bowel/bladder changes. 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 CT abd/pelvis shows splenic lesion. fall 10/11, assisted to floor by staff and no injuries sustained. 10/16 S/P resection of intramedullary mass, T1-T3 laminectomy 10/16.  PMH includes HTN, HLD, hep C, liver cirrhosis, prior DVT, GI carcinoid tumor, remote history of ETOH use.    PT Comments  Pt reporting moderate back pain but reports number as 8/10. Pt endorsing improving LE strength, but slowly. Pt tolerating repeated transfers into standing this date, PT focusing on pt hip extension to upright and sequencing task. Pt benefits from practicing sit<>stands from elevated seat of stedy. Pt progressing slowly, current plan is now short-term rehab post-acutely.       If plan is discharge home, recommend the following: Help with stairs or ramp for entrance;Two people to help with bathing/dressing/bathroom;Assistance with cooking/housework;Assist for transportation;Two people to help with walking and/or transfers   Can travel by private vehicle        Equipment Recommendations  Other (comment) (defer)    Recommendations for Other Services       Precautions / Restrictions Precautions Precautions: Fall;Back Precaution Booklet Issued: Yes (comment) Recall of Precautions/Restrictions: Impaired Restrictions Weight Bearing Restrictions Per Provider Order: No     Mobility  Bed Mobility Overal bed mobility: Needs Assistance Bed Mobility: Supine to Sit     Supine to sit: Mod assist     General bed mobility comments: HOB significantly elevated, assist for  completion of LE translation to EOB, completion of trunk rise. Pt able to scoot self forward towards EOB with increased time.    Transfers Overall transfer level: Needs assistance Equipment used: Ambulation equipment used Transfers: Sit to/from Stand Sit to Stand: Min assist, From elevated surface           General transfer comment: min assist for power up, rise, steady from elevated bed height/stedy seat. Repeated sit<>stands as LE strengthening intervention in stedy, x5. Transfer via Lift Equipment: Stedy  Ambulation/Gait                   Stairs             Wheelchair Mobility     Tilt Bed    Modified Rankin (Stroke Patients Only)       Balance Overall balance assessment: Needs assistance Sitting-balance support: No upper extremity supported, Feet supported, Bilateral upper extremity supported Sitting balance-Leahy Scale: Fair Sitting balance - Comments: able to sit EOB without PT support once balanced   Standing balance support: Bilateral upper extremity supported, During functional activity, Reliant on assistive device for balance Standing balance-Leahy Scale: Poor Standing balance comment: static standing                            Communication Communication Communication: No apparent difficulties  Cognition Arousal: Alert Behavior During Therapy: WFL for tasks assessed/performed   PT - Cognitive impairments: Safety/Judgement, Problem solving, Memory, Sequencing                       PT - Cognition Comments: at times requires repeated cues and safety cues Following commands:  Intact      Cueing Cueing Techniques: Gestural cues, Verbal cues, Tactile cues  Exercises General Exercises - Lower Extremity Long Arc Quad: AAROM, Strengthening, Both, 5 reps, Seated (AAROM to lift and unweight L foot from ground, AROM on R) Hip Flexion/Marching: AAROM, Strengthening, Both, Seated, 10 reps (more assistance needed on L than R)     General Comments        Pertinent Vitals/Pain Pain Assessment Pain Assessment: 0-10 Pain Score: 8  Pain Location: back Pain Descriptors / Indicators: Sore, Discomfort, Grimacing Pain Intervention(s): Limited activity within patient's tolerance, Monitored during session, Premedicated before session, Repositioned    Home Living                          Prior Function            PT Goals (current goals can now be found in the care plan section) Acute Rehab PT Goals Patient Stated Goal: to improve PT Goal Formulation: With patient Time For Goal Achievement: 08/26/24 Potential to Achieve Goals: Good Progress towards PT goals: Progressing toward goals    Frequency    Min 3X/week      PT Plan      Co-evaluation              AM-PAC PT 6 Clicks Mobility   Outcome Measure  Help needed turning from your back to your side while in a flat bed without using bedrails?: A Little Help needed moving from lying on your back to sitting on the side of a flat bed without using bedrails?: A Lot Help needed moving to and from a bed to a chair (including a wheelchair)?: A Lot Help needed standing up from a chair using your arms (e.g., wheelchair or bedside chair)?: A Lot Help needed to walk in hospital room?: Total Help needed climbing 3-5 steps with a railing? : Total 6 Click Score: 11    End of Session   Activity Tolerance: Patient tolerated treatment well Patient left: in chair;with call bell/phone within reach;with chair alarm set Nurse Communication: Mobility status;Need for lift equipment (stedy) PT Visit Diagnosis: Other abnormalities of gait and mobility (R26.89);Muscle weakness (generalized) (M62.81);Unsteadiness on feet (R26.81);Difficulty in walking, not elsewhere classified (R26.2);Other symptoms and signs involving the nervous system (R29.898)     Time: 9079-9059 PT Time Calculation (min) (ACUTE ONLY): 20 min  Charges:    $Therapeutic Activity:  8-22 mins PT General Charges $$ ACUTE PT VISIT: 1 Visit                     Dustin [name], PT DPT Acute Rehabilitation Services Secure Chat Preferred  Office [phone]    Dustin [name] 08/18/2024, 11:10 AM

## 2024-08-19 ENCOUNTER — Other Ambulatory Visit: Payer: Self-pay | Admitting: Radiation Therapy

## 2024-08-19 DIAGNOSIS — D497 Neoplasm of unspecified behavior of endocrine glands and other parts of nervous system: Secondary | ICD-10-CM | POA: Diagnosis not present

## 2024-08-19 LAB — BASIC METABOLIC PANEL WITH GFR
Anion gap: 8 (ref 5–15)
BUN: 22 mg/dL (ref 8–23)
CO2: 29 mmol/L (ref 22–32)
Calcium: 8.6 mg/dL — ABNORMAL LOW (ref 8.9–10.3)
Chloride: 93 mmol/L — ABNORMAL LOW (ref 98–111)
Creatinine, Ser: 0.87 mg/dL (ref 0.61–1.24)
GFR, Estimated: >60 mL/min (ref >60)
Glucose, Bld: 170 mg/dL — ABNORMAL HIGH (ref 70–99)
Potassium: 3.9 mmol/L (ref 3.5–5.1)
Sodium: 130 mmol/L — ABNORMAL LOW (ref 135–145)

## 2024-08-19 NOTE — Progress Notes (Signed)
 Urinary intervention- Bladder scan Bladder scan volume- 474 mL  Patient complaining of pain in lower abdomen.   Dr. Shona and Dr. Jonel notified.   Ordered to bladder scan again at 2206- 579 mL  Order from Dr. Shona to place foley.

## 2024-08-19 NOTE — Progress Notes (Signed)
   08/19/24 1809  Urine Measurement/Characteristics  Urinary Interventions Bladder scan  Bladder Scan Volume (mL) 318 mL   Pt with no spontaneous void since foley catheter removal. Pt states I haven't had the sensation to pee yet. Dr. Jonel notified, and verbal order received to re-check bladder scan and re-assess urinary sensation to void in a few hours, if no spontaneous void then replace foley. Report given to oncoming shift.

## 2024-08-19 NOTE — TOC Progression Note (Signed)
 Transition of Care [facility]) - Progression Note    Patient Details  Name: BRALEN WILTGEN MRN: [id number] Date of Birth: [birth year]  [facility] CM/SW Contact  Montie LOISE Louder, [place] Phone Number: 08/19/2024, 1:41 PM  Clinical Narrative:     CSW met with patient at bedside for SNF choice. He requested to give his fiance, Jinnie a call.   CSW received call from patient's fiance, SNF choice is [facility].  [facility] confirmed bed availability. Requested CMA to start auth for SNF.   TOC will continue to follow and assist with discharge planning.   Montie Louder, MSW, LCSW Clinical Social Worker     Expected Discharge Plan: IP Rehab Facility Barriers to Discharge: Continued Medical Work up               Expected Discharge Plan and Services                                               Social Drivers of Health (SDOH) Interventions SDOH Screenings   Food Insecurity: No Food Insecurity (05/01/2024)  Housing: Unknown (05/01/2024)  Transportation Needs: No Transportation Needs (05/01/2024)  Utilities: Not At Risk (03/25/2024)  Alcohol Screen: Low Risk  (05/01/2024)  Depression (PHQ2-9): Low Risk  (08/05/2024)  Financial Resource Strain: Low Risk  (05/01/2024)  Physical Activity: Sufficiently Active (05/01/2024)  Social Connections: Moderately Isolated (05/01/2024)  Stress: No Stress Concern Present (05/01/2024)  Tobacco Use: Low Risk  (08/14/2024)  Health Literacy: Adequate Health Literacy (03/25/2024)    Readmission Risk Interventions     No data to display

## 2024-08-19 NOTE — Progress Notes (Signed)
 Physical Therapy Treatment Patient Details Name: Dustin [name] MRN: [id number] DOB: 1956/04/29 Today's Date: 08/19/2024   History of Present Illness 68 yo male presents to [facility] on 10/10 with progressive LE weakness, inability to ambulate, and bowel/bladder changes. 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 CT abd/pelvis shows splenic lesion. fall 10/11, assisted to floor by staff and no injuries sustained. 10/16 S/P resection of intramedullary mass, T1-T3 laminectomy 10/16.  PMH includes HTN, HLD, hep C, liver cirrhosis, prior DVT, GI carcinoid tumor, remote history of ETOH use.    PT Comments  Pt at EOB with OT upon PT arrival to room. Pt endorsing fatigue but motivated to practice transfers. Pt tolerating repeated transfers into standing, pt with improving assist with repeated reps. Pt struggles with dynamic standing, like weight shifting and stepping, especially with LLE and requires max LE blocking to prevent knee buckling. PT anticipates this is an issue related to both sensory and motor deficits LLE>RLE. Pt progressing slowly.     If plan is discharge home, recommend the following: Help with stairs or ramp for entrance;Two people to help with bathing/dressing/bathroom;Assistance with cooking/housework;Assist for transportation;Two people to help with walking and/or transfers   Can travel by private vehicle        Equipment Recommendations  Other (comment) (defer)    Recommendations for Other Services       Precautions / Restrictions Precautions Precautions: Fall;Back Precaution Booklet Issued: Yes (comment) Recall of Precautions/Restrictions: Impaired Precaution/Restrictions Comments: reviewed with pt Required Braces or Orthoses:  (no brace needed per Dr. Darnella 10/17) Restrictions Weight Bearing Restrictions Per Provider Order: No     Mobility  Bed  Mobility Overal bed mobility: Needs Assistance             General bed mobility comments: EOB with OT    Transfers Overall transfer level: Needs assistance Equipment used: Ambulation equipment used, Rolling walker (2 wheels), 2 person hand held assist Transfers: Sit to/from Stand, Bed to chair/wheelchair/BSC Sit to Stand: Mod assist, From elevated surface, Via lift equipment, Max assist, +2 physical assistance Stand pivot transfers: Max assist, +2 physical assistance         General transfer comment: +1 mod assist to power up and steady from EOB in stedy, pt pulling on stedy and demonstrating incresaed foward bias.  Used RW with +2 max assist to power up from EOB, cueing for sequencing and blocking knees with max assist +2.  Pivot to recliner with max assist +2. repeated stands x 2 in recliner with progressing to mod assist +2 and improved recall of hand placement. Transfer via Lift Equipment: Stedy  Ambulation/Gait               General Gait Details: nt   Building services engineer Rankin (Stroke Patients Only)       Balance Overall balance assessment: Needs assistance Sitting-balance support: No upper extremity supported, Feet supported, Bilateral upper extremity supported Sitting balance-Leahy Scale: Fair Sitting balance - Comments: able to sit EOB without support once balanced, contact guard for safety   Standing balance support: Bilateral upper extremity supported, During functional activity, Reliant on assistive device for balance Standing balance-Leahy Scale: Poor Standing balance comment: relies on BUE and external support  Communication Communication Communication: No apparent difficulties  Cognition Arousal: Alert Behavior During Therapy: WFL for tasks assessed/performed   PT - Cognitive impairments: Safety/Judgement, Problem solving, Memory, Sequencing                        PT - Cognition Comments: at times requires repeated cues and safety cues, also benefits from step-by-step instructions Following commands: Intact      Cueing Cueing Techniques: Gestural cues, Verbal cues, Tactile cues  Exercises General Exercises - Lower Extremity Long Arc Quad: AAROM, Both, 10 reps, Seated Other Exercises Other Exercises: D2 LE extension pattern x10 bilat against mod resistance    General Comments        Pertinent Vitals/Pain Pain Assessment Pain Assessment: Faces Faces Pain Scale: Hurts little more Pain Location: back Pain Descriptors / Indicators: Sore, Discomfort, Grimacing Pain Intervention(s): Limited activity within patient's tolerance, Monitored during session, Repositioned    Home Living                          Prior Function            PT Goals (current goals can now be found in the care plan section) Acute Rehab PT Goals Patient Stated Goal: to improve PT Goal Formulation: With patient Time For Goal Achievement: 08/26/24 Potential to Achieve Goals: Good Progress towards PT goals: Progressing toward goals    Frequency    Min 3X/week      PT Plan      Co-evaluation   Reason for Co-Treatment:  (dovetail with OT) PT goals addressed during session: Mobility/safety with mobility;Strengthening/ROM;Balance        AM-PAC PT 6 Clicks Mobility   Outcome Measure  Help needed turning from your back to your side while in a flat bed without using bedrails?: A Little Help needed moving from lying on your back to sitting on the side of a flat bed without using bedrails?: A Lot Help needed moving to and from a bed to a chair (including a wheelchair)?: A Lot Help needed standing up from a chair using your arms (e.g., wheelchair or bedside chair)?: A Lot Help needed to walk in hospital room?: Total Help needed climbing 3-5 steps with a railing? : Total 6 Click Score: 11    End of Session Equipment Utilized  During Treatment: Gait belt Activity Tolerance: Patient tolerated treatment well Patient left: in chair;with call bell/phone within reach;with chair alarm set Nurse Communication: Mobility status;Need for lift equipment (stedy) PT Visit Diagnosis: Other abnormalities of gait and mobility (R26.89);Unsteadiness on feet (R26.81);Other symptoms and signs involving the nervous system (R29.898)     Time: 1036-1100 PT Time Calculation (min) (ACUTE ONLY): 24 min  Charges:    $Therapeutic Activity: 8-22 mins PT General Charges $$ ACUTE PT VISIT: 1 Visit                     [name], PT DPT Acute Rehabilitation Services Secure Chat Preferred  Office [phone]    Chardai Gangemi E Stroup 08/19/2024, 4:00 PM

## 2024-08-19 NOTE — Progress Notes (Signed)
 Progress Note   Patient: Dustin [name] FMW:[id number] DOB: 10-Jan-1956 DOA: 08/08/2024     11 DOS: the patient was seen and examined on 08/19/2024 at 8:10AM      Brief hospital course: 68 y.o. M with HTN, compensated hep C cirrhosis, hx VTE on Eliquis , and hx NET in 2017 who presented with paresthesias, found to have thoracic spine lesion.    Radiographic appearance has wide differential.  NSGY consulted and recommended operative resection, planned for 10/16.     Assessment and Plan: T2 intramedullary spinal lesion Status post intramedullary mass resection and laminectomy on 10/16 by Dr. Darnella -Continue dexamethasone  taper per neurosurgery - Plan for SNF     Persistent atrial fibrillation Patient was evaluated in the ER for vomiting in July 2025, found to have new onset atrial flutter, started on Eliquis .  Seen in the atrial fibrillation clinic, and was in persistent A-fib.   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   They recommend holding anticoagulation at least 1 month from surgery on 10/16.   Given his CHA2DS2-VASc score of only 2, I recommend strong consideration of NOT resuming anticoagulation at that time, or consideration of a watchman. - Will need cardiology follow-up arranged at the time of discharge - Hold Eliquis    Urinary retention Due to spinal lesion.  Discussed with neurosurgery.  Reasonable to attempt voiding trial today, may need Foley at discharge to SNF with plans for outpatient follow-up. 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    Hyponatremia Mild, asymptomatic     History of DVT Patient had a nonocclusive brachial vein DVT after hand surgery in 2012.  No clots since then.  Treated with anticoagulation at that time, then stopped.  Recently anticoagulation resumed in setting of A-fib, see #2 above.                 Subjective: Patient is feeling well, leg strength gradually improving.     Physical Exam: BP 125/82 (BP Location: Right Arm)   Pulse 97   Temp 97.8 F (36.6 C) (Axillary)   Resp 16   Ht 5' 11 (1.803 m)   Wt 95.3 kg   SpO2 96%   BMI 29.30 kg/m   Adult male, lying in bed, interactive and appropriate RRR, no murmurs, no peripheral edema Respiratory rate normal, lungs clear without rales or wheezes Upper extremity strength looks quite a bit better, maybe some mild weakness on the left, lower extremity strength seems quite weak, more on the left on the right    Data Reviewed: Discussed with neurosurgery Basic metabolic panel shows mild hyponatremia, normal electrolytes and renal function otherwise    Family Communication: Fiance at the bedside    Disposition: Status is: Inpatient 68 year old M admitted for paresthesias, found to have spinal lesion  Taken to the OR by neurosurgery 10/16 for resection, postoperative course has been uncomplicated.  To the best of my knowledge, all expected diagnostic testing that requires acute inpatient care has been completed.  He is medically ready to transition to an outpatient treatment plan, but because of his diminished functional status from baseline, age, and leg weakness from spinal lesion, it  is my medical opinion that discharge home at this time would pose a high risk unnecessary harm.  Safe disposition is being sought in SNF, and the patient will be ready to transition to that setting as soon as arranged.         Author: [name], MD 08/19/2024 5:57 PM  For on call review [email or web address].

## 2024-08-19 NOTE — Progress Notes (Signed)
 Occupational Therapy Treatment Patient Details Name: Dustin [name] MRN: [id number] DOB: November 10, 1955 Today's Date: 08/19/2024   History of present illness 68 yo male presents to [facility] on 10/10 with progressive LE weakness, inability to ambulate, and bowel/bladder changes. 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 CT abd/pelvis shows splenic lesion. fall 10/11, assisted to floor by staff and no injuries sustained. 10/16 S/P resection of intramedullary mass, T1-T3 laminectomy 10/16.  PMH includes HTN, HLD, hep C, liver cirrhosis, prior DVT, GI carcinoid tumor, remote history of ETOH use.   OT comments  Pt supine in bed and eager to participate in therapy.  Pt completing bed mobility with mod assist, worked on sitting balance without UE support at EOB with up to min assist at times but statically able to sustain balance with contact guard assist; pt preference to UE support.  Stood via stedy, RW +2 and +2 bil hand held support, improved body control and positioning with RW given practice progressing to +2 to stand, but pivoting to recliner needing bil hand held support to pivot. Continues to require setup for grooming in recliner, total assist for LB ADLs.  Will continue to follow acutely.  Updated dc plan to <3hrs/day inpatient setting per pt/family preference.       If plan is discharge home, recommend the following:  Two people to help with walking and/or transfers;Assistance with cooking/housework;Assist for transportation;Help with stairs or ramp for entrance;A lot of help with bathing/dressing/bathroom   Equipment Recommendations  Other (comment) (defer)    Recommendations for Other Services      Precautions / Restrictions Precautions Precautions: Fall;Back Precaution Booklet Issued: Yes (comment) Recall of Precautions/Restrictions: Impaired Precaution/Restrictions  Comments: reviewed with pt Required Braces or Orthoses:  (no brace needed per Dr. Darnella 10/17) Restrictions Weight Bearing Restrictions Per Provider Order: No       Mobility Bed Mobility Overal bed mobility: Needs Assistance Bed Mobility: Supine to Sit     Supine to sit: Used rails, Mod assist     General bed mobility comments: cueing for technique to avoid twisting, needing assist for LEs and scooting hips    Transfers Overall transfer level: Needs assistance Equipment used: Ambulation equipment used, Rolling walker (2 wheels), 2 person hand held assist Transfers: Sit to/from Stand, Bed to chair/wheelchair/BSC Sit to Stand: Mod assist, From elevated surface, Via lift equipment, Max assist, +2 physical assistance Stand pivot transfers: Max assist, +2 physical assistance         General transfer comment: +1 mod assist to power up and steady from EOB in stedy, pt pulling on stedy and demonstrating incresaed foward bias.  Used RW with +2 max assist to power up from EOB, cueing for sequencing and blocking knees with max assist +2.  Pivot to recliner with max assist +2. repeated stands x 2 in recliner with progressing to mod assist +2 and improved recall of hand placement. Transfer via Lift Equipment: Stedy   Balance Overall balance assessment: Needs assistance Sitting-balance support: No upper extremity supported, Feet supported, Bilateral upper extremity supported Sitting balance-Leahy Scale: Fair Sitting balance - Comments: able to sit EOB without support once balanced, contact guard for safety   Standing balance support: Bilateral upper extremity supported, During functional activity, Reliant on assistive device for balance Standing balance-Leahy Scale: Poor Standing balance comment: relies on BUE and external support  ADL either performed or assessed with clinical judgement   ADL Overall ADL's : Needs assistance/impaired     Grooming:  Sitting;Contact guard assist               Lower Body Dressing: Total assistance;+2 for physical assistance;Sit to/from stand   Toilet Transfer: +2 for physical assistance;Maximal assistance Toilet Transfer Details (indicate cue type and reason): bil hand held support to recliner, simulated         Functional mobility during ADLs: Moderate assistance;Maximal assistance;+2 for physical assistance      Extremity/Trunk Assessment              Vision       Perception     Praxis     Communication Communication Communication: No apparent difficulties   Cognition Arousal: Alert Behavior During Therapy: WFL for tasks assessed/performed Cognition: No apparent impairments                               Following commands: Intact        Cueing   Cueing Techniques: Gestural cues, Verbal cues, Tactile cues  Exercises Exercises: Other exercises Other Exercises Other Exercises: sitting EOB, worked on static sitting balance with UE movements from head to hips x 5, 2 sets    Shoulder Instructions       General Comments      Pertinent Vitals/ Pain       Pain Assessment Pain Assessment: 0-10 Faces Pain Scale: Hurts little more Pain Location: back Pain Descriptors / Indicators: Sore, Discomfort, Grimacing Pain Intervention(s): Limited activity within patient's tolerance, Monitored during session, Repositioned, Premedicated before session  Home Living                                          Prior Functioning/Environment              Frequency  Min 2X/week        Progress Toward Goals  OT Goals(current goals can now be found in the care plan section)  Progress towards OT goals: Progressing toward goals  Acute Rehab OT Goals Patient Stated Goal: get better OT Goal Formulation: With patient Time For Goal Achievement: 08/27/24 Potential to Achieve Goals: Good  Plan      Co-evaluation                 AM-PAC  OT 6 Clicks Daily Activity     Outcome Measure   Help from another person eating meals?: None Help from another person taking care of personal grooming?: A Little Help from another person toileting, which includes using toliet, bedpan, or urinal?: Total Help from another person bathing (including washing, rinsing, drying)?: A Lot Help from another person to put on and taking off regular upper body clothing?: A Little Help from another person to put on and taking off regular lower body clothing?: Total 6 Click Score: 14    End of Session Equipment Utilized During Treatment: Gait belt;Rolling walker (2 wheels)  OT Visit Diagnosis: Unsteadiness on feet (R26.81);Muscle weakness (generalized) (M62.81)   Activity Tolerance Patient tolerated treatment well   Patient Left in chair;with call bell/phone within reach;Other (comment) (PT present in room)   Nurse Communication Mobility status;Precautions        Time: 8976-8946 OT Time Calculation (min): 30 min  Charges: OT General Charges $  OT Visit: 1 Visit OT Treatments $Self Care/Home Management : 8-22 mins  Etta NOVAK, OT Acute Rehabilitation Services Office [phone] Secure Chat Preferred    Etta GORMAN Hope 08/19/2024, 11:22 AM

## 2024-08-19 NOTE — Progress Notes (Signed)
 BRIEF ONCOLOGY NOTE:  Dustin [name] 68 y.o. was admitted on 08/08/2024 with complaints of balance issues and lower extremity numbness.   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  Neurosurgery consulted and patient had mass removal and resection 08/14/24.  Path showed no abnormal B or T-cell population. No tumor seen.  As there is no definitive evidence of malignancy, please reconsult oncology if new concerns arise. We are signing off at this time.  Thank you for including us  in the care of Dustin [name].  Olam JINNY Brunner, NP

## 2024-08-20 DIAGNOSIS — Z7901 Long term (current) use of anticoagulants: Secondary | ICD-10-CM

## 2024-08-20 DIAGNOSIS — K921 Melena: Secondary | ICD-10-CM

## 2024-08-20 DIAGNOSIS — D497 Neoplasm of unspecified behavior of endocrine glands and other parts of nervous system: Secondary | ICD-10-CM | POA: Diagnosis not present

## 2024-08-20 LAB — CBC
HCT: 41.5 % (ref 39.0–52.0)
Hemoglobin: 14 g/dL (ref 13.0–17.0)
MCH: 30 pg (ref 26.0–34.0)
MCHC: 33.7 g/dL (ref 30.0–36.0)
MCV: 88.9 fL (ref 80.0–100.0)
Platelets: 324 K/uL (ref 150–400)
RBC: 4.67 MIL/uL (ref 4.22–5.81)
RDW: 13.8 % (ref 11.5–15.5)
WBC: 17.9 K/uL — ABNORMAL HIGH (ref 4.0–10.5)
nRBC: 0 % (ref 0.0–0.2)

## 2024-08-20 LAB — HEMOGLOBIN AND HEMATOCRIT, BLOOD
HCT: 40.2 % (ref 39.0–52.0)
Hemoglobin: 13.7 g/dL (ref 13.0–17.0)

## 2024-08-20 LAB — OCCULT BLOOD X 1 CARD TO LAB, STOOL: Fecal Occult Bld: POSITIVE — AB

## 2024-08-20 LAB — FLOW CYTOMETRY

## 2024-08-20 MED ORDER — PANTOPRAZOLE SODIUM 40 MG IV SOLR
40.0000 mg | Freq: Two times a day (BID) | INTRAVENOUS | Status: DC
  Administered 2024-08-20 – 2024-08-24 (×8): 40 mg via INTRAVENOUS
  Filled 2024-08-20 (×8): qty 10

## 2024-08-20 MED ORDER — DEXAMETHASONE 1 MG PO TABS: ORAL_TABLET | ORAL | Status: AC

## 2024-08-20 MED ORDER — OXYCODONE-ACETAMINOPHEN 7.5-325 MG PO TABS: 1.0000 | ORAL_TABLET | ORAL | 0 refills | Status: AC | PRN

## 2024-08-20 NOTE — Progress Notes (Signed)
   08/20/24 1321  Provider Notification  Provider Name/Title Dr. Trixie  Date Provider Notified 08/20/24  Time Provider Notified 1321  Method of Notification  (secure chat)  Notification Reason Other (Comment) (Pt with black and bright red stool with strong odor.)  Provider response At bedside  Date of Provider Response 08/20/24  Time of Provider Response 1335   Pt with a black and bright red bowel movement with foul odor. Thin fissure noted in area between upper buttcheeks with bright red blood. Pt changed/cleaned and new sacral foam drsg applied. Dr. Trixie notified. Tomeka, CN notified. New orders received.  1335- Dr. Trixie at bedside.  1536- Per Dr. Trixie pt's fiance called and updated.

## 2024-08-20 NOTE — Progress Notes (Signed)
 I was informed that patient has had a large black stool with some fresh bright blood. No BMs in the past 3-4 days, so this appears new. Will cancel the DC, GI consulted. Repeat CBC pending.   Arlita Buffkin M. Trixie, MD, PhD [facility]  Between 7 am - 7 pm you can contact me via Amion (for emergencies) or Securechat (non urgent matters).  I am not available 7 pm - 7 am, please contact night coverage MD/APP via Amion

## 2024-08-20 NOTE — H&P (View-Only) (Signed)
 Consultation  Referring Provider: [facility]/ Gherge Primary Care Physician:  Delbert Clam, MD Primary Gastroenterologist:  unassigned  Reason for Consultation: Melena  HPI: Dustin [name] is a 68 y.o. male with history of atrial fibrillation for which he has been on chronic Eliquis , also with history of hypertension, history of hepatitis C with cirrhosis, compensated and previous DVT.  Patient was admitted 12 days ago after he had presented to the ER with complaints of left leg tingling and balance issues. Workup revealed a thoracic spine lesion, neurosurgery was consulted and operative resection was recommended.  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  Given that this had recurred in the setting of Eliquis  neurosurgery recommended not continuing his therapeutic anticoagulation and if this needed to absolutely be restarted to wait at least 1 month. He was planned to be transferred to rehab today.  Unfortunately earlier today he was reported to have passed a large black melenic appearing stool and we were asked to evaluate. He says that he had not had a bowel movement for few days but has not noticed any melena or hematochezia during his stay and was not having any GI issues at home.  His last hemoglobin had been checked on 1017 and was 12.7/hematocrit 37.2/MCV 87.  Labs have returned this afternoon-WBC 17.9/hemoglobin 14/hematocrit 41.5/MCV 88.9/platelets 324 Stool for Hemoccult is pending Be met yesterday showed a sodium 130/potassium 3.9/BUN 22/creatinine 0.87.  He relates that last night he was very bloated and uncomfortable but was also unable to empty his bladder.  He had Foley catheter placed which resolved his symptoms.  Other than that he is not having any abdominal pain denies any problems  with nausea or vomiting no heartburn or indigestion, no dysphagia.  He had been on high-dose steroids during this admission.  He had undergone previous EGD and colonoscopy in 2017 per Dr. Aneita.  At EGD noted to have a normal esophagus, and mild gastritis Path was pertinent for H. pylori.  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 At colonoscopy there was a 6 mm tubular adenoma removed and he was noted to have a few diverticuli.  CT of the chest abdomen and pelvis as part of his workup during this admission shows a normal-appearing esophagus, no lymphadenopathy, mild diffuse fatty infiltration of the liver, gallbladder and bile ducts appear normal, large mostly homogeneous appearing hyperenhancing lesion in the spleen measuring 6.5 x 6.5 cm, no bowel wall thickening or inflammatory changes noted, diverticulosis without diverticulitis no free fluid-I recommended for further evaluation of the splenic lesion.   Past Medical History:  Diagnosis Date   Arthritis    bil knees, left hand   Atrial fibrillation (HCC)    Carcinoid tumor (HCC)    DVT of upper extremity (deep vein thrombosis) (HCC)    left brachial vein, 12/2010   Helicobacter pylori gastritis    History of blood clots    to left arm   Hypertension    Shortness of breath dyspnea    with exertion   Tubular adenoma of colon 2017    Past Surgical History:  Procedure Laterality Date   colonscopy      1 year ago    KNEE ARTHROPLASTY Right 04/30/2015   Procedure: COMPUTER ASSISTED TOTAL KNEE ARTHROPLASTY;  Surgeon: Oneil JAYSON Herald, MD;  Location: [facility] OR;  Service: Orthopedics;  Laterality: Right;   LAMINECTOMY N/A 08/14/2024   Procedure: THORACIC ONE-THREE LAMINECTOMY FOR TUMOR;  Surgeon: Darnella Dorn SAUNDERS, MD;  Location: [facility] OR;  Service: Neurosurgery;  Laterality: N/A;  T1-T3 LAM FOR INTRADURAL MASS   Left  Hand   2011   cyst removed.   OPERATIVE ULTRASOUND  08/14/2024   Procedure: US  INTRAOPERATIVE;  Surgeon:  Darnella Dorn SAUNDERS, MD;  Location: [facility] OR;  Service: Neurosurgery;;   TOTAL KNEE ARTHROPLASTY  10/19/2011   Procedure: TOTAL KNEE ARTHROPLASTY;  Surgeon: Rome JULIANNA Pepper;  Location: [facility] OR;  Service: Orthopedics;  Laterality: Left;    Prior to Admission medications   Medication Sig Start Date End Date Taking? Authorizing Provider  acetaminophen  (TYLENOL ) 500 MG tablet Take 1 tablet (500 mg total) by mouth every 6 (six) hours as needed. Patient taking differently: Take 500 mg by mouth every 6 (six) hours as needed for mild pain (pain score 1-3) or moderate pain (pain score 4-6) (tooth pains). 06/08/24  Yes Nivia Colon, PA-C  acetaminophen -codeine  (TYLENOL  #3) 300-30 MG tablet Take 1 tablet by mouth every 6 (six) hours as needed for moderate pain (pain score 4-6) or severe pain (pain score 7-10) (tooth pains).   Yes [provider]  albuterol  (VENTOLIN  HFA) 108 (90 Base) MCG/ACT inhaler INHALE 2 PUFFS INTO THE LUNGS EVERY 6 HOURS AS NEEDED FOR WHEEZING OR SHORTNESS OF BREATH 07/06/24  Yes Newlin, Enobong, MD  amLODipine  (NORVASC ) 10 MG tablet Take 1 tablet (10 mg total) by mouth daily. 01/02/24  Yes Danton Jon HERO, PA-C  apixaban  (ELIQUIS ) 5 MG TABS tablet Take 1 tablet (5 mg total) by mouth 2 (two) times daily. 04/17/24  Yes Terra Fairy PARAS, PA-C  atorvastatin  (LIPITOR) 20 MG tablet TAKE 1 TABLET(20 MG) BY MOUTH DAILY 03/26/24  Yes Newlin, Enobong, MD  carvedilol  (COREG ) 6.25 MG tablet TAKE 1 TABLET(6.25 MG) BY MOUTH TWICE DAILY WITH A MEAL 07/09/24  Yes Newlin, Enobong, MD  cetirizine  (ZYRTEC ) 10 MG tablet Take 1 tablet (10 mg total) by mouth daily. Patient taking differently: Take 10 mg by mouth as needed for allergies. 01/02/24  Yes McClung, Jon HERO, PA-C  diclofenac  Sodium (VOLTAREN ) 1 % GEL APPLY 4 GRAMS EXTERNALLY TO THE AFFECTED AREA FOUR TIMES DAILY Patient taking differently: Apply 4 g topically as needed. APPLY 4 GRAMS EXTERNALLY TO THE AFFECTED AREA FOUR TIMES DAILY 12/13/22  Yes Danton Jon M, PA-C  fluticasone  (FLONASE ) 50 MCG/ACT nasal spray PLACE 1 SPRAY IN BOTH NOSTRILS DAILY Patient taking differently: Place 1 spray into both nostrils as needed for allergies. 06/16/24  Yes Newlin, Enobong, MD  hydrochlorothiazide  (HYDRODIURIL ) 25 MG tablet Take 1 tablet (25 mg total) by mouth daily. 08/01/24  Yes Newlin, Corrina, MD  omeprazole  (PRILOSEC) 20 MG capsule TAKE 1 CAPSULE(20 MG) BY MOUTH DAILY 06/16/24  Yes Newlin, Enobong, MD  traMADol  (ULTRAM ) 50 MG tablet Take 50 mg by mouth every 6 (six) hours as needed for severe pain (pain score 7-10) or moderate pain (pain score 4-6) (tooth pains).   Yes [provider]  dexamethasone  (DECADRON ) 1 MG tablet Take 2 tablets (2 mg total) by mouth every 12 (twelve) hours for 1 day, THEN 2 tablets (2 mg total) daily for 3 days, THEN 1 tablet (1 mg total) daily for 3 days. 08/20/24 08/27/24  Gherghe, Costin M, MD  oxyCODONE -acetaminophen  (PERCOCET) 7.5-325 MG tablet Take 1 tablet by mouth every 4 (four) hours as needed for moderate pain (pain  score 4-6) or severe pain (pain score 7-10). 08/20/24   Trixie Nilda HERO, MD    Current Facility-Administered Medications  Medication Dose Route Frequency Provider Last Rate Last Admin   acetaminophen  (TYLENOL ) tablet 650 mg  650 mg Oral Q6H PRN Garst, Jonathan R, MD   650 mg at 08/15/24 0630   Or   acetaminophen  (TYLENOL ) suppository 650 mg  650 mg Rectal Q6H PRN Darnella Dorn SAUNDERS, MD       albuterol  (PROVENTIL ) (2.5 MG/3ML) 0.083% nebulizer solution 2.5 mg  2.5 mg Nebulization Q6H PRN Darnella Dorn SAUNDERS, MD       amLODipine  (NORVASC ) tablet 10 mg  10 mg Oral Daily Garst, Jonathan R, MD   10 mg at 08/20/24 9096   atorvastatin  (LIPITOR) tablet 20 mg  20 mg Oral Daily Garst, Jonathan R, MD   20 mg at 08/20/24 9096   carvedilol  (COREG ) tablet 6.25 mg  6.25 mg Oral BID WC Garst, Jonathan R, MD   6.25 mg at 08/20/24 9096   Chlorhexidine  Gluconate Cloth 2 % PADS 6 each  6 each Topical Daily Darnella Dorn SAUNDERS, MD   6 each at 08/20/24 1154   cyclobenzaprine (FLEXERIL) tablet 5 mg  5 mg Oral TID Garst, Jonathan R, MD   5 mg at 08/20/24 9096   dexamethasone  (DECADRON ) tablet 2 mg  2 mg Oral Q12H Garst, Jonathan R, MD   2 mg at 08/20/24 9096   Followed by   NOREEN ON 08/21/2024] dexamethasone  (DECADRON ) tablet 2 mg  2 mg Oral Daily Darnella Dorn SAUNDERS, MD       Followed by   NOREEN ON 08/24/2024] dexamethasone  (DECADRON ) tablet 1 mg  1 mg Oral Daily Darnella Dorn SAUNDERS, MD       hydrochlorothiazide  (HYDRODIURIL ) tablet 25 mg  25 mg Oral Daily Garst, Jonathan R, MD   25 mg at 08/20/24 9096   methocarbamol  (ROBAXIN ) tablet 750 mg  750 mg Oral Q6H PRN Garst, Jonathan R, MD   750 mg at 08/20/24 1419   oxyCODONE -acetaminophen  (PERCOCET) 7.5-325 MG per tablet 1 tablet  1 tablet Oral Q4H PRN Darnella Dorn SAUNDERS, MD   1 tablet at 08/20/24 1421   pantoprazole (PROTONIX) injection 40 mg  40 mg Intravenous Q12H Esterwood, Amy S, PA-C       polyethylene glycol (MIRALAX  / GLYCOLAX ) packet 17 g  17 g Oral Daily PRN Garst, Jonathan R, MD   17 g at 08/20/24 9096   sodium chloride  flush (NS) 0.9 % injection 3 mL  3 mL Intravenous Q12H Darnella Dorn SAUNDERS, MD   3 mL at 08/20/24 9095    Allergies as of 08/08/2024   (No Known Allergies)    Family History  Problem Relation Age of Onset   Cancer Mother    Cancer Father        prostate cancer   Diabetes Other    Hypertension Other    Diabetes Maternal Uncle    Anesthesia problems Neg Hx    Hypotension Neg Hx    Malignant hyperthermia Neg Hx    Pseudochol deficiency Neg Hx     Social History   Socioeconomic History   Marital status: Single    Spouse name: Not on file   Number of children: Not on file   Years of education: Not on file   Highest education level: Not on file  Occupational History   Not on file  Tobacco Use   Smoking status: Never   Smokeless tobacco: Never  Substance and Sexual Activity   Alcohol use: No    Alcohol/week: 0.0 standard  drinks of alcohol   Drug use: No   Sexual activity: Yes  Other Topics Concern   Not on file  Social History Narrative   Not on file   Social Drivers of Health   Financial Resource Strain: Low Risk  (05/01/2024)   Overall Financial Resource Strain (CARDIA)    Difficulty of Paying Living Expenses: Not hard at all  Food Insecurity: No Food Insecurity (05/01/2024)   Hunger Vital Sign    Worried About Running Out of Food in the Last Year: Never true    Ran Out of Food in the Last Year: Never true  Transportation Needs: No Transportation Needs (05/01/2024)   PRAPARE - Administrator, Civil Service (Medical): No    Lack of Transportation (Non-Medical): No  Physical Activity: Sufficiently Active (05/01/2024)   Exercise Vital Sign    Days of Exercise per Week: 3 days    Minutes of Exercise per Session: 60 min  Stress: No Stress Concern Present (05/01/2024)   [employer] of Occupational Health - Occupational Stress Questionnaire    Feeling of Stress: Not at all  Social Connections: Moderately Isolated (05/01/2024)   Social Connection and Isolation Panel    Frequency of Communication with Friends and Family: Once a week    Frequency of Social Gatherings with Friends and Family: Twice a week    Attends Religious Services: More than 4 times per year    Active Member of [employer] or Organizations: No    Attends Engineer, structural: Not on file    Marital Status: Never married  Intimate Partner Violence: Not At Risk (03/25/2024)   Humiliation, Afraid, Rape, and Kick questionnaire    Fear of Current or Ex-Partner: No    Emotionally Abused: No    Physically Abused: No    Sexually Abused: No    Review of Systems: Pertinent positive and negative review of systems were noted in the above HPI section.  All other review of systems was otherwise negative.   Physical Exam: Vital signs in last 24 hours: Temp:  [97.6 F (36.4 C)-97.9 F (36.6 C)] 97.9 F (36.6 C) (10/22  1344) Pulse Rate:  [79-97] 83 (10/22 1344) Resp:  [14-16] 16 (10/22 1344) BP: (102-132)/(65-90) 132/73 (10/22 1344) SpO2:  [94 %-98 %] 95 % (10/22 1344) Last BM Date : 08/19/24 General:   Alert,  Well-developed, chronically ill-appearing older African-American male pleasant and cooperative in NAD, sitting up in the chair Head:  Normocephalic and atraumatic. Eyes:  Sclera clear, no icterus.   Conjunctiva pink. Ears:  Normal auditory acuity. Nose:  No deformity, discharge,  or lesions. Mouth:  No deformity or lesions.   Neck:  Supple; no masses or thyromegaly. Lungs:  Clear throughout to auscultation.  Scattered rhonchi.  Heart:  Regular rate and rhythm; stock murmur present Abdomen:  Soft,nontender, BS active,nonpalp mass or hsm.  Foley catheter in place Rectal; not done, Hemoccult pending Msk:  Symmetrical without gross deformities. . Pulses:  Normal pulses noted. Extremities:  Without clubbing or edema. Neurologic:  Alert and  oriented x4;  grossly normal neurologically. Skin:  Intact without significant lesions or rashes.. Psych:  Alert and cooperative. Normal mood and affect.  Intake/Output from previous day: 10/21 0701 - 10/22 0700 In: 420 [P.O.:420] Out: 1200 [Urine:1200] Intake/Output this shift: Total I/O In: -  Out: 850 [Urine:850]  Lab Results: Recent Labs    08/20/24  1420  WBC 17.9*  HGB 14.0  HCT 41.5  PLT 324   BMET Recent Labs    08/19/24 1008  NA 130*  K 3.9  CL 93*  CO2 29  GLUCOSE 170*  BUN 22  CREATININE 0.87  CALCIUM  8.6*   LFT No results for input(s): PROT, ALBUMIN, AST, ALT, ALKPHOS, BILITOT, BILIDIR, IBILI in the last 72 hours. PT/INR No results for input(s): LABPROT, INR in the last 72 hours. Hepatitis Panel No results for input(s): HEPBSAG, HCVAB, HEPAIGM, HEPBIGM in the last 72 hours.    IMPRESSION:  #69 68 year old African-American male with 1 episode of grossly melenic appearing stool  today Hemoglobin 14 today, Hemoccult pending Has not been on anticoagulation during this admission  He had been on high-dose steroids increasing risk for gastritis, or ulcer.  #2 status postresection of T1/2 spinal mass lesion on 08/14/2024-there was concern that this was a malignant lesion but fortunately path showed this to be a hematoma which occurred in the setting of chronic Eliquis . He had presented with concerns for rapidly progressive thoracic myelopathy  #3 history of atrial fibrillation for which she has been on chronic Eliquis  #4.  Hypertension #5.  Prior history of DVT #6.  History of hepatitis C with compensated cirrhosis per notes-treated 2017-last ultrasound 2019 showed normal hepatic echotexture. 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 past midnight Hemoglobin every 8 hours, and transfuse as indicated Continue to hold Eliquis  And IV PPI twice daily Patient will be scheduled for EGD with Dr. Legrand for tomorrow 08/21/2024.  Procedure has been discussed in detail with the patient including indications risks and benefits and he is agreeable to proceed.  He is disappointed that he is having another issue that is delaying his transfer to rehab but understands.  Patient should have MRI of his spleen for further evaluation of the splenic lesion noted on CT during this admission. GI will follow with you   Amy Esterwood PA-C 08/20/2024, 4:35 PM  I have taken an interval history, thoroughly reviewed the chart and examined the patient. I agree with the Advanced Practitioner's note, impression and recommendations, and have recorded additional findings, impressions and recommendations below. I performed a substantive portion of this encounter (>50% time spent),  including a complete performance of the medical decision making.  My additional thoughts are as follows:  Reported melena today, hemodynamically stable, which is hemoglobin 14 and actually increased from 5 days prior. Has been off his Eliquis  for over a week.  No localizing symptoms for location of GI blood loss.  He had some lower abdominal pressure and bloating last evening but that was relieved after Foley catheter placed.  He is planned for an upper endoscopy tomorrow and was agreeable after discussion of the procedure and risks.  His wife present at the bedside during my visit.  If no source on EGD, he will need a colonoscopy following day. _________________  This consultation required a high degree of medical decision making due to the nature and complexity of the acute condition(s) being evaluated as well as the patient's medical comorbidities.  Endoscopic procedure planned.  Victory LITTIE Legrand III [phone]

## 2024-08-20 NOTE — TOC Progression Note (Addendum)
 Transition of Care [facility]) - Progression Note    Patient Details  Name: Dustin [name] MRN: [id number] Date of Birth: 01-01-1956  [facility] CM/SW Contact  Montie LOISE Louder, [place] Phone Number: 08/20/2024, 12:44 PM  Clinical Narrative:     Received insurance authorization 10/21 - 10/23  Reference # [id number] [id number] PI#[id number]   [facility] confirmed they can admit today. CSW met with patient provided update. He requested to give [facility] his kelby Melnick # [phone] , so she can go and complete admission paperwork.  Montie Louder, MSW, LCSW Clinical Social Worker      Expected Discharge Plan: IP Rehab Facility Barriers to Discharge: Barriers Resolved               Expected Discharge Plan and Services         Expected Discharge Date: 08/20/24                                     Social Drivers of Health (SDOH) Interventions SDOH Screenings   Food Insecurity: No Food Insecurity (05/01/2024)  Housing: Unknown (05/01/2024)  Transportation Needs: No Transportation Needs (05/01/2024)  Utilities: Not At Risk (03/25/2024)  Alcohol Screen: Low Risk  (05/01/2024)  Depression (PHQ2-9): Low Risk  (08/05/2024)  Financial Resource Strain: Low Risk  (05/01/2024)  Physical Activity: Sufficiently Active (05/01/2024)  Social Connections: Moderately Isolated (05/01/2024)  Stress: No Stress Concern Present (05/01/2024)  Tobacco Use: Low Risk  (08/14/2024)  Health Literacy: Adequate Health Literacy (03/25/2024)    Readmission Risk Interventions     No data to display

## 2024-08-20 NOTE — Discharge Summary (Signed)
 Physician Discharge Summary  Dustin [name] FMW:[id number] DOB: July 07, 1956 DOA: 08/08/2024  PCP: Delbert Clam, MD  Admit date: 08/08/2024 Discharge date: 08/20/2024  Admitted From: Home Disposition: Short-term rehab  Recommendations for Outpatient Follow-up:  Follow up with PCP in 1-2 weeks  Home Health: none Equipment/Devices: none  Discharge Condition: stable CODE STATUS: Full code Diet Orders (From admission, onward)     Start     Ordered   08/14/24 2133  Diet regular Room service appropriate? Yes; Fluid consistency: Thin  Diet effective now       Question Answer Comment  Room service appropriate? Yes   Fluid consistency: Thin      08/14/24 2132            HPI: 68 y.o. M with HTN, compensated hep C cirrhosis, hx VTE on Eliquis , and hx NET in 2017 who presented with paresthesias, found to have thoracic spine lesion.  He underwent surgery on 10/16  Hospital Course / Discharge diagnoses: Principal problem T2 intramedullary spinal lesion - Status post intramedullary mass resection and laminectomy on 10/16 by Dr. Darnella.  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  Has been placed on dexamethasone  taper, continue as per neurosurgery.  He will need rehab   Active problems Persistent atrial fibrillation - Patient was evaluated in the ER for vomiting in July 2025, found to have new onset atrial flutter, started on Eliquis .  Seen in the atrial fibrillation clinic, and was in persistent A-fib. 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 Given his CHA2DS2-VASc score of only 2, I recommend consideration of NOT resuming anticoagulation at that time, or consideration of a watchman. Urinary  retention - Due to spinal lesion.  Continue Foley on discharge, failed voiding trial 10/21.  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  Hyponatremia - Mild, asymptomatic History of DVT - Patient had a nonocclusive brachial vein DVT after hand surgery in 2012.  No clots since then.  Treated with anticoagulation at that time, then stopped.  Recently anticoagulation resumed in setting of A-fib, see #2 above.  Sepsis ruled out   Discharge Instructions   Allergies as of 08/20/2024   No Known Allergies      Medication List     STOP taking these medications    acetaminophen -codeine  300-30 MG tablet Commonly known as: TYLENOL  #3   apixaban  5 MG Tabs tablet Commonly known as: ELIQUIS    traMADol  50 MG tablet Commonly known as: ULTRAM        TAKE these medications    acetaminophen  500 MG tablet Commonly known as: TYLENOL  Take 1 tablet (500 mg total) by mouth every 6 (six) hours as needed. What changed: reasons to take this   albuterol  108 (90 Base) MCG/ACT inhaler Commonly known as: VENTOLIN  HFA INHALE 2 PUFFS INTO THE LUNGS EVERY 6 HOURS AS NEEDED FOR WHEEZING OR SHORTNESS OF BREATH   amLODipine  10 MG tablet Commonly known as: NORVASC  Take 1 tablet (10 mg total) by mouth daily.   atorvastatin  20 MG tablet Commonly known  as: LIPITOR TAKE 1 TABLET(20 MG) BY MOUTH DAILY   carvedilol  6.25 MG tablet Commonly known as: COREG  TAKE 1 TABLET(6.25 MG) BY MOUTH TWICE DAILY WITH A MEAL   cetirizine  10 MG tablet Commonly known as: ZYRTEC  Take 1 tablet (10 mg total) by mouth daily. What changed:  when to take this reasons to take this   dexamethasone  1 MG tablet Commonly known as: DECADRON  Take 2 tablets (2 mg total) by mouth  every 12 (twelve) hours for 1 day, THEN 2 tablets (2 mg total) daily for 3 days, THEN 1 tablet (1 mg total) daily for 3 days. Start taking on: August 20, 2024   diclofenac  Sodium 1 % Gel Commonly known as: VOLTAREN  APPLY 4 GRAMS EXTERNALLY TO THE AFFECTED AREA FOUR TIMES DAILY What changed:  how much to take how to take this when to take this reasons to take this   fluticasone  50 MCG/ACT nasal spray Commonly known as: FLONASE  PLACE 1 SPRAY IN BOTH NOSTRILS DAILY What changed: See the new instructions.   hydrochlorothiazide  25 MG tablet Commonly known as: HYDRODIURIL  Take 1 tablet (25 mg total) by mouth daily.   omeprazole  20 MG capsule Commonly known as: PRILOSEC TAKE 1 CAPSULE(20 MG) BY MOUTH DAILY   oxyCODONE -acetaminophen  7.5-325 MG tablet Commonly known as: PERCOCET Take 1 tablet by mouth every 4 (four) hours as needed for moderate pain (pain score 4-6) or severe pain (pain score 7-10).        Follow-up Information     Darnella Dorn SAUNDERS, MD Follow up.   Specialty: Neurosurgery Why: please call to schedule a post operative appointment 6 weeks from surgery. Thank you Contact information: [address], [address] [place] [address] [phone]                 Consultations: Neurosurgery Oncology  Procedures/Studies:  MR THORACIC SPINE W WO CONTRAST Result Date: 08/15/2024 EXAM: MRI THORACIC SPINE WITH AND WITHOUT INTRAVENOUS CONTRAST 08/14/2024 11:40:05 PM TECHNIQUE: Multiplanar multisequence MRI of the thoracic spine was performed with and without the administration of intravenous contrast. COMPARISON: MRI of the thoracic spine dated 08/08/2024. CLINICAL HISTORY: Brain/CNS neoplasm, monitor. FINDINGS: BONES AND ALIGNMENT: Normal alignment. Normal vertebral body heights. Bone marrow signal is unremarkable, except for a hemangioma again noted within the L1 vertebral body. No abnormal enhancement. Since the previous study, the patient has undergone  bilateral laminectomies at T2 and bilateral laminotomies at T1 and T3 for resection of an enhancing intramedullary lesion at the T2 vertebral body level. SPINAL CORD: The previously noted enhancing lesion at T1-T2 is no longer appreciated. There is persistent abnormal T2 signal present within the spinal cord extending from C6-C7 to the T8 vertebral body level. SOFT TISSUES: Unremarkable. DEGENERATIVE CHANGES: No significant disc herniation. No spinal canal stenosis or neural foraminal narrowing. IMPRESSION: 1. Status post bilateral laminectomies at T2 and bilateral laminotomies at T1 and T3 for resection of an enhancing intramedullary lesion at the T1-2 vertebral body level. 2. Persistent abnormal T2 signal within the spinal cord extending from C6-7 to the T8 vertebral body level. No residual enhancing lesion at T1-2. Electronically signed by: Evalene Coho MD 08/15/2024 04:58 AM EDT RP Workstation: [id number]   DG Thoracic Spine 2 View Result Date: 08/14/2024 EXAM: FLUOROSCOPIC IMAGES, 2 VIEWS TECHNIQUE: Fluoroscopy was provided by the radiology department for procedure. Radiologist was not present during examination. FLUOROSCOPY DOSE AND TYPE: Radiation Dose Index: Reference Air Kerma (in mGy) = 1.5 Fluoroscopy Time (in seconds) = 7.7 COMPARISON: None  available. CLINICAL HISTORY: [id number] Surgery, elective [id number]. Dr. Darnella; T1-T3 LAM FOR INTRADURAL MASS ; RSTO : CMP, HH; Fl time 7.7 seconds; mGy: 1.5 FINDINGS: Intraoperative fluoroscopic imaging during T1-3 laminectomy for mass resection. IMPRESSION: 1. Intraoperative fluoroscopic spot images, as above. NOTE: Intraoperative fluoroscopic spot images as above. Please refer to the intraoperative report for full details. Electronically signed by: Pinkie Pebbles MD 08/14/2024 08:49 PM EDT RP Workstation: [id number]   DG C-Arm 1-60 Min-No Report Result Date: 08/14/2024 Fluoroscopy was utilized by the requesting physician.  No radiographic interpretation.    DG C-Arm 1-60 Min-No Report Result Date: 08/14/2024 Fluoroscopy was utilized by the requesting physician.  No radiographic interpretation.   DG C-Arm 1-60 Min-No Report Result Date: 08/14/2024 Fluoroscopy was utilized by the requesting physician.  No radiographic interpretation.   US  Intraoperative Result Date: 08/14/2024 CLINICAL DATA:  Ultrasound was provided for use by the ordering physician.  No provider Interpretation or professional fees incurred.    MR ABDOMEN W WO CONTRAST Result Date: 08/12/2024 CLINICAL DATA:  Splenic mass EXAM: MRI ABDOMEN WITHOUT AND WITH CONTRAST TECHNIQUE: Multiplanar multisequence MR imaging of the abdomen was performed both before and after the administration of intravenous contrast. CONTRAST:  9.5mL GADAVIST GADOBUTROL 1 MMOL/ML IV SOLN COMPARISON:  08/08/2024 CT scan and report from MRI abdomen 07/18/2016 FINDINGS: Despite efforts by the technologist and patient, motion artifact is present on today's exam and could not be eliminated. This reduces exam sensitivity and specificity. Lower chest: Scarring or atelectasis in the right lower lobe. Hepatobiliary: Unremarkable Pancreas:  Unremarkable Spleen: Medially in the spleen, a 5.9 by 5.5 by 5.1 cm (volume = 87 cm^3) mass demonstrates arterial portal venous phase hyperenhancement relative to the rest of the spleen, and is only faintly hyperenhancing on delayed phase 3 minute images. At 5 minutes postcontrast this appears to be enhancing similar to the rest of the spleen. The lesion is very minimally hyperintense to the splenic parenchyma although nearly isointense on T2 weighted images and similar to the rest of the spleen on diffusion-weighted images. The lesion is isointense to the spleen on precontrast T1 weighted images. Referring back to the 07/18/2016 exam this lesion is subtle and most conspicuous on arterial phase images where it measures 3.7 by 3.4 by 4.0 cm (volume = 26 cm^3). Adrenals/Urinary Tract:   Unremarkable Stomach/Bowel: Sigmoid colon diverticulosis. Vascular/Lymphatic:  Unremarkable Other:  No supplemental non-categorized findings. Musculoskeletal: Lumbar spondylosis and degenerative disc disease. IMPRESSION: 1. The splenic lesion is very faintly retrospectively detectable on the 07/18/2016 exam but has enlarged, currently averaging about 5.5 cm in diameter and previously about 3.7 cm on the exam from 8 years ago. 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 Periodic surveillance may be warranted as splenic hamartomas can cause symptoms if they grow large. 2. Sigmoid colon diverticulosis. 3. Lumbar spondylosis and degenerative disc disease. 4. Scarring or atelectasis in the right lower lobe. Electronically Signed   By: Ryan Salvage M.D.   On: 08/12/2024 14:03   MR CERVICAL SPINE W WO CONTRAST Result Date: 08/09/2024 EXAM: MRI CERVICAL SPINE WITH AND WITHOUT CONTRAST 08/09/2024 02:43:55 PM TECHNIQUE: Multiplanar multisequence MRI of the cervical spine was performed without and with the administration of 9 mL of gadobutrol (GADAVIST) 1 MMOL/ML intravenous contrast. COMPARISON: MRI of the thoracic spine 08/08/2024. CLINICAL HISTORY: Metastatic disease evaluation. FINDINGS: BONES AND ALIGNMENT: Straightening of the normal cervical lordosis is present. Normal vertebral body heights. Marrow signal is unremarkable. Chronic type 2 Modic changes are present at C4-C5 and  C6-C7. No abnormal enhancement. SPINAL CORD: Intrinsic cord edema extends superiorly to the level of C1-C2. Subtle peripheral enhancement is present within an intramedullary lesion extending from T1-T2 through mid body of T1 measuring 5 x 17 mm. No other pathologic enhancement or cord enlargement is present. SOFT TISSUES: No paraspinal mass. C2-C3: Complex at C2-C3 effaces the ventral CSF. Severe right and moderate left foraminal stenosis is present. C3-C4: A broad-based disc osteophyte complex and facet hypertrophy  contributes to moderate central and right greater than left foraminal narrowing. The central canal is narrowed 8 mm. C4-C5: A broad-based disc osteophyte complex effacement of the ventral CSF at C4-C5. Severe foraminal stenosis is present bilaterally. C5-C6: Broad-based disc osteophyte complex is asymmetric to the left at C5-C6. A central disc protrusion narrows the canal centrally to 7 mm. Severe left and moderate right foraminal stenosis is present. C6-C7: No significant disc herniation. No spinal canal stenosis or neural foraminal narrowing. C7-T1: No significant disc herniation. No spinal canal stenosis or neural foraminal narrowing. IMPRESSION: 1. Intrinsic cord edema extending superiorly to the level of C1-2. 2. Subtle peripheral enhancement within an intramedullary lesion extending from T1-2 through the mid body of T1 measuring 5 x 17 mm. 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 3. Moderate central canal narrowing at C3-4 with canal diameter of approximately 8 mm. 4. Central canal narrowing at C5-6 to approximately 7 mm due to a central disc protrusion. 5. Severe foraminal stenosis bilaterally at C4-5. 6. Severe left and moderate right foraminal stenosis at C5-6. 7. Severe right and moderate left foraminal stenosis at C2-3. Electronically signed by: Lonni Necessary MD 08/09/2024 03:05 PM EDT RP Workstation: [id number]   CT CHEST ABDOMEN PELVIS W CONTRAST Result Date: 08/08/2024 CLINICAL DATA:  Metastatic disease evaluation. History of spinal cord neoplasm. Weakness. EXAM: CT CHEST, ABDOMEN, AND PELVIS WITH CONTRAST TECHNIQUE: Multidetector CT imaging of the chest, abdomen and pelvis was performed following the standard protocol during bolus administration of intravenous contrast. RADIATION DOSE REDUCTION: This exam was performed according to the departmental dose-optimization program which includes automated exposure  control, adjustment of the mA and/or kV according to patient size and/or use of iterative reconstruction technique. CONTRAST:  75mL OMNIPAQUE IOHEXOL 350 MG/ML SOLN COMPARISON:  CT abdomen and pelvis 08/08/2024. Chest radiographs 04/09/2024 FINDINGS: CT CHEST FINDINGS Cardiovascular: Normal heart size. No pericardial effusions. Normal caliber thoracic aorta. No aortic dissection. Great vessel origins are patent. Central pulmonary arteries are patent without evidence of significant pulmonary embolus. Mediastinum/Nodes: Esophagus is decompressed. No significant lymphadenopathy. Thyroid  gland is unremarkable. Lungs/Pleura: There is a small focal area of consolidation in the right lung base associated with an area of mucous plugging. This could represent focal pneumonia or aspiration. Focal scarring in the anterior left lung base. No significant pulmonary nodules or mass lesions identified. No pleural effusion or pneumothorax. Musculoskeletal: Degenerative changes in the spine. No focal bone lesions. CT ABDOMEN PELVIS FINDINGS Hepatobiliary: Mild diffuse fatty infiltration of the liver. No focal lesions. Gallbladder and bile ducts are normal. Pancreas: Unremarkable. No pancreatic ductal dilatation or surrounding inflammatory changes. Spleen: There is a large mostly homogeneous hyperenhancing lesion centrally in the spleen, measuring 6.5 x 6.5 cm in diameter. Adrenals/Urinary Tract: No adrenal gland nodules. Kidneys are symmetrical. Homogeneous nephrograms. No solid mass lesions. No hydronephrosis or hydroureter. Bladder is normal. Stomach/Bowel: Stomach, small bowel, and colon are not abnormally distended. No wall thickening or inflammatory stranding are appreciated. Appendix is normal.  Diverticulosis of the sigmoid colon without evidence of acute diverticulitis. Vascular/Lymphatic: No significant vascular findings are present. No enlarged abdominal or pelvic lymph nodes. Reproductive: Prostate gland is enlarged,  measuring 5.6 cm diameter. Other: No free air or free fluid in the abdomen. Abdominal wall musculature appears intact. Musculoskeletal: Degenerative changes in the spine. No focal bone lesions. IMPRESSION: 1. Focal area of infiltration associated with mucous plugging in the right lung base. This may represent aspiration or pneumonia. 2. No evidence of metastatic disease in the chest. 3. Large hyperenhancing lesion in the spleen. This was not present on the prior MRI abdomen from 07/18/2016. Differential diagnosis would include hemangioma, hamartoma, metastasis, or less likely lymphoma. Consider MRI for further evaluation. 4. No other possible metastatic lesions are identified in the abdomen or pelvis. 5. Prostate gland is enlarged. Electronically Signed   By: Elsie Gravely M.D.   On: 08/08/2024 15:35   MR THORACIC SPINE W WO CONTRAST Result Date: 08/08/2024 EXAM: MRI THORACIC SPINE WITH AND WITHOUT INTRAVENOUS CONTRAST 08/08/2024 12:40:09 PM TECHNIQUE: Multiplanar multisequence MRI of the thoracic spine was performed with and without the administration of intravenous contrast. 9 mL (gadobutrol (GADAVIST) 1 MMOL/ML injection 9 mL GADOBUTROL 1 MMOL/ML IV SOLN) was administered. COMPARISON: None available. CLINICAL HISTORY: Mid-back pain, neuro deficit. FINDINGS: BONES AND ALIGNMENT: Normal alignment. Normal vertebral body heights. An 11 mm hemangioma is present superiorly at T12. Bone marrow signal is otherwise unremarkable. No other abnormal enhancement. SPINAL CORD: Mildly irregular intramedullary enhancement is present in the spinal cord from T1-2 through the mid body of T2 measuring 5 x 13 mm. Extensive cord edema extends superiorly to the highest image level, C6-7 and inferiorly to the level of T7. No other focal enhancement or cord enlargement is present. SOFT TISSUES: Unremarkable. DEGENERATIVE CHANGES: No significant focal disc protrusion or stenosis is present. No neural foraminal narrowing.  IMPRESSION: 1. Mildly irregular intramedullary enhancement from T12 to mid T2 (5 x 13 mm) with extensive surrounding cord edema extending superiorly to C67 and inferiorly to T7. his could represent an intrinsic cord metastasis, primary spinal cord neoplasm is favored. This may represent an ependymoma or spinal cord astrocytoma. 2. No significant focal disc protrusion or stenosis. Electronically signed by: Lonni Necessary MD 08/08/2024 01:17 PM EDT RP Workstation: [id number]   MR Brain W and Wo Contrast Result Date: 08/08/2024 CLINICAL DATA:  Provided history: Metastatic disease evaluation. EXAM: MRI HEAD WITHOUT AND WITH CONTRAST TECHNIQUE: Multiplanar, multiecho pulse sequences of the brain and surrounding structures were obtained without and with intravenous contrast. CONTRAST:  9mL GADAVIST GADOBUTROL 1 MMOL/ML IV SOLN COMPARISON:  Head CT 08/08/2012. FINDINGS: The axial T1-weighted post-contrast sequence is moderate-to-severely motion degraded. The coronal T1-weighted post-contrast sequence is moderately motion degraded. Within these limitations, findings are as follows. Brain: Mild generalized cerebral atrophy. Multifocal T2 FLAIR hyperintense signal abnormality within the cerebral white matter, nonspecific but compatible with minimal chronic small vessel ischemic disease. No cortical encephalomalacia is identified. There is no acute infarct. No evidence of an intracranial mass. No chronic intracranial blood products. No extra-axial fluid collection. No midline shift. Within the limitations of motion degraded post-contrast imaging, there is no pathologic intracranial enhancement. Vascular: Maintained flow voids within the proximal large arterial vessels. Skull and upper cervical spine: No focal worrisome marrow lesion. Incompletely assessed cervical spondylosis. Partially imaged edema within the cervical spinal cord extending inferiorly from the C2 level. Sinuses/Orbits: No mass or acute finding  within the imaged orbits. Moderate mucosal thickening within the right maxillary  sinus. 18 mm mucous retention cyst, and mild background mucosal thickening, within the left maxillary sinus. Severe mucosal thickening within the right sphenoid sinus. Small fluid level within the left sphenoid sinus. Bilateral ethmoid sinusitis (mild right, moderate left). Moderate right frontal sinusitis. IMPRESSION: 1. Motion degraded post-contrast imaging as described. Within this limitation, there is no evidence of intracranial metastatic disease. 2. Minor chronic small vessel ischemic changes within the cerebral white matter. 3. Mild generalized cerebral atrophy. 4. Paranasal sinus disease as outlined within the body of the report. 5. Partially imaged edema within the cervical spinal cord extending inferiorly from the C2 level. Correlate with findings on same-day thoracic spine MRI and consider a dedicated cervical spine MRI (with and without contrast) for further evaluation. Electronically Signed   By: Rockey Childs D.O.   On: 08/08/2024 13:13   MR Lumbar Spine W Wo Contrast Result Date: 08/08/2024 EXAM: MRI LUMBAR SPINE 08/08/2024 12:43:22 PM TECHNIQUE: Multiplanar multisequence MRI of the lumbar spine was performed with and without the administration of 9 mL gadobutrol (GADAVIST) 1 MMOL/ML injection. COMPARISON: None available. CLINICAL HISTORY: Low back pain, cauda equina syndrome suspected. FINDINGS: BONES AND ALIGNMENT: Straightening of the normal lumbar lordosis is present. Normal vertebral body heights. Bone marrow signal: Mixed edematous and chronic type 2 Modic changes are present at L2-L3. A small hemangioma is present at L1-L2. Chronic type 2 Modic changes are present posteriorly and asymmetrically on the right at L5-S1. Slight retrolisthesis at L5-S1 is stable. SPINAL CORD: The conus medullaris terminates at L1. SOFT TISSUES: No paraspinal mass. L1-L2: A small hemangioma is present at L1-L2. No significant disc  herniation. No spinal canal stenosis or neural foraminal narrowing. L2-L3: Mixed edematous and chronic type 2 Modic changes are present at L2-L3. A broad-based disc protrusion and chronic loss of disc height is present at L2-L3. Moderate facet hypertrophy contributes to moderate foraminal narrowing bilaterally. L3-L4: Broad-based disc protrusion and moderate bilateral facet hypertrophy results in moderate right and mild left subarticular narrowing at L3-L4. Moderate foraminal stenosis is worse on the right. L4-L5: A broad-based disc protrusion is asymmetric to the left at L4-L5. Moderate facet hypertrophy is present. Moderate left-to-mild right subarticular narrowing is present. Moderate foraminal narrowing is present bilaterally. L5-S1: Slight retrolisthesis at L5-S1 is stable. Chronic type 2 Modic changes are present posteriorly and asymmetrically on the right at L5-S1. Facet hypertrophy contributes to moderate foraminal narrowing bilaterally. No significant disc herniation. No spinal canal stenosis. IMPRESSION: 1. No evidence of cauda equina syndrome. 2. Multilevel degenerative changes with moderate foraminal stenosis at L2-3 and L3-4, and moderate foraminal narrowing at L4-5 and L5-S1. Electronically signed by: Lonni Necessary MD 08/08/2024 01:08 PM EDT RP Workstation: [id number]   CT ABDOMEN PELVIS WO CONTRAST Result Date: 08/08/2024 CLINICAL DATA:  Anuria. EXAM: CT ABDOMEN AND PELVIS WITHOUT CONTRAST TECHNIQUE: Multidetector CT imaging of the abdomen and pelvis was performed following the standard protocol without IV contrast. RADIATION DOSE REDUCTION: This exam was performed according to the departmental dose-optimization program which includes automated exposure control, adjustment of the mA and/or kV according to patient size and/or use of iterative reconstruction technique. COMPARISON:  July 08, 2016. FINDINGS: Lower chest: No acute abnormality. Hepatobiliary: No focal liver abnormality is  seen. No gallstones, gallbladder wall thickening, or biliary dilatation. Pancreas: Unremarkable. No pancreatic ductal dilatation or surrounding inflammatory changes. Spleen: Normal in size without focal abnormality. Adrenals/Urinary Tract: Adrenal glands and kidneys are unremarkable. No hydronephrosis or renal obstruction is noted. No renal or ureteral calculi are noted.  Mild urinary bladder distention is noted. Stomach/Bowel: Stomach is within normal limits. Appendix appears normal. No evidence of bowel wall thickening, distention, or inflammatory changes. Moderate amount of stool seen throughout the colon. Vascular/Lymphatic: No significant vascular findings are present. No enlarged abdominal or pelvic lymph nodes. Reproductive: Mild prostatic enlargement is noted. Other: No abdominal wall hernia or abnormality. No abdominopelvic ascites. Musculoskeletal: No acute or significant osseous findings. IMPRESSION: 1. Mild urinary bladder distention is noted. 2. Mild prostatic enlargement. 3. Moderate stool burden. Electronically Signed   By: Lynwood Landy Raddle M.D.   On: 08/08/2024 08:15     Subjective: - no chest pain, shortness of breath, no abdominal pain, nausea or vomiting.   Discharge Exam: BP 102/65 (BP Location: Right Arm)   Pulse 83   Temp 97.7 F (36.5 C) (Oral)   Resp 14   Ht 5' 11 (1.803 m)   Wt 95.3 kg   SpO2 97%   BMI 29.30 kg/m   General: Pt is alert, awake, not in acute distress Cardiovascular: RRR, S1/S2 +, no rubs, no gallops Respiratory: CTA bilaterally, no wheezing, no rhonchi Abdominal: Soft, NT, ND, bowel sounds + Extremities: no edema, no cyanosis    The results of significant diagnostics from this hospitalization (including imaging, microbiology, ancillary and laboratory) are listed below for reference.     Microbiology: Recent Results (from the past 240 hours)  Surgical pcr screen     Status: Abnormal   Collection Time: 08/14/24  1:29 AM   Specimen: Nasal Mucosa;  Nasal Swab  Result Value Ref Range Status   MRSA, PCR NEGATIVE NEGATIVE Final   Staphylococcus aureus POSITIVE (A) NEGATIVE Final    Comment: (NOTE) The Xpert SA Assay (FDA approved for NASAL specimens in patients 36 years of age and older), is one component of a comprehensive surveillance program. It is not intended to diagnose infection nor to guide or monitor treatment. Performed at [facility], [address] [place], [place] [address]      Labs: Basic Metabolic Panel: Recent Labs  Lab 08/14/24 1652 08/15/24 0519 08/19/24 1008  NA 131* 131* 130*  K 4.1 4.2 3.9  CL  --  94* 93*  CO2  --  27 29  GLUCOSE  --  155* 170*  BUN  --  23 22  CREATININE  --  0.86 0.87  CALCIUM   --  8.7* 8.6*   Liver Function Tests: No results for input(s): AST, ALT, ALKPHOS, BILITOT, PROT, ALBUMIN in the last 168 hours. CBC: Recent Labs  Lab 08/13/24 1950 08/14/24 1652 08/15/24 0519  WBC 12.0*  --  10.2  NEUTROABS 9.4*  --   --   HGB 15.7 12.9* 12.7*  HCT 45.2 38.0* 37.2*  MCV 87.3  --  87.7  PLT 341  --  289   CBG: No results for input(s): GLUCAP in the last 168 hours. Hgb A1c No results for input(s): HGBA1C in the last 72 hours. Lipid Profile No results for input(s): CHOL, HDL, LDLCALC, TRIG, CHOLHDL, LDLDIRECT in the last 72 hours. Thyroid  function studies No results for input(s): TSH, T4TOTAL, T3FREE, THYROIDAB in the last 72 hours.  Invalid input(s): FREET3 Urinalysis    Component Value Date/Time   COLORURINE YELLOW 04/23/2015 1023   APPEARANCEUR CLEAR 04/23/2015 1023   LABSPEC 1.020 04/23/2015 1023   PHURINE 6.0 04/23/2015 1023   GLUCOSEU NEGATIVE 04/23/2015 1023   HGBUR NEGATIVE 04/23/2015 1023   BILIRUBINUR NEGATIVE 04/23/2015 1023   KETONESUR NEGATIVE 04/23/2015 1023  PROTEINUR NEGATIVE 04/23/2015 1023   UROBILINOGEN 0.2 04/23/2015 1023   NITRITE NEGATIVE 04/23/2015 1023   LEUKOCYTESUR NEGATIVE 04/23/2015 1023     FURTHER DISCHARGE INSTRUCTIONS:   Get Medicines reviewed and adjusted: Please take all your medications with you for your next visit with your Primary MD   Laboratory/radiological data: Please request your Primary MD to go over all hospital tests and procedure/radiological results at the follow up, please ask your Primary MD to get all Hospital records sent to his/her office.   In some cases, they will be blood work, cultures and biopsy results pending at the time of your discharge. Please request that your primary care M.D. goes through all the records of your hospital data and follows up on these results.   Also Note the following: If you experience worsening of your admission symptoms, develop shortness of breath, life threatening emergency, suicidal or homicidal thoughts you must seek medical attention immediately by calling 911 or calling your MD immediately  if symptoms less severe.   You must read complete instructions/literature along with all the possible adverse reactions/side effects for all the Medicines you take and that have been prescribed to you. Take any new Medicines after you have completely understood and accpet all the possible adverse reactions/side effects.    Do not drive when taking Pain medications or sleeping medications (Benzodaizepines)   Do not take more than prescribed Pain, Sleep and Anxiety Medications. It is not advisable to combine anxiety,sleep and pain medications without talking with your primary care practitioner   Special Instructions: If you have smoked or chewed Tobacco  in the last 2 yrs please stop smoking, stop any regular Alcohol  and or any Recreational drug use.   Wear Seat belts while driving.   Please note: You were cared for by a hospitalist during your hospital stay. Once you are discharged, your primary care physician will handle any further medical issues. Please note that NO REFILLS for any discharge medications will be authorized  once you are discharged, as it is imperative that you return to your primary care physician (or establish a relationship with a primary care physician if you do not have one) for your post hospital discharge needs so that they can reassess your need for medications and monitor your lab values.  Time coordinating discharge: 35 minutes  SIGNED:  Nilda Fendt, MD, PhD 08/20/2024, 11:34 AM

## 2024-08-20 NOTE — Consult Note (Addendum)
 Consultation  Referring Provider: [facility]/ Gherge Primary Care Physician:  Delbert Clam, MD Primary Gastroenterologist:  unassigned  Reason for Consultation: Melena  HPI: Dustin [name] is a 68 y.o. male with history of atrial fibrillation for which he has been on chronic Eliquis , also with history of hypertension, history of hepatitis C with cirrhosis, compensated and previous DVT.  Patient was admitted 12 days ago after he had presented to the ER with complaints of left leg tingling and balance issues. Workup revealed a thoracic spine lesion, neurosurgery was consulted and operative resection was recommended.  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  Given that this had recurred in the setting of Eliquis  neurosurgery recommended not continuing his therapeutic anticoagulation and if this needed to absolutely be restarted to wait at least 1 month. He was planned to be transferred to rehab today.  Unfortunately earlier today he was reported to have passed a large black melenic appearing stool and we were asked to evaluate. He says that he had not had a bowel movement for few days but has not noticed any melena or hematochezia during his stay and was not having any GI issues at home.  His last hemoglobin had been checked on 1017 and was 12.7/hematocrit 37.2/MCV 87.  Labs have returned this afternoon-WBC 17.9/hemoglobin 14/hematocrit 41.5/MCV 88.9/platelets 324 Stool for Hemoccult is pending Be met yesterday showed a sodium 130/potassium 3.9/BUN 22/creatinine 0.87.  He relates that last night he was very bloated and uncomfortable but was also unable to empty his bladder.  He had Foley catheter placed which resolved his symptoms.  Other than that he is not having any abdominal pain denies any problems  with nausea or vomiting no heartburn or indigestion, no dysphagia.  He had been on high-dose steroids during this admission.  He had undergone previous EGD and colonoscopy in 2017 per Dr. Aneita.  At EGD noted to have a normal esophagus, and mild gastritis Path was pertinent for H. pylori.  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 At colonoscopy there was a 6 mm tubular adenoma removed and he was noted to have a few diverticuli.  CT of the chest abdomen and pelvis as part of his workup during this admission shows a normal-appearing esophagus, no lymphadenopathy, mild diffuse fatty infiltration of the liver, gallbladder and bile ducts appear normal, large mostly homogeneous appearing hyperenhancing lesion in the spleen measuring 6.5 x 6.5 cm, no bowel wall thickening or inflammatory changes noted, diverticulosis without diverticulitis no free fluid-I recommended for further evaluation of the splenic lesion.   Past Medical History:  Diagnosis Date   Arthritis    bil knees, left hand   Atrial fibrillation (HCC)    Carcinoid tumor (HCC)    DVT of upper extremity (deep vein thrombosis) (HCC)    left brachial vein, 12/2010   Helicobacter pylori gastritis    History of blood clots    to left arm   Hypertension    Shortness of breath dyspnea    with exertion   Tubular adenoma of colon 2017    Past Surgical History:  Procedure Laterality Date   colonscopy      1 year ago    KNEE ARTHROPLASTY Right 04/30/2015   Procedure: COMPUTER ASSISTED TOTAL KNEE ARTHROPLASTY;  Surgeon: Oneil JAYSON Herald, MD;  Location: [facility] OR;  Service: Orthopedics;  Laterality: Right;   LAMINECTOMY N/A 08/14/2024   Procedure: THORACIC ONE-THREE LAMINECTOMY FOR TUMOR;  Surgeon: Darnella Dorn SAUNDERS, MD;  Location: [facility] OR;  Service: Neurosurgery;  Laterality: N/A;  T1-T3 LAM FOR INTRADURAL MASS   Left  Hand   2011   cyst removed.   OPERATIVE ULTRASOUND  08/14/2024   Procedure: US  INTRAOPERATIVE;  Surgeon:  Darnella Dorn SAUNDERS, MD;  Location: [facility] OR;  Service: Neurosurgery;;   TOTAL KNEE ARTHROPLASTY  10/19/2011   Procedure: TOTAL KNEE ARTHROPLASTY;  Surgeon: Rome JULIANNA Pepper;  Location: [facility] OR;  Service: Orthopedics;  Laterality: Left;    Prior to Admission medications   Medication Sig Start Date End Date Taking? Authorizing Provider  acetaminophen  (TYLENOL ) 500 MG tablet Take 1 tablet (500 mg total) by mouth every 6 (six) hours as needed. Patient taking differently: Take 500 mg by mouth every 6 (six) hours as needed for mild pain (pain score 1-3) or moderate pain (pain score 4-6) (tooth pains). 06/08/24  Yes Nivia Colon, PA-C  acetaminophen -codeine  (TYLENOL  #3) 300-30 MG tablet Take 1 tablet by mouth every 6 (six) hours as needed for moderate pain (pain score 4-6) or severe pain (pain score 7-10) (tooth pains).   Yes [provider]  albuterol  (VENTOLIN  HFA) 108 (90 Base) MCG/ACT inhaler INHALE 2 PUFFS INTO THE LUNGS EVERY 6 HOURS AS NEEDED FOR WHEEZING OR SHORTNESS OF BREATH 07/06/24  Yes Newlin, Enobong, MD  amLODipine  (NORVASC ) 10 MG tablet Take 1 tablet (10 mg total) by mouth daily. 01/02/24  Yes Danton Jon HERO, PA-C  apixaban  (ELIQUIS ) 5 MG TABS tablet Take 1 tablet (5 mg total) by mouth 2 (two) times daily. 04/17/24  Yes Terra Fairy PARAS, PA-C  atorvastatin  (LIPITOR) 20 MG tablet TAKE 1 TABLET(20 MG) BY MOUTH DAILY 03/26/24  Yes Newlin, Enobong, MD  carvedilol  (COREG ) 6.25 MG tablet TAKE 1 TABLET(6.25 MG) BY MOUTH TWICE DAILY WITH A MEAL 07/09/24  Yes Newlin, Enobong, MD  cetirizine  (ZYRTEC ) 10 MG tablet Take 1 tablet (10 mg total) by mouth daily. Patient taking differently: Take 10 mg by mouth as needed for allergies. 01/02/24  Yes McClung, Jon HERO, PA-C  diclofenac  Sodium (VOLTAREN ) 1 % GEL APPLY 4 GRAMS EXTERNALLY TO THE AFFECTED AREA FOUR TIMES DAILY Patient taking differently: Apply 4 g topically as needed. APPLY 4 GRAMS EXTERNALLY TO THE AFFECTED AREA FOUR TIMES DAILY 12/13/22  Yes Danton Jon M, PA-C  fluticasone  (FLONASE ) 50 MCG/ACT nasal spray PLACE 1 SPRAY IN BOTH NOSTRILS DAILY Patient taking differently: Place 1 spray into both nostrils as needed for allergies. 06/16/24  Yes Newlin, Enobong, MD  hydrochlorothiazide  (HYDRODIURIL ) 25 MG tablet Take 1 tablet (25 mg total) by mouth daily. 08/01/24  Yes Newlin, Corrina, MD  omeprazole  (PRILOSEC) 20 MG capsule TAKE 1 CAPSULE(20 MG) BY MOUTH DAILY 06/16/24  Yes Newlin, Enobong, MD  traMADol  (ULTRAM ) 50 MG tablet Take 50 mg by mouth every 6 (six) hours as needed for severe pain (pain score 7-10) or moderate pain (pain score 4-6) (tooth pains).   Yes [provider]  dexamethasone  (DECADRON ) 1 MG tablet Take 2 tablets (2 mg total) by mouth every 12 (twelve) hours for 1 day, THEN 2 tablets (2 mg total) daily for 3 days, THEN 1 tablet (1 mg total) daily for 3 days. 08/20/24 08/27/24  Gherghe, Costin M, MD  oxyCODONE -acetaminophen  (PERCOCET) 7.5-325 MG tablet Take 1 tablet by mouth every 4 (four) hours as needed for moderate pain (pain  score 4-6) or severe pain (pain score 7-10). 08/20/24   Trixie Nilda HERO, MD    Current Facility-Administered Medications  Medication Dose Route Frequency Provider Last Rate Last Admin   acetaminophen  (TYLENOL ) tablet 650 mg  650 mg Oral Q6H PRN Garst, Jonathan R, MD   650 mg at 08/15/24 0630   Or   acetaminophen  (TYLENOL ) suppository 650 mg  650 mg Rectal Q6H PRN Darnella Dorn SAUNDERS, MD       albuterol  (PROVENTIL ) (2.5 MG/3ML) 0.083% nebulizer solution 2.5 mg  2.5 mg Nebulization Q6H PRN Darnella Dorn SAUNDERS, MD       amLODipine  (NORVASC ) tablet 10 mg  10 mg Oral Daily Garst, Jonathan R, MD   10 mg at 08/20/24 9096   atorvastatin  (LIPITOR) tablet 20 mg  20 mg Oral Daily Garst, Jonathan R, MD   20 mg at 08/20/24 9096   carvedilol  (COREG ) tablet 6.25 mg  6.25 mg Oral BID WC Garst, Jonathan R, MD   6.25 mg at 08/20/24 9096   Chlorhexidine  Gluconate Cloth 2 % PADS 6 each  6 each Topical Daily Darnella Dorn SAUNDERS, MD   6 each at 08/20/24 1154   cyclobenzaprine (FLEXERIL) tablet 5 mg  5 mg Oral TID Garst, Jonathan R, MD   5 mg at 08/20/24 9096   dexamethasone  (DECADRON ) tablet 2 mg  2 mg Oral Q12H Garst, Jonathan R, MD   2 mg at 08/20/24 9096   Followed by   NOREEN ON 08/21/2024] dexamethasone  (DECADRON ) tablet 2 mg  2 mg Oral Daily Darnella Dorn SAUNDERS, MD       Followed by   NOREEN ON 08/24/2024] dexamethasone  (DECADRON ) tablet 1 mg  1 mg Oral Daily Darnella Dorn SAUNDERS, MD       hydrochlorothiazide  (HYDRODIURIL ) tablet 25 mg  25 mg Oral Daily Garst, Jonathan R, MD   25 mg at 08/20/24 9096   methocarbamol  (ROBAXIN ) tablet 750 mg  750 mg Oral Q6H PRN Garst, Jonathan R, MD   750 mg at 08/20/24 1419   oxyCODONE -acetaminophen  (PERCOCET) 7.5-325 MG per tablet 1 tablet  1 tablet Oral Q4H PRN Darnella Dorn SAUNDERS, MD   1 tablet at 08/20/24 1421   pantoprazole (PROTONIX) injection 40 mg  40 mg Intravenous Q12H Esterwood, Amy S, PA-C       polyethylene glycol (MIRALAX  / GLYCOLAX ) packet 17 g  17 g Oral Daily PRN Garst, Jonathan R, MD   17 g at 08/20/24 9096   sodium chloride  flush (NS) 0.9 % injection 3 mL  3 mL Intravenous Q12H Darnella Dorn SAUNDERS, MD   3 mL at 08/20/24 9095    Allergies as of 08/08/2024   (No Known Allergies)    Family History  Problem Relation Age of Onset   Cancer Mother    Cancer Father        prostate cancer   Diabetes Other    Hypertension Other    Diabetes Maternal Uncle    Anesthesia problems Neg Hx    Hypotension Neg Hx    Malignant hyperthermia Neg Hx    Pseudochol deficiency Neg Hx     Social History   Socioeconomic History   Marital status: Single    Spouse name: Not on file   Number of children: Not on file   Years of education: Not on file   Highest education level: Not on file  Occupational History   Not on file  Tobacco Use   Smoking status: Never   Smokeless tobacco: Never  Substance and Sexual Activity   Alcohol use: No    Alcohol/week: 0.0 standard  drinks of alcohol   Drug use: No   Sexual activity: Yes  Other Topics Concern   Not on file  Social History Narrative   Not on file   Social Drivers of Health   Financial Resource Strain: Low Risk  (05/01/2024)   Overall Financial Resource Strain (CARDIA)    Difficulty of Paying Living Expenses: Not hard at all  Food Insecurity: No Food Insecurity (05/01/2024)   Hunger Vital Sign    Worried About Running Out of Food in the Last Year: Never true    Ran Out of Food in the Last Year: Never true  Transportation Needs: No Transportation Needs (05/01/2024)   PRAPARE - Administrator, Civil Service (Medical): No    Lack of Transportation (Non-Medical): No  Physical Activity: Sufficiently Active (05/01/2024)   Exercise Vital Sign    Days of Exercise per Week: 3 days    Minutes of Exercise per Session: 60 min  Stress: No Stress Concern Present (05/01/2024)   [employer] of Occupational Health - Occupational Stress Questionnaire    Feeling of Stress: Not at all  Social Connections: Moderately Isolated (05/01/2024)   Social Connection and Isolation Panel    Frequency of Communication with Friends and Family: Once a week    Frequency of Social Gatherings with Friends and Family: Twice a week    Attends Religious Services: More than 4 times per year    Active Member of [employer] or Organizations: No    Attends Engineer, structural: Not on file    Marital Status: Never married  Intimate Partner Violence: Not At Risk (03/25/2024)   Humiliation, Afraid, Rape, and Kick questionnaire    Fear of Current or Ex-Partner: No    Emotionally Abused: No    Physically Abused: No    Sexually Abused: No    Review of Systems: Pertinent positive and negative review of systems were noted in the above HPI section.  All other review of systems was otherwise negative.   Physical Exam: Vital signs in last 24 hours: Temp:  [97.6 F (36.4 C)-97.9 F (36.6 C)] 97.9 F (36.6 C) (10/22  1344) Pulse Rate:  [79-97] 83 (10/22 1344) Resp:  [14-16] 16 (10/22 1344) BP: (102-132)/(65-90) 132/73 (10/22 1344) SpO2:  [94 %-98 %] 95 % (10/22 1344) Last BM Date : 08/19/24 General:   Alert,  Well-developed, chronically ill-appearing older African-American male pleasant and cooperative in NAD, sitting up in the chair Head:  Normocephalic and atraumatic. Eyes:  Sclera clear, no icterus.   Conjunctiva pink. Ears:  Normal auditory acuity. Nose:  No deformity, discharge,  or lesions. Mouth:  No deformity or lesions.   Neck:  Supple; no masses or thyromegaly. Lungs:  Clear throughout to auscultation.  Scattered rhonchi.  Heart:  Regular rate and rhythm; stock murmur present Abdomen:  Soft,nontender, BS active,nonpalp mass or hsm.  Foley catheter in place Rectal; not done, Hemoccult pending Msk:  Symmetrical without gross deformities. . Pulses:  Normal pulses noted. Extremities:  Without clubbing or edema. Neurologic:  Alert and  oriented x4;  grossly normal neurologically. Skin:  Intact without significant lesions or rashes.. Psych:  Alert and cooperative. Normal mood and affect.  Intake/Output from previous day: 10/21 0701 - 10/22 0700 In: 420 [P.O.:420] Out: 1200 [Urine:1200] Intake/Output this shift: Total I/O In: -  Out: 850 [Urine:850]  Lab Results: Recent Labs    08/20/24  1420  WBC 17.9*  HGB 14.0  HCT 41.5  PLT 324   BMET Recent Labs    08/19/24 1008  NA 130*  K 3.9  CL 93*  CO2 29  GLUCOSE 170*  BUN 22  CREATININE 0.87  CALCIUM  8.6*   LFT No results for input(s): PROT, ALBUMIN, AST, ALT, ALKPHOS, BILITOT, BILIDIR, IBILI in the last 72 hours. PT/INR No results for input(s): LABPROT, INR in the last 72 hours. Hepatitis Panel No results for input(s): HEPBSAG, HCVAB, HEPAIGM, HEPBIGM in the last 72 hours.    IMPRESSION:  #69 68 year old African-American male with 1 episode of grossly melenic appearing stool  today Hemoglobin 14 today, Hemoccult pending Has not been on anticoagulation during this admission  He had been on high-dose steroids increasing risk for gastritis, or ulcer.  #2 status postresection of T1/2 spinal mass lesion on 08/14/2024-there was concern that this was a malignant lesion but fortunately path showed this to be a hematoma which occurred in the setting of chronic Eliquis . He had presented with concerns for rapidly progressive thoracic myelopathy  #3 history of atrial fibrillation for which she has been on chronic Eliquis  #4.  Hypertension #5.  Prior history of DVT #6.  History of hepatitis C with compensated cirrhosis per notes-treated 2017-last ultrasound 2019 showed normal hepatic echotexture. 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 past midnight Hemoglobin every 8 hours, and transfuse as indicated Continue to hold Eliquis  And IV PPI twice daily Patient will be scheduled for EGD with Dr. Legrand for tomorrow 08/21/2024.  Procedure has been discussed in detail with the patient including indications risks and benefits and he is agreeable to proceed.  He is disappointed that he is having another issue that is delaying his transfer to rehab but understands.  Patient should have MRI of his spleen for further evaluation of the splenic lesion noted on CT during this admission. GI will follow with you   Amy Esterwood PA-C 08/20/2024, 4:35 PM  I have taken an interval history, thoroughly reviewed the chart and examined the patient. I agree with the Advanced Practitioner's note, impression and recommendations, and have recorded additional findings, impressions and recommendations below. I performed a substantive portion of this encounter (>50% time spent),  including a complete performance of the medical decision making.  My additional thoughts are as follows:  Reported melena today, hemodynamically stable, which is hemoglobin 14 and actually increased from 5 days prior. Has been off his Eliquis  for over a week.  No localizing symptoms for location of GI blood loss.  He had some lower abdominal pressure and bloating last evening but that was relieved after Foley catheter placed.  He is planned for an upper endoscopy tomorrow and was agreeable after discussion of the procedure and risks.  His wife present at the bedside during my visit.  If no source on EGD, he will need a colonoscopy following day. _________________  This consultation required a high degree of medical decision making due to the nature and complexity of the acute condition(s) being evaluated as well as the patient's medical comorbidities.  Endoscopic procedure planned.  Victory LITTIE Legrand III [phone]

## 2024-08-20 NOTE — TOC Transition Note (Addendum)
 Transition of Care [facility]) - Discharge Note   Patient Details  Name: Dustin [name] MRN: [id number] Date of Birth: 09/12/1956  [facility] CM/SW Contact:  Montie LOISE Louder, LCSW Phone Number: 08/20/2024, 12:49 PM  Update-PTAR cancelled - per MD patient is not stable   Clinical Narrative:     Patient will Discharge to: [facility] Discharge Date:08/20/2024 Family Notified: fiance Transport Ab:[facility]  Per MD patient is ready for discharge. RN, patient, and facility notified of discharge. Discharge Summary sent to facility. RN given number for [phone]. Ambulance transport requested for patient.   Clinical Social Worker signing off.  Montie Louder, MSW, LCSW Clinical Social Worker     Final next level of care: Skilled Nursing Facility Barriers to Discharge: Barriers Resolved   Patient Goals and CMS Choice            Discharge Placement              Patient chooses bed at: [name] Patient to be transferred to facility by: [facility] Name of family member notified: fiance Patient and family notified of of transfer: 08/20/24  Discharge Plan and Services Additional resources added to the After Visit Summary for                                       Social Drivers of Health (SDOH) Interventions SDOH Screenings   Food Insecurity: No Food Insecurity (05/01/2024)  Housing: Unknown (05/01/2024)  Transportation Needs: No Transportation Needs (05/01/2024)  Utilities: Not At Risk (03/25/2024)  Alcohol Screen: Low Risk  (05/01/2024)  Depression (PHQ2-9): Low Risk  (08/05/2024)  Financial Resource Strain: Low Risk  (05/01/2024)  Physical Activity: Sufficiently Active (05/01/2024)  Social Connections: Moderately Isolated (05/01/2024)  Stress: No Stress Concern Present (05/01/2024)  Tobacco Use: Low Risk  (08/14/2024)  Health Literacy: Adequate Health Literacy (03/25/2024)     Readmission Risk Interventions     No data to display

## 2024-08-21 ENCOUNTER — Inpatient Hospital Stay (HOSPITAL_COMMUNITY): Admitting: Anesthesiology

## 2024-08-21 ENCOUNTER — Encounter (HOSPITAL_COMMUNITY)
Admission: EM | Disposition: A | Discharge status: Skilled Nursing Facility | Payer: Self-pay | Source: Home / Self Care | Attending: Internal Medicine

## 2024-08-21 DIAGNOSIS — K921 Melena: Secondary | ICD-10-CM

## 2024-08-21 DIAGNOSIS — D497 Neoplasm of unspecified behavior of endocrine glands and other parts of nervous system: Secondary | ICD-10-CM | POA: Diagnosis not present

## 2024-08-21 DIAGNOSIS — I10 Essential (primary) hypertension: Secondary | ICD-10-CM

## 2024-08-21 DIAGNOSIS — K3189 Other diseases of stomach and duodenum: Secondary | ICD-10-CM

## 2024-08-21 DIAGNOSIS — I4891 Unspecified atrial fibrillation: Secondary | ICD-10-CM

## 2024-08-21 DIAGNOSIS — K317 Polyp of stomach and duodenum: Secondary | ICD-10-CM

## 2024-08-21 HISTORY — PX: ESOPHAGOGASTRODUODENOSCOPY: SHX5428

## 2024-08-21 LAB — HEMOGLOBIN AND HEMATOCRIT, BLOOD
HCT: 39.1 % (ref 39.0–52.0)
Hemoglobin: 13.4 g/dL (ref 13.0–17.0)

## 2024-08-21 LAB — BASIC METABOLIC PANEL WITH GFR
Anion gap: 10 (ref 5–15)
BUN: 22 mg/dL (ref 8–23)
CO2: 28 mmol/L (ref 22–32)
Calcium: 8.5 mg/dL — ABNORMAL LOW (ref 8.9–10.3)
Chloride: 91 mmol/L — ABNORMAL LOW (ref 98–111)
Creatinine, Ser: 0.82 mg/dL (ref 0.61–1.24)
GFR, Estimated: >60 mL/min (ref >60)
Glucose, Bld: 144 mg/dL — ABNORMAL HIGH (ref 70–99)
Potassium: 4.3 mmol/L (ref 3.5–5.1)
Sodium: 129 mmol/L — ABNORMAL LOW (ref 135–145)

## 2024-08-21 SURGERY — EGD (ESOPHAGOGASTRODUODENOSCOPY)
Anesthesia: Monitor Anesthesia Care

## 2024-08-21 MED ORDER — BISACODYL 5 MG PO TBEC
10.0000 mg | DELAYED_RELEASE_TABLET | Freq: Once | ORAL | Status: AC
  Administered 2024-08-21: 10 mg via ORAL
  Filled 2024-08-21: qty 2

## 2024-08-21 MED ORDER — ORAL CARE MOUTH RINSE: 15.0000 mL | OROMUCOSAL | Status: DC | PRN

## 2024-08-21 MED ORDER — LIDOCAINE 2% (20 MG/ML) 5 ML SYRINGE
INTRAMUSCULAR | Status: DC | PRN
  Administered 2024-08-21: 40 mg via INTRAVENOUS

## 2024-08-21 MED ORDER — SODIUM CHLORIDE 0.9 % IV SOLN: INTRAVENOUS | Status: AC

## 2024-08-21 MED ORDER — PEG 3350-KCL-NA BICARB-NACL 420 G PO SOLR
4000.0000 mL | Freq: Once | ORAL | Status: AC
  Administered 2024-08-21: 4000 mL via ORAL
  Filled 2024-08-21: qty 4000

## 2024-08-21 MED ORDER — PROPOFOL 500 MG/50ML IV EMUL
INTRAVENOUS | Status: DC | PRN
  Administered 2024-08-21: 150 ug/kg/min via INTRAVENOUS
  Administered 2024-08-21: 20 mg via INTRAVENOUS
  Administered 2024-08-21: 40 mg via INTRAVENOUS

## 2024-08-21 MED ORDER — SODIUM CHLORIDE 0.9 % IV SOLN: INTRAVENOUS | Status: DC | PRN

## 2024-08-21 MED ORDER — SIMETHICONE 80 MG PO CHEW
240.0000 mg | CHEWABLE_TABLET | Freq: Once | ORAL | Status: AC
  Administered 2024-08-21: 240 mg via ORAL
  Filled 2024-08-21: qty 3

## 2024-08-21 MED ORDER — BOOST / RESOURCE BREEZE PO LIQD CUSTOM
1.0000 | Freq: Three times a day (TID) | ORAL | Status: DC
Start: 2024-08-21 — End: 2024-08-24
  Administered 2024-08-21 – 2024-08-24 (×8): 1 via ORAL

## 2024-08-21 NOTE — Plan of Care (Signed)
   Problem: Activity: Goal: Risk for activity intolerance will decrease Outcome: Progressing

## 2024-08-21 NOTE — Progress Notes (Signed)
 PROGRESS NOTE  Dustin [name] FMW:[id number] DOB: 09-01-1956 DOA: 08/08/2024 PCP: Delbert Clam, MD   LOS: 13 days   Brief Narrative / Interim history: 68 y.o. M with HTN, compensated hep C cirrhosis, hx VTE on Eliquis , and hx NET in 2017 who presented with paresthesias, found to have thoracic spine lesion.  He underwent surgery on 10/16   Subjective / 24h Interval events: Doing well, no headaches, no lightheadedness or dizziness.  Assesement and Plan: Principal problem T2 intramedullary spinal lesion - Status post intramedullary mass resection and laminectomy on 10/16 by Dr. Darnella.  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  Has been placed on dexamethasone  taper, continue as per neurosurgery.  He will need rehab   Active problems Melena -patient with new onset melena 10/22, also bright red blood.  GI consulted, underwent an EGD today, no clear source of bleeding.  Will undergo colonoscopy tomorrow.  Hemoglobin is stable  Persistent atrial fibrillation - Patient was evaluated in the ER for vomiting in July 2025, found to have new onset atrial flutter, started on Eliquis .  Seen in the atrial fibrillation clinic, and was in persistent A-fib. 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 Given his CHA2DS2-VASc score of only 2, I recommend consideration of NOT resuming anticoagulation at that time, or consideration of a watchman.  Urinary retention - Due to spinal lesion.  Continue Foley on discharge, failed voiding trial 10/21.  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   Hyponatremia - Mild, asymptomatic  History of DVT - Patient had a nonocclusive brachial vein DVT after hand surgery in 2012.  No clots since then.  Treated with anticoagulation at that time, then stopped.  Recently anticoagulation resumed in setting of A-fib, see #2 above.  Scheduled Meds:  [MAR Hold] amLODipine   10 mg Oral Daily   [MAR Hold] atorvastatin   20 mg Oral Daily   [MAR Hold] carvedilol   6.25 mg Oral BID WC   [MAR Hold] Chlorhexidine  Gluconate Cloth  6 each Topical Daily   [MAR Hold] cyclobenzaprine  5 mg Oral TID   [MAR Hold] dexamethasone   2 mg Oral Daily   Followed by   [FJM Hold] dexamethasone   1 mg Oral Daily   [MAR Hold] hydrochlorothiazide   25 mg Oral Daily   [MAR Hold] pantoprazole (PROTONIX) IV  40 mg Intravenous Q12H   [MAR Hold] sodium chloride  flush  3 mL Intravenous Q12H   Continuous Infusions: PRN Meds:.[MAR Hold] acetaminophen  **OR** [MAR Hold] acetaminophen , [MAR Hold] albuterol , [MAR Hold] methocarbamol , [MAR Hold] oxyCODONE -acetaminophen , [MAR Hold] polyethylene glycol  Current Outpatient Medications  Medication Instructions   acetaminophen  (TYLENOL ) 500 mg, Oral, Every 6 hours PRN   acetaminophen -codeine  (TYLENOL  #3) 300-30 MG tablet 1 tablet, Oral, Every 6 hours PRN   albuterol  (VENTOLIN  HFA) 108 (90 Base) MCG/ACT inhaler 2 puffs, Inhalation, Every 6 hours PRN   amLODipine  (NORVASC ) 10 mg, Oral, Daily   apixaban  (ELIQUIS ) 5 mg, Oral, 2 times daily   atorvastatin  (LIPITOR) 20 MG tablet TAKE  1 TABLET(20 MG) BY MOUTH DAILY   carvedilol  (COREG ) 6.25 MG tablet TAKE 1 TABLET(6.25 MG) BY MOUTH TWICE DAILY WITH A MEAL   cetirizine  (ZYRTEC ) 10 mg, Oral, Daily   dexamethasone  (DECADRON ) 1 MG tablet Take 2 tablets (2 mg total) by mouth every 12 (twelve) hours for 1 day, THEN 2 tablets (2 mg total) daily for 3 days, THEN 1 tablet (1 mg total)  daily for 3 days.   diclofenac  Sodium (VOLTAREN ) 1 % GEL APPLY 4 GRAMS EXTERNALLY TO THE AFFECTED AREA FOUR TIMES DAILY   fluticasone  (FLONASE ) 50 MCG/ACT nasal spray PLACE 1 SPRAY IN BOTH NOSTRILS DAILY   hydrochlorothiazide  (HYDRODIURIL ) 25 mg, Oral, Daily   omeprazole  (PRILOSEC) 20 MG capsule TAKE 1 CAPSULE(20 MG) BY MOUTH DAILY   oxyCODONE -acetaminophen  (PERCOCET) 7.5-325 MG tablet 1 tablet, Oral, Every 4 hours PRN   traMADol  (ULTRAM ) 50 mg, Oral, Every 6 hours PRN    Diet Orders (From admission, onward)     Start     Ordered   08/21/24 0001  Diet NPO time specified Except for: Sips with Meds  Diet effective midnight       Question:  Except for  Answer:  Sips with Meds   08/20/24 1807            DVT prophylaxis: Place and maintain sequential compression device Start: 08/18/24 1209   Lab Results  Component Value Date   PLT 324 08/20/2024      Code Status: Full Code  Family Communication: No family at bedside  Status is: Inpatient Remains inpatient appropriate because: Colonoscopy tomorrow   Level of care: Med-Surg  Consultants:  GI Neurosurgery  Oncology   Objective: Vitals:   08/21/24 0714 08/21/24 0845 08/21/24 0934 08/21/24 0940  BP: 103/71 122/89 94/66 100/62  Pulse: 84 88 84 85  Resp: 16 (!) 22 20 20   Temp: 98.3 F (36.8 C)  (!) 97 F (36.1 C)   TempSrc: Oral  Temporal   SpO2: 100% 98% 100% 98%  Weight:      Height:        Intake/Output Summary (Last 24 hours) at 08/21/2024 0945 Last data filed at 08/21/2024 9063 Gross per 24 hour  Intake 250 ml  Output 2025 ml  Net -1775 ml   Wt Readings from Last 3 Encounters:  08/14/24 95.3 kg  08/05/24 95.2 kg  06/08/24 97.1 kg    Examination:  Constitutional: NAD Eyes: no scleral icterus ENMT: Mucous membranes are moist.  Neck: normal, supple Respiratory: clear to auscultation bilaterally, no wheezing, no crackles. Cardiovascular: Regular rate and rhythm, no murmurs / rubs /  gallops. Abdomen: non distended, no tenderness. Bowel sounds positive.  Musculoskeletal: no clubbing / cyanosis.   Data Reviewed: I have independently reviewed following labs and imaging studies   CBC Recent Labs  Lab 08/14/24 1652 08/15/24 0519 08/20/24 1420 08/20/24 1748 08/21/24 0003  WBC  --  10.2 17.9*  --   --   HGB 12.9* 12.7* 14.0 13.7 13.4  HCT 38.0* 37.2* 41.5 40.2 39.1  PLT  --  289 324  --   --   MCV  --  87.7 88.9  --   --   MCH  --  30.0 30.0  --   --   MCHC  --  34.1 33.7  --   --   RDW  --  13.2 13.8  --   --     Recent Labs  Lab 08/14/24 1652 08/15/24 0519 08/19/24 1008 08/21/24  0124  NA 131* 131* 130* 129*  K 4.1 4.2 3.9 4.3  CL  --  94* 93* 91*  CO2  --  27 29 28   GLUCOSE  --  155* 170* 144*  BUN  --  23 22 22   CREATININE  --  0.86 0.87 0.82  CALCIUM   --  8.7* 8.6* 8.5*    ------------------------------------------------------------------------------------------------------------------ No results for input(s): CHOL, HDL, LDLCALC, TRIG, CHOLHDL, LDLDIRECT in the last 72 hours.  Lab Results  Component Value Date   HGBA1C 6.0 05/05/2024   ------------------------------------------------------------------------------------------------------------------ No results for input(s): TSH, T4TOTAL, T3FREE, THYROIDAB in the last 72 hours.  Invalid input(s): FREET3  Cardiac Enzymes No results for input(s): CKMB, TROPONINI, MYOGLOBIN in the last 168 hours.  Invalid input(s): CK ------------------------------------------------------------------------------------------------------------------ No results found for: BNP  CBG: No results for input(s): GLUCAP in the last 168 hours.  Recent Results (from the past 240 hours)  Surgical pcr screen     Status: Abnormal   Collection Time: 08/14/24  1:29 AM   Specimen: Nasal Mucosa; Nasal Swab  Result Value Ref Range Status   MRSA, PCR NEGATIVE NEGATIVE Final    Staphylococcus aureus POSITIVE (A) NEGATIVE Final    Comment: (NOTE) The Xpert SA Assay (FDA approved for NASAL specimens in patients 56 years of age and older), is one component of a comprehensive surveillance program. It is not intended to diagnose infection nor to guide or monitor treatment. Performed at [facility], [address] [place], [place] [address]      Radiology Studies: No results found.   Nilda Fendt, MD, PhD [facility]  Between 7 am - 7 pm I am available, please contact me via Amion (for emergencies) or Securechat (non urgent messages)  Between 7 pm - 7 am I am not available, please contact night coverage MD/APP via Amion

## 2024-08-21 NOTE — Anesthesia Preprocedure Evaluation (Signed)
 Anesthesia Evaluation  Patient identified by MRN, date of birth, ID band Patient awake    Reviewed: Allergy & Precautions, NPO status , Patient's Chart, lab work & pertinent test results, reviewed documented beta blocker date and time   Airway Mallampati: II  TM Distance: >3 FB     Dental  (+) Missing, Poor Dentition, Dental Advisory Given   Pulmonary shortness of breath and with exertion   Pulmonary exam normal breath sounds clear to auscultation       Cardiovascular hypertension, Pt. on medications + dysrhythmias Atrial Fibrillation  Rhythm:Irregular Rate:Normal  Echo 05/16/24  1. Systolic anterior motion of the mitral valve, severe LVH, mild MR,  Peak LVOT gradient while in Afib (Evalute for HCM, cardiac  amyloidosis (apical sparing noted on strain imaging), or other  infiltrative cardiomyopathy). . Left ventricular  ejection fraction, by estimation, is >75%. The left ventricle has  hyperdynamic function. The left ventricle has no regional wall motion  abnormalities. There is severe left ventricular hypertrophy. Left  ventricular diastolic function could not be  evaluated. The average left ventricular global longitudinal strain is  -10.2 %. The global longitudinal strain is abnormal.   2. Right ventricular systolic function is normal. The right ventricular  size is normal. There is normal pulmonary artery systolic pressure.   3. Left atrial size was mildly dilated.   4. The mitral valve is grossly normal. Mild mitral valve regurgitation.  No evidence of mitral stenosis.   5. The aortic valve is normal in structure. Aortic valve regurgitation is  not visualized. No aortic stenosis is present.   EKG Atrial fibrillation Left ventricular hypertrophy with repolarization abnormality ( Sokolow-Lyon ) Anteroseptal infarct , age undetermined   Neuro/Psych negative neurological ROS  negative psych ROS   GI/Hepatic ,,,(+)  Hepatitis -GI Bleed Hx/o carcinoid tumor   Endo/Other  Eliquis  therapy- last dose over a week ago  Renal/GU negative Renal ROS  negative genitourinary   Musculoskeletal  (+) Arthritis , Osteoarthritis,    Abdominal   Peds  Hematology  (+) Blood dyscrasia, anemia   Anesthesia Other Findings   Reproductive/Obstetrics                              Anesthesia Physical Anesthesia Plan  ASA: 3  Anesthesia Plan: MAC   Post-op Pain Management: Minimal or no pain anticipated   Induction: Intravenous  PONV Risk Score and Plan: 2 and Treatment may vary due to age or medical condition and Propofol  infusion  Airway Management Planned: Natural Airway, Nasal Cannula and Simple Face Mask  Additional Equipment:   Intra-op Plan:   Post-operative Plan:   Informed Consent: I have reviewed the patients History and Physical, chart, labs and discussed the procedure including the risks, benefits and alternatives for the proposed anesthesia with the patient or authorized representative who has indicated his/her understanding and acceptance.     Dental advisory given  Plan Discussed with: CRNA and Anesthesiologist  Anesthesia Plan Comments:          Anesthesia Quick Evaluation

## 2024-08-21 NOTE — Op Note (Signed)
 [facility] Patient Name: Dustin [name] Procedure Date : 08/21/2024 MRN: [id number] Attending MD: Victory CROME. Legrand , MD, [id number] Date of Birth: July 13, 1956 CSN: [id number] Age: 68 Admit Type: Inpatient Procedure:                Upper GI endoscopy Indications:              Single episode yesterday of Melena/maroon blood                            (nursing description) - stool specimen heme                            positive. Hemoglobin remains normal Providers:                Victory L. Legrand, MD, Willy Hummer, RN, Fairy Marina, Technician Referring MD:             [facility] Medicines:                Monitored Anesthesia Care Complications:            No immediate complications. Estimated Blood Loss:     Estimated blood loss was minimal. Procedure:                Pre-Anesthesia Assessment:                           - Prior to the procedure, a History and Physical                            was performed, and patient medications and                            allergies were reviewed. The patient's tolerance of                            previous anesthesia was also reviewed. The risks                            and benefits of the procedure and the sedation                            options and risks were discussed with the patient.                            All questions were answered, and informed consent                            was obtained. Prior Anticoagulants: The patient has                            taken no anticoagulant or antiplatelet agents. ASA  Grade Assessment: III - A patient with severe                            systemic disease. After reviewing the risks and                            benefits, the patient was deemed in satisfactory                            condition to undergo the procedure.                           After obtaining informed consent, the endoscope was                             passed under direct vision. Throughout the                            procedure, the patient's blood pressure, pulse, and                            oxygen saturations were monitored continuously. The                            [id number]-[id number] ([id number]) Olympus endoscope was introduced                            through the mouth, and advanced to the second part                            of duodenum. The upper GI endoscopy was                            accomplished without difficulty. The patient                            tolerated the procedure well. Scope In: Scope Out: Findings:      The esophagus was normal.      A scar was found on the greater curvature of the gastric body. The scar       tissue was healthy in appearance. (most likely site of prior gastric       mucosal carcinoid removal)      Several ssessile and semi-pedunculated polyps with no bleeding and no       stigmata of recent bleeding were found in the gastric antrum and in the       prepyloric region of the stomach. Ranging from 3-6 mm in size. Large       ones closer to pylorus and with diminutive surface erosions. Biopsies       were taken from one of the prepyloric lesions with a cold forceps for       histology.      The exam of the stomach was otherwise normal, including on retroflexion.       No fresh or old blood in the upper GI tract. Stomach distended well with  insufflation.      The examined duodenum was normal.      The prepyloric polyps appear most likely to be hyperplastic or from       peristaltic prolapse. There are diminutive surface erosions, but the       these polyps do not have the appearance of having caused overt GI       bleeding even if they could account for Heme positive stool. Impression:               - Normal esophagus.                           - Scar in the gastric body (greater curvature).                           - A few gastric polyps. Biopsied.                            - Normal examined duodenum. Recommendation:           - Clear liquid diet today.                           - Return patient to hospital ward for ongoing care.                           - Perform a colonoscopy tomorrow to evaluate for                            lower GI sources of bleeding.                           Results conveyed to patient. Fiance phone number                            not in chart Procedure Code(s):        --- Professional ---                           [phone], Esophagogastroduodenoscopy, flexible,                            transoral; with biopsy, single or multiple Diagnosis Code(s):        --- Professional ---                           K31.89, Other diseases of stomach and duodenum                           K31.7, Polyp of stomach and duodenum                           K92.1, Melena (includes Hematochezia) CPT copyright 2022 American Medical Association. All rights reserved. The codes documented in this report are preliminary and upon coder review may  be revised to meet current compliance requirements. Maanasa Aderhold L. Legrand, MD 08/21/2024 9:43:40 AM This report has been signed electronically. Number of Addenda: 0

## 2024-08-21 NOTE — Progress Notes (Signed)
 Occupational Therapy Treatment Patient Details Name: Dustin [name] MRN: [id number] DOB: Aug 03, 1956 Today's Date: 08/21/2024   History of present illness 68 yo male presents to [facility] on 10/10 with progressive LE weakness, inability to ambulate, and bowel/bladder changes. 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 CT abd/pelvis shows splenic lesion. fall 10/11, assisted to floor by staff and no injuries sustained. 10/16 S/P resection of intramedullary mass, T1-T3 laminectomy 10/16.  PMH includes HTN, HLD, hep C, liver cirrhosis, prior DVT, GI carcinoid tumor, remote history of ETOH use.   OT comments  Pt greeted in supine, agreeable to participate with OT. Pt transferred to EOB with min A for LLE mgmt, cues for technique. He tolerated sitting EOB for majority of session with intermittent CGA for safety. Session focused on trunk control during functional and preparatory tasks. Needed CGA for simple grooming while sitting unsupported. Engaged in modified sit-ups without UE support, needing CGA-min A to correct posture as he fatigued. Overall, tolerated session well, pt thankful for a distraction from current medical status. AMPAC 15/24, indicating improving functional performance. OT to continue to follow.      If plan is discharge home, recommend the following:  Two people to help with walking and/or transfers;Assistance with cooking/housework;Assist for transportation;Help with stairs or ramp for entrance;A lot of help with bathing/dressing/bathroom   Equipment Recommendations  Other (comment) (defer)    Recommendations for Other Services      Precautions / Restrictions Precautions Precautions: Fall;Back Precaution Booklet Issued: Yes (comment) Recall of Precautions/Restrictions: Impaired Precaution/Restrictions Comments: reviewed with pt Required Braces or Orthoses:  (no  brace needed per Dr. Darnella) Restrictions Weight Bearing Restrictions Per Provider Order: No       Mobility Bed Mobility Overal bed mobility: Needs Assistance Bed Mobility: Supine to Sit, Sit to Supine     Supine to sit: Min assist, HOB elevated, Used rails Sit to supine: Mod assist, HOB elevated, Used rails   General bed mobility comments: Inc effort for BLE mgmt sit>supine, cues for log roll technique    Transfers                   General transfer comment: session focused on EOB sitting balance     Balance Overall balance assessment: Needs assistance Sitting-balance support: No upper extremity supported, Feet supported, Single extremity supported Sitting balance-Leahy Scale: Fair Sitting balance - Comments: CGA for safety and to prevent posterior LOB while engaged in ADLs at EOB                                   ADL either performed or assessed with clinical judgement   ADL Overall ADL's : Needs assistance/impaired     Grooming: Oral care;Wash/dry face;Sitting Grooming Details (indicate cue type and reason): sitting unsupported encouraged, intermittent rest breaks due to back fatigue & reduced trunk control             Lower Body Dressing: Total assistance;Bed level Lower Body Dressing Details (indicate cue type and reason): adjusting B socks                    Extremity/Trunk Assessment              Vision       Perception     Praxis  Communication Communication Communication: No apparent difficulties   Cognition Arousal: Alert Behavior During Therapy: WFL for tasks assessed/performed Cognition: No apparent impairments                               Following commands: Intact        Cueing   Cueing Techniques: Gestural cues, Verbal cues, Tactile cues  Exercises Other Exercises Other Exercises: weightshifting leaning onto forearms in b/l directions 10x, using forearms for pushing back to neutral  position Other Exercises: modified sit-ups with pt focus on anterior/posterior pelvic tilt with B hands placed on lap - in preparation for transfers and for trunk control    Shoulder Instructions       General Comments post-op spinal incision appeared C/D/I    Pertinent Vitals/ Pain       Pain Assessment Pain Assessment: 0-10 Pain Score: 7  Pain Location: back Pain Descriptors / Indicators: Discomfort Pain Intervention(s): Limited activity within patient's tolerance, Monitored during session, Repositioned  Home Living                                          Prior Functioning/Environment              Frequency  Min 2X/week        Progress Toward Goals  OT Goals(current goals can now be found in the care plan section)  Progress towards OT goals: Progressing toward goals     Plan      Co-evaluation                 AM-PAC OT 6 Clicks Daily Activity     Outcome Measure   Help from another person eating meals?: None Help from another person taking care of personal grooming?: A Little Help from another person toileting, which includes using toliet, bedpan, or urinal?: Total Help from another person bathing (including washing, rinsing, drying)?: A Lot Help from another person to put on and taking off regular upper body clothing?: A Little Help from another person to put on and taking off regular lower body clothing?: A Lot 6 Click Score: 15    End of Session    OT Visit Diagnosis: Unsteadiness on feet (R26.81);Muscle weakness (generalized) (M62.81)   Activity Tolerance Patient tolerated treatment well   Patient Left     Nurse Communication Mobility status        Time: 8468-8395 OT Time Calculation (min): 33 min  Charges: OT General Charges $OT Visit: 1 Visit OT Treatments $Self Care/Home Management : 8-22 mins $Therapeutic Activity: 8-22 mins  Brieonna Crutcher D., MSOT, OTR/L Acute Rehabilitation Services [phone] Secure Chat Preferred  [name] 08/21/2024, 5:06 PM

## 2024-08-21 NOTE — Transfer of Care (Signed)
 Immediate Anesthesia Transfer of Care Note  Patient: Dustin [name]  Procedure(s) Performed: EGD (ESOPHAGOGASTRODUODENOSCOPY)  Patient Location: PACU and Endoscopy Unit  Anesthesia Type:MAC  Level of Consciousness: drowsy  Airway & Oxygen Therapy: Patient Spontanous Breathing and Patient connected to nasal cannula oxygen  Post-op Assessment: Report given to RN and Post -op Vital signs reviewed and stable  Post vital signs: Reviewed and stable  Last Vitals:  Vitals Value Taken Time  BP 94/66   Temp    Pulse 85   Resp 20   SpO2 100     Last Pain:  Vitals:   08/21/24 0845  TempSrc:   PainSc: 0-No pain      Patients Stated Pain Goal: 3 (08/14/24 1836)  Complications: No notable events documented.

## 2024-08-21 NOTE — Interval H&P Note (Signed)
 History and Physical Interval Note:  08/21/2024 8:51 AM  Dustin [name]  has presented today for surgery, with the diagnosis of Melena.  The various methods of treatment have been discussed with the patient and family. After consideration of risks, benefits and other options for treatment, the patient has consented to  Procedure(s): EGD (ESOPHAGOGASTRODUODENOSCOPY) (N/A) as a surgical intervention.  The patient's history has been reviewed, patient examined, no change in status, stable for surgery.  I have reviewed the patient's chart and labs.  Questions were answered to the patient's satisfaction.    Hemoglobin remains normal today and patient is clinically stable.  Dustin [name]

## 2024-08-21 NOTE — Anesthesia Postprocedure Evaluation (Signed)
 Anesthesia Post Note  Patient: Dustin [name]  Procedure(s) Performed: EGD (ESOPHAGOGASTRODUODENOSCOPY)     Patient location during evaluation: PACU Anesthesia Type: MAC Level of consciousness: awake and alert and oriented Pain management: pain level controlled Vital Signs Assessment: post-procedure vital signs reviewed and stable Respiratory status: spontaneous breathing, nonlabored ventilation and respiratory function stable Cardiovascular status: stable and blood pressure returned to baseline Postop Assessment: no apparent nausea or vomiting Anesthetic complications: no   No notable events documented.  Last Vitals:  Vitals:   08/21/24 0940 08/21/24 0950  BP: 100/62 95/65  Pulse: 85 93  Resp: 20 13  Temp:    SpO2: 98% 99%    Last Pain:  Vitals:   08/21/24 0950  TempSrc:   PainSc: 0-No pain                 Chen Saadeh A.

## 2024-08-21 NOTE — TOC Progression Note (Signed)
 Transition of Care [facility]) - Progression Note    Patient Details  Name: Dustin [name] MRN: [id number] Date of Birth: 06-29-1956  Transition of Care [facility]) CM/SW Contact  Almarie CHRISTELLA [place], [place] Phone Number: 08/21/2024, 11:43 AM  Clinical Narrative:   CSW following for discharge to SNF once stable. Medical workup ongoing. Patient's insurance authorization runs out today, will need new authorization once stable. Brief update provided to [facility], they are continuing to follow patient. CSW to follow.    Expected Discharge Plan: IP Rehab Facility Barriers to Discharge: Continued Medical Work up, English as a second language teacher               Expected Discharge Plan and Services         Expected Discharge Date: 08/20/24                                     Social Drivers of Health (SDOH) Interventions SDOH Screenings   Food Insecurity: No Food Insecurity (05/01/2024)  Housing: Unknown (05/01/2024)  Transportation Needs: No Transportation Needs (05/01/2024)  Utilities: Not At Risk (03/25/2024)  Alcohol Screen: Low Risk  (05/01/2024)  Depression (PHQ2-9): Low Risk  (08/05/2024)  Financial Resource Strain: Low Risk  (05/01/2024)  Physical Activity: Sufficiently Active (05/01/2024)  Social Connections: Moderately Isolated (05/01/2024)  Stress: No Stress Concern Present (05/01/2024)  Tobacco Use: Low Risk  (08/14/2024)  Health Literacy: Adequate Health Literacy (03/25/2024)    Readmission Risk Interventions     No data to display

## 2024-08-21 NOTE — Progress Notes (Signed)
 PT Cancellation Note  Patient Details Name: ZIMERE DUNLEVY MRN: [id number] DOB: [birth year]   Cancelled Treatment:    Reason Eval/Treat Not Completed: Patient at procedure or test/unavailable. Will plan to follow-up as time permits.   Theo Ferretti, PT, DPT Acute Rehabilitation Services  Office: [phone]    Theo CHRISTELLA Ferretti 08/21/2024, 9:05 AM

## 2024-08-22 ENCOUNTER — Encounter (HOSPITAL_COMMUNITY)
Admission: EM | Disposition: A | Discharge status: Skilled Nursing Facility | Payer: Self-pay | Source: Home / Self Care | Attending: Internal Medicine

## 2024-08-22 ENCOUNTER — Inpatient Hospital Stay (HOSPITAL_COMMUNITY): Admitting: Anesthesiology

## 2024-08-22 DIAGNOSIS — D123 Benign neoplasm of transverse colon: Secondary | ICD-10-CM

## 2024-08-22 DIAGNOSIS — D120 Benign neoplasm of cecum: Secondary | ICD-10-CM

## 2024-08-22 DIAGNOSIS — D122 Benign neoplasm of ascending colon: Secondary | ICD-10-CM

## 2024-08-22 DIAGNOSIS — K6289 Other specified diseases of anus and rectum: Secondary | ICD-10-CM

## 2024-08-22 DIAGNOSIS — K626 Ulcer of anus and rectum: Secondary | ICD-10-CM

## 2024-08-22 DIAGNOSIS — K5730 Diverticulosis of large intestine without perforation or abscess without bleeding: Secondary | ICD-10-CM

## 2024-08-22 DIAGNOSIS — K920 Hematemesis: Secondary | ICD-10-CM

## 2024-08-22 DIAGNOSIS — K635 Polyp of colon: Secondary | ICD-10-CM

## 2024-08-22 DIAGNOSIS — D497 Neoplasm of unspecified behavior of endocrine glands and other parts of nervous system: Secondary | ICD-10-CM | POA: Diagnosis not present

## 2024-08-22 DIAGNOSIS — I10 Essential (primary) hypertension: Secondary | ICD-10-CM

## 2024-08-22 HISTORY — PX: COLONOSCOPY: SHX5424

## 2024-08-22 LAB — COMPREHENSIVE METABOLIC PANEL WITH GFR
ALT: 25 U/L (ref 0–44)
AST: 35 U/L (ref 15–41)
Albumin: 3.1 g/dL — ABNORMAL LOW (ref 3.5–5.0)
Alkaline Phosphatase: 39 U/L (ref 38–126)
Anion gap: 11 (ref 5–15)
BUN: 19 mg/dL (ref 8–23)
CO2: 25 mmol/L (ref 22–32)
Calcium: 8.4 mg/dL — ABNORMAL LOW (ref 8.9–10.3)
Chloride: 91 mmol/L — ABNORMAL LOW (ref 98–111)
Creatinine, Ser: 0.83 mg/dL (ref 0.61–1.24)
GFR, Estimated: >60 mL/min (ref >60)
Glucose, Bld: 121 mg/dL — ABNORMAL HIGH (ref 70–99)
Potassium: 4.1 mmol/L (ref 3.5–5.1)
Sodium: 127 mmol/L — ABNORMAL LOW (ref 135–145)
Total Bilirubin: 2 mg/dL — ABNORMAL HIGH (ref 0.0–1.2)
Total Protein: 6 g/dL — ABNORMAL LOW (ref 6.5–8.1)

## 2024-08-22 LAB — CBC
HCT: 39.5 % (ref 39.0–52.0)
Hemoglobin: 13.6 g/dL (ref 13.0–17.0)
MCH: 30.7 pg (ref 26.0–34.0)
MCHC: 34.4 g/dL (ref 30.0–36.0)
MCV: 89.2 fL (ref 80.0–100.0)
Platelets: 257 K/uL (ref 150–400)
RBC: 4.43 MIL/uL (ref 4.22–5.81)
RDW: 13.6 % (ref 11.5–15.5)
WBC: 16.8 K/uL — ABNORMAL HIGH (ref 4.0–10.5)
nRBC: 0 % (ref 0.0–0.2)

## 2024-08-22 LAB — MAGNESIUM: Magnesium: 2.2 mg/dL (ref 1.7–2.4)

## 2024-08-22 LAB — OSMOLALITY: Osmolality: 277 mosm/kg (ref 275–295)

## 2024-08-22 LAB — SURGICAL PATHOLOGY

## 2024-08-22 SURGERY — COLONOSCOPY
Anesthesia: Monitor Anesthesia Care

## 2024-08-22 MED ORDER — LIDOCAINE 2% (20 MG/ML) 5 ML SYRINGE
INTRAMUSCULAR | Status: DC | PRN
Start: 2024-08-22 — End: 2024-08-22
  Administered 2024-08-22: 60 mg via INTRAVENOUS

## 2024-08-22 MED ORDER — PROPOFOL 10 MG/ML IV BOLUS
INTRAVENOUS | Status: DC | PRN
  Administered 2024-08-22: 20 mg via INTRAVENOUS

## 2024-08-22 MED ORDER — PHENYLEPHRINE 80 MCG/ML (10ML) SYRINGE FOR IV PUSH (FOR BLOOD PRESSURE SUPPORT)
PREFILLED_SYRINGE | INTRAVENOUS | Status: DC | PRN
  Administered 2024-08-22: 160 ug via INTRAVENOUS
  Administered 2024-08-22 (×3): 80 ug via INTRAVENOUS
  Administered 2024-08-22: 160 ug via INTRAVENOUS

## 2024-08-22 MED ORDER — PROPOFOL 500 MG/50ML IV EMUL
INTRAVENOUS | Status: DC | PRN
  Administered 2024-08-22: 120 ug/kg/min via INTRAVENOUS

## 2024-08-22 NOTE — Progress Notes (Signed)
 PT Cancellation Note  Patient Details Name: Dustin [name] MRN: [id number] DOB: 03/04/1956   Cancelled Treatment:    Reason Eval/Treat Not Completed: (P) Patient at procedure or test/unavailable. Will plan to follow-up as time permits.   Theo Ferretti, PT, DPT Acute Rehabilitation Services  Office: [phone]   Theo CHRISTELLA Ferretti 08/22/2024, 9:41 AM

## 2024-08-22 NOTE — Plan of Care (Signed)
   Problem: Education: Goal: Knowledge of General Education information will improve Description Including pain rating scale, medication(s)/side effects and non-pharmacologic comfort measures Outcome: Progressing   Problem: Health Behavior/Discharge Planning: Goal: Ability to manage health-related needs will improve Outcome: Progressing

## 2024-08-22 NOTE — Anesthesia Postprocedure Evaluation (Signed)
 Anesthesia Post Note  Patient: Dustin [name]  Procedure(s) Performed: COLONOSCOPY     Patient location during evaluation: Endoscopy Anesthesia Type: MAC Level of consciousness: awake and alert Pain management: pain level controlled Vital Signs Assessment: post-procedure vital signs reviewed and stable Respiratory status: spontaneous breathing, nonlabored ventilation, respiratory function stable and patient connected to nasal cannula oxygen Cardiovascular status: stable and blood pressure returned to baseline Postop Assessment: no apparent nausea or vomiting Anesthetic complications: no   No notable events documented.  Last Vitals:  Vitals:   08/22/24 1120 08/22/24 1130  BP: (!) 102/52 118/69  Pulse: 79 88  Resp: (!) 29 (!) 23  Temp:    SpO2: 98% 99%    Last Pain:  Vitals:   08/22/24 1130  TempSrc:   PainSc: 0-No pain                 Rome Ade

## 2024-08-22 NOTE — Progress Notes (Signed)
 Occupational Therapy Treatment Patient Details Name: Dustin [name] MRN: [id number] DOB: April 10, 1956 Today's Date: 08/22/2024   History of present illness 68 yo male presents [facility] on 10/10 with progressive LE weakness, inability to ambulate, and bowel/bladder changes. 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 CT abd/pelvis shows splenic lesion. fall 10/11, assisted to floor by staff and no injuries sustained. 10/16 S/P resection of intramedullary mass, T1-T3 laminectomy 10/16.  PMH includes HTN, HLD, hep C, liver cirrhosis, prior DVT, GI carcinoid tumor, remote history of ETOH use.   OT comments  Pt greeted in supine, agreeable to participate with OT. Pt transferred to EOB with mod A via log roll with good effort. Improving sitting balance & trunk control today, intermittent min A to correct occasional posterior LOB during laundry folding task at EOB. Pt able to scoot towards [facility] with min A, OT assisting with bed pads, demo's improving functional UE strength. Remains profoundly weak in BLE. Overall tolerated session well; OT to continue to follow.      If plan is discharge home, recommend the following:  Two people to help with walking and/or transfers;Assistance with cooking/housework;Assist for transportation;Help with stairs or ramp for entrance;A lot of help with bathing/dressing/bathroom   Equipment Recommendations  Other (comment) (defer)    Recommendations for Other Services      Precautions / Restrictions Precautions Precautions: Fall;Back Precaution Booklet Issued:  (reviewed) Required Braces or Orthoses:  (no brace needed per Dr. Darnella 10/17) Restrictions Weight Bearing Restrictions Per Provider Order: No       Mobility Bed Mobility Overal bed mobility: Needs Assistance Bed Mobility: Rolling, Sidelying to Sit, Sit to Sidelying Rolling: Min assist,  Used rails Sidelying to sit: Mod assist, Used rails     Sit to sidelying: Mod assist, Used rails General bed mobility comments: mild HOB elevation, exited to the R side    Transfers                         Balance Overall balance assessment: Needs assistance Sitting-balance support: No upper extremity supported, Feet supported Sitting balance-Leahy Scale: Fair Sitting balance - Comments: CGA-min A for dynamic balance during IADL task at EOB for occasional posterior LOB                                   ADL either performed or assessed with clinical judgement   ADL Overall ADL's : Needs assistance/impaired                     Lower Body Dressing: Total assistance;Bed level Lower Body Dressing Details (indicate cue type and reason): donning socks               General ADL Comments: pt engaged in laundry folding task at EOB, intermittent cues to right posture at [facility]    Extremity/Trunk Assessment              Vision       Perception     Praxis     Communication Communication Communication: No apparent difficulties   Cognition Arousal: Alert Behavior During Therapy: [facility] for tasks assessed/performed Cognition: No apparent impairments  Following commands: Intact        Cueing   Cueing Techniques: Verbal cues, Visual cues  Exercises      Shoulder Instructions       General Comments      Pertinent Vitals/ Pain       Pain Assessment Pain Assessment: 0-10 Pain Score: 7  Pain Location: back Pain Descriptors / Indicators: Discomfort Pain Intervention(s): Limited activity within patient's tolerance, Monitored during session, Repositioned  Home Living                                          Prior Functioning/Environment              Frequency  Min 2X/week        Progress Toward Goals  OT Goals(current goals can now be found in the care plan  section)  Progress towards OT goals: Progressing toward goals     Plan      Co-evaluation                 AM-PAC OT 6 Clicks Daily Activity     Outcome Measure   Help from another person eating meals?: None Help from another person taking care of personal grooming?: A Little Help from another person toileting, which includes using toliet, bedpan, or urinal?: Total Help from another person bathing (including washing, rinsing, drying)?: A Lot Help from another person to put on and taking off regular upper body clothing?: A Little Help from another person to put on and taking off regular lower body clothing?: A Lot 6 Click Score: 15    End of Session    OT Visit Diagnosis: Unsteadiness on feet (R26.81);Muscle weakness (generalized) (M62.81)   Activity Tolerance Patient tolerated treatment well   Patient Left in bed;with call bell/phone within reach;with SCD's reapplied   Nurse Communication Mobility status        Time: 8653-8585 OT Time Calculation (min): 28 min  Charges: OT General Charges $OT Visit: 1 Visit OT Treatments $Therapeutic Activity: 23-37 mins  [name], MSOT, OTR/L Acute Rehabilitation Services [phone] Secure Chat Preferred  [name] 08/22/2024, 5:10 PM

## 2024-08-22 NOTE — Progress Notes (Addendum)
 Physical Therapy Treatment Patient Details Name: Dustin [name] MRN: [id number] DOB: 10/04/1956 Today's Date: 08/22/2024   History of Present Illness 68 yo male presents to [facility] on 10/10 with progressive LE weakness, inability to ambulate, and bowel/bladder changes. 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 CT abd/pelvis shows splenic lesion. fall 10/11, assisted to floor by staff and no injuries sustained. 10/16 S/P resection of intramedullary mass, T1-T3 laminectomy 10/16.  PMH includes HTN, HLD, hep C, liver cirrhosis, prior DVT, GI carcinoid tumor, remote history of ETOH use.    PT Comments  Focused session on strengthening pt's lower extremities through serial sit <> stands using the stedy. The pt required modA the first rep but quickly progressed to minA for subsequent reps from stedy flaps or from low recliner seat. The pt is demonstrating gradual progress with activating his quads and gluteals simultaneously, but remains very weak in his legs, resulting in intermittent buckling. Educated pt to perform quad and glut sets outside of therapy sessions. He is at high risk for falls. Will continue to follow acutely. Recommending nursing utilize the stedy for transfer safety at this time.     If plan is discharge home, recommend the following: Help with stairs or ramp for entrance;Two people to help with bathing/dressing/bathroom;Assistance with cooking/housework;Assist for transportation;Two people to help with walking and/or transfers   Can travel by private vehicle     No  Equipment Recommendations  Other (comment) (defer)    Recommendations for Other Services       Precautions / Restrictions Precautions Precautions: Fall;Back Precaution Booklet Issued: Yes (comment) Recall of Precautions/Restrictions: Impaired Precaution/Restrictions Comments: reviewed with  pt Required Braces or Orthoses:  (no brace needed per Dr. Darnella 10/17) Restrictions Weight Bearing Restrictions Per Provider Order: No     Mobility  Bed Mobility Overal bed mobility: Needs Assistance Bed Mobility: Rolling, Sidelying to Sit Rolling: Min assist, Used rails Sidelying to sit: Mod assist, HOB elevated, Used rails       General bed mobility comments: Cues needed to flex R leg with pt assisting R leg into flexion with his arms. Cues to log roll to L with pt using bed rail, minA to manage R leg and trunk. ModA needed to bring legs off L EOB and to ascend trunk, cuing pt to prop up on L elbow to ascend trunk.    Transfers Overall transfer level: Needs assistance Equipment used: Ambulation equipment used Transfers: Sit to/from Stand, Bed to chair/wheelchair/BSC Sit to Stand: Min assist, Mod assist           General transfer comment: Pt stood from EOB 1x, stedy flaps 10x, and recliner 2x using stedy throughout. Pt needed verbal and tactile cues to extend knees and hips with each transfer. ModA to transfer to stand the first rep then quickly progressed to minA the subsequent reps. Stedy utilized to transfer pt OOB to recliner. Transfer via Lift Equipment: Stedy  Ambulation/Gait             Pre-gait activities: cued pt to laterally shift his weight while in stedy but pt fatigued after serial sit <> stands and unable to stand long or shift weight and maintain hip and knee extension     Stairs             Wheelchair Mobility     Tilt Bed    Modified Rankin (Stroke Patients Only)  Balance Overall balance assessment: Needs assistance Sitting-balance support: No upper extremity supported, Feet supported Sitting balance-Leahy Scale: Fair Sitting balance - Comments: CGA for safety sitting statically EOB   Standing balance support: Bilateral upper extremity supported, During functional activity, Reliant on assistive device for balance Standing  balance-Leahy Scale: Poor Standing balance comment: reliant on bil UE support and minA, cues needed to extend hips and knees, stood 13x for ~5-15 sec  each                            Communication Communication Communication: No apparent difficulties  Cognition Arousal: Alert Behavior During Therapy: WFL for tasks assessed/performed   PT - Cognitive impairments: Safety/Judgement, Problem solving, Memory, Sequencing                       PT - Cognition Comments: at times requires repeated cues and safety cues, also benefits from step-by-step instructions Following commands: Intact      Cueing Cueing Techniques: Gestural cues, Verbal cues, Tactile cues  Exercises Other Exercises Other Exercises: x10 serial sit <> stands in stedy from stedy flaps, x2 from recliner, minA    General Comments        Pertinent Vitals/Pain Pain Assessment Pain Assessment: No/denies pain Pain Intervention(s): Monitored during session    Home Living                          Prior Function            PT Goals (current goals can now be found in the care plan section) Acute Rehab PT Goals Patient Stated Goal: to improve PT Goal Formulation: With patient Time For Goal Achievement: 08/26/24 Potential to Achieve Goals: Good Progress towards PT goals: Progressing toward goals    Frequency    Min 3X/week      PT Plan      Co-evaluation              AM-PAC PT 6 Clicks Mobility   Outcome Measure  Help needed turning from your back to your side while in a flat bed without using bedrails?: A Little Help needed moving from lying on your back to sitting on the side of a flat bed without using bedrails?: A Lot Help needed moving to and from a bed to a chair (including a wheelchair)?: Total Help needed standing up from a chair using your arms (e.g., wheelchair or bedside chair)?: A Lot Help needed to walk in hospital room?: Total Help needed climbing 3-5  steps with a railing? : Total 6 Click Score: 10    End of Session   Activity Tolerance: Patient tolerated treatment well Patient left: in chair;with call bell/phone within reach;with chair alarm set;with nursing/sitter in room Nurse Communication: Mobility status;Need for lift equipment (stedy) PT Visit Diagnosis: Other abnormalities of gait and mobility (R26.89);Unsteadiness on feet (R26.81);Other symptoms and signs involving the nervous system (R29.898);Muscle weakness (generalized) (M62.81);Difficulty in walking, not elsewhere classified (R26.2)     Time: 8452-8396 PT Time Calculation (min) (ACUTE ONLY): 16 min  Charges:    $Therapeutic Exercise: 8-22 mins PT General Charges $$ ACUTE PT VISIT: 1 Visit                     Theo Ferretti, PT, DPT Acute Rehabilitation Services  Office: [phone]    Theo CHRISTELLA Ferretti 08/22/2024, 4:13 PM

## 2024-08-22 NOTE — Anesthesia Preprocedure Evaluation (Signed)
 Anesthesia Evaluation  Patient identified by MRN, date of birth, ID band Patient awake    Reviewed: Allergy & Precautions, NPO status , Patient's Chart, lab work & pertinent test results, reviewed documented beta blocker date and time   History of Anesthesia Complications Negative for: history of anesthetic complications  Airway Mallampati: II  TM Distance: >3 FB Neck ROM: Full    Dental  (+) Missing, Poor Dentition, Dental Advisory Given   Pulmonary shortness of breath and with exertion, neg sleep apnea, neg COPD, Patient abstained from smoking.Not current smoker   Pulmonary exam normal breath sounds clear to auscultation       Cardiovascular Exercise Tolerance: Good METShypertension, Pt. on medications (-) CAD and (-) Past MI + dysrhythmias Atrial Fibrillation  Rhythm:Irregular Rate:Normal - Systolic murmurs Echo 05/16/24  1. Systolic anterior motion of the mitral valve, severe LVH, mild MR,  Peak LVOT gradient while in Afib (Evalute for HCM, cardiac  amyloidosis (apical sparing noted on strain imaging), or other  infiltrative cardiomyopathy). . Left ventricular  ejection fraction, by estimation, is >75%. The left ventricle has  hyperdynamic function. The left ventricle has no regional wall motion  abnormalities. There is severe left ventricular hypertrophy. Left  ventricular diastolic function could not be  evaluated. The average left ventricular global longitudinal strain is  -10.2 %. The global longitudinal strain is abnormal.   2. Right ventricular systolic function is normal. The right ventricular  size is normal. There is normal pulmonary artery systolic pressure.   3. Left atrial size was mildly dilated.   4. The mitral valve is grossly normal. Mild mitral valve regurgitation.  No evidence of mitral stenosis.   5. The aortic valve is normal in structure. Aortic valve regurgitation is  not visualized. No aortic  stenosis is present.   EKG Atrial fibrillation Left ventricular hypertrophy with repolarization abnormality ( Sokolow-Lyon ) Anteroseptal infarct , age undetermined   Neuro/Psych negative neurological ROS  negative psych ROS   GI/Hepatic ,neg GERD  ,,(+)     (-) substance abuse  , Hepatitis -GI Bleed Hx/o carcinoid tumor   Endo/Other  neg diabetes  Eliquis  therapy- last dose over a week ago  Renal/GU negative Renal ROS  negative genitourinary   Musculoskeletal  (+) Arthritis , Osteoarthritis,    Abdominal   Peds  Hematology  (+) Blood dyscrasia, anemia   Anesthesia Other Findings Past Medical History: No date: Arthritis     Comment:  bil knees, left hand No date: Atrial fibrillation (HCC) No date: Carcinoid tumor (HCC) No date: DVT of upper extremity (deep vein thrombosis) (HCC)     Comment:  left brachial vein, 12/2010 No date: Helicobacter pylori gastritis No date: History of blood clots     Comment:  to left arm No date: Hypertension No date: Shortness of breath dyspnea     Comment:  with exertion 2017: Tubular adenoma of colon  Reproductive/Obstetrics                              Anesthesia Physical Anesthesia Plan  ASA: 3  Anesthesia Plan: MAC   Post-op Pain Management: Minimal or no pain anticipated   Induction: Intravenous  PONV Risk Score and Plan: 2 and Treatment may vary due to age or medical condition and Propofol  infusion  Airway Management Planned: Natural Airway, Nasal Cannula and Simple Face Mask  Additional Equipment: None  Intra-op Plan:   Post-operative Plan:   Informed  Consent: I have reviewed the patients History and Physical, chart, labs and discussed the procedure including the risks, benefits and alternatives for the proposed anesthesia with the patient or authorized representative who has indicated his/her understanding and acceptance.     Dental advisory given  Plan Discussed with: CRNA and  Anesthesiologist  Anesthesia Plan Comments: (Discussed risks of anesthesia with patient, including possibility of difficulty with spontaneous ventilation under anesthesia necessitating airway intervention, PONV, and rare risks such as cardiac or respiratory or neurological events, and allergic reactions. Discussed the role of CRNA in patient's perioperative care. Patient understands.)         Anesthesia Quick Evaluation

## 2024-08-22 NOTE — Plan of Care (Signed)

## 2024-08-22 NOTE — Progress Notes (Addendum)
 OT Cancellation Note  Patient Details Name: Dustin [name] MRN: [id number] DOB: 1956/10/05   Cancelled Treatment:    Reason Eval/Treat Not Completed: Patient at procedure or test/ unavailable. Will continue efforts as able.   [name], MSOT, OTR/L Acute Rehabilitation Services [phone] Secure Chat Preferred  [name] 08/22/2024, 10:19 AM

## 2024-08-22 NOTE — Progress Notes (Signed)
 PROGRESS NOTE  Dustin [name] FMW:[id number] DOB: December 26, 1955 DOA: 08/08/2024 PCP: Delbert Clam, MD   LOS: 14 days   Brief Narrative / Interim history: 68 y.o. M with HTN, compensated hep C cirrhosis, hx VTE on Eliquis , and hx NET in 2017 who presented with paresthesias, found to have thoracic spine lesion.  He underwent surgery on 10/16   Subjective / 24h Interval events: Had a rough night, underwent prep for colonoscopy.  Assesement and Plan: Principal problem T2 intramedullary spinal lesion - Status post intramedullary mass resection and laminectomy on 10/16 by Dr. Darnella.  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  Has been placed on dexamethasone  taper, continue as per neurosurgery.  He will need rehab   Active problems Melena -patient with new onset melena 10/22, also bright red blood.  GI consulted, underwent EGD 10/23, no clear source of bleeding, colonoscopy planned for today  Persistent atrial fibrillation - Patient was evaluated in the ER for vomiting in July 2025, found to have new onset atrial flutter, started on Eliquis .  Seen in the atrial fibrillation clinic, and was in persistent A-fib. 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 Given his CHA2DS2-VASc score of only 2, I recommend consideration of NOT resuming anticoagulation at that time, or consideration of a watchman.  Urinary retention - Due to spinal lesion.  Continue Foley on discharge, failed voiding trial 10/21.  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   Hyponatremia -slightly worsening sodium this morning, obtain urine and serum Osmo's, urine sodium, hold hydrochlorothiazide   History of DVT - Patient had a nonocclusive brachial vein DVT after hand surgery in 2012.  No clots since then.  Treated with anticoagulation at that time, then stopped.  Recently anticoagulation resumed in setting of A-fib, see #2 above.  Scheduled Meds:  [MAR Hold] amLODipine   10 mg Oral Daily   [MAR Hold] atorvastatin   20 mg Oral Daily   [MAR Hold] carvedilol   6.25 mg Oral BID WC   [MAR Hold] Chlorhexidine  Gluconate Cloth  6 each Topical Daily   [MAR Hold] cyclobenzaprine  5 mg Oral TID   [MAR Hold] dexamethasone   2 mg Oral Daily   Followed by   [FJM Hold] dexamethasone   1 mg Oral Daily   [MAR Hold] feeding supplement  1 Container Oral TID BM   [MAR Hold] pantoprazole (PROTONIX) IV  40 mg Intravenous Q12H   [MAR Hold] sodium chloride  flush  3 mL Intravenous Q12H   Continuous Infusions:  sodium chloride  20 mL/hr at 08/22/24 0540   PRN Meds:.[MAR Hold] acetaminophen  **OR** [MAR Hold] acetaminophen , [MAR Hold] albuterol , [MAR Hold] methocarbamol , [MAR Hold] mouth rinse, [MAR Hold] oxyCODONE -acetaminophen   Current Outpatient Medications  Medication Instructions   acetaminophen  (TYLENOL ) 500 mg, Oral, Every 6 hours PRN   acetaminophen -codeine  (TYLENOL  #3) 300-30 MG tablet 1 tablet, Oral, Every 6 hours PRN   albuterol  (VENTOLIN  HFA) 108 (90 Base) MCG/ACT inhaler 2 puffs, Inhalation, Every 6 hours PRN   amLODipine  (NORVASC ) 10 mg, Oral, Daily  apixaban  (ELIQUIS ) 5 mg, Oral, 2 times daily   atorvastatin  (LIPITOR) 20 MG tablet TAKE 1 TABLET(20 MG) BY MOUTH DAILY   carvedilol  (COREG ) 6.25 MG tablet TAKE 1 TABLET(6.25 MG) BY MOUTH TWICE DAILY WITH A MEAL   cetirizine  (ZYRTEC ) 10 mg, Oral, Daily   dexamethasone  (DECADRON ) 1 MG tablet Take 2 tablets (2 mg total) by mouth every 12 (twelve)  hours for 1 day, THEN 2 tablets (2 mg total) daily for 3 days, THEN 1 tablet (1 mg total) daily for 3 days.   diclofenac  Sodium (VOLTAREN ) 1 % GEL APPLY 4 GRAMS EXTERNALLY TO THE AFFECTED AREA FOUR TIMES DAILY   fluticasone  (FLONASE ) 50 MCG/ACT nasal spray PLACE 1 SPRAY IN BOTH NOSTRILS DAILY   hydrochlorothiazide  (HYDRODIURIL ) 25 mg, Oral, Daily   omeprazole  (PRILOSEC) 20 MG capsule TAKE 1 CAPSULE(20 MG) BY MOUTH DAILY   oxyCODONE -acetaminophen  (PERCOCET) 7.5-325 MG tablet 1 tablet, Oral, Every 4 hours PRN   traMADol  (ULTRAM ) 50 mg, Oral, Every 6 hours PRN    Diet Orders (From admission, onward)     Start     Ordered   08/22/24 0001  Diet NPO time specified Except for: Sips with Meds  Diet effective midnight       Question:  Except for  Answer:  Sips with Meds   08/21/24 1423            DVT prophylaxis: Place and maintain sequential compression device Start: 08/18/24 1209   Lab Results  Component Value Date   PLT 257 08/22/2024      Code Status: Full Code  Family Communication: No family at bedside  Status is: Inpatient Remains inpatient appropriate because: Colonoscopy today   Level of care: Med-Surg  Consultants:  GI Neurosurgery  Oncology   Objective: Vitals:   08/22/24 0012 08/22/24 0446 08/22/24 0805 08/22/24 0946  BP: 108/62 119/65 114/65 99/68  Pulse: 89 84 92 88  Resp: 18 17 18 10   Temp: 98.4 F (36.9 C) (!) 97.5 F (36.4 C) 97.8 F (36.6 C) (!) 97.1 F (36.2 C)  TempSrc: Oral Oral Oral Temporal  SpO2: 99% 100% 98% 94%  Weight:    95.3 kg  Height:    5' 11 (1.803 m)    Intake/Output Summary (Last 24 hours) at 08/22/2024 1005 Last data filed at 08/22/2024 9357 Gross per 24 hour  Intake 1134.55 ml  Output 1400 ml  Net -265.45 ml   Wt Readings from Last 3 Encounters:  08/22/24 95.3 kg  08/05/24 95.2 kg  06/08/24 97.1 kg    Examination: Constitutional: NAD Eyes: lids and conjunctivae normal, no scleral icterus ENMT: mmm Neck:  normal, supple Respiratory: clear to auscultation bilaterally, no wheezing, no crackles.  Cardiovascular: Regular rate and rhythm, no murmurs / rubs / gallops. No LE edema. Abdomen: soft, no distention, no tenderness. Bowel sounds positive.   Data Reviewed: I have independently reviewed following labs and imaging studies   CBC Recent Labs  Lab 08/20/24 1420 08/20/24 1748 08/21/24 0003 08/22/24 0111  WBC 17.9*  --   --  16.8*  HGB 14.0 13.7 13.4 13.6  HCT 41.5 40.2 39.1 39.5  PLT 324  --   --  257  MCV 88.9  --   --  89.2  MCH 30.0  --   --  30.7  MCHC 33.7  --   --  34.4  RDW 13.8  --   --  13.6    Recent Labs  Lab 08/19/24 1008 08/21/24 0124 08/22/24  0111  NA 130* 129* 127*  K 3.9 4.3 4.1  CL 93* 91* 91*  CO2 29 28 25   GLUCOSE 170* 144* 121*  BUN 22 22 19   CREATININE 0.87 0.82 0.83  CALCIUM  8.6* 8.5* 8.4*  AST  --   --  35  ALT  --   --  25  ALKPHOS  --   --  39  BILITOT  --   --  2.0*  ALBUMIN  --   --  3.1*  MG  --   --  2.2    ------------------------------------------------------------------------------------------------------------------ No results for input(s): CHOL, HDL, LDLCALC, TRIG, CHOLHDL, LDLDIRECT in the last 72 hours.  Lab Results  Component Value Date   HGBA1C 6.0 05/05/2024   ------------------------------------------------------------------------------------------------------------------ No results for input(s): TSH, T4TOTAL, T3FREE, THYROIDAB in the last 72 hours.  Invalid input(s): FREET3  Cardiac Enzymes No results for input(s): CKMB, TROPONINI, MYOGLOBIN in the last 168 hours.  Invalid input(s): CK ------------------------------------------------------------------------------------------------------------------ No results found for: BNP  CBG: No results for input(s): GLUCAP in the last 168 hours.  Recent Results (from the past 240 hours)  Surgical pcr screen     Status: Abnormal   Collection  Time: 08/14/24  1:29 AM   Specimen: Nasal Mucosa; Nasal Swab  Result Value Ref Range Status   MRSA, PCR NEGATIVE NEGATIVE Final   Staphylococcus aureus POSITIVE (A) NEGATIVE Final    Comment: (NOTE) The Xpert SA Assay (FDA approved for NASAL specimens in patients 22 years of age and older), is one component of a comprehensive surveillance program. It is not intended to diagnose infection nor to guide or monitor treatment. Performed at [facility], [address] [place], [place] [address]      Radiology Studies: No results found.   Nilda Fendt, MD, PhD [facility]  Between 7 am - 7 pm I am available, please contact me via Amion (for emergencies) or Securechat (non urgent messages)  Between 7 pm - 7 am I am not available, please contact night coverage MD/APP via Amion

## 2024-08-22 NOTE — Transfer of Care (Signed)
 Immediate Anesthesia Transfer of Care Note  Patient: Dustin [name]  Procedure(s) Performed: COLONOSCOPY  Patient Location: PACU  Anesthesia Type:MAC  Level of Consciousness: awake, drowsy, and patient cooperative  Airway & Oxygen Therapy: Patient Spontanous Breathing and Patient connected to nasal cannula oxygen  Post-op Assessment: Report given to RN, Post -op Vital signs reviewed and stable, and Patient moving all extremities X 4  Post vital signs: Reviewed and stable  Last Vitals:  Vitals Value Taken Time  BP 107/65 08/22/24 11:15  Temp    Pulse 79 08/22/24 11:15  Resp 28 08/22/24 11:15  SpO2 98 % 08/22/24 11:15    Last Pain:  Vitals:   08/22/24 1115  TempSrc:   PainSc: 0-No pain      Patients Stated Pain Goal: 0 (08/21/24 2115)  Complications: No notable events documented.

## 2024-08-22 NOTE — Interval H&P Note (Signed)
 History and Physical Interval Note:  08/22/2024 10:08 AM  Dustin [name]  has presented today for surgery, with the diagnosis of Melena, heme positive stool.  The various methods of treatment have been discussed with the patient and family. After consideration of risks, benefits and other options for treatment, the patient has consented to  Procedure(s): COLONOSCOPY (N/A) as a surgical intervention.  The patient's history has been reviewed, patient examined, no change in status, stable for surgery.  I have reviewed the patient's chart and labs.  Questions were answered to the patient's satisfaction.    Hemoglobin remains normal and stable at 13.6  [name]

## 2024-08-22 NOTE — TOC Progression Note (Signed)
 Transition of Care [facility]) - Progression Note    Patient Details  Name: Dustin [name] MRN: [id number] Date of Birth: [birth year]  Transition of [facility]) CM/SW Contact  Luann SHAUNNA [place], [place] Phone Number: 08/22/2024, 1:34 PM  Clinical Narrative:     MD notified CSW of anticipated DC tomorrow. [facility] confirmed they could admit pt tomorrow with SNF authorization. Pt will need a new auth submitted. CSW submitted auth in online portal. Ref# [id number]. Auth is pending at this time.    Expected Discharge Plan: Skilled Nursing Facility Barriers to Discharge: Continued Medical Work up, English as a second language teacher               Expected Discharge Plan and Services         Expected Discharge Date: 08/20/24                                     Social Drivers of Health (SDOH) Interventions SDOH Screenings   Food Insecurity: No Food Insecurity (05/01/2024)  Housing: Unknown (05/01/2024)  Transportation Needs: No Transportation Needs (05/01/2024)  Utilities: Not At Risk (03/25/2024)  Alcohol Screen: Low Risk  (05/01/2024)  Depression (PHQ2-9): Low Risk  (08/05/2024)  Financial Resource Strain: Low Risk  (05/01/2024)  Physical Activity: Sufficiently Active (05/01/2024)  Social Connections: Moderately Isolated (05/01/2024)  Stress: No Stress Concern Present (05/01/2024)  Tobacco Use: Low Risk  (08/14/2024)  Health Literacy: Adequate Health Literacy (03/25/2024)    Readmission Risk Interventions     No data to display

## 2024-08-22 NOTE — Op Note (Signed)
 [facility] Patient Name: Dustin [name] Procedure Date : 08/22/2024 MRN: [id number] Attending MD: Victory CROME. Legrand , MD, [id number] Date of Birth: 19-Oct-1956 CSN: [id number] Age: 68 Admit Type: Inpatient Procedure:                Colonoscopy Indications:              Hematochezia, Melena (variable reports from nursing                            on what the single episode of bleeding looked like)                           No bleeding source on EGD yesterday. Hemoglobin has                            remained normal Providers:                Victory L. Legrand, MD, Jacquelyn Jaci Pierce, RN,                            Felice Sar, Technician Referring MD:             [facility]:                Monitored Anesthesia Care Complications:            No immediate complications. Estimated Blood Loss:     Estimated blood loss was minimal. Procedure:                Pre-Anesthesia Assessment:                           - Prior to the procedure, a History and Physical                            was performed, and patient medications and                            allergies were reviewed. The patient's tolerance of                            previous anesthesia was also reviewed. The risks                            and benefits of the procedure and the sedation                            options and risks were discussed with the patient.                            All questions were answered, and informed consent                            was obtained. Prior Anticoagulants: The patient has  taken no anticoagulant or antiplatelet agents. ASA                            Grade Assessment: III - A patient with severe                            systemic disease. After reviewing the risks and                            benefits, the patient was deemed in satisfactory                            condition to undergo the procedure.                           After  obtaining informed consent, the colonoscope                            was passed under direct vision. Throughout the                            procedure, the patient's blood pressure, pulse, and                            oxygen saturations were monitored continuously. The                            CF-HQ190L ([id number]) Olympus colonoscope was                            introduced through the anus and advanced to the the                            terminal ileum, with identification of the                            appendiceal orifice and IC valve. The colonoscopy                            was somewhat difficult due to poor bowel prep.                            Successful completion of the procedure was aided by                            lavage. The patient tolerated the procedure well.                            The quality of the bowel preparation was poor. The                            terminal ileum, ileocecal valve, appendiceal  orifice, and rectum were photographed. Scope In: 10:36:30 AM Scope Out: 11:01:09 AM Scope Withdrawal Time: 0 hours 21 minutes 9 seconds  Total Procedure Duration: 0 hours 24 minutes 39 seconds  Findings:      The perianal and digital rectal examinations were normal.      The terminal ileum appeared normal.      Three sessile polyps were found in the transverse colon, ascending colon       and cecum. The polyps were diminutive in size. These polyps were removed       with a cold snare. Resection and retrieval were complete.      Multiple diverticula were found in the left colon.      A single (solitary) fifteen to 18 mm ulcer was found in the distal       rectum. No bleeding was present. Clean-based and shallow, heaped edge on       one aspect but benign-appearing. No stigmata of recent bleeding were       seen. Biopsies were taken with a cold forceps for histology.      Retroflexion in the rectum was not performed due to  anatomy.      The exam was otherwise without abnormality. Impression:               - Preparation of the colon was poor.                           - The examined portion of the ileum was normal.                           - Three diminutive polyps in the transverse colon,                            in the ascending colon and in the cecum, removed                            with a cold snare. Resected and retrieved.                           - Diverticulosis in the left colon.                           - A single (solitary) ulcer in the distal rectum.                            Biopsied.                           - The examination was otherwise normal, given                            limitations of bowel prep.                           This patient appears to have most likely bled from                            diverticulosis or the visualized rectal  ulcer.                            Ulcer looks acute and seems most likely from                            post-operative constipation. Bleeding was a single                            episode 2 days ago and hemoglobin has remained                            normal. Recommendation:           - Return patient to hospital ward for ongoing care.                           - Resume regular diet.                           - Continue present medications. Miralax  as needed                            for constipation, especially in th post-operative                            period.                           - Await pathology results.                           - Repeat colonoscopy in 6 months (ideally, but                            within 12 months) for polyp surveillance. By then,                            patient is expected to have recovered from surgery                            and regained sufficient mobility to achieve better                            bowel prep quality.                           - Patient can be discharged from a GI  perspective.                           Oral anticoagulation (off for weeks now) can be                            resumed as soon as tomorrow if needed.                           -  Patient will be given results and copy of                            procedure report. [facility] doc will be notified as                            well.                           Inpatient GI service signing off. Procedure Code(s):        --- Professional ---                           [phone], Colonoscopy, flexible; with removal of                            tumor(s), polyp(s), or other lesion(s) by snare                            technique                           45380, 59, Colonoscopy, flexible; with biopsy,                            single or multiple Diagnosis Code(s):        --- Professional ---                           D12.3, Benign neoplasm of transverse colon (hepatic                            flexure or splenic flexure)                           D12.2, Benign neoplasm of ascending colon                           D12.0, Benign neoplasm of cecum                           K62.6, Ulcer of anus and rectum                           K92.1, Melena (includes Hematochezia)                           K57.30, Diverticulosis of large intestine without                            perforation or abscess without bleeding CPT copyright 2022 [employer]. All rights reserved. The codes documented in this report are preliminary and upon coder review may  be revised to meet current compliance requirements. Everet Flagg L. Legrand, MD 08/22/2024 11:14:38 AM This report has been signed electronically. Number of Addenda: 0

## 2024-08-23 DIAGNOSIS — D497 Neoplasm of unspecified behavior of endocrine glands and other parts of nervous system: Secondary | ICD-10-CM | POA: Diagnosis not present

## 2024-08-23 NOTE — Progress Notes (Signed)
 PROGRESS NOTE  Dustin [name] FMW:[id number] DOB: [birth year] DOA: 08/08/2024 PCP: Delbert Clam, MD   LOS: 15 days   Brief Narrative / Interim history: 68 y.o. M with HTN, compensated hep C cirrhosis, hx VTE on Eliquis , and hx NET in 2017 who presented with paresthesias, found to have thoracic spine lesion.  He underwent surgery on 10/16   Subjective / 24h Interval events: He is feeling better this morning, slept better.  Awaiting rehab  Assesement and Plan: Principal problem T2 intramedullary spinal lesion - Status post intramedullary mass resection and laminectomy on 10/16 by Dr. Darnella.  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  Has been placed on dexamethasone  taper, continue as per neurosurgery.  He will need rehab   Active problems Melena -patient with new onset melena 10/22, also bright red blood.  GI consulted, underwent EGD 10/23, no clear source of bleeding, underwent colonoscopy on 10/24 which showed diverticulosis and a small rectal ulcer which both may have been the source for bleeding  Persistent atrial fibrillation - Patient was evaluated in the ER for vomiting in July 2025, found to have new onset atrial flutter, started on Eliquis .  Seen in the atrial fibrillation clinic, and was in persistent A-fib. 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 Given his CHA2DS2-VASc score of only 2, I recommend consideration of NOT resuming anticoagulation at that time, or consideration of a watchman.  Urinary retention - Due to spinal lesion.  Continue Foley on discharge, failed voiding trial 10/21.  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   Hyponatremia -slightly worsening sodium this morning, obtain urine and serum Osmo's, urine sodium, hold hydrochlorothiazide   History of DVT - Patient had a nonocclusive brachial vein DVT after hand surgery in 2012.  No clots since then.  Treated with anticoagulation at that time, then stopped.  Recently anticoagulation resumed in setting of A-fib, see #2 above.  Scheduled Meds:  amLODipine   10 mg Oral Daily   atorvastatin   20 mg Oral Daily   carvedilol   6.25 mg Oral BID WC   Chlorhexidine  Gluconate Cloth  6 each Topical Daily   cyclobenzaprine  5 mg Oral TID   [START ON 08/24/2024] dexamethasone   1 mg Oral Daily   feeding supplement  1 Container Oral TID BM   pantoprazole (PROTONIX) IV  40 mg Intravenous Q12H   sodium chloride  flush  3 mL Intravenous Q12H   Continuous Infusions:   PRN Meds:.acetaminophen  **OR** acetaminophen , albuterol , methocarbamol , mouth rinse, oxyCODONE -acetaminophen   Current Outpatient Medications  Medication Instructions   acetaminophen  (TYLENOL ) 500 mg, Oral, Every 6 hours PRN   acetaminophen -codeine  (TYLENOL  #3) 300-30 MG tablet 1 tablet, Oral, Every 6 hours PRN   albuterol  (VENTOLIN  HFA) 108 (90 Base) MCG/ACT inhaler 2 puffs, Inhalation, Every 6 hours PRN   amLODipine  (NORVASC ) 10 mg, Oral, Daily   apixaban  (ELIQUIS ) 5 mg, Oral, 2 times daily   atorvastatin  (LIPITOR) 20 MG tablet TAKE 1 TABLET(20 MG) BY MOUTH DAILY   carvedilol  (COREG ) 6.25 MG  tablet TAKE 1 TABLET(6.25 MG) BY MOUTH TWICE DAILY WITH A MEAL   cetirizine  (ZYRTEC ) 10 mg, Oral, Daily   dexamethasone  (DECADRON ) 1 MG tablet Take 2 tablets (2 mg total) by mouth every 12 (twelve) hours for 1 day, THEN 2 tablets (2 mg total) daily for 3 days, THEN 1 tablet (1 mg total) daily for 3 days.   diclofenac  Sodium  (VOLTAREN ) 1 % GEL APPLY 4 GRAMS EXTERNALLY TO THE AFFECTED AREA FOUR TIMES DAILY   fluticasone  (FLONASE ) 50 MCG/ACT nasal spray PLACE 1 SPRAY IN BOTH NOSTRILS DAILY   hydrochlorothiazide  (HYDRODIURIL ) 25 mg, Oral, Daily   omeprazole  (PRILOSEC) 20 MG capsule TAKE 1 CAPSULE(20 MG) BY MOUTH DAILY   oxyCODONE -acetaminophen  (PERCOCET) 7.5-325 MG tablet 1 tablet, Oral, Every 4 hours PRN   traMADol  (ULTRAM ) 50 mg, Oral, Every 6 hours PRN    Diet Orders (From admission, onward)     Start     Ordered   08/22/24 1217  Diet regular Fluid consistency: Thin  Diet effective now       Question:  Fluid consistency:  Answer:  Thin   08/22/24 1216            DVT prophylaxis: Place and maintain sequential compression device Start: 08/18/24 1209   Lab Results  Component Value Date   PLT 257 08/22/2024      Code Status: Full Code  Family Communication: No family at bedside  Status is: Inpatient Remains inpatient appropriate because: Colonoscopy today   Level of care: Med-Surg  Consultants:  GI Neurosurgery  Oncology   Objective: Vitals:   08/22/24 2353 08/23/24 0406 08/23/24 0757 08/23/24 1118  BP: 111/62 114/65 101/63 (!) 113/56  Pulse: 78 70 82 73  Resp:   16 16  Temp: (!) 97.5 F (36.4 C) 97.6 F (36.4 C) 97.6 F (36.4 C) 97.9 F (36.6 C)  TempSrc: Oral Oral Oral Oral  SpO2: 100% 100% 100% 99%  Weight:      Height:        Intake/Output Summary (Last 24 hours) at 08/23/2024 1408 Last data filed at 08/23/2024 0500 Gross per 24 hour  Intake --  Output 850 ml  Net -850 ml   Wt Readings from Last 3 Encounters:  08/22/24 95.3 kg  08/05/24 95.2 kg  06/08/24 97.1 kg    Examination: Constitutional: He is in no distress  Data Reviewed: I have independently reviewed following labs and imaging studies   CBC Recent Labs  Lab 08/20/24 1420 08/20/24 1748 08/21/24 0003 08/22/24 0111  WBC 17.9*  --   --  16.8*  HGB 14.0 13.7 13.4 13.6  HCT 41.5 40.2 39.1 39.5   PLT 324  --   --  257  MCV 88.9  --   --  89.2  MCH 30.0  --   --  30.7  MCHC 33.7  --   --  34.4  RDW 13.8  --   --  13.6    Recent Labs  Lab 08/19/24 1008 08/21/24 0124 08/22/24 0111  NA 130* 129* 127*  K 3.9 4.3 4.1  CL 93* 91* 91*  CO2 29 28 25   GLUCOSE 170* 144* 121*  BUN 22 22 19   CREATININE 0.87 0.82 0.83  CALCIUM  8.6* 8.5* 8.4*  AST  --   --  35  ALT  --   --  25  ALKPHOS  --   --  39  BILITOT  --   --  2.0*  ALBUMIN  --   --  3.1*  MG  --   --  2.2    ------------------------------------------------------------------------------------------------------------------ No results for input(s): CHOL, HDL, LDLCALC, TRIG, CHOLHDL, LDLDIRECT in the last 72 hours.  Lab Results  Component Value Date   HGBA1C 6.0 05/05/2024   ------------------------------------------------------------------------------------------------------------------ No results for input(s): TSH, T4TOTAL, T3FREE, THYROIDAB in the last 72 hours.  Invalid input(s): FREET3  Cardiac Enzymes No results for input(s): CKMB, TROPONINI, MYOGLOBIN in the last 168 hours.  Invalid input(s): CK ------------------------------------------------------------------------------------------------------------------ No results found for: BNP  CBG: No results for input(s): GLUCAP in the last 168 hours.  Recent Results (from the past 240 hours)  Surgical pcr screen     Status: Abnormal   Collection Time: 08/14/24  1:29 AM   Specimen: Nasal Mucosa; Nasal Swab  Result Value Ref Range Status   MRSA, PCR NEGATIVE NEGATIVE Final   Staphylococcus aureus POSITIVE (A) NEGATIVE Final    Comment: (NOTE) The Xpert SA Assay (FDA approved for NASAL specimens in patients 42 years of age and older), is one component of a comprehensive surveillance program. It is not intended to diagnose infection nor to guide or monitor treatment. Performed at [facility], [address],  [place], [place] [address]      Radiology Studies: No results found.   Nilda Fendt, MD, PhD [facility]  Between 7 am - 7 pm I am available, please contact me via Amion (for emergencies) or Securechat (non urgent messages)  Between 7 pm - 7 am I am not available, please contact night coverage MD/APP via Amion

## 2024-08-23 NOTE — Plan of Care (Signed)
  Problem: Education: Goal: Knowledge of General Education information will improve Description: Including pain rating scale, medication(s)/side effects and non-pharmacologic comfort measures Outcome: Progressing   Problem: Clinical Measurements: Goal: Ability to maintain clinical measurements within normal limits will improve Outcome: Progressing   Problem: Clinical Measurements: Goal: Diagnostic test results will improve Outcome: Progressing   

## 2024-08-23 NOTE — TOC Progression Note (Signed)
 Transition of Care [facility]) - Progression Note    Patient Details  Name: Dustin [name] MRN: [id number] Date of Birth: 06-12-1956  [facility] CM/SW Contact  Gwenn Frieze [place], [place] Phone Number: 08/23/2024, 8:47 AM  Clinical Narrative: [facility] for [facility] remains pending at this time. [facility] requesting updated therapy notes which have been sent, reference #[id number].    Frieze Gwenn, MSW, LCSW [phone] (coverage)        Expected Discharge Plan: Skilled Nursing Facility Barriers to Discharge: Continued Medical Work up, English As A Second Language Teacher               Expected Discharge Plan and Services         Expected Discharge Date: 08/20/24                                     Social Drivers of Health (SDOH) Interventions SDOH Screenings   Food Insecurity: No Food Insecurity (05/01/2024)  Housing: Unknown (05/01/2024)  Transportation Needs: No Transportation Needs (05/01/2024)  Utilities: Not At Risk (03/25/2024)  Alcohol Screen: Low Risk  (05/01/2024)  Depression (PHQ2-9): Low Risk  (08/05/2024)  Financial Resource Strain: Low Risk  (05/01/2024)  Physical Activity: Sufficiently Active (05/01/2024)  Social Connections: Moderately Isolated (05/01/2024)  Stress: No Stress Concern Present (05/01/2024)  Tobacco Use: Low Risk  (08/14/2024)  Health Literacy: Adequate Health Literacy (03/25/2024)    Readmission Risk Interventions     No data to display

## 2024-08-24 ENCOUNTER — Ambulatory Visit: Payer: Self-pay | Admitting: Gastroenterology

## 2024-08-24 DIAGNOSIS — D497 Neoplasm of unspecified behavior of endocrine glands and other parts of nervous system: Secondary | ICD-10-CM | POA: Diagnosis not present

## 2024-08-24 NOTE — Plan of Care (Signed)
  Problem: Education: Goal: Knowledge of General Education information will improve Description: Including pain rating scale, medication(s)/side effects and non-pharmacologic comfort measures Outcome: Progressing   Problem: Clinical Measurements: Goal: Respiratory complications will improve Outcome: Progressing   Problem: Activity: Goal: Risk for activity intolerance will decrease Outcome: Progressing   Problem: Nutrition: Goal: Adequate nutrition will be maintained Outcome: Progressing   Problem: Coping: Goal: Level of anxiety will decrease Outcome: Progressing   Problem: Elimination: Goal: Will not experience complications related to bowel motility Outcome: Progressing Goal: Will not experience complications related to urinary retention Outcome: Progressing   Problem: Pain Managment: Goal: General experience of comfort will improve and/or be controlled Outcome: Progressing   Problem: Safety: Goal: Ability to remain free from injury will improve Outcome: Progressing   Problem: Skin Integrity: Goal: Risk for impaired skin integrity will decrease Outcome: Progressing

## 2024-08-24 NOTE — Discharge Summary (Signed)
 Physician Discharge Summary  Dustin [name] FMW:[id number] DOB: [birth year] DOA: 08/08/2024  PCP: Delbert Clam, MD  Admit date: 08/08/2024 Discharge date: 08/24/2024  Admitted From: home Disposition:  SNF  Recommendations for Outpatient Follow-up:  Follow up with PCP in 1-2 weeks Follow up with neurosurgery for postop check   Home Health: none Equipment/Devices: none  Discharge Condition: stable CODE STATUS: Full code Diet Orders (From admission, onward)     Start     Ordered   08/22/24 1217  Diet regular Fluid consistency: Thin  Diet effective now       Question:  Fluid consistency:  Answer:  Thin   08/22/24 1216            Brief Narrative / Interim history: 68 y.o. M with HTN, compensated hep C cirrhosis, hx VTE on Eliquis , and hx NET in 2017 who presented with paresthesias, found to have thoracic spine lesion.  He underwent surgery on 10/16   Hospital Course / Discharge diagnoses: Principal problem T2 intramedullary spinal lesion - Status post intramedullary mass resection and laminectomy on 10/16 by Dr. Darnella.  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  Has been placed on dexamethasone  taper, continue as per neurosurgery.  He will need rehab   Active problems Melena -patient with new onset melena 10/22, also bright red blood.  GI consulted, underwent EGD 10/23, no clear source of bleeding, underwent colonoscopy on 10/24 which showed diverticulosis and a small rectal ulcer which both may have been the source for bleeding.  Hemoglobin has remained stable and bleeding has now resolved  Persistent atrial fibrillation - Patient was evaluated in the ER for vomiting in July 2025, found to have new onset atrial flutter, started on Eliquis .  Seen in the atrial fibrillation clinic, and was in persistent A-fib. 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 Given his CHA2DS2-VASc score of only 2, I recommend consideration of NOT resuming anticoagulation at that time, or consideration of a watchman. Urinary retention - Due to spinal lesion.  Continue Foley on discharge, failed voiding trial 10/21.  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  Hyponatremia -no large shifts, overall stable, hold HCTZ  History of DVT - Patient had a nonocclusive brachial vein DVT after hand surgery in 2012.  No clots since then.  Treated with anticoagulation at that time, then stopped.  Recently anticoagulation resumed in setting of A-fib, see #2 above.  Sepsis ruled out   Discharge Instructions   Allergies as of 08/24/2024   No Known Allergies      Medication List     STOP taking these medications    acetaminophen -codeine  300-30 MG tablet Commonly known as: TYLENOL  #3   apixaban  5 MG Tabs tablet Commonly known as: ELIQUIS    hydrochlorothiazide  25 MG tablet Commonly known as: HYDRODIURIL    traMADol  50 MG tablet Commonly known as: ULTRAM        TAKE these medications    acetaminophen  500 MG tablet Commonly known as: TYLENOL  Take 1  tablet (500 mg total) by mouth every 6 (six) hours as needed. What changed: reasons to take this   albuterol  108 (90 Base) MCG/ACT inhaler Commonly known as: VENTOLIN  HFA INHALE 2 PUFFS INTO THE LUNGS EVERY 6 HOURS AS NEEDED FOR WHEEZING OR SHORTNESS OF BREATH   amLODipine  10 MG tablet Commonly known as: NORVASC  Take 1 tablet (10 mg total) by mouth daily.   atorvastatin  20  MG tablet Commonly known as: LIPITOR TAKE 1 TABLET(20 MG) BY MOUTH DAILY   carvedilol  6.25 MG tablet Commonly known as: COREG  TAKE 1 TABLET(6.25 MG) BY MOUTH TWICE DAILY WITH A MEAL   cetirizine  10 MG tablet Commonly known as: ZYRTEC  Take 1 tablet (10 mg total) by mouth daily. What changed:  when to take this reasons to take this   dexamethasone  1 MG tablet Commonly known as: DECADRON  Take 2 tablets (2 mg total) by mouth every 12 (twelve) hours for 1 day, THEN 2 tablets (2 mg total) daily for 3 days, THEN 1 tablet (1 mg total) daily for 3 days. Start taking on: August 20, 2024   diclofenac  Sodium 1 % Gel Commonly known as: VOLTAREN  APPLY 4 GRAMS EXTERNALLY TO THE AFFECTED AREA FOUR TIMES DAILY What changed:  how much to take how to take this when to take this reasons to take this   fluticasone  50 MCG/ACT nasal spray Commonly known as: FLONASE  PLACE 1 SPRAY IN BOTH NOSTRILS DAILY What changed: See the new instructions.   omeprazole  20 MG capsule Commonly known as: PRILOSEC TAKE 1 CAPSULE(20 MG) BY MOUTH DAILY   oxyCODONE -acetaminophen  7.5-325 MG tablet Commonly known as: PERCOCET Take 1 tablet by mouth every 4 (four) hours as needed for moderate pain (pain score 4-6) or severe pain (pain score 7-10).        Contact information for follow-up providers     Darnella Dorn SAUNDERS, MD Follow up.   Specialty: Neurosurgery Why: please call to schedule a post operative appointment 6 weeks from surgery. Thank you Contact information: [address] [place] [address] [phone]              Contact information for after-discharge care     Destination     [facility] .   Service: Skilled Nursing Contact information: [address] [place]  [address] [phone]                     Consultations: Neurosurgery GI  Procedures/Studies:  MR THORACIC SPINE W WO CONTRAST Result Date: 08/15/2024 EXAM: MRI  THORACIC SPINE WITH AND WITHOUT INTRAVENOUS CONTRAST 08/14/2024 11:40:05 PM TECHNIQUE: Multiplanar multisequence MRI of the thoracic spine was performed with and without the administration of intravenous contrast. COMPARISON: MRI of the thoracic spine dated 08/08/2024. CLINICAL HISTORY: Brain/CNS neoplasm, monitor. FINDINGS: BONES AND ALIGNMENT: Normal alignment. Normal vertebral body heights. Bone marrow signal is unremarkable, except for a hemangioma again noted within the L1 vertebral body. No abnormal enhancement. Since the previous study, the patient has undergone bilateral laminectomies at T2 and bilateral laminotomies at T1 and T3 for resection of an enhancing intramedullary lesion at the T2 vertebral body level. SPINAL CORD: The previously noted enhancing lesion at T1-T2 is no longer appreciated. There is persistent abnormal T2 signal present within the spinal cord extending from C6-C7 to the T8 vertebral body level. SOFT TISSUES: Unremarkable. DEGENERATIVE CHANGES: No significant disc herniation. No spinal canal stenosis or neural foraminal narrowing. IMPRESSION: 1. Status post bilateral laminectomies at T2  and bilateral laminotomies at T1 and T3 for resection of an enhancing intramedullary lesion at the T1-2 vertebral body level. 2. Persistent abnormal T2 signal within the spinal cord extending from C6-7 to the T8 vertebral body level. No residual enhancing lesion at T1-2. Electronically signed by: Evalene Coho MD 08/15/2024 04:58 AM EDT RP Workstation: [id number]   DG Thoracic Spine 2 View Result Date: 08/14/2024 EXAM: FLUOROSCOPIC IMAGES, 2 VIEWS TECHNIQUE: Fluoroscopy was provided by the radiology department for procedure. Radiologist was not present during examination. FLUOROSCOPY DOSE AND TYPE: Radiation Dose Index: Reference Air Kerma (in mGy) = 1.5 Fluoroscopy Time (in seconds) = 7.7 COMPARISON: None available. CLINICAL HISTORY: [id number] Surgery, elective [id number]. Dr. Darnella; T1-T3 LAM FOR  INTRADURAL MASS ; RSTO : CMP, HH; Fl time 7.7 seconds; mGy: 1.5 FINDINGS: Intraoperative fluoroscopic imaging during T1-3 laminectomy for mass resection. IMPRESSION: 1. Intraoperative fluoroscopic spot images, as above. NOTE: Intraoperative fluoroscopic spot images as above. Please refer to the intraoperative report for full details. Electronically signed by: Pinkie Pebbles MD 08/14/2024 08:49 PM EDT RP Workstation: [id number]   DG C-Arm 1-60 Min-No Report Result Date: 08/14/2024 Fluoroscopy was utilized by the requesting physician.  No radiographic interpretation.   DG C-Arm 1-60 Min-No Report Result Date: 08/14/2024 Fluoroscopy was utilized by the requesting physician.  No radiographic interpretation.   DG C-Arm 1-60 Min-No Report Result Date: 08/14/2024 Fluoroscopy was utilized by the requesting physician.  No radiographic interpretation.   US  Intraoperative Result Date: 08/14/2024 CLINICAL DATA:  Ultrasound was provided for use by the ordering physician.  No provider Interpretation or professional fees incurred.    MR ABDOMEN W WO CONTRAST Result Date: 08/12/2024 CLINICAL DATA:  Splenic mass EXAM: MRI ABDOMEN WITHOUT AND WITH CONTRAST TECHNIQUE: Multiplanar multisequence MR imaging of the abdomen was performed both before and after the administration of intravenous contrast. CONTRAST:  9.5mL GADAVIST GADOBUTROL 1 MMOL/ML IV SOLN COMPARISON:  08/08/2024 CT scan and report from MRI abdomen 07/18/2016 FINDINGS: Despite efforts by the technologist and patient, motion artifact is present on today's exam and could not be eliminated. This reduces exam sensitivity and specificity. Lower chest: Scarring or atelectasis in the right lower lobe. Hepatobiliary: Unremarkable Pancreas:  Unremarkable Spleen: Medially in the spleen, a 5.9 by 5.5 by 5.1 cm (volume = 87 cm^3) mass demonstrates arterial portal venous phase hyperenhancement relative to the rest of the spleen, and is only faintly hyperenhancing  on delayed phase 3 minute images. At 5 minutes postcontrast this appears to be enhancing similar to the rest of the spleen. The lesion is very minimally hyperintense to the splenic parenchyma although nearly isointense on T2 weighted images and similar to the rest of the spleen on diffusion-weighted images. The lesion is isointense to the spleen on precontrast T1 weighted images. Referring back to the 07/18/2016 exam this lesion is subtle and most conspicuous on arterial phase images where it measures 3.7 by 3.4 by 4.0 cm (volume = 26 cm^3). Adrenals/Urinary Tract:  Unremarkable Stomach/Bowel: Sigmoid colon diverticulosis. Vascular/Lymphatic:  Unremarkable Other:  No supplemental non-categorized findings. Musculoskeletal: Lumbar spondylosis and degenerative disc disease. IMPRESSION: 1. The splenic lesion is very faintly retrospectively detectable on the 07/18/2016 exam but has enlarged, currently averaging about 5.5 cm in diameter and previously about 3.7 cm on the exam from 8 years ago. 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 Periodic surveillance may be warranted as splenic hamartomas can cause symptoms if they grow large. 2. Sigmoid colon diverticulosis. 3. Lumbar spondylosis and degenerative disc disease. 4. Scarring  or atelectasis in the right lower lobe. Electronically Signed   By: Ryan Salvage M.D.   On: 08/12/2024 14:03   MR CERVICAL SPINE W WO CONTRAST Result Date: 08/09/2024 EXAM: MRI CERVICAL SPINE WITH AND WITHOUT CONTRAST 08/09/2024 02:43:55 PM TECHNIQUE: Multiplanar multisequence MRI of the cervical spine was performed without and with the administration of 9 mL of gadobutrol (GADAVIST) 1 MMOL/ML intravenous contrast. COMPARISON: MRI of the thoracic spine 08/08/2024. CLINICAL HISTORY: Metastatic disease evaluation. FINDINGS: BONES AND ALIGNMENT: Straightening of the normal cervical lordosis is present. Normal vertebral body heights. Marrow signal is unremarkable.  Chronic type 2 Modic changes are present at C4-C5 and C6-C7. No abnormal enhancement. SPINAL CORD: Intrinsic cord edema extends superiorly to the level of C1-C2. Subtle peripheral enhancement is present within an intramedullary lesion extending from T1-T2 through mid body of T1 measuring 5 x 17 mm. No other pathologic enhancement or cord enlargement is present. SOFT TISSUES: No paraspinal mass. C2-C3: Complex at C2-C3 effaces the ventral CSF. Severe right and moderate left foraminal stenosis is present. C3-C4: A broad-based disc osteophyte complex and facet hypertrophy contributes to moderate central and right greater than left foraminal narrowing. The central canal is narrowed 8 mm. C4-C5: A broad-based disc osteophyte complex effacement of the ventral CSF at C4-C5. Severe foraminal stenosis is present bilaterally. C5-C6: Broad-based disc osteophyte complex is asymmetric to the left at C5-C6. A central disc protrusion narrows the canal centrally to 7 mm. Severe left and moderate right foraminal stenosis is present. C6-C7: No significant disc herniation. No spinal canal stenosis or neural foraminal narrowing. C7-T1: No significant disc herniation. No spinal canal stenosis or neural foraminal narrowing. IMPRESSION: 1. Intrinsic cord edema extending superiorly to the level of C1-2. 2. Subtle peripheral enhancement within an intramedullary lesion extending from T1-2 through the mid body of T1 measuring 5 x 17 mm. 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 3. Moderate central canal narrowing at C3-4 with canal diameter of approximately 8 mm. 4. Central canal narrowing at C5-6 to approximately 7 mm due to a central disc protrusion. 5. Severe foraminal stenosis bilaterally at C4-5. 6. Severe left and moderate right foraminal stenosis at C5-6. 7. Severe right and moderate left foraminal stenosis at C2-3. Electronically signed by: Lonni Necessary MD 08/09/2024 03:05 PM EDT RP Workstation: [id number]   CT CHEST ABDOMEN PELVIS W CONTRAST Result Date: 08/08/2024 CLINICAL DATA:  Metastatic disease evaluation. History of spinal cord neoplasm. Weakness. EXAM: CT CHEST, ABDOMEN, AND PELVIS WITH CONTRAST TECHNIQUE: Multidetector CT imaging of the chest, abdomen and pelvis was performed following the standard protocol during bolus administration of intravenous contrast. RADIATION DOSE REDUCTION: This exam was performed according to the departmental dose-optimization program which includes automated exposure control, adjustment of the mA and/or kV according to patient size and/or use of iterative reconstruction technique. CONTRAST:  75mL OMNIPAQUE IOHEXOL 350 MG/ML SOLN COMPARISON:  CT abdomen and pelvis 08/08/2024. Chest radiographs 04/09/2024 FINDINGS: CT CHEST FINDINGS Cardiovascular: Normal heart size. No pericardial effusions. Normal caliber thoracic aorta. No aortic dissection. Great vessel origins are patent. Central pulmonary arteries are patent without evidence of significant pulmonary embolus. Mediastinum/Nodes: Esophagus is decompressed. No significant lymphadenopathy. Thyroid  gland is unremarkable. Lungs/Pleura: There is a small focal area of consolidation in the right lung base associated with an area of mucous plugging. This could represent focal pneumonia or aspiration. Focal scarring in the anterior left lung base. No significant pulmonary nodules or mass lesions  identified. No pleural effusion or pneumothorax. Musculoskeletal: Degenerative changes in the spine. No focal bone lesions. CT ABDOMEN PELVIS FINDINGS Hepatobiliary: Mild diffuse fatty infiltration of the liver. No focal lesions. Gallbladder and bile ducts are normal. Pancreas: Unremarkable. No pancreatic ductal dilatation or surrounding inflammatory changes. Spleen: There is a large mostly homogeneous hyperenhancing lesion centrally in the spleen, measuring 6.5 x 6.5 cm in  diameter. Adrenals/Urinary Tract: No adrenal gland nodules. Kidneys are symmetrical. Homogeneous nephrograms. No solid mass lesions. No hydronephrosis or hydroureter. Bladder is normal. Stomach/Bowel: Stomach, small bowel, and colon are not abnormally distended. No wall thickening or inflammatory stranding are appreciated. Appendix is normal. Diverticulosis of the sigmoid colon without evidence of acute diverticulitis. Vascular/Lymphatic: No significant vascular findings are present. No enlarged abdominal or pelvic lymph nodes. Reproductive: Prostate gland is enlarged, measuring 5.6 cm diameter. Other: No free air or free fluid in the abdomen. Abdominal wall musculature appears intact. Musculoskeletal: Degenerative changes in the spine. No focal bone lesions. IMPRESSION: 1. Focal area of infiltration associated with mucous plugging in the right lung base. This may represent aspiration or pneumonia. 2. No evidence of metastatic disease in the chest. 3. Large hyperenhancing lesion in the spleen. This was not present on the prior MRI abdomen from 07/18/2016. Differential diagnosis would include hemangioma, hamartoma, metastasis, or less likely lymphoma. Consider MRI for further evaluation. 4. No other possible metastatic lesions are identified in the abdomen or pelvis. 5. Prostate gland is enlarged. Electronically Signed   By: Elsie Gravely M.D.   On: 08/08/2024 15:35   MR THORACIC SPINE W WO CONTRAST Result Date: 08/08/2024 EXAM: MRI THORACIC SPINE WITH AND WITHOUT INTRAVENOUS CONTRAST 08/08/2024 12:40:09 PM TECHNIQUE: Multiplanar multisequence MRI of the thoracic spine was performed with and without the administration of intravenous contrast. 9 mL (gadobutrol (GADAVIST) 1 MMOL/ML injection 9 mL GADOBUTROL 1 MMOL/ML IV SOLN) was administered. COMPARISON: None available. CLINICAL HISTORY: Mid-back pain, neuro deficit. FINDINGS: BONES AND ALIGNMENT: Normal alignment. Normal vertebral body heights. An 11 mm  hemangioma is present superiorly at T12. Bone marrow signal is otherwise unremarkable. No other abnormal enhancement. SPINAL CORD: Mildly irregular intramedullary enhancement is present in the spinal cord from T1-2 through the mid body of T2 measuring 5 x 13 mm. Extensive cord edema extends superiorly to the highest image level, C6-7 and inferiorly to the level of T7. No other focal enhancement or cord enlargement is present. SOFT TISSUES: Unremarkable. DEGENERATIVE CHANGES: No significant focal disc protrusion or stenosis is present. No neural foraminal narrowing. IMPRESSION: 1. Mildly irregular intramedullary enhancement from T12 to mid T2 (5 x 13 mm) with extensive surrounding cord edema extending superiorly to C67 and inferiorly to T7. his could represent an intrinsic cord metastasis, primary spinal cord neoplasm is favored. This may represent an ependymoma or spinal cord astrocytoma. 2. No significant focal disc protrusion or stenosis. Electronically signed by: Lonni Necessary MD 08/08/2024 01:17 PM EDT RP Workstation: [id number]   MR Brain W and Wo Contrast Result Date: 08/08/2024 CLINICAL DATA:  Provided history: Metastatic disease evaluation. EXAM: MRI HEAD WITHOUT AND WITH CONTRAST TECHNIQUE: Multiplanar, multiecho pulse sequences of the brain and surrounding structures were obtained without and with intravenous contrast. CONTRAST:  9mL GADAVIST GADOBUTROL 1 MMOL/ML IV SOLN COMPARISON:  Head CT 08/08/2012. FINDINGS: The axial T1-weighted post-contrast sequence is moderate-to-severely motion degraded. The coronal T1-weighted post-contrast sequence is moderately motion degraded. Within these limitations, findings are as follows. Brain: Mild generalized cerebral atrophy. Multifocal T2 FLAIR hyperintense signal abnormality within the cerebral  white matter, nonspecific but compatible with minimal chronic small vessel ischemic disease. No cortical encephalomalacia is identified. There is no acute  infarct. No evidence of an intracranial mass. No chronic intracranial blood products. No extra-axial fluid collection. No midline shift. Within the limitations of motion degraded post-contrast imaging, there is no pathologic intracranial enhancement. Vascular: Maintained flow voids within the proximal large arterial vessels. Skull and upper cervical spine: No focal worrisome marrow lesion. Incompletely assessed cervical spondylosis. Partially imaged edema within the cervical spinal cord extending inferiorly from the C2 level. Sinuses/Orbits: No mass or acute finding within the imaged orbits. Moderate mucosal thickening within the right maxillary sinus. 18 mm mucous retention cyst, and mild background mucosal thickening, within the left maxillary sinus. Severe mucosal thickening within the right sphenoid sinus. Small fluid level within the left sphenoid sinus. Bilateral ethmoid sinusitis (mild right, moderate left). Moderate right frontal sinusitis. IMPRESSION: 1. Motion degraded post-contrast imaging as described. Within this limitation, there is no evidence of intracranial metastatic disease. 2. Minor chronic small vessel ischemic changes within the cerebral white matter. 3. Mild generalized cerebral atrophy. 4. Paranasal sinus disease as outlined within the body of the report. 5. Partially imaged edema within the cervical spinal cord extending inferiorly from the C2 level. Correlate with findings on same-day thoracic spine MRI and consider a dedicated cervical spine MRI (with and without contrast) for further evaluation. Electronically Signed   By: Rockey Childs D.O.   On: 08/08/2024 13:13   MR Lumbar Spine W Wo Contrast Result Date: 08/08/2024 EXAM: MRI LUMBAR SPINE 08/08/2024 12:43:22 PM TECHNIQUE: Multiplanar multisequence MRI of the lumbar spine was performed with and without the administration of 9 mL gadobutrol (GADAVIST) 1 MMOL/ML injection. COMPARISON: None available. CLINICAL HISTORY: Low back pain,  cauda equina syndrome suspected. FINDINGS: BONES AND ALIGNMENT: Straightening of the normal lumbar lordosis is present. Normal vertebral body heights. Bone marrow signal: Mixed edematous and chronic type 2 Modic changes are present at L2-L3. A small hemangioma is present at L1-L2. Chronic type 2 Modic changes are present posteriorly and asymmetrically on the right at L5-S1. Slight retrolisthesis at L5-S1 is stable. SPINAL CORD: The conus medullaris terminates at L1. SOFT TISSUES: No paraspinal mass. L1-L2: A small hemangioma is present at L1-L2. No significant disc herniation. No spinal canal stenosis or neural foraminal narrowing. L2-L3: Mixed edematous and chronic type 2 Modic changes are present at L2-L3. A broad-based disc protrusion and chronic loss of disc height is present at L2-L3. Moderate facet hypertrophy contributes to moderate foraminal narrowing bilaterally. L3-L4: Broad-based disc protrusion and moderate bilateral facet hypertrophy results in moderate right and mild left subarticular narrowing at L3-L4. Moderate foraminal stenosis is worse on the right. L4-L5: A broad-based disc protrusion is asymmetric to the left at L4-L5. Moderate facet hypertrophy is present. Moderate left-to-mild right subarticular narrowing is present. Moderate foraminal narrowing is present bilaterally. L5-S1: Slight retrolisthesis at L5-S1 is stable. Chronic type 2 Modic changes are present posteriorly and asymmetrically on the right at L5-S1. Facet hypertrophy contributes to moderate foraminal narrowing bilaterally. No significant disc herniation. No spinal canal stenosis. IMPRESSION: 1. No evidence of cauda equina syndrome. 2. Multilevel degenerative changes with moderate foraminal stenosis at L2-3 and L3-4, and moderate foraminal narrowing at L4-5 and L5-S1. Electronically signed by: Lonni Necessary MD 08/08/2024 01:08 PM EDT RP Workstation: [id number]   CT ABDOMEN PELVIS WO CONTRAST Result Date: 08/08/2024 CLINICAL  DATA:  Anuria. EXAM: CT ABDOMEN AND PELVIS WITHOUT CONTRAST TECHNIQUE: Multidetector CT imaging of the abdomen  and pelvis was performed following the standard protocol without IV contrast. RADIATION DOSE REDUCTION: This exam was performed according to the departmental dose-optimization program which includes automated exposure control, adjustment of the mA and/or kV according to patient size and/or use of iterative reconstruction technique. COMPARISON:  July 08, 2016. FINDINGS: Lower chest: No acute abnormality. Hepatobiliary: No focal liver abnormality is seen. No gallstones, gallbladder wall thickening, or biliary dilatation. Pancreas: Unremarkable. No pancreatic ductal dilatation or surrounding inflammatory changes. Spleen: Normal in size without focal abnormality. Adrenals/Urinary Tract: Adrenal glands and kidneys are unremarkable. No hydronephrosis or renal obstruction is noted. No renal or ureteral calculi are noted. Mild urinary bladder distention is noted. Stomach/Bowel: Stomach is within normal limits. Appendix appears normal. No evidence of bowel wall thickening, distention, or inflammatory changes. Moderate amount of stool seen throughout the colon. Vascular/Lymphatic: No significant vascular findings are present. No enlarged abdominal or pelvic lymph nodes. Reproductive: Mild prostatic enlargement is noted. Other: No abdominal wall hernia or abnormality. No abdominopelvic ascites. Musculoskeletal: No acute or significant osseous findings. IMPRESSION: 1. Mild urinary bladder distention is noted. 2. Mild prostatic enlargement. 3. Moderate stool burden. Electronically Signed   By: Lynwood Landy Raddle M.D.   On: 08/08/2024 08:15     Subjective: - no chest pain, shortness of breath, no abdominal pain, nausea or vomiting.   Discharge Exam: BP 119/64 (BP Location: Right Arm)   Pulse 78   Temp 97.8 F (36.6 C) (Oral)   Resp 17   Ht 5' 11 (1.803 m)   Wt 95.3 kg   SpO2 100%   BMI 29.30 kg/m    General: Pt is alert, awake, not in acute distress Cardiovascular: RRR, S1/S2 +, no rubs, no gallops Respiratory: CTA bilaterally, no wheezing, no rhonchi Abdominal: Soft, NT, ND, bowel sounds + Extremities: no edema, no cyanosis    The results of significant diagnostics from this hospitalization (including imaging, microbiology, ancillary and laboratory) are listed below for reference.     Microbiology: No results found for this or any previous visit (from the past 240 hours).   Labs: Basic Metabolic Panel: Recent Labs  Lab 08/19/24 1008 08/21/24 0124 08/22/24 0111  NA 130* 129* 127*  K 3.9 4.3 4.1  CL 93* 91* 91*  CO2 29 28 25   GLUCOSE 170* 144* 121*  BUN 22 22 19   CREATININE 0.87 0.82 0.83  CALCIUM  8.6* 8.5* 8.4*  MG  --   --  2.2   Liver Function Tests: Recent Labs  Lab 08/22/24 0111  AST 35  ALT 25  ALKPHOS 39  BILITOT 2.0*  PROT 6.0*  ALBUMIN 3.1*   CBC: Recent Labs  Lab 08/20/24 1420 08/20/24 1748 08/21/24 0003 08/22/24 0111  WBC 17.9*  --   --  16.8*  HGB 14.0 13.7 13.4 13.6  HCT 41.5 40.2 39.1 39.5  MCV 88.9  --   --  89.2  PLT 324  --   --  257   CBG: No results for input(s): GLUCAP in the last 168 hours. Hgb A1c No results for input(s): HGBA1C in the last 72 hours. Lipid Profile No results for input(s): CHOL, HDL, LDLCALC, TRIG, CHOLHDL, LDLDIRECT in the last 72 hours. Thyroid  function studies No results for input(s): TSH, T4TOTAL, T3FREE, THYROIDAB in the last 72 hours.  Invalid input(s): FREET3 Urinalysis    Component Value Date/Time   COLORURINE YELLOW 04/23/2015 1023   APPEARANCEUR CLEAR 04/23/2015 1023   LABSPEC 1.020 04/23/2015 1023   PHURINE 6.0 04/23/2015 1023  GLUCOSEU NEGATIVE 04/23/2015 1023   HGBUR NEGATIVE 04/23/2015 1023   BILIRUBINUR NEGATIVE 04/23/2015 1023   KETONESUR NEGATIVE 04/23/2015 1023   PROTEINUR NEGATIVE 04/23/2015 1023   UROBILINOGEN 0.2 04/23/2015 1023   NITRITE NEGATIVE  04/23/2015 1023   LEUKOCYTESUR NEGATIVE 04/23/2015 1023    FURTHER DISCHARGE INSTRUCTIONS:   Get Medicines reviewed and adjusted: Please take all your medications with you for your next visit with your Primary MD   Laboratory/radiological data: Please request your Primary MD to go over all hospital tests and procedure/radiological results at the follow up, please ask your Primary MD to get all Hospital records sent to his/her office.   In some cases, they will be blood work, cultures and biopsy results pending at the time of your discharge. Please request that your primary care M.D. goes through all the records of your hospital data and follows up on these results.   Also Note the following: If you experience worsening of your admission symptoms, develop shortness of breath, life threatening emergency, suicidal or homicidal thoughts you must seek medical attention immediately by calling 911 or calling your MD immediately  if symptoms less severe.   You must read complete instructions/literature along with all the possible adverse reactions/side effects for all the Medicines you take and that have been prescribed to you. Take any new Medicines after you have completely understood and accpet all the possible adverse reactions/side effects.    Do not drive when taking Pain medications or sleeping medications (Benzodaizepines)   Do not take more than prescribed Pain, Sleep and Anxiety Medications. It is not advisable to combine anxiety,sleep and pain medications without talking with your primary care practitioner   Special Instructions: If you have smoked or chewed Tobacco  in the last 2 yrs please stop smoking, stop any regular Alcohol  and or any Recreational drug use.   Wear Seat belts while driving.   Please note: You were cared for by a hospitalist during your hospital stay. Once you are discharged, your primary care physician will handle any further medical issues. Please note that NO  REFILLS for any discharge medications will be authorized once you are discharged, as it is imperative that you return to your primary care physician (or establish a relationship with a primary care physician if you do not have one) for your post hospital discharge needs so that they can reassess your need for medications and monitor your lab values.  Time coordinating discharge: 35 minutes  SIGNED:  Nilda Fendt, MD, PhD 08/24/2024, 9:42 AM

## 2024-08-24 NOTE — TOC Transition Note (Addendum)
 Transition of Care [facility]) - Discharge Note   Patient Details  Name: Dustin [name] MRN: [id number] Date of Birth: 10/07/1956  Transition of Care [facility]) CM/SW Contact:  Lauraine FORBES Saa, LCSWA Phone Number: 08/24/2024, 12:15 PM   Clinical Narrative:     Patient will DC to: [name] Anticipated DC date: 08/24/24 Family notified: [name]; Sister; [phone] Transport by: [facility]   CSW informed medical team and SNF that patient's SNF insurance authorization was approved and is valid  08/23/2024-08/26/2024. Per MD patient ready for DC to [facility]. RN to call report prior to discharge [phone]). RN, patient, patient's family, and facility notified of DC. Discharge Summary and FL2 sent to facility. DC packet on chart. Ambulance transport requested for patient at 11:34  CSW will sign off for now as social work intervention is no longer needed. Please consult us  again if new needs arise.    Final next level of care: Skilled Nursing Facility Barriers to Discharge: Barriers Resolved   Patient Goals and CMS Choice            Discharge Placement              Patient chooses bed at: [name] Patient to be transferred to facility by: [facility] Name of family member notified: [name]; Sister; [phone] Patient and family notified of of transfer: 08/24/24  Discharge Plan and Services Additional resources added to the After Visit Summary for                                       Social Drivers of Health (SDOH) Interventions SDOH Screenings   Food Insecurity: No Food Insecurity (05/01/2024)  Housing: Unknown (05/01/2024)  Transportation Needs: No Transportation Needs (05/01/2024)  Utilities: Not At Risk (03/25/2024)  Alcohol Screen: Low Risk  (05/01/2024)  Depression (PHQ2-9): Low Risk  (08/05/2024)  Financial Resource Strain: Low Risk  (05/01/2024)  Physical Activity: Sufficiently Active (05/01/2024)  Social Connections: Moderately  Isolated (05/01/2024)  Stress: No Stress Concern Present (05/01/2024)  Tobacco Use: Low Risk  (08/14/2024)  Health Literacy: Adequate Health Literacy (03/25/2024)     Readmission Risk Interventions     No data to display

## 2024-08-24 NOTE — Progress Notes (Signed)
 Report called to V[place], CHARITY FUNDRAISER at [facility] [phone]. Social worker advised [facility] will be here within 1-2 hours. Pt updated, lines removed, and AVS will be printed.

## 2024-08-25 ENCOUNTER — Inpatient Hospital Stay: Attending: Internal Medicine

## 2024-08-25 ENCOUNTER — Inpatient Hospital Stay

## 2024-08-25 ENCOUNTER — Encounter: Payer: Self-pay | Admitting: Genetic Counselor

## 2024-08-25 DIAGNOSIS — R2689 Other abnormalities of gait and mobility: Secondary | ICD-10-CM | POA: Diagnosis not present

## 2024-08-25 DIAGNOSIS — Z48811 Encounter for surgical aftercare following surgery on the nervous system: Secondary | ICD-10-CM | POA: Diagnosis not present

## 2024-08-25 DIAGNOSIS — M6281 Muscle weakness (generalized): Secondary | ICD-10-CM | POA: Diagnosis not present

## 2024-08-25 LAB — SURGICAL PATHOLOGY

## 2024-08-25 NOTE — Telephone Encounter (Signed)
 Letter mailed

## 2024-08-28 NOTE — Telephone Encounter (Signed)
 Letter mailed and 3 yr colon recall placed

## 2024-09-23 ENCOUNTER — Other Ambulatory Visit: Payer: Self-pay | Admitting: Family Medicine

## 2024-09-23 DIAGNOSIS — E785 Hyperlipidemia, unspecified: Secondary | ICD-10-CM

## 2024-10-17 ENCOUNTER — Encounter (HOSPITAL_COMMUNITY): Payer: Self-pay

## 2024-10-17 ENCOUNTER — Ambulatory Visit (HOSPITAL_COMMUNITY): Attending: Internal Medicine | Admitting: Internal Medicine

## 2024-10-17 NOTE — Progress Notes (Incomplete)
 "   Primary Care Physician: Delbert Clam, MD Primary Cardiologist: None Electrophysiologist: None     Referring Physician: ED     Dustin [name] is a 68 y.o. male with a history of HTN, GERD, cirrhosis, cough, and atrial fibrillation who presents for consultation in the [facility]. ED visit on 04/09/24 for emesis found to be in new Afib. Patient is on Eliquis  5 mg BID for a CHADS2VASC score of 2.  Follow-up 10/17/2024.  Patient is currently in atrial flutter.  He does not appear to have cardiac awareness.  Review of outside records show hospital admission in November for acute PE.  No bleeding issues on Eliquis .  He has not missed any doses of Eliquis .  Today, he denies symptoms of palpitations, chest pain, shortness of breath, orthopnea, PND, lower extremity edema, dizziness, presyncope, syncope, snoring, daytime somnolence, bleeding, or neurologic sequela. The patient is tolerating medications without difficulties and is otherwise without complaint today.   he has a BMI of There is no height or weight on file to calculate BMI.. There were no vitals filed for this visit.   Current Outpatient Medications  Medication Sig Dispense Refill   acetaminophen  (TYLENOL ) 500 MG tablet Take 1 tablet (500 mg total) by mouth every 6 (six) hours as needed. (Patient taking differently: Take 500 mg by mouth every 6 (six) hours as needed for mild pain (pain score 1-3) or moderate pain (pain score 4-6) (tooth pains).) 30 tablet 0   albuterol  (VENTOLIN  HFA) 108 (90 Base) MCG/ACT inhaler INHALE 2 PUFFS INTO THE LUNGS EVERY 6 HOURS AS NEEDED FOR WHEEZING OR SHORTNESS OF BREATH 17 g 1   amLODipine  (NORVASC ) 10 MG tablet Take 1 tablet (10 mg total) by mouth daily. 90 tablet 1   atorvastatin  (LIPITOR) 20 MG tablet TAKE 1 TABLET(20 MG) BY MOUTH DAILY 90 tablet 0   carvedilol  (COREG ) 6.25 MG tablet TAKE 1 TABLET(6.25 MG) BY MOUTH TWICE DAILY WITH A MEAL 180 tablet 1   cetirizine   (ZYRTEC ) 10 MG tablet Take 1 tablet (10 mg total) by mouth daily. (Patient taking differently: Take 10 mg by mouth as needed for allergies.) 90 tablet 1   diclofenac  Sodium (VOLTAREN ) 1 % GEL APPLY 4 GRAMS EXTERNALLY TO THE AFFECTED AREA FOUR TIMES DAILY (Patient taking differently: Apply 4 g topically as needed. APPLY 4 GRAMS EXTERNALLY TO THE AFFECTED AREA FOUR TIMES DAILY) 100 g 2   fluticasone  (FLONASE ) 50 MCG/ACT nasal spray PLACE 1 SPRAY IN BOTH NOSTRILS DAILY (Patient taking differently: Place 1 spray into both nostrils as needed for allergies.) 48 g 1   omeprazole  (PRILOSEC) 20 MG capsule TAKE 1 CAPSULE(20 MG) BY MOUTH DAILY 90 capsule 1   oxyCODONE -acetaminophen  (PERCOCET) 7.5-325 MG tablet Take 1 tablet by mouth every 4 (four) hours as needed for moderate pain (pain score 4-6) or severe pain (pain score 7-10). 12 tablet 0   No current facility-administered medications for this visit.    Atrial Fibrillation Management history:  Previous antiarrhythmic drugs: none Previous cardioversions: none Previous ablations: none Anticoagulation history: Eliquis    ROS- All systems are reviewed and negative except as per the HPI above.  Physical Exam: There were no vitals taken for this visit.  GEN- The patient is well appearing, alert and oriented x 3 today.   Neck - no JVD or carotid bruit noted Lungs- Clear to ausculation bilaterally, normal work of breathing Heart- ***Regular rate and rhythm, no murmurs, rubs or gallops, PMI not laterally displaced Extremities-  no clubbing, cyanosis, or edema Skin - no rash or ecchymosis noted   EKG today demonstrates  ***  Echo 05/16/2024: 1. Systolic anterior motion of the mitral valve, severe LVH, mild MR,  Peak LVOT gradient while in Afib (Evalute for HCM, cardiac  amyloidosis (apical sparing noted on strain imaging), or other  infiltrative cardiomyopathy). . Left ventricular  ejection fraction, by estimation, is >75%. The left  ventricle has  hyperdynamic function. The left ventricle has no regional wall motion  abnormalities. There is severe left ventricular hypertrophy. Left  ventricular diastolic function could not be  evaluated. The average left ventricular global longitudinal strain is  -10.2 %. The global longitudinal strain is abnormal.   2. Right ventricular systolic function is normal. The right ventricular  size is normal. There is normal pulmonary artery systolic pressure.   3. Left atrial size was mildly dilated.   4. The mitral valve is grossly normal. Mild mitral valve regurgitation.  No evidence of mitral stenosis.   5. The aortic valve is normal in structure. Aortic valve regurgitation is  not visualized. No aortic stenosis is present.   ASSESSMENT & PLAN CHA2DS2-VASc Score = 2  The patient's score is based upon: CHF History: 0 HTN History: 1 Diabetes History: 0 Stroke History: 0 Vascular Disease History: 0 Age Score: 1 Gender Score: 0       ASSESSMENT AND PLAN: Persistent Atrial Flutter(ICD10:  I48.19) The patient's CHA2DS2-VASc score is 2, indicating a 2.2% annual risk of stroke.    He is currently in atrial flutter. Education provided about arrhythmia and discussion on triggers. Discussion about cardioversion procedure to try to restore normal rhythm. Patient is very hesitant to proceed with cardioversion and does not want to do procedure. I discussed with patient the potential risks of procedure but also went over the benefit of feeling better in normal rhythm. I discussed with patient his echocardiogram results in 2016 and how it would be very beneficial to obtain a repeat echocardiogram and have him establish with cardiologist regarding severe LVH with outflow tract obstruction. Will order echocardiogram and refer to Cardiology.   Secondary Hypercoagulable State (ICD10:  D68.69) The patient is at significant risk for stroke/thromboembolism based upon his CHA2DS2-VASc Score of 2.   Continue Apixaban  (Eliquis ).  No missed doses of Eliquis .  Follow-up***.   Terra Pac, PA-C  [facility] [address] [place], [place] [address] [phone]  "

## 2024-11-04 ENCOUNTER — Telehealth: Payer: Self-pay | Admitting: Family Medicine

## 2024-11-04 NOTE — Telephone Encounter (Signed)
 Contacted pt left vm to confirmed appt

## 2024-11-05 ENCOUNTER — Ambulatory Visit: Admitting: Family Medicine

## 2024-11-06 NOTE — Progress Notes (Signed)
 ------------------------------------------------------------------------------- Attestation signed by [name], MD at 11/10/2024  8:37 AM I saw and evaluated the patient. Discussed with resident and agree with resident's findings and plan as documented in the resident's note.  [name], MD   -------------------------------------------------------------------------------  Infectious Disease Clinic Note  Clinic date: 11/07/2024   Subjective:   Dustin [name] is a 69 y.o. male with a PMH of HTN, HLD, hep C, liver cirrhosis, pAF and DVT on Eliquis , GI carcinoid tumor, remote history of ETOH use  who presents to clinic today for a hospital follow up visit after a recent admission from 11/7-11/13/2025 for recurrent fevers in the setting of a sacral wound ulcer and an acute PE on Eliquis  (though not on medication list currently).  The patient's aforementioned admission was complicated in that there were multiple potential sources of fever including an acute PE, CAP, a sacral decubitus wound debridement by EGS on 09/09/2024. The patient was treated with broad spectrum antibiotics during their admission and this was narrowed to Vancomycin and Ceftriaxone  for the last three days of their admission. This was transitioned to Linezolid 600 mg BID and Ciprofloxacin 750 mg BID to complete a two week course for complicates SSTI. The patient finished this course to completion and felt that their wound did well healed up well from what he can recall.   Patient also had reported lower back pain which was lancinating up his spine. He had an MRI Spine performed which did now show any acute findings such as sacral OM. There were some fluid collections seen at the site of his recent resection of intramedullary spinal mass with T1-T3 laminectomy from back in October 2025.   The patient has remained afebrile and feeling well overall since his discharge from his most recent admission. Notably the patient  has canceled or no-showed three times in the month of December 2025 for which he has not been able to be reached in several weeks. The CNA who accompanies the patient does not know why this is the case.   The patient appears to be disoriented at baseline only able to tell me his name consistently. He does not know why his facility restarted him on Levfloxacin on 1/5 with planned course for 7 days, EOT 1/12. It says on his sheet that it is fore his sacral wound but there is no other information about his local wound care at the facility.      MICRO  Respiratory Culture 11/10- Normal mixed respiratory flora Blood culture 11/7- NG Urine culture 11/7- NG   PHYSICAL EXAM:  Const: well appearing Eyes: no scleral icterus Cardio: no murmur noted Resp: CTAB GI: NTND MSK: no edema or deformities Neuro: nonfocal Skin: See images below   Media Information   11/07/2024   Psych: A&O x1, baseline reportedly per CNA who accmpanies patiertn  Vitals:   11/07/24 0936  BP: 109/60  Pulse: 98  Resp: 16  Temp: 97.9 F (36.6 C)  SpO2: 99%     LABS:  CBC:   , DIFF:    and CMP:      Inflammatory Markers Sedimentation Rate  Date Value Ref Range Status  09/05/2024 30 (H) 0 - 20 mm/hr Final    No results found for: CRP  IMAGING:     ASSESSMENT & PLAN:   Dustin [name] is a 69 y.o. 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      #Recurrent Fevers, Resolved #Sacral Wound s/p Debridement on 09/09/2024 - CT CAP 09/05/24 with acute PE, possible hematoma in the spleen, infarct vs pyelonephritis of the left kidney, decubitus ulcer, small presacral  fluid and strandings in prevertebral tissue, mild tree in bud nodularity in the LLL - noncontrasted MRI total spine with no osteomyelitis, fluid collections in the laminectomy defect and posterior paraspinal soft tissues at T2 reportedly not unexpected postoperatively - urine culture NG, blood cultures NG, COVID/flu/RSV negative from admission in November 2025 -The patient's sacral wound appears to be healing, though is deep and thus will require persistent/aggressive local wound care. Therefore, we would not recommend continuing his Levofloxacin as he has already received 5 days which is adequate duration for a simple SSTI.   PLAN: -Discontinue Levofloxacin PO -Continue local wound care at facility -RTC PRN  Patient's care plan was discussed with ID attending physician, Dr. [name].   Fonda Splinter, MD Infectious Diseases Fellow, PGY-4 [facility] of Medicine [email or web address]

## 2025-03-31 ENCOUNTER — Ambulatory Visit
